# Patient Record
Sex: Female | Born: 1935 | Race: White | Hispanic: No | Marital: Single | Smoking: Never smoker
Health system: Southern US, Community
[De-identification: ages and names within clinical notes are randomized; demographics above are authoritative.]

## PROBLEM LIST (undated history)

## (undated) DIAGNOSIS — F32A Depression, unspecified: Secondary | ICD-10-CM

## (undated) DIAGNOSIS — K219 Gastro-esophageal reflux disease without esophagitis: Secondary | ICD-10-CM

## (undated) DIAGNOSIS — R200 Anesthesia of skin: Secondary | ICD-10-CM

## (undated) DIAGNOSIS — G9340 Encephalopathy, unspecified: Secondary | ICD-10-CM

## (undated) DIAGNOSIS — Z923 Personal history of irradiation: Secondary | ICD-10-CM

## (undated) DIAGNOSIS — R194 Change in bowel habit: Secondary | ICD-10-CM

## (undated) DIAGNOSIS — F419 Anxiety disorder, unspecified: Secondary | ICD-10-CM

## (undated) DIAGNOSIS — E7800 Pure hypercholesterolemia, unspecified: Secondary | ICD-10-CM

## (undated) DIAGNOSIS — J383 Other diseases of vocal cords: Secondary | ICD-10-CM

## (undated) DIAGNOSIS — A419 Sepsis, unspecified organism: Secondary | ICD-10-CM

## (undated) DIAGNOSIS — E876 Hypokalemia: Secondary | ICD-10-CM

## (undated) DIAGNOSIS — F329 Major depressive disorder, single episode, unspecified: Secondary | ICD-10-CM

## (undated) DIAGNOSIS — R6521 Severe sepsis with septic shock: Secondary | ICD-10-CM

## (undated) DIAGNOSIS — I2609 Other pulmonary embolism with acute cor pulmonale: Secondary | ICD-10-CM

## (undated) DIAGNOSIS — I2699 Other pulmonary embolism without acute cor pulmonale: Secondary | ICD-10-CM

## (undated) DIAGNOSIS — D4959 Neoplasm of unspecified behavior of other genitourinary organ: Secondary | ICD-10-CM

## (undated) DIAGNOSIS — W19XXXA Unspecified fall, initial encounter: Secondary | ICD-10-CM

## (undated) DIAGNOSIS — C801 Malignant (primary) neoplasm, unspecified: Secondary | ICD-10-CM

## (undated) DIAGNOSIS — Z85828 Personal history of other malignant neoplasm of skin: Secondary | ICD-10-CM

## (undated) DIAGNOSIS — A0472 Enterocolitis due to Clostridium difficile, not specified as recurrent: Secondary | ICD-10-CM

## (undated) DIAGNOSIS — R32 Unspecified urinary incontinence: Secondary | ICD-10-CM

## (undated) DIAGNOSIS — E039 Hypothyroidism, unspecified: Secondary | ICD-10-CM

## (undated) DIAGNOSIS — I10 Essential (primary) hypertension: Secondary | ICD-10-CM

## (undated) DIAGNOSIS — C20 Malignant neoplasm of rectum: Secondary | ICD-10-CM

## (undated) DIAGNOSIS — L719 Rosacea, unspecified: Secondary | ICD-10-CM

## (undated) DIAGNOSIS — Z87440 Personal history of urinary (tract) infections: Secondary | ICD-10-CM

## (undated) DIAGNOSIS — K6289 Other specified diseases of anus and rectum: Secondary | ICD-10-CM

## (undated) DIAGNOSIS — M1990 Unspecified osteoarthritis, unspecified site: Secondary | ICD-10-CM

## (undated) DIAGNOSIS — N390 Urinary tract infection, site not specified: Secondary | ICD-10-CM

## (undated) HISTORY — DX: Other pulmonary embolism without acute cor pulmonale: I26.99

## (undated) HISTORY — PX: OVARIAN CYST SURGERY: SHX726

## (undated) HISTORY — PX: ABDOMINAL HYSTERECTOMY: SHX81

## (undated) HISTORY — PX: CATARACT EXTRACTION: SUR2

## (undated) HISTORY — DX: Personal history of irradiation: Z92.3

## (undated) SURGERY — Surgical Case
Anesthesia: *Unknown

---

## 1999-05-14 ENCOUNTER — Encounter: Payer: Self-pay | Admitting: Internal Medicine

## 1999-05-14 ENCOUNTER — Encounter: Admission: RE | Admit: 1999-05-14 | Discharge: 1999-05-14 | Payer: Self-pay | Admitting: Family Medicine

## 1999-09-04 ENCOUNTER — Other Ambulatory Visit: Admission: RE | Admit: 1999-09-04 | Discharge: 1999-09-04 | Payer: Self-pay | Admitting: Internal Medicine

## 2000-01-28 ENCOUNTER — Emergency Department (HOSPITAL_COMMUNITY): Admission: EM | Admit: 2000-01-28 | Discharge: 2000-01-28 | Payer: Self-pay | Admitting: Emergency Medicine

## 2000-09-24 ENCOUNTER — Emergency Department (HOSPITAL_COMMUNITY): Admission: EM | Admit: 2000-09-24 | Discharge: 2000-09-24 | Payer: Self-pay | Admitting: Internal Medicine

## 2000-09-28 ENCOUNTER — Encounter: Admission: RE | Admit: 2000-09-28 | Discharge: 2000-09-28 | Payer: Self-pay | Admitting: Internal Medicine

## 2000-09-28 ENCOUNTER — Encounter: Payer: Self-pay | Admitting: Internal Medicine

## 2002-06-13 ENCOUNTER — Encounter: Payer: Self-pay | Admitting: Internal Medicine

## 2002-06-13 ENCOUNTER — Encounter: Admission: RE | Admit: 2002-06-13 | Discharge: 2002-06-13 | Payer: Self-pay | Admitting: Internal Medicine

## 2002-08-04 ENCOUNTER — Other Ambulatory Visit: Admission: RE | Admit: 2002-08-04 | Discharge: 2002-08-04 | Payer: Self-pay | Admitting: Internal Medicine

## 2004-01-17 ENCOUNTER — Encounter: Admission: RE | Admit: 2004-01-17 | Discharge: 2004-01-17 | Payer: Self-pay | Admitting: Internal Medicine

## 2004-04-21 ENCOUNTER — Inpatient Hospital Stay (HOSPITAL_COMMUNITY): Admission: RE | Admit: 2004-04-21 | Discharge: 2004-04-23 | Payer: Self-pay | Admitting: Obstetrics and Gynecology

## 2004-04-21 ENCOUNTER — Encounter (INDEPENDENT_AMBULATORY_CARE_PROVIDER_SITE_OTHER): Payer: Self-pay | Admitting: Specialist

## 2006-09-05 ENCOUNTER — Encounter: Admission: RE | Admit: 2006-09-05 | Discharge: 2006-09-05 | Payer: Self-pay | Admitting: Internal Medicine

## 2006-10-02 ENCOUNTER — Encounter (INDEPENDENT_AMBULATORY_CARE_PROVIDER_SITE_OTHER): Payer: Self-pay | Admitting: Emergency Medicine

## 2006-10-02 ENCOUNTER — Ambulatory Visit: Payer: Self-pay | Admitting: Vascular Surgery

## 2006-10-02 ENCOUNTER — Emergency Department (HOSPITAL_COMMUNITY): Admission: EM | Admit: 2006-10-02 | Discharge: 2006-10-02 | Payer: Self-pay | Admitting: Emergency Medicine

## 2007-03-11 ENCOUNTER — Inpatient Hospital Stay (HOSPITAL_COMMUNITY): Admission: EM | Admit: 2007-03-11 | Discharge: 2007-03-14 | Payer: Self-pay | Admitting: Emergency Medicine

## 2007-03-28 ENCOUNTER — Encounter: Admission: RE | Admit: 2007-03-28 | Discharge: 2007-04-27 | Payer: Self-pay | Admitting: Internal Medicine

## 2009-09-20 ENCOUNTER — Ambulatory Visit: Payer: Self-pay | Admitting: Interventional Radiology

## 2009-09-20 ENCOUNTER — Emergency Department (HOSPITAL_BASED_OUTPATIENT_CLINIC_OR_DEPARTMENT_OTHER): Admission: EM | Admit: 2009-09-20 | Discharge: 2009-09-20 | Payer: Self-pay | Admitting: Emergency Medicine

## 2009-10-12 ENCOUNTER — Ambulatory Visit (HOSPITAL_BASED_OUTPATIENT_CLINIC_OR_DEPARTMENT_OTHER): Admission: RE | Admit: 2009-10-12 | Discharge: 2009-10-12 | Payer: Self-pay | Admitting: Internal Medicine

## 2009-10-12 ENCOUNTER — Ambulatory Visit: Payer: Self-pay | Admitting: Diagnostic Radiology

## 2009-11-11 ENCOUNTER — Encounter: Admission: RE | Admit: 2009-11-11 | Discharge: 2009-11-11 | Payer: Self-pay | Admitting: Internal Medicine

## 2010-01-09 ENCOUNTER — Encounter
Admission: RE | Admit: 2010-01-09 | Discharge: 2010-01-09 | Payer: Self-pay | Source: Home / Self Care | Admitting: Internal Medicine

## 2010-03-25 ENCOUNTER — Other Ambulatory Visit: Payer: Self-pay | Admitting: Internal Medicine

## 2010-03-25 DIAGNOSIS — R1900 Intra-abdominal and pelvic swelling, mass and lump, unspecified site: Secondary | ICD-10-CM

## 2010-04-07 ENCOUNTER — Other Ambulatory Visit: Payer: Self-pay

## 2010-04-08 ENCOUNTER — Other Ambulatory Visit: Payer: Self-pay | Admitting: Internal Medicine

## 2010-04-08 DIAGNOSIS — R1900 Intra-abdominal and pelvic swelling, mass and lump, unspecified site: Secondary | ICD-10-CM

## 2010-04-14 ENCOUNTER — Ambulatory Visit
Admission: RE | Admit: 2010-04-14 | Discharge: 2010-04-14 | Disposition: A | Discharge status: Home / Self Care | Payer: BC Managed Care – PPO | Source: Ambulatory Visit | Attending: Internal Medicine | Admitting: Internal Medicine

## 2010-04-14 DIAGNOSIS — R1900 Intra-abdominal and pelvic swelling, mass and lump, unspecified site: Secondary | ICD-10-CM

## 2010-04-25 LAB — POCT CARDIAC MARKERS
Myoglobin, poc: 78.4 ng/mL (ref 12–200)
Troponin i, poc: <0.05 ng/mL (ref 0.00–0.09)

## 2010-04-25 LAB — CBC
HCT: 42.5 % (ref 36.0–46.0)
Hemoglobin: 14.2 g/dL (ref 12.0–15.0)
MCH: 30.9 pg (ref 26.0–34.0)

## 2010-04-25 LAB — URINALYSIS, ROUTINE W REFLEX MICROSCOPIC
Bilirubin Urine: NEGATIVE
Ketones, ur: 40 mg/dL — AB
Protein, ur: NEGATIVE mg/dL
Urobilinogen, UA: 1 mg/dL (ref 0.0–1.0)
pH: 6 (ref 5.0–8.0)

## 2010-04-25 LAB — URINE CULTURE
Colony Count: >=100000
Culture  Setup Time: 201108122110

## 2010-04-25 LAB — COMPREHENSIVE METABOLIC PANEL
ALT: 19 U/L (ref 0–35)
AST: 24 U/L (ref 0–37)
Alkaline Phosphatase: 97 U/L (ref 39–117)
CO2: 29 mEq/L (ref 19–32)
Glucose, Bld: 113 mg/dL — ABNORMAL HIGH (ref 70–99)
Sodium: 141 mEq/L (ref 135–145)

## 2010-04-25 LAB — URINE MICROSCOPIC-ADD ON

## 2010-04-25 LAB — DIFFERENTIAL: Basophils Relative: 1 % (ref 0–1)

## 2010-04-25 LAB — LACTIC ACID, PLASMA: Lactic Acid, Venous: 1.1 mmol/L (ref 0.5–2.2)

## 2010-06-24 NOTE — H&P (Signed)
NAME:  Bethany [name], TINNER NO.:  [id number]   MEDICAL RECORD NO.:  [id number]          PATIENT TYPE:  OBV   LOCATION:  5011                         FACILITY:  [facility]   PHYSICIAN:  Della Goo, M.D. DATE OF BIRTH:  04-15-1928   DATE OF ADMISSION:  03/11/2007  DATE OF DISCHARGE:                              HISTORY & PHYSICAL   PRIMARY CARE PHYSICIAN:  Dr. Selena Batten   CHIEF COMPLAINT:  Fall.   HISTORY OF PRESENT ILLNESS:  This is a 77 year old female who was  brought to the emergency department after suffering a fall and hitting  the back of her head on a brick wall.  She suffered a laceration to the  posterior scalp area.  The patient denies having any loss of  consciousness.  She denies having any dizziness or chest pain prior to  the event.  She reports tripping in the garage while she was trying to  help her nephew and fell backwards, hitting the brick wall.  She denies  having any current nausea, vomiting, dizziness.  She does report having  pain at the site of the wound.   The patient was seen and evaluated in the emergency department by the  emergency department physician and her wound was evaluated and she had a  CAT scan performed of the head which was negative.  The laceration was  apposed with suture staples and the patient was referred for 24-hour  observation/admission.   PAST MEDICAL HISTORY:  Significant for:  1. Hypertension.  2. Migraine headaches.  3. Rosacea.  4. Bilateral cataracts.  5. Arthritis.   PAST SURGICAL HISTORY:  History of right ovarian cyst removal secondary  to findings of a benign cyst.   MEDICATIONS:  Include Inderal 120 mg one p.o. daily.   She has allergies to:  1. CODEINE.  2. CEPHALOSPORINS.  3. ERYTHROMYCIN.   SOCIAL HISTORY:  She lives with her nephew.  She is a nonsmoker,  nondrinker.   FAMILY HISTORY:  Positive for coronary artery disease in her father and  her sister, positive for stroke in her father, and  positive for cancer  in her mother - this was a brain cancer.   REVIEW OF SYSTEMS:  Pertinents are mentioned above.   PHYSICAL EXAMINATION FINDINGS:  This is a pleasant 77 year old obese  female in discomfort but no acute distress.  VITAL SIGNS:  Temperature 97.0, blood pressure 188/92, heart rate 68,  respirations 20.  HEENT EXAMINATION:  Normocephalic.  Positive posterior scalp area  laceration approximately 5 cm in length which is currently apposed with  suture staples.  There is edema around the surrounding area and an  ecchymosis.  Pupils are equally round, reactive to light.  Extraocular  muscles are intact.  Oropharynx is clear.  NECK:  Supple, full range of motion.  No thyromegaly, adenopathy,  jugular venous distention.  CARDIOVASCULAR:  Regular rate and rhythm.  No murmurs, gallops or rubs.  LUNGS:  Clear to auscultation bilaterally.  ABDOMEN:  Positive bowel sounds, soft, nontender, nondistended.  EXTREMITIES:  Without cyanosis, clubbing or edema.  NEUROLOGIC  EXAMINATION:  Nonfocal.   LABORATORY STUDIES:  I-STAT performed:  Hemoglobin 13.6, hematocrit  40.0, sodium 141, potassium 3.1, chloride 105 bicarb 27, creatinine 0.8,  and glucose 95.  CT scan of the brain is negative for any intracranial  abnormalities.  Chronic small-vessel disease present.  CT scan of the C-  spine reveals no acute fractures, positive osteoarthritis changes  present throughout C-spine, without evidence of fracture or subluxation.   ASSESSMENT:  A 77 year old female being admitted with:  1. Concussion syndrome status post fall.  2. Scalp laceration.  3. Mild hypokalemia.  4. Hypertension, most probably exacerbated by her pain and injury at      this time.   PLAN:  The patient will be admitted for 23-hour observation.  She will  be placed on neurologic checks, pain control therapy, IV fluids.  Her  regular medications will also be continued.  Further evaluation will  ensue pending her clinical  course.  If her condition does change she  will be changed to admission status.  The patient has been placed on DVT  and GI prophylaxis at this time.      Della Goo, M.D.  Electronically Signed     HJ/MEDQ  D:  03/12/2007  T:  03/12/2007  Job:  [id number]   cc:   Massie Maroon, MD

## 2010-06-24 NOTE — Discharge Summary (Signed)
NAME:  Bethany [name], BARRESI NO.:  [id number]   MEDICAL RECORD NO.:  [id number]          PATIENT TYPE:  OBV   LOCATION:  5011                         FACILITY:  [facility]   PHYSICIAN:  Lucita Ferrara, MD         DATE OF BIRTH:  01-25-1929   DATE OF ADMISSION:  03/11/2007  DATE OF DISCHARGE:  03/14/2007                               DISCHARGE SUMMARY   PRIMARY CARE DOCTOR:  Massie Maroon, MD   ADMISSION DIAGNOSES:  1. Concussion syndrome, status post fall.  2. Scalp laceration.  3. Mild hypokalemia.  4. Hypertension.   DISCHARGE DIAGNOSES:  1. Dizziness secondary to non-central causes.  2. Scalp laceration.  3. Hypokalemia.  4. Chronic compensated diastolic dysfunction.  5. Deconditioning.   CONSULTANTS:  None.   PROCEDURES:  The patient had a CT scan initially when she presented on  January 30, which showed no acute intracranial abnormalities.  A repeat  CT scan was again done on March 13, 2007, which confirmed that  diagnosis.   BRIEF HISTORY OF PRESENT ILLNESS:  Ms. Bethany [name] is a 77 year old female  who presented to [facility] on March 11, 2007, after  sustaining a fall and hitting the back of her head at a brick wall.  She  sustained a laceration in the posterior scalp.  She denied any prodrome  such as dizziness, chest pain, shortness of breath, seizure activity,  loss of bowel or bladder function.  She came to the emergency room, was  evaluated, CT scan as above negative.  Laceration was sutured with  staples.  She was admitted for observation.  A repeat CT scan was  negative.  However, her dizziness persisted.  My suspicion is that the  dizziness was the initial culprit for the fall as the patient describes  that she has chronic vertigo.  In this regard, physical therapy and  occupational therapy were called for evaluation.  The patient is less  dizzy as time goes on.  She is able to ambulate better and she will most  likely benefit from PT for  cochlear rehabilitation as an outpatient.   DISCHARGE INSTRUCTIONS:  The patient will be discharged to home with her  son.   The patient will be on the following medications, including:  1. She was given a prescription for Antivert 25 mg q.6h. p.r.n.      dizziness.  2. She is to resume her Inderal 120 mg p.o. daily for migraine      headache prophylaxis.   Note that she is allergic to CODEINE, CEPHALOSPORINS and ERYTHROMYCIN.   She will be set up with home PT for cochlear exercises.  The patient is  instructed to call PMD or present to emergency room with changes in her  neurological status, numbness, tingling, speech deficit or signs or  symptoms of stroke.  If her dizziness continues, the patient is  recommended to follow up with neurology and/or ENT.  The patient may  benefit from MRI/MRA to rule out any central causes of dizziness  including acoustic neuroma or  Schwannoma.   Date of discharge, she is hemodynamically stable.  Blood pressure is  97.8, pulse 52, respirations 18, blood pressure 138/78.  Pulse oximetry  96% on room air.  HEENT:  Normocephalic, atraumatic.  Sclerae anicteric.  NECK:  Supple.  No JVD.  No carotid bruits.  PERRLA.  Extraocular muscles intact.  CARDIOVASCULAR:  S1, S2, regular rate and rhythm.  No murmurs, rubs,  clicks.  ABDOMEN:  Soft, nontender, nondistended.  Positive bowel sounds.  LUNGS:  Clear to auscultation bilaterally.  No rhonchi, rales or  wheezes.  She is able to ambulate without dizziness, fall.      Lucita Ferrara, MD  Electronically Signed     RR/MEDQ  D:  03/14/2007  T:  03/14/2007  Job:  [id number]

## 2010-06-27 NOTE — H&P (Signed)
NAME:  Bethany [name], Bethany [name] NO.:  [id number]   MEDICAL RECORD NO.:  [id number]           PATIENT TYPE:   LOCATION:                                [facility]   PHYSICIAN:  Randye Lobo, M.D.   DATE OF BIRTH:  Dec 26, 1928   DATE OF ADMISSION:  04/21/2004  DATE OF DISCHARGE:                                HISTORY & PHYSICAL   CHIEF COMPLAINT:  Vaginal swelling.   HISTORY OF PRESENT ILLNESS:  The patient is a 77 year old, gravida 90  Caucasian female, who presented to her primary care Belynda Pagaduan reporting  vaginal swelling.  The patient was then referred by Dr. Dimas Alexandria,  her physician, for evaluation of uterine prolapse.  The patient had also  been seen for a recent urinary tract infection and hematuria, and had a  normal renal ultrasound.  The patient has reported problems with  inconsistent leakage of urine with coughing and sneezing, in addition to  urgency and urge incontinence.  The patient initially had a trial of a  pessary and vaginal Premarin cream for treatment of her prolapse; however,  this was unsatisfactory.  The patient desires surgical treatment for the  prolapse.   PAST OBSTETRIC AND GYNECOLOGIC HISTORIES:  The patient is virginal.  Her  last Pap smear was performed in 2004, and was within normal limits.  Her  last mammogram was performed on April 09, 2004, and the results are pending  at the time of the surgery.  The patient experienced her menopause 20 years  ago, and did not take any hormone replacement therapy.   PAST MEDICAL HISTORY:  1.  Migraine headaches.  2.  Hypertension.  3.  Rosacea.  4.  Bilateral cataracts.  5.  Status post fall 7 years ago.  The patient suffered from a pneumothorax,      a spine fracture, and she continues to have pain in her left leg      following this accident.   PAST SURGICAL HISTORY:  Status post laparotomy with unilateral ovarian  cystectomy for a benign cyst.   MEDICATIONS:  1.  Inderal 120 mg  p.o. daily.  2.  Minocycline 100 mg p.o. daily.  3.  Noritate cream 1%.   ALLERGIES:  1.  CODEINE.  2.  CEPHALOSPORINS.  3.  ERYTHROMYCIN.   SOCIAL HISTORY:  The patient is single.  She is a retired [employer].  The patient's nephew lives with her, and he works for a [employer].  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  The patient denies the use of tobacco.   FAMILY HISTORY:  Positive for coronary artery disease in the patient's  father and sister.  The patient's sister died following bypass surgery.  Positive for stroke in the patient's father, which ultimately caused his  death.  Positive for brain cancer in the patient's mother.   REVIEW OF SYSTEMS:  The patient occasionally has fecal urgency.   PHYSICAL EXAMINATION:  VITAL SIGNS:  Weight is 172 pounds, height 5  feet,  6.5 inches, blood pressure 122/78.  GENERAL:  The patient is an elderly Caucasian female in no acute distress.  There is a slight tremor to her voice when she speaks.  NECK:  Negative for adenopathy and thyromegaly.  LUNGS:  Clear to auscultation bilaterally.  HEART:  S1 and S2 with irregular rate and rhythm.  No evidence of a murmur,  rub, or gallop.  ABDOMEN:  Soft, nontender, and without evidence of hepatosplenomegaly or  organomegaly.  PELVIC:  Normal external genitalia and urethra.  There is evidence of a  first degree cystocele, second degree uterine prolapse, and a first degree  rectocele.  The uterus is small and nontender, and no adnexal masses or  tenderness are appreciated.   IMPRESSION:  The patient is a 77 year old, gravida 0 female, with  symptomatic pelvic organ prolapse.   PLAN:  The patient will undergo a total vaginal hysterectomy with anterior  and posterior colporrhaphy.  Risks, benefits, and alternatives have been  discussed with the patient, who wishes to proceed.      BES/MEDQ  D:  04/20/2004  T:  04/20/2004  Job:   [id number]

## 2010-06-27 NOTE — Discharge Summary (Signed)
NAME:  Bethany [name], Bethany [name] NO.:  [id number]   MEDICAL RECORD NO.:  [id number]          PATIENT TYPE:  INP   LOCATION:  9306                          FACILITY:  [facility]   PHYSICIAN:  Randye Lobo, M.D.   DATE OF BIRTH:  December 04, 1928   DATE OF ADMISSION:  04/21/2004  DATE OF DISCHARGE:  04/23/2004                                 DISCHARGE SUMMARY   ADMISSION DIAGNOSIS:  Pelvic organ prolapse.   DISCHARGE DIAGNOSES:  Status post total vaginal hysterectomy with anterior  colporrhaphy.   SIGNIFICANT OPERATIONS AND PROCEDURES:  The patient underwent a total  vaginal hysterectomy with anterior colporrhaphy under the direction of Dr.  Conley Simmonds with the assistance of Dr. Lodema Hong on April 21, 2004 at the  [facility] of [place].   ADMISSION HISTORY AND PHYSICAL EXAMINATION:  The patient is a 77 year old  gravida 31 Caucasian female with known uterine prolapse, which was not  satisfactorily treated with a pessary and vaginal Estrogen cream.  The  patient was noted to have a first degree cystocele, second degree uterine  prolapse, and a first degree rectocele.  The uterus was small and nontender  and there were no adnexal masses nor tenderness appreciated.  The patient  wished for definitive surgical treatment of the prolapse and a plan was made  to proceed with a total vaginal hysterectomy with anterior and posterior  colporrhaphy.   HOSPITAL COURSE:  The patient was admitted on April 21, 2004, at which time  she underwent a total vaginal hysterectomy with an anterior colporrhaphy.  The surgical findings included a small uterus with a long cervix.  There  were some adhesions between the sigmoid colon and the left adnexal region.  There was second degree uterine prolapse, a first degree cystocele, and a  minimal rectocele appreciated with the patient under anesthesia.  The  surgery was uncomplicated and the estimated blood loss was 150 cc.   Postoperatively the  patient had an unremarkable surgical recovery.  Her pain  was controlled with a morphine PCA and Ketorolac, which was successfully  converted over to Percocet and ibuprofen on postoperative day #1.  The  patient was tolerating a regular diet by postoperative day #2.  She was  ambulating independently prior to her discharge.  She did receive both PAS  stockings and TED hose for DVT prophylaxis.   The patient was able to void after her Foley catheter was removed on  postoperative day #1.  She had no significant vaginal bleeding during her  hospitalization.   The patient's discharge hemoglobin was noted to be 11.8 and she was  tolerating this well.  Her final pathology report was pending at the time of  her discharge.   The patient was found to be in good condition and ready for discharge on  postoperative day #2.   DISCHARGE INSTRUCTIONS:  1.  Discharged to home.  2.  The patient will take the following medications:      1.  Percocet (5 mg/325 mg) one-two p.o. q.4-6h. p.r.n. pain.  2.  Ibuprofen 600 mg p.o. q.6h. p.r.n. pain.  3.  The patient will have decreased activity for the next six weeks.  4.  The patient will follow up in the office in four weeks for a      postoperative check or sooner as needed.  5.  The patient will call if she experiences any problems with fever, nausea      and vomiting, pain uncontrolled by her medication, difficulty voiding,      active vaginal bleeding, or any other concern.      BES/MEDQ  D:  05/20/2004  T:  05/20/2004  Job:  [id number]

## 2010-06-27 NOTE — Op Note (Signed)
NAME:  Bethany [name], Bethany [name] NO.:  [id number]   MEDICAL RECORD NO.:  [id number]          PATIENT TYPE:  INP   LOCATION:  9399                          [facility]ACILITY[facility]   PHYSICIAN:  Randye Lobo, M.D.   DATE OF BIRTH:  12/19/1928   DATE OF PROCEDURE:  04/21/2004  DATE OF DISCHARGE:                                 OPERATIVE REPORT   PREOPERATIVE DIAGNOSIS:  Pelvic organ prolapse.   POSTOPERATIVE DIAGNOSIS:  Pelvic organ prolapse.   PROCEDURE:  Total vaginal hysterectomy with anterior colporrhaphy.   SURGEON:  Randye Lobo, M.D.   ASSISTANT:  Luvenia Redden, M.D.   ANESTHESIA:  General endotracheal.   IV FLUIDS:  1400 cc lactated Ringer's.   ESTIMATED BLOOD LOSS:  150 cc.   URINE OUTPUT:  350 cc.   COMPLICATIONS:  None.   INDICATIONS FOR PROCEDURE:  The patient is a 77 year old gravida 26 Caucasian  female who presented to her primary care Briceida Rasberry, Dr. Dimas Alexandria,  reporting vaginal swelling.  The patient was diagnosed with uterine prolapse  and was sent for further gynecologic evaluation and treatment.  In the  office, the patient was noted to have second-degree uterine prolapse and a  small cystocele and rectocele.  The patient had inconsistent symptoms of  genuine stress incontinence, and she also reported urgency with urge  incontinence.  The patient was given a trial of Premarin Vaginal cream and a  pessary, which was unsatisfactory to her.  The patient desired surgical  treatment, and the plan was made to proceed with a total vaginal  hysterectomy and anterior and posterior colporrhaphy after the risks,  benefits, and alternatives were discussed with her.   FINDINGS:  Examination under anesthesia revealed a second-degree uterine  prolapse, a first-degree cystocele, and a minimal rectocele.  The uterus was  noted to be normal and small, with a long cervix.  There were adhesions  between the left adnexa and the sigmoid colon.  The left ovary was  noted to  be atrophic.  The right ovary was not visualized.   SPECIMENS:  The uterus was sent to pathology.   DESCRIPTION OF PROCEDURE:  The patient was reidentified in the preoperative  hold area.  She received clindamycin 900 mg IV for antibiotic prophylaxis.  She also received TED hose and PAS stockings for DVT prophylaxis.  In the  operating room, general endotracheal anesthesia was induced, and the patient  was then placed in the dorsal lithotomy position.  The vagina and perineum  were then sterilely prepped and draped, and a Foley catheter was placed  inside the bladder.   The procedure began by placing a single-tooth tenaculum across the anterior  and posterior cervical lip.  The cervix was then circumferentially injected  with 0.5% lidocaine with 1:200,000 of epinephrine.  The cervix was then  circumscribed with the scalpel, and the Mayo scissors was used to carry the  dissection down through the endopelvic fascia.  The posterior cul-de-sac was  entered first and was entered sharply, and a long weighted speculum was then  placed  in the posterior cul-de-sac.  Digital examination confirmed proper  entry into the cul-de-sac.  The uterosacral ligaments were then sequentially  clamped, divided, and suture ligated with transfixion sutures of 0 Vicryl  which were held.  The bladder was dissected anteriorly off of the lower  uterine segment, although the anterior cul-de-sac could not be entered  initially.  The cardinal ligaments were the clamped, sharply divided, and  suture ligated with 0 Vicryl.  At this time, the anterior cul-de-sac was  entered sharply, and again, digital exam confirmed proper entry into this  space.  A Deaver retractor was then placed in the anterior cul-de-sac to  retract the bladder out of the way.   Adhesions were encountered at this time between the sigmoid colon and the  left adnexal region.  These were lysed sharply with a Metzenbaum scissors  under  direct visualization.  This dissection was able to mobilize the  sigmoid colon away from the adnexal structures so that a Heaney clamp could  be placed across the adnexa on this side.  The pedicle was then sharply  divided and secured with a free tie of 0 Vicryl followed by a suture  ligature of the same.  This was held.  A Heaney clamp was then placed across  the adnexal structures on the patient's right-hand side.  It was similarly  sharply divided and again ligated with a free tie of 0 Vicryl followed by a  suture ligature of the same.  This suture was also held.  The specimen was  then sent to pathology.  There was some bleeding noted at this time near the  patient's left uterosacral ligament which was reinforced with a figure-of-  eight suture of 0 Vicryl.  The posterior cuff was also closed at this time  with a running locked suture of 0 Vicryl to create hemostasis.  A  culdoplasty suture was not performed at this time due to the adhesions  between the sigmoid colon and the left adnexal structure which have limited  the position and safety of tying this suture.  With hemostasis excellent at  this time, the cul-de-sac was closed after the upper pedicles were examined  and found to be hemostatic.  A pursestring suture of 2-0 Vicryl was placed,  and this was closed under direct visualization.   The anterior colporrhaphy was performed next.  Allis clamps were used to  mark the midline of the vagina which was injected with 0.5% lidocaine with  1:200,000 epinephrine.  The vagina was then incised vertically in the  midline, and the subvaginal tissue was dissected off of the overlying mucosa  bilaterally.  There was excellent support noted of the urethrovesical  junction.  The dissection therefore was continued to just below this level.  The cystocele was reduced by placing a series of vertical mattress sutures of 2-0 Vicryl.  Excess vaginal mucosa was trimmed, and the anterior vaginal  wall  and the vaginal cuff were then closed with a running locked suture of 2-  0 Vicryl.  Hemostasis was excellent.   The posterior vagina was evaluated at this time.  The perineum had excellent  support and a proper length.  There was a minimal high rectocele which was  appreciated upon rectal exam.  Due to the minimal nature of the rectocele  and the patient's lack of symptoms, a posterior colporrhaphy was not  performed.   This concluded the patient's surgery.  There were no complications.  All  needle, instrument, and  sponge counts were correct.  The patient was  escorted to the recovery room in stable and awake condition.      BES/MEDQ  D:  04/21/2004  T:  04/21/2004  Job:  [id number]   cc:   Janae Bridgeman. Eloise Harman., M.D.  [address]  [place]  [place] [address]  Fax: [phone]

## 2010-10-30 LAB — CBC
Hemoglobin: 12.9
MCHC: 34.2
RBC: 4.13
RDW: 12.4

## 2010-10-30 LAB — I-STAT 8, (EC8 V) (CONVERTED LAB)
BUN: 25 — ABNORMAL HIGH
Bicarbonate: 26.8 — ABNORMAL HIGH
Glucose, Bld: 95
Operator id: 161631
TCO2: 28
pCO2, Ven: 45.2

## 2010-10-30 LAB — BASIC METABOLIC PANEL
Calcium: 8.4
GFR calc non Af Amer: >60
Potassium: 3 — ABNORMAL LOW

## 2010-10-30 LAB — POCT I-STAT CREATININE: Operator id: 161631

## 2010-10-31 LAB — CBC
HCT: 38.4
HCT: 40.2
Hemoglobin: 13.7
MCHC: 34.1
MCV: 90.6
Platelets: 162
Platelets: 170
RBC: 4.44
RDW: 12.4
WBC: 5.6
WBC: 7

## 2010-10-31 LAB — BASIC METABOLIC PANEL
BUN: 16
Calcium: 8.7
GFR calc non Af Amer: >60
Sodium: 137

## 2010-10-31 LAB — COMPREHENSIVE METABOLIC PANEL
Alkaline Phosphatase: 72
BUN: 16
CO2: 30
Chloride: 104
Creatinine, Ser: 0.68
GFR calc non Af Amer: >60
Glucose, Bld: 99
Total Bilirubin: 1.3 — ABNORMAL HIGH

## 2010-10-31 LAB — MAGNESIUM: Magnesium: 2.1

## 2010-10-31 LAB — PHOSPHORUS: Phosphorus: 2.5

## 2010-11-21 LAB — CBC
HCT: 40.5
MCHC: 34.1
MCV: 91.1
Platelets: 206

## 2010-11-21 LAB — DIFFERENTIAL
Basophils Relative: 1
Eosinophils Absolute: 0.4
Eosinophils Relative: 6 — ABNORMAL HIGH
Monocytes Relative: 8
Neutrophils Relative %: 53

## 2010-11-21 LAB — BASIC METABOLIC PANEL
BUN: 26 — ABNORMAL HIGH
CO2: 30
Chloride: 107
Creatinine, Ser: 0.73
Potassium: 3.6

## 2010-11-21 LAB — CULTURE, BLOOD (ROUTINE X 2)

## 2011-06-07 ENCOUNTER — Encounter (HOSPITAL_BASED_OUTPATIENT_CLINIC_OR_DEPARTMENT_OTHER): Payer: Self-pay | Admitting: Emergency Medicine

## 2011-06-07 ENCOUNTER — Emergency Department (HOSPITAL_BASED_OUTPATIENT_CLINIC_OR_DEPARTMENT_OTHER)
Admission: EM | Admit: 2011-06-07 | Discharge: 2011-06-07 | Disposition: A | Discharge status: Home / Self Care | Payer: Medicare Other | Attending: Emergency Medicine | Admitting: Emergency Medicine

## 2011-06-07 DIAGNOSIS — I10 Essential (primary) hypertension: Secondary | ICD-10-CM | POA: Insufficient documentation

## 2011-06-07 DIAGNOSIS — K219 Gastro-esophageal reflux disease without esophagitis: Secondary | ICD-10-CM | POA: Insufficient documentation

## 2011-06-07 DIAGNOSIS — R269 Unspecified abnormalities of gait and mobility: Secondary | ICD-10-CM | POA: Insufficient documentation

## 2011-06-07 DIAGNOSIS — R21 Rash and other nonspecific skin eruption: Secondary | ICD-10-CM | POA: Insufficient documentation

## 2011-06-07 DIAGNOSIS — Z79899 Other long term (current) drug therapy: Secondary | ICD-10-CM | POA: Insufficient documentation

## 2011-06-07 DIAGNOSIS — E7800 Pure hypercholesterolemia, unspecified: Secondary | ICD-10-CM | POA: Insufficient documentation

## 2011-06-07 DIAGNOSIS — B029 Zoster without complications: Secondary | ICD-10-CM | POA: Insufficient documentation

## 2011-06-07 DIAGNOSIS — M25519 Pain in unspecified shoulder: Secondary | ICD-10-CM | POA: Insufficient documentation

## 2011-06-07 DIAGNOSIS — M79609 Pain in unspecified limb: Secondary | ICD-10-CM | POA: Insufficient documentation

## 2011-06-07 HISTORY — DX: Essential (primary) hypertension: I10

## 2011-06-07 HISTORY — DX: Gastro-esophageal reflux disease without esophagitis: K21.9

## 2011-06-07 HISTORY — DX: Pure hypercholesterolemia, unspecified: E78.00

## 2011-06-07 MED ORDER — VALACYCLOVIR HCL 1 G PO TABS: 1000.0000 mg | ORAL_TABLET | Freq: Three times a day (TID) | ORAL | Status: DC

## 2011-06-07 MED ORDER — HYDROCODONE-ACETAMINOPHEN 5-325 MG PO TABS: 1.0000 | ORAL_TABLET | Freq: Three times a day (TID) | ORAL | Status: AC | PRN

## 2011-06-07 NOTE — Discharge Instructions (Signed)
Shingles Shingles is caused by the same virus that causes chickenpox (varicella zoster virus or VZV). Shingles often occurs many years or decades after having chickenpox. That is why it is more common in adults older than 50 years. The virus reactivates and breaks out as an infection in a nerve root. SYMPTOMS   The initial feeling (sensations) may be pain. This pain is usually described as:   Burning.   Stabbing.   Throbbing.   Tingling in the nerve root.   A red rash will follow in a couple days. The rash may occur in any area of the body and is usually on one side (unilateral) of the body in a band or belt-like pattern. The rash usually starts out as very small blisters (vesicles). They will dry up after 7 to 10 days. This is not usually a significant problem except for the pain it causes.   Long-lasting (chronic) pain is more likely in an elderly person. It can last months to years. This condition is called postherpetic neuralgia.  Shingles can be an extremely severe infection in someone with AIDS, a weakened immune system, or with forms of leukemia. It can also be severe if you are taking transplant medicines or other medicines that weaken the immune system. TREATMENT  Your caregiver will often treat you with:  Antiviral drugs.   Anti-inflammatory drugs.   Pain medicines.  Bed rest is very important in preventing the pain associated with herpes zoster (postherpetic neuralgia). Application of heat in the form of a hot water bottle or electric heating pad or gentle pressure with the hand is recommended to help with the pain or discomfort. PREVENTION  A varicella zoster vaccine is available to help protect against the virus. The Food and Drug Administration approved the varicella zoster vaccine for individuals 19 years of age and older. HOME CARE INSTRUCTIONS   Cool compresses to the area of rash may be helpful.   Only take over-the-counter or prescription medicines for pain,  discomfort, or fever as directed by your caregiver.   Avoid contact with:   Babies.   Pregnant women.   Children with eczema.   Elderly people with transplants.   People with chronic illnesses, such as leukemia and AIDS.   If the area involved is on your face, you may receive a referral for follow-up to a specialist. It is very important to keep all follow-up appointments. This will help avoid eye complications, chronic pain, or disability.  SEEK IMMEDIATE MEDICAL CARE IF:   You develop any pain (headache) in the area of the face or eye. This must be followed carefully by your caregiver or ophthalmologist. An infection in part of your eye (cornea) can be very serious. It could lead to blindness.   You do not have pain relief from prescribed medicines.   Your redness or swelling spreads.   The area involved becomes very swollen and painful.   You have a fever.   You notice any red or painful lines extending away from the affected area toward your heart (lymphangitis).   Your condition is worsening or has changed.  Document Released: 01/26/2005 Document Revised: 01/15/2011 Document Reviewed: 12/31/2008 [facility] Patient Information 2012 [place], [place].  Take Tylenol for mild pain or the pain medicine prescribed for bad pain. See Dr. Selena Batten if not improved in a week

## 2011-06-07 NOTE — ED Notes (Signed)
Pt has pain and burning to left shoulder.  Pt also has several clusters of blisters to left arm and left shoulder.  Pt states they have been there for two weeks.  Blisters intact.  No drainage noted.  Pt seen at [facility] on Friday for cortisone shot.

## 2011-06-07 NOTE — ED Provider Notes (Signed)
History     CSN: [id number]  Arrival date & time 06/07/11  1247   First MD Initiated Contact with Patient 06/07/11 1332      Chief Complaint  Patient presents with  . Shoulder Pain  . Blister  . Rash    (Consider location/radiation/quality/duration/timing/severity/associated sxs/prior treatment) HPI Complains of left shoulder pain accompanied by a rash on left shoulder and left forearm onset 2 weeks ago. Treated with Tylenol without adequate pain relief pain is constant nothing makes symptoms better or worse no fever no injury no other associated symptoms pain is sharp in quality moderate to severe. No other complaint Past Medical History  Diagnosis Date  . Migraine   . GERD (gastroesophageal reflux disease)   . Hypertension   . Hypercholesteremia     Past Surgical History  Procedure Date  . Abdominal hysterectomy   . Ovarian cyst surgery     History reviewed. No pertinent family history.  History  Substance Use Topics  . Smoking status: Never Smoker   . Smokeless tobacco: Not on file  . Alcohol Use: No    OB History    Grav Para Term Preterm Abortions TAB SAB Ect Mult Living                  Review of Systems  Constitutional: Negative.   HENT: Negative.   Respiratory: Negative.   Cardiovascular: Negative.   Gastrointestinal: Negative.   Musculoskeletal: Positive for gait problem.       Walks with walker at baseline  Skin: Positive for rash.  Neurological: Negative.        Walks with walker at baseline  Hematological: Negative.   Psychiatric/Behavioral: Negative.   All other systems reviewed and are negative.    Allergies  Cephalosporins; Codeine; and Erythromycin  Home Medications   Current Outpatient Rx  Name Route Sig Dispense Refill  . OLMESARTAN-AMLODIPINE-HCTZ 40-5-12.5 MG PO TABS Oral Take 1 tablet by mouth daily.    Marland Kitchen PANTOPRAZOLE SODIUM 40 MG PO TBEC Oral Take 40 mg by mouth daily.    Marland Kitchen PRAVASTATIN SODIUM 20 MG PO TABS Oral Take 20 mg  by mouth daily.    Marland Kitchen RIZATRIPTAN BENZOATE 10 MG PO TABS Oral Take 10 mg by mouth as needed. May repeat in 2 hours if needed    . VITAMIN B-12 1000 MCG PO TABS Oral Take 1,000 mcg by mouth 2 (two) times daily.      BP 146/61  Pulse 72  Temp(Src) 98.2 F (36.8 C) (Oral)  Resp 16  SpO2 99%  Physical Exam  Nursing note and vitals reviewed. Constitutional: She appears well-developed and well-nourished.  HENT:  Head: Normocephalic and atraumatic.  Eyes: Conjunctivae are normal. Pupils are equal, round, and reactive to light.  Neck: Neck supple. No tracheal deviation present. No thyromegaly present.  Cardiovascular: Normal rate and regular rhythm.   No murmur heard. Pulmonary/Chest: Effort normal and breath sounds normal.  Abdominal: Soft. Bowel sounds are normal. She exhibits no distension. There is no tenderness.  Musculoskeletal: Normal range of motion. She exhibits no edema and no tenderness.  Neurological: She is alert. Coordination normal.  Skin: Skin is warm and dry. Rash noted.       Herpetiform rash over left deltoid area and left volar forearm  Psychiatric: She has a normal mood and affect.    ED Course  Procedures (including critical care time)  Labs Reviewed - No data to display No results found.   No diagnosis found.  MDM  Rash and symptoms consistent with herpes zoster Plan prescription Norco, Valcyclovir Followup Dr. Selena Batten as needed one week Diagnosis herpes zoster        Doug Sou, MD 06/07/11 [phone]

## 2012-01-01 ENCOUNTER — Emergency Department (HOSPITAL_BASED_OUTPATIENT_CLINIC_OR_DEPARTMENT_OTHER): Payer: Medicare Other

## 2012-01-01 ENCOUNTER — Encounter (HOSPITAL_BASED_OUTPATIENT_CLINIC_OR_DEPARTMENT_OTHER): Payer: Self-pay | Admitting: Family Medicine

## 2012-01-01 ENCOUNTER — Inpatient Hospital Stay (HOSPITAL_BASED_OUTPATIENT_CLINIC_OR_DEPARTMENT_OTHER)
Admission: EM | Admit: 2012-01-01 | Discharge: 2012-01-03 | DRG: 176 | Disposition: A | Discharge status: Home Health | Payer: Medicare Other | Attending: Internal Medicine | Admitting: Internal Medicine

## 2012-01-01 DIAGNOSIS — I10 Essential (primary) hypertension: Secondary | ICD-10-CM

## 2012-01-01 DIAGNOSIS — I9589 Other hypotension: Secondary | ICD-10-CM | POA: Diagnosis present

## 2012-01-01 DIAGNOSIS — E7800 Pure hypercholesterolemia, unspecified: Secondary | ICD-10-CM | POA: Diagnosis present

## 2012-01-01 DIAGNOSIS — Z86711 Personal history of pulmonary embolism: Secondary | ICD-10-CM

## 2012-01-01 DIAGNOSIS — I2789 Other specified pulmonary heart diseases: Secondary | ICD-10-CM | POA: Diagnosis present

## 2012-01-01 DIAGNOSIS — Z886 Allergy status to analgesic agent status: Secondary | ICD-10-CM

## 2012-01-01 DIAGNOSIS — G43909 Migraine, unspecified, not intractable, without status migrainosus: Secondary | ICD-10-CM | POA: Diagnosis present

## 2012-01-01 DIAGNOSIS — E785 Hyperlipidemia, unspecified: Secondary | ICD-10-CM

## 2012-01-01 DIAGNOSIS — E876 Hypokalemia: Secondary | ICD-10-CM | POA: Diagnosis present

## 2012-01-01 DIAGNOSIS — Z885 Allergy status to narcotic agent status: Secondary | ICD-10-CM

## 2012-01-01 DIAGNOSIS — I2699 Other pulmonary embolism without acute cor pulmonale: Principal | ICD-10-CM

## 2012-01-01 DIAGNOSIS — Z880 Allergy status to penicillin: Secondary | ICD-10-CM

## 2012-01-01 DIAGNOSIS — Z888 Allergy status to other drugs, medicaments and biological substances status: Secondary | ICD-10-CM

## 2012-01-01 DIAGNOSIS — Z881 Allergy status to other antibiotic agents status: Secondary | ICD-10-CM

## 2012-01-01 DIAGNOSIS — I2609 Other pulmonary embolism with acute cor pulmonale: Secondary | ICD-10-CM

## 2012-01-01 DIAGNOSIS — Z90710 Acquired absence of both cervix and uterus: Secondary | ICD-10-CM

## 2012-01-01 DIAGNOSIS — K219 Gastro-esophageal reflux disease without esophagitis: Secondary | ICD-10-CM

## 2012-01-01 DIAGNOSIS — T502X5A Adverse effect of carbonic-anhydrase inhibitors, benzothiadiazides and other diuretics, initial encounter: Secondary | ICD-10-CM | POA: Diagnosis present

## 2012-01-01 LAB — CBC WITH DIFFERENTIAL/PLATELET
Basophils Absolute: 0.1 10*3/uL (ref 0.0–0.1)
Basophils Relative: 1 % (ref 0–1)
Eosinophils Absolute: 0 10*3/uL (ref 0.0–0.7)
Eosinophils Relative: 0 % (ref 0–5)
Lymphs Abs: 0.7 10*3/uL (ref 0.7–4.0)
MCH: 31.4 pg (ref 26.0–34.0)
MCV: 87.9 fL (ref 78.0–100.0)
Neutrophils Relative %: 85 % — ABNORMAL HIGH (ref 43–77)
Platelets: 152 10*3/uL (ref 150–400)
RBC: 4.23 MIL/uL (ref 3.87–5.11)
RDW: 12.4 % (ref 11.5–15.5)

## 2012-01-01 LAB — COMPREHENSIVE METABOLIC PANEL
ALT: 14 U/L (ref 0–35)
AST: 18 U/L (ref 0–37)
Albumin: 4.1 g/dL (ref 3.5–5.2)
Alkaline Phosphatase: 93 U/L (ref 39–117)
Calcium: 9.9 mg/dL (ref 8.4–10.5)
GFR calc Af Amer: 68 mL/min — ABNORMAL LOW (ref >90)
Glucose, Bld: 110 mg/dL — ABNORMAL HIGH (ref 70–99)
Potassium: 3.3 mEq/L — ABNORMAL LOW (ref 3.5–5.1)
Sodium: 141 mEq/L (ref 135–145)
Total Protein: 7.2 g/dL (ref 6.0–8.3)

## 2012-01-01 LAB — TROPONIN I: Troponin I: 0.31 ng/mL (ref <0.30)

## 2012-01-01 LAB — PROTIME-INR
INR: 1.06 (ref 0.00–1.49)
Prothrombin Time: 13.7 seconds (ref 11.6–15.2)

## 2012-01-01 MED ORDER — LEVALBUTEROL HCL 0.63 MG/3ML IN NEBU
0.6300 mg | INHALATION_SOLUTION | Freq: Four times a day (QID) | RESPIRATORY_TRACT | Status: DC | PRN
  Filled 2012-01-01: qty 3

## 2012-01-01 MED ORDER — HYDROMORPHONE HCL PF 1 MG/ML IJ SOLN: 0.5000 mg | INTRAMUSCULAR | Status: DC | PRN

## 2012-01-01 MED ORDER — COUMADIN BOOK
Freq: Once | Status: DC
  Filled 2012-01-01 (×2): qty 1

## 2012-01-01 MED ORDER — PANTOPRAZOLE SODIUM 40 MG PO TBEC
40.0000 mg | DELAYED_RELEASE_TABLET | Freq: Every day | ORAL | Status: DC
  Administered 2012-01-01 – 2012-01-03 (×3): 40 mg via ORAL
  Filled 2012-01-01 (×3): qty 1

## 2012-01-01 MED ORDER — ONDANSETRON HCL 4 MG PO TABS: 4.0000 mg | ORAL_TABLET | Freq: Four times a day (QID) | ORAL | Status: DC | PRN

## 2012-01-01 MED ORDER — SODIUM CHLORIDE 0.9 % IJ SOLN
3.0000 mL | Freq: Two times a day (BID) | INTRAMUSCULAR | Status: DC
  Administered 2012-01-02 – 2012-01-03 (×3): 3 mL via INTRAVENOUS

## 2012-01-01 MED ORDER — VITAMIN B-12 1000 MCG PO TABS
1000.0000 ug | ORAL_TABLET | Freq: Every day | ORAL | Status: DC
  Administered 2012-01-01 – 2012-01-03 (×3): 1000 ug via ORAL
  Filled 2012-01-01 (×3): qty 1

## 2012-01-01 MED ORDER — VITAMIN D (ERGOCALCIFEROL) 1.25 MG (50000 UNIT) PO CAPS
50000.0000 [IU] | ORAL_CAPSULE | ORAL | Status: DC
  Administered 2012-01-01: 50000 [IU] via ORAL
  Filled 2012-01-01: qty 1

## 2012-01-01 MED ORDER — WARFARIN VIDEO
Freq: Once | Status: AC
  Administered 2012-01-01: 22:00:00

## 2012-01-01 MED ORDER — ACETAMINOPHEN 650 MG RE SUPP: 650.0000 mg | Freq: Four times a day (QID) | RECTAL | Status: DC | PRN

## 2012-01-01 MED ORDER — ACETAMINOPHEN 325 MG PO TABS
650.0000 mg | ORAL_TABLET | Freq: Four times a day (QID) | ORAL | Status: DC | PRN
  Administered 2012-01-02: 325 mg via ORAL
  Filled 2012-01-01: qty 1
  Filled 2012-01-01: qty 2

## 2012-01-01 MED ORDER — HEPARIN (PORCINE) IN NACL 100-0.45 UNIT/ML-% IJ SOLN
1100.0000 [IU]/h | INTRAMUSCULAR | Status: DC
  Administered 2012-01-01: 1200 [IU]/h via INTRAVENOUS
  Filled 2012-01-01 (×3): qty 250

## 2012-01-01 MED ORDER — IOHEXOL 350 MG/ML SOLN
100.0000 mL | Freq: Once | INTRAVENOUS | Status: AC | PRN
  Administered 2012-01-01: 100 mL via INTRAVENOUS

## 2012-01-01 MED ORDER — ONDANSETRON HCL 4 MG/2ML IJ SOLN: 4.0000 mg | Freq: Four times a day (QID) | INTRAMUSCULAR | Status: DC | PRN

## 2012-01-01 MED ORDER — SODIUM CHLORIDE 0.9 % IV SOLN
INTRAVENOUS | Status: AC
  Administered 2012-01-01: 18:00:00 via INTRAVENOUS

## 2012-01-01 MED ORDER — WARFARIN - PHARMACIST DOSING INPATIENT: Freq: Every day | Status: DC

## 2012-01-01 MED ORDER — WARFARIN SODIUM 5 MG PO TABS
5.0000 mg | ORAL_TABLET | Freq: Once | ORAL | Status: AC
  Administered 2012-01-01: 5 mg via ORAL
  Filled 2012-01-01: qty 1

## 2012-01-01 MED ORDER — HEPARIN BOLUS VIA INFUSION
4500.0000 [IU] | Freq: Once | INTRAVENOUS | Status: AC
  Administered 2012-01-01: 4500 [IU] via INTRAVENOUS

## 2012-01-01 NOTE — Progress Notes (Signed)
Received critical lab troponin .31, MD on call was notified. MD ordered to let him know what the next value is and no further orders were give. Pt asymptomatic, will continue to monitor.   Bethany [name]

## 2012-01-01 NOTE — ED Notes (Signed)
Pt sent here pov from Dr. Elmyra Ricks office for eval. Pt fell getting out of tub and hit head this morning. Hematoma to right side of head, shob upon arrival. Pt c/o shob for a few weeks.

## 2012-01-01 NOTE — Progress Notes (Signed)
ANTICOAGULATION CONSULT NOTE - Follow Up Consult  Pharmacy Consult for warfarin Indication: pulmonary embolus  Allergies  Allergen Reactions  . Cataflam (Diclofenac) Other (See Comments)    Unknown; patient is unaware of allergy.  . Cephalosporins Shortness Of Breath  . Erythromycin Shortness Of Breath  . Keflex (Cephalexin) Shortness Of Breath  . Codeine Nausea And Vomiting  . Darvocet (Propoxyphene-Acetaminophen) Other (See Comments)    Unknown; patient is unaware of allergy  . Naproxen Other (See Comments)    Unknown; patient is unaware of allergy  . Penicillins Other (See Comments)    Patient states that she CAN take this; unaware of allergy  . Synalgos-Dc (Aspirin-Caff-Dihydrocodeine) Other (See Comments)    Unknown; patient is unaware of allergy  . Ultram (Tramadol) Other (See Comments)    Unknown; patient is unaware of allergy    Patient Measurements: Height: 5\' 7"  (170.2 cm) Weight: 168 lb 14 oz (76.6 kg) IBW/kg (Calculated) : 61.6   Vital Signs: Temp: 97.9 F (36.6 C) (11/22 2049) Temp src: Oral (11/22 2049) BP: 136/65 mmHg (11/22 2049) Pulse Rate: 81  (11/22 2049)  Labs:  Basename 01/01/12 1923 01/01/12 1400  HGB -- 13.3  HCT -- 37.2  PLT -- 152  APTT -- --  LABPROT 13.7 --  INR 1.06 --  HEPARINUNFRC -- --  CREATININE -- 0.90  CKTOTAL -- --  CKMB -- --  TROPONINI -- <0.30    Estimated Creatinine Clearance: 52.3 ml/min (by C-G formula based on Cr of 0.9).  Assessment: 67 yof admitted with SOB and +D-dimer found to have a bilateral PE on CT. She was started on heparin earlier tonight and now adding coumadin. Her baseline INR is normal at 1.06.   Goal of Therapy:  INR 2-3   Plan:  1. Coumadin 5mg  PO x 1 tonight 2. Daily INR 3. Coumadin book + video to patient  Barry Faircloth, Drake Leach 01/01/2012,9:17 PM

## 2012-01-01 NOTE — ED Provider Notes (Addendum)
History     CSN: [id number]  Arrival date & time 01/01/12  1304   First MD Initiated Contact with Patient 01/01/12 1336      Chief Complaint  Patient presents with  . Fall  . Shortness of Breath    (Consider location/radiation/quality/duration/timing/severity/associated sxs/prior treatment) HPI Comments: The patient was in the tub this morning and fell.  She got up and sat on the toilet seat, then passed out.  She can't recall what had happened.  She had an appointment with her pcp this morning and told him what had happened.  She was sent here for evaluation.  She denies to me that she is any discomfort at this time.  There is no shortness of breath, chest pain, or abd pain.  No fevers or chills.  Patient is a 78 y.o. female presenting with fall. The history is provided by the patient.  Fall Incident onset: this morning. Incident: in the bathroom. She landed on a hard floor. There was no blood loss. The pain is moderate.    Past Medical History  Diagnosis Date  . Migraine   . GERD (gastroesophageal reflux disease)   . Hypertension   . Hypercholesteremia     Past Surgical History  Procedure Date  . Abdominal hysterectomy   . Ovarian cyst surgery     No family history on file.  History  Substance Use Topics  . Smoking status: Never Smoker   . Smokeless tobacco: Not on file  . Alcohol Use: No    OB History    Grav Para Term Preterm Abortions TAB SAB Ect Mult Living                  Review of Systems  All other systems reviewed and are negative.    Allergies  Cataflam; Cephalosporins; Codeine; Darvocet; Erythromycin; Keflex; Naproxen; Penicillins; Synalgos-dc; and Ultram  Home Medications   Current Outpatient Rx  Name  Route  Sig  Dispense  Refill  . OLMESARTAN-AMLODIPINE-HCTZ 40-5-12.5 MG PO TABS   Oral   Take 1 tablet by mouth daily.         Marland Kitchen PANTOPRAZOLE SODIUM 40 MG PO TBEC   Oral   Take 40 mg by mouth daily.         Marland Kitchen PRAVASTATIN SODIUM  20 MG PO TABS   Oral   Take 20 mg by mouth daily.         Marland Kitchen RIZATRIPTAN BENZOATE 10 MG PO TABS   Oral   Take 10 mg by mouth as needed. May repeat in 2 hours if needed         . VALACYCLOVIR HCL 1 G PO TABS   Oral   Take 1 tablet (1,000 mg total) by mouth 3 (three) times daily.   21 tablet   0   . VITAMIN B-12 1000 MCG PO TABS   Oral   Take 1,000 mcg by mouth 2 (two) times daily.           BP 145/72  Pulse 98  Temp 97.4 F (36.3 C) (Oral)  Resp 28  Ht 5\' 7"  (1.702 m)  Wt 169 lb (76.658 kg)  BMI 26.47 kg/m2  SpO2 97%  Physical Exam  Nursing note and vitals reviewed. Constitutional: She is oriented to person, place, and time. She appears well-developed and well-nourished. No distress.  HENT:  Head: Normocephalic and atraumatic.  Mouth/Throat: Oropharynx is clear and moist.  Neck: Normal range of motion. Neck supple.  Cardiovascular: Normal  rate and regular rhythm.   No murmur heard. Pulmonary/Chest: Effort normal and breath sounds normal. No respiratory distress. She has no wheezes.  Abdominal: Soft. Bowel sounds are normal. She exhibits no distension. There is no tenderness.  Musculoskeletal: Normal range of motion. She exhibits no edema.  Neurological: She is alert and oriented to person, place, and time. No cranial nerve deficit.  Skin: Skin is warm and dry. She is not diaphoretic.    ED Course  Procedures (including critical care time)   Labs Reviewed  CBC WITH DIFFERENTIAL  COMPREHENSIVE METABOLIC PANEL  D-DIMER, QUANTITATIVE  TROPONIN I   No results found.   No diagnosis found.   Date: 01/01/2012  Rate: 81  Rhythm: normal sinus rhythm  QRS Axis: normal  Intervals: normal  ST/T Wave abnormalities: normal  Conduction Disutrbances:none  Narrative Interpretation:   Old EKG Reviewed: unchanged    MDM  The ct shows bilateral pe's.  She will be started on heparin and transferred to [facility] for admission.  Dr. Jomarie Longs accepts.     CRITICAL  CARE Performed by: Geoffery Lyons   Total critical care time: 30 minutes  Critical care time was exclusive of separately billable procedures and treating other patients.  Critical care was necessary to treat or prevent imminent or life-threatening deterioration.  Critical care was time spent personally by me on the following activities: development of treatment plan with patient and/or surrogate as well as nursing, discussions with consultants, evaluation of patient's response to treatment, examination of patient, obtaining history from patient or surrogate, ordering and performing treatments and interventions, ordering and review of laboratory studies, ordering and review of radiographic studies, pulse oximetry and re-evaluation of patient's condition.      Geoffery Lyons, MD 01/01/12 1538  Geoffery Lyons, MD 01/01/12 1540

## 2012-01-01 NOTE — Progress Notes (Signed)
CRITICAL VALUE ALERT  Critical value received:  Troponin .31  Date of notification:  01/01/2012  Time of notification:  2145  Critical value read back:yes  Nurse who received alert:  Carlyle Lipa  MD notified (1st page):  Dr. Benedetto Coons  Time of first page:  2150  MD notified (2nd page):  Time of second page:  Responding MD:  Dr. Benedetto Coons   Time MD responded:  2153

## 2012-01-01 NOTE — Progress Notes (Signed)
ANTICOAGULATION CONSULT NOTE - Initial Consult  Pharmacy Consult for UFH Indication: B/L PE  Allergies  Allergen Reactions  . Cataflam (Diclofenac)   . Cephalosporins   . Codeine   . Darvocet (Propoxyphene-Acetaminophen)   . Erythromycin   . Keflex (Cephalexin)   . Naproxen   . Penicillins   . Synalgos-Dc (Aspirin-Caff-Dihydrocodeine)   . Ultram (Tramadol)     Patient Measurements: Height: 5\' 7"  (170.2 cm) Weight: 169 lb (76.658 kg) IBW/kg (Calculated) : 61.6   Vital Signs: Temp: 97.4 F (36.3 C) (11/22 1330) Temp src: Oral (11/22 1330) BP: 145/72 mmHg (11/22 1330) Pulse Rate: 98  (11/22 1330)  Labs:  Basename 01/01/12 1400  HGB 13.3  HCT 37.2  PLT 152  APTT --  LABPROT --  INR --  HEPARINUNFRC --  CREATININE 0.90  CKTOTAL --  CKMB --  TROPONINI <0.30    Estimated Creatinine Clearance: 52.3 ml/min (by C-G formula based on Cr of 0.9).   Medical History: Past Medical History  Diagnosis Date  . Migraine   . GERD (gastroesophageal reflux disease)   . Hypertension   . Hypercholesteremia     Medications:   (Not in a hospital admission)  Assessment: 78 y/o female patient admitted with shortness of breath and positive d-dimer, CT angio chest revealed bilateral PE requiring anticoagulation. Begin heparin, consider starting coumadin tonight as well.  Goal of Therapy:  Heparin level 0.3-0.7 units/ml Monitor platelets by anticoagulation protocol: Yes   Plan:  Heparin 4500 unit IV bolus followed by infusion at 1200 units/hr. Check 8 hour heparin level with daily cbc and heparin level.  Verlene Mayer, PharmD, BCPS Pager [phone] 01/01/2012,3:41 PM

## 2012-01-01 NOTE — H&P (Addendum)
[facility] History and Physical  Bethany [name] ZOX:[id number] DOB: [birth year] DOA: 01/01/2012  Referring physician: Geoffery Lyons, MD   PCP: Pearson Grippe, MD   Chief Complaint: Shortness of breath  HPI:  This is 78 year old female who was transferred from med center and [facility] where she presented with shortness of breath. She started feeling SOB yesterday at around 8:00 am  And started experiencing chest discomfort . Started gasping for breath and nearly passed out this morning.The patient had a fall this morning in the bath tub. After she got up she sat on the toilet seat and passed out. She does not remember for how long she was passed out. Her primary care provider sent  her to the ED for further evaluation. Denied any chest pain, she does describe mild shortness of breath. Denies any cough chills fever. Denies any recent weight loss or weight gain Evaluation in the ED showed bilateral pulmonary embolism next when she was refer to the [facility] for admission     Review of Systems: negative for the following  Constitutional: Denies fever, chills, diaphoresis, appetite change and fatigue.  HEENT: Denies photophobia, eye pain, redness, hearing loss, ear pain, congestion, sore throat, rhinorrhea, sneezing, mouth sores, trouble swallowing, neck pain, neck stiffness and tinnitus.  Respiratory: Denies SOB, DOE, cough, chest tightness, and wheezing.  Cardiovascular: Denies chest pain, palpitations and leg swelling.  Gastrointestinal: Denies nausea, vomiting, abdominal pain, diarrhea, constipation, blood in stool and abdominal distention.  Genitourinary: Denies dysuria, urgency, frequency, hematuria, flank pain and difficulty urinating.  Musculoskeletal: Denies myalgias, back pain, joint swelling, arthralgias and gait problem.  Skin: Denies pallor, rash and wound.  Neurological: Denies dizziness, seizures, syncope, weakness, light-headedness, numbness and headaches.  Hematological:  Denies adenopathy. Easy bruising, personal or family bleeding history  Psychiatric/Behavioral: Denies suicidal ideation, mood changes, confusion, nervousness, sleep disturbance and agitation       Past Medical History  Diagnosis Date  . Migraine   . GERD (gastroesophageal reflux disease)   . Hypertension   . Hypercholesteremia      Past Surgical History  Procedure Date  . Abdominal hysterectomy   . Ovarian cyst surgery       Social History:  reports that she has never smoked. She does not have any smokeless tobacco history on file. She reports that she does not drink alcohol. Her drug history not on file.    Allergies  Allergen Reactions  . Cataflam (Diclofenac) Other (See Comments)    Unknown; patient is unaware of allergy.  . Cephalosporins Shortness Of Breath  . Erythromycin Shortness Of Breath  . Keflex (Cephalexin) Shortness Of Breath  . Codeine Nausea And Vomiting  . Darvocet (Propoxyphene-Acetaminophen) Other (See Comments)    Unknown; patient is unaware of allergy  . Naproxen Other (See Comments)    Unknown; patient is unaware of allergy  . Penicillins Other (See Comments)    Patient states that she CAN take this; unaware of allergy  . Synalgos-Dc (Aspirin-Caff-Dihydrocodeine) Other (See Comments)    Unknown; patient is unaware of allergy  . Ultram (Tramadol) Other (See Comments)    Unknown; patient is unaware of allergy   Family history reviewed and found to be negative     Prior to Admission medications   Medication Sig Start Date End Date Taking? Authorizing Provider  Olmesartan-Amlodipine-HCTZ (TRIBENZOR) 40-5-12.5 MG TABS Take 1 tablet by mouth daily.   Yes Historical Provider, MD  vitamin B-12 (CYANOCOBALAMIN) 1000 MCG tablet Take 1,000 mcg by mouth daily.  Yes Historical Provider, MD  Vitamin D, Ergocalciferol, (DRISDOL) 50000 UNITS CAPS Take 50,000 Units by mouth every 7 (seven) days. On Saturday   Yes Historical Provider, MD     Physical  Exam: Filed Vitals:   01/01/12 1606 01/01/12 1609 01/01/12 1618 01/01/12 1700  BP: 146/73 147/98 147/98 147/66  Pulse: 95 92 92 93  Temp:   97.4 F (36.3 C) 97.2 F (36.2 C)  TempSrc:    Oral  Resp: 18  18 20   Height:    5\' 7"  (1.702 m)  Weight:    76.6 kg (168 lb 14 oz)  SpO2: 97% 99% 99% 97%     Constitutional: Vital signs reviewed. Patient is a well-developed and well-nourished in no acute distress and cooperative with exam. Alert and oriented x3.  Head: Normocephalic and atraumatic  Ear: TM normal bilaterally  Mouth: no erythema or exudates, MMM  Eyes: PERRL, EOMI, conjunctivae normal, No scleral icterus.  Neck: Supple, Trachea midline normal ROM, No JVD, mass, thyromegaly, or carotid bruit present.  Cardiovascular: RRR, S1 normal, S2 normal, no MRG, pulses symmetric and intact bilaterally  Pulmonary/Chest: CTAB, no wheezes, rales, or rhonchi  Abdominal: Soft. Non-tender, non-distended, bowel sounds are normal, no masses, organomegaly, or guarding present.  GU: no CVA tenderness Musculoskeletal: No joint deformities, erythema, or stiffness, ROM full and no nontender Ext: no edema and no cyanosis, pulses palpable bilaterally (DP and PT)  Hematology: no cervical, inginal, or axillary adenopathy.  Neurological: A&O x3, Strenght is normal and symmetric bilaterally, cranial nerve II-XII are grossly intact, no focal motor deficit, sensory intact to light touch bilaterally.  Skin: Warm, dry and intact. No rash, cyanosis, or clubbing.  Psychiatric: Normal mood and affect. speech and behavior is normal. Judgment and thought content normal. Cognition and memory are normal.       Labs on Admission:    Basic Metabolic Panel:  Lab 01/01/12 1610  NA 141  K 3.3*  CL 101  CO2 24  GLUCOSE 110*  BUN 22  CREATININE 0.90  CALCIUM 9.9  MG --  PHOS --   Liver Function Tests:  Lab 01/01/12 1400  AST 18  ALT 14  ALKPHOS 93  BILITOT 0.9  PROT 7.2  ALBUMIN 4.1   No results  found for this basename: LIPASE:5,AMYLASE:5 in the last 168 hours No results found for this basename: AMMONIA:5 in the last 168 hours CBC:  Lab 01/01/12 1400  WBC 7.4  NEUTROABS 6.3  HGB 13.3  HCT 37.2  MCV 87.9  PLT 152   Cardiac Enzymes:  Lab 01/01/12 1400  CKTOTAL --  CKMB --  CKMBINDEX --  TROPONINI <0.30    BNP (last 3 results) No results found for this basename: PROBNP:3 in the last 8760 hours    CBG: No results found for this basename: GLUCAP:5 in the last 168 hours  Radiological Exams on Admission: Dg Chest 2 View  01/01/2012  *RADIOLOGY REPORT*  Clinical Data: Shortness of breath.  Fall.  CHEST - 2 VIEW  Comparison: 08/04/2002  Findings: Lateral view degraded by patient arm position.  Moderate thoracic spondylosis.  Probable remote right rib trauma. Midline trachea.  Mild cardiomegaly. Mediastinal contours otherwise within normal limits. No pleural effusion or pneumothorax.  Mildly low lung volumes. Clear lungs.  IMPRESSION: Mild cardiomegaly and low lung volumes, without acute disease.   Original Report Authenticated By: Jeronimo Greaves, M.D.    Ct Angio Chest Pe W/cm &/or Wo Cm  01/01/2012  *RADIOLOGY REPORT*  Clinical  Data: Short of breath.  Chest pain.  CT ANGIOGRAPHY CHEST  Technique:  Multidetector CT imaging of the chest using the standard protocol during bolus administration of intravenous contrast. Multiplanar reconstructed images including MIPs were obtained and reviewed to evaluate the vascular anatomy.  Contrast: OMNIPAQUE IOHEXOL 350 MG/ML SOLN  Comparison: None.  Findings: Significant bilateral pulmonary thromboembolism is present.  There is low density filling defects in segmental branches of the right upper lobe, descending branch of the right lower lobe pulmonary artery, extension into the segmental branches of the right lower lobe pulmonary artery, left upper lobe segmental branches, left lower lobe segmental branches.  The main right and left  pulmonary arteries are relatively clear of pulmonary emboli.  No evidence of aortic dissection or aneurysm.  Minimal calcification of the aortic valve.  Mild atherosclerotic changes of the aortic arch.  Negative abnormal mediastinal adenopathy.  No pericardial effusion.  No pneumothorax.  No pleural effusion.  Dependent atelectasis in the lungs.  Chronic appearing right-sided rib deformities.  No definite acute rib fracture.  Chronic wedging of T11.  IMPRESSION: Study is positive for bilateral segmental and low bar acute pulmonary thromboembolism. Critical Value/emergent results were called by telephone at the time of interpretation on 01/01/2012 at 1524 hours to Dr. Judd Lien, who verbally acknowledged these results.   Original Report Authenticated By: Jolaine Click, M.D.     EKG: Independently reviewed.  Date: 01/01/2012  Rate: 81  Rhythm: normal sinus rhythm  QRS Axis: normal  Intervals: normal  ST/T Wave abnormalities: normal  Conduction Disutrbances:none  Narrative Interpretation:  Old EKG Reviewed: unchanged   Assessment/Plan Active Problems:  Pulmonary embolism  Gastroesophageal reflux disease  Hypertension  Dyslipidemia   1. Bilateral pulmonary embolism patient was admitted to telemetry, we'll cycle cardiac enzymes, the patient has been started on heparin drip, she denies any history of GI bleeding, she's not anemic, she may be transitioned to Lovenox tomorrow, Coumadin will be started tonight, we'll show her the Coumadin video and have pharmacy dose of Coumadin. At her age underlying malignancy is a concern however the patient does not give any such history 2. Hypertension blood pressure stable at this point will hold Norvasc/HCTZ combination 3. Hypokalemia replete 4.  Gastroesophageal reflux disease will start her on ppi  Code Status:   full Family Communication: bedside Disposition Plan: admit   Time spent: 70 mins   [facility] Pager [phone]  If  7PM-7AM, please contact night-coverage [email or web address] Password [facility] 01/01/2012, 7:45 PM

## 2012-01-01 NOTE — Progress Notes (Signed)
Called by Dr. Judd Lien 83/F sent to ED from PCP office with syncope and DYspnea CTA with B/l PE Hemodynamically stable, started on heparin gtt Accepted to Tele, team 10  Zannie Cove, MD

## 2012-01-01 NOTE — ED Notes (Signed)
Patient resting, shallow resp. BBS CTA decreased SpO2 85% with good waveform.  Patient states she feels SHOB, placed on 2l/m O2

## 2012-01-02 DIAGNOSIS — K219 Gastro-esophageal reflux disease without esophagitis: Secondary | ICD-10-CM

## 2012-01-02 DIAGNOSIS — I2609 Other pulmonary embolism with acute cor pulmonale: Secondary | ICD-10-CM

## 2012-01-02 DIAGNOSIS — I10 Essential (primary) hypertension: Secondary | ICD-10-CM

## 2012-01-02 DIAGNOSIS — E785 Hyperlipidemia, unspecified: Secondary | ICD-10-CM

## 2012-01-02 HISTORY — DX: Other pulmonary embolism with acute cor pulmonale: I26.09

## 2012-01-02 LAB — CBC
Hemoglobin: 12.2 g/dL (ref 12.0–15.0)
MCH: 30.7 pg (ref 26.0–34.0)
MCHC: 34.4 g/dL (ref 30.0–36.0)
MCV: 89.4 fL (ref 78.0–100.0)
RBC: 3.97 MIL/uL (ref 3.87–5.11)

## 2012-01-02 LAB — BASIC METABOLIC PANEL
BUN: 20 mg/dL (ref 6–23)
CO2: 24 mEq/L (ref 19–32)
GFR calc non Af Amer: 65 mL/min — ABNORMAL LOW (ref >90)
Glucose, Bld: 100 mg/dL — ABNORMAL HIGH (ref 70–99)
Potassium: 3.2 mEq/L — ABNORMAL LOW (ref 3.5–5.1)

## 2012-01-02 LAB — HEPARIN LEVEL (UNFRACTIONATED): Heparin Unfractionated: 0.86 IU/mL — ABNORMAL HIGH (ref 0.30–0.70)

## 2012-01-02 MED ORDER — WARFARIN SODIUM 5 MG PO TABS
5.0000 mg | ORAL_TABLET | Freq: Once | ORAL | Status: AC
  Administered 2012-01-02: 5 mg via ORAL
  Filled 2012-01-02: qty 1

## 2012-01-02 MED ORDER — POTASSIUM CHLORIDE CRYS ER 20 MEQ PO TBCR
60.0000 meq | EXTENDED_RELEASE_TABLET | Freq: Once | ORAL | Status: AC
  Administered 2012-01-02: 60 meq via ORAL
  Filled 2012-01-02: qty 3

## 2012-01-02 MED ORDER — ENOXAPARIN (LOVENOX) PATIENT EDUCATION KIT
PACK | Freq: Once | Status: AC
  Administered 2012-01-02: 07:00:00
  Filled 2012-01-02: qty 1

## 2012-01-02 MED ORDER — ENOXAPARIN SODIUM 80 MG/0.8ML ~~LOC~~ SOLN
80.0000 mg | Freq: Two times a day (BID) | SUBCUTANEOUS | Status: DC
  Administered 2012-01-02 – 2012-01-03 (×3): 80 mg via SUBCUTANEOUS
  Filled 2012-01-02 (×5): qty 0.8

## 2012-01-02 NOTE — Progress Notes (Signed)
  Echocardiogram 2D Echocardiogram has been performed.  Bethany [name] 01/02/2012, 11:15 AM

## 2012-01-02 NOTE — Progress Notes (Addendum)
ANTICOAGULATION CONSULT NOTE - Follow Up Consult  Pharmacy Consult for heparin>>lovenox, Coumadin Indication: pulmonary embolus  Labs:  [facility] 01/02/12 01/01/12 1923 01/01/12 1400  HGB 12.2 -- 13.3  HCT 35.5* -- 37.2  PLT 155 -- 152  APTT -- -- --  LABPROT 13.9 13.7 --  INR 1.08 1.06 --  HEPARINUNFRC 0.86* -- --  CREATININE 0.82 -- 0.90  CKTOTAL -- -- --  CKMB -- -- --  TROPONINI <0.30 0.31* <0.30    Assessment: 78yo female to change to lovenox for PE.  Her heparin level was elevated earlier today and heparin rate adjusted.  Will dc heparin drip and start lovenox 1 hr later. INR today 1.08  Goal of Therapy:  4 hr heparin level 0.6-1.2 units/ml  INR 2-3  Plan:  D/C heparin. Lovenox 80 mg sq q12 hours. Check CBC q 3 days.  Coumadin 5 mg again today Daily PT/INR  Woodfin Ganja PharmD 01/02/2012,7:25 AM

## 2012-01-02 NOTE — Progress Notes (Signed)
Pt ambulated on room air 250 feet approximately.  Pt O2 sats 91-94% on room air while ambulating. Pt reports some dyspnea on exertion but overall tolerated well. Bethany [name]

## 2012-01-02 NOTE — Progress Notes (Signed)
ANTICOAGULATION CONSULT NOTE - Follow Up Consult  Pharmacy Consult for heparin Indication: pulmonary embolus  Labs:  [facility] 01/02/12 01/01/12 1923 01/01/12 1400  HGB 12.2 -- 13.3  HCT 35.5* -- 37.2  PLT 155 -- 152  APTT -- -- --  LABPROT 13.9 13.7 --  INR 1.08 1.06 --  HEPARINUNFRC 0.86* -- --  CREATININE -- -- 0.90  CKTOTAL -- -- --  CKMB -- -- --  TROPONINI -- 0.31* <0.30    Assessment: 78yo female supratherapeutic on heparin with initial dosing for PE.  Goal of Therapy:  Heparin level 0.3-0.7 units/ml   Plan:  Will decrease heparin gtt slightly to 1100 units/hr and check level with next CE.  Colleen Can PharmD BCPS 01/02/2012,1:07 AM

## 2012-01-02 NOTE — Discharge Summary (Addendum)
Physician Discharge Summary  KYIA RHUDE ZOX:[id number] DOB: Nov 06, 1928 DOA: 01/01/2012  PCP: Pearson Grippe, MD  Admit date: 01/01/2012 Discharge date: 01/03/2012  Recommendations for Outpatient Follow-up:  1. Follow up with primary care doctor within 3 days for INR check and to make sure cancer screening is up to date. 2. Home health RN to assist with instructions on lovenox administration.  Please have INR and CBC on Wednesday drawn by home health RN and sent to Dr. Elmyra Ricks office for review, office phone is [phone].    Discharge Diagnoses:  Active Problems:  Pulmonary embolism  Gastroesophageal reflux disease  Hypertension  Dyslipidemia  Cor pulmonale, acute   Discharge Condition: stable, improved  Diet recommendation: healthy heart  Wt Readings from Last 3 Encounters:  01/01/12 76.6 kg (168 lb 14 oz)    History of present illness:   This is 78 year old female who was transferred from med center and [facility] where she presented with shortness of breath. She started feeling SOB yesterday at around 8:00 am And started experiencing chest discomfort . Started gasping for breath and nearly passed out this morning.The patient had a fall this morning in the bath tub. After she got up she sat on the toilet seat and passed out. She does not remember for how long she was passed out. Her primary care provider sent her to the ED for further evaluation. Denied any chest pain, she does describe mild shortness of breath. Denies any cough chills fever. Denies any recent weight loss or weight gain   Evaluation in the ED showed bilateral pulmonary embolism next when she was refer to the [place] for admission   Hospital Course:   1. Bilateral pulmonary embolism with relative hypotension and evidence of cor pulmonale and acute RV dilatation and strain consistent with severe pulmonary hypertension.  Spoke with critical care Dr. Kathy Breach regarding the possibility of tPA, however, they  recommended against as she has been on room air and she is not in shock.  She started coumadin on 11/22 with heparin/lovenox bridge.  She should follow up with her primary care doctor in 2-3 days for an INR check and coumadin adjustment as needed.  Additionally, please make sure she is up to date on her cancer screening if not already done.  She had been lying around due to malaise a little more than usual over the days preceding her admission, but she also gives a history of progressive severe fatigue and some weight loss.    2. Hypertension blood pressure stable off of BP medications.  3. Hypokalemia, likely due to diuretic.  Her blood pressure medications were discontinued and she received oral repletion of potassium.    4. Elevated troponin to 0.31 x 1. Likely secondary to right heart strain from acute PE. Trended down and will not continue to check.  No tpa administered.  See above.    5. Gastroesophageal reflux disease, stable, continued ppi.     Procedures:  CTa chest 11/22  ECHO 11/23  Consultations:  None  Discharge Exam: Filed Vitals:   01/03/12 1331  BP: 125/52  Pulse: 68  Temp: 97.6 F (36.4 C)  Resp: 18   Filed Vitals:   01/02/12 1320 01/02/12 2025 01/03/12 0415 01/03/12 1331  BP: 110/50 134/70 109/69 125/52  Pulse: 69 66 65 68  Temp: 98 F (36.7 C) 98.8 F (37.1 C) 98.7 F (37.1 C) 97.6 F (36.4 C)  TempSrc: Oral Oral Oral Oral  Resp: 18 20 19  18  Height:      Weight:      SpO2: 91% 92% 93% 99%    General: Caucasian female, no acute distress, tearful when discussing possibility of injections HEENT:  MMM Cardiovascular: RRR, no mrg, 2+ pulses Respiratory:  Rales at bilateral bases that clear with repeat respirations ABD:  NABS, soft, nondistended, nontender, no organomegaly MSK:  No LEE or calf swelling  Discharge Instructions      Discharge Orders    Future Orders Please Complete By Expires   Diet - low sodium heart healthy      Increase  activity slowly      Call MD for:  temperature >100.4      Call MD for:  persistant nausea and vomiting      Call MD for:  severe uncontrolled pain      Call MD for:  difficulty breathing, headache or visual disturbances      Call MD for:  persistant dizziness or light-headedness      Call MD for:  extreme fatigue      (HEART FAILURE PATIENTS) Call MD:  Anytime you have any of the following symptoms: 1) 3 pound weight gain in 24 hours or 5 pounds in 1 week 2) shortness of breath, with or without a dry hacking cough 3) swelling in the hands, feet or stomach 4) if you have to sleep on extra pillows at night in order to breathe.      Discharge instructions      Comments:   You were hospitalized with a pulmonary embolism, a blood clot in the lungs.  You were started on coumadin, a blood thinner which you will need to take for at least 6 months.  You should use lovenox for the next 6 days to thin your blood while the coumadin gets to adequate levels.  Your next dose of lovenox will be tomorrow morning around 8AM.  You should take your next dose of coumadin Monday evening.  Please take three 2.5mg  tabs of coumadin which equals 7.5mg  total.  Please have follow up with Dr. Selena Batten on Wednesday for an INR (coumadin level) and CBC check, your home health nurse may be able to draw this lab for you and send the results to Dr. Elmyra Ricks office.  Please make sure your cancer screening is up to date. Follow up with Dr. Jacinto Halim regarding your right heart strain.  Please stop taking your blood pressure medication because your blood pressure is now normal on no medications, likely because of your pulmonary embolism.  Please weigh yourself daily first thing in the morning with the same clothes and if you gain more than 3-lbs in 24 hours or 5-lbs in 1 week, call your primary care doctor.       Medication List     As of 01/03/2012  7:34 PM    STOP taking these medications         TRIBENZOR 40-5-12.5 MG Tabs   Generic drug:  Olmesartan-Amlodipine-HCTZ      TAKE these medications         enoxaparin 80 MG/0.8ML injection   Commonly known as: LOVENOX   Inject 0.8 mLs (80 mg total) into the skin every 12 (twelve) hours.      vitamin B-12 1000 MCG tablet   Commonly known as: CYANOCOBALAMIN   Take 1,000 mcg by mouth daily.      Vitamin D (Ergocalciferol) 50000 UNITS Caps   Commonly known as: DRISDOL   Take 50,000 Units by mouth every  7 (seven) days. On Saturday      warfarin 2.5 MG tablet   Commonly known as: COUMADIN   Take 3 tablets (7.5 mg total) by mouth every evening.         Follow-up Information    Follow up with Pearson Grippe, MD. Schedule an appointment as soon as possible for a visit on 01/06/2012. (INR check, cancer screening up to date)    Contact information:   [address] [place] [address] [phone]           The results of significant diagnostics from this hospitalization (including imaging, microbiology, ancillary and laboratory) are listed below for reference.    Significant Diagnostic Studies: Dg Chest 2 View  01/01/2012  *RADIOLOGY REPORT*  Clinical Data: Shortness of breath.  Fall.  CHEST - 2 VIEW  Comparison: 08/04/2002  Findings: Lateral view degraded by patient arm position.  Moderate thoracic spondylosis.  Probable remote right rib trauma. Midline trachea.  Mild cardiomegaly. Mediastinal contours otherwise within normal limits. No pleural effusion or pneumothorax.  Mildly low lung volumes. Clear lungs.  IMPRESSION: Mild cardiomegaly and low lung volumes, without acute disease.   Original Report Authenticated By: Jeronimo Greaves, M.D.    Ct Angio Chest Pe W/cm &/or Wo Cm  01/01/2012  *RADIOLOGY REPORT*  Clinical Data: Donovin Kraemer of breath.  Chest pain.  CT ANGIOGRAPHY CHEST  Technique:  Multidetector CT imaging of the chest using the standard protocol during bolus administration of intravenous contrast. Multiplanar reconstructed images including MIPs were  obtained and reviewed to evaluate the vascular anatomy.  Contrast: OMNIPAQUE IOHEXOL 350 MG/ML SOLN  Comparison: None.  Findings: Significant bilateral pulmonary thromboembolism is present.  There is low density filling defects in segmental branches of the right upper lobe, descending branch of the right lower lobe pulmonary artery, extension into the segmental branches of the right lower lobe pulmonary artery, left upper lobe segmental branches, left lower lobe segmental branches.  The main right and left pulmonary arteries are relatively clear of pulmonary emboli.  No evidence of aortic dissection or aneurysm.  Minimal calcification of the aortic valve.  Mild atherosclerotic changes of the aortic arch.  Negative abnormal mediastinal adenopathy.  No pericardial effusion.  No pneumothorax.  No pleural effusion.  Dependent atelectasis in the lungs.  Chronic appearing right-sided rib deformities.  No definite acute rib fracture.  Chronic wedging of T11.  IMPRESSION: Study is positive for bilateral segmental and low bar acute pulmonary thromboembolism. Critical Value/emergent results were called by telephone at the time of interpretation on 01/01/2012 at 1524 hours to Dr. Judd Lien, who verbally acknowledged these results.   Original Report Authenticated By: Jolaine Click, M.D.     Microbiology: No results found for this or any previous visit (from the past 240 hour(s)).   Labs: Basic Metabolic Panel:  Lab 01/02/12 56/21/30 1400  NA 140 141  K 3.2* 3.3*  CL 102 101  CO2 24 24  GLUCOSE 100* 110*  BUN 20 22  CREATININE 0.82 0.90  CALCIUM 9.4 9.9  MG -- --  PHOS -- --   Liver Function Tests:  Lab 01/01/12 1400  AST 18  ALT 14  ALKPHOS 93  BILITOT 0.9  PROT 7.2  ALBUMIN 4.1   No results found for this basename: LIPASE:5,AMYLASE:5 in the last 168 hours No results found for this basename: AMMONIA:5 in the last 168 hours CBC:  Lab 01/03/12 0504 01/02/12 01/01/12 1400  WBC 5.1 6.4 7.4    NEUTROABS -- --  6.3  HGB 12.0 12.2 13.3  HCT 34.8* 35.5* 37.2  MCV 89.7 89.4 87.9  PLT 147* 155 152   Cardiac Enzymes:  Lab 01/02/12 0900 01/02/12 01/01/12 1923 01/01/12 1400  CKTOTAL -- -- -- --  CKMB -- -- -- --  CKMBINDEX -- -- -- --  TROPONINI <0.30 <0.30 0.31* <0.30   BNP: BNP (last 3 results) No results found for this basename: PROBNP:3 in the last 8760 hours CBG: No results found for this basename: GLUCAP:5 in the last 168 hours  Time coordinating discharge: 45 minutes  Signed:  SHORTThea Silversmith  [facility] 01/03/2012, 7:34 PM

## 2012-01-02 NOTE — Progress Notes (Signed)
[facility] PROGRESS NOTE  Bethany [name] YQM:[id number] DOB: 1928-07-04 DOA: 01/01/2012 PCP: Pearson Grippe, MD  Assessment/Plan:  1. Bilateral pulmonary embolism with relative hypoTN and evidence of cor pulmonale and acute RV dilatation and strain consistent with severe pulmonary hypertension. -  Continue coumadin and lovenox -  F/u with PCP for malignancy evaluation -  Patient ambulated without significant desaturation -  Patient and family unable to demonstrate lovenox technique after teaching    2. Hypertension blood pressure stable, hold BP medications  3. Hypokalemia, likely due to diuretic -  Oral repletion  4. Elevated troponin to 0.31 x 1.  Likely secondary to right heart strain from acute PE.  Trended down and will not continue to check.  5. Gastroesophageal reflux disease, stable, continue ppi  DIET:  Healthy heart ACCESS:  PIV IVF:  None PROPH:  lovenox/coumadin  Code Status: full code Family Communication: spoke with patient  Disposition Plan: likely to home pending family able to comply with a/c at home   Consultants:  none  Procedures:  CTa  ECHO  Antibiotics:  none  HPI/Subjective:  Denies chest pain, nausea, vomiting, diarrhea, constipation this morning.  Has some dypsnea on exertion this morning.    Objective: Filed Vitals:   01/01/12 1700 01/01/12 2049 01/02/12 0435 01/02/12 1320  BP: 147/66 136/65 115/59 110/50  Pulse: 93 81 65 69  Temp: 97.2 F (36.2 C) 97.9 F (36.6 C) 97.9 F (36.6 C) 98 F (36.7 C)  TempSrc: Oral Oral Oral Oral  Resp: 20 18 19 18   Height: 5\' 7"  (1.702 m)     Weight: 76.6 kg (168 lb 14 oz)     SpO2: 97% 94% 95% 91%    Intake/Output Summary (Last 24 hours) at 01/02/12 2005 Last data filed at 01/02/12 1320  Gross per 24 hour  Intake    120 ml  Output      0 ml  Net    120 ml   Filed Weights   01/01/12 1330 01/01/12 1700  Weight: 76.658 kg (169 lb) 76.6 kg (168 lb 14 oz)    Exam:  General: Caucasian  female, no acute distress  HEENT: MMM  Cardiovascular: RRR, no mrg, 2+ pulses  Respiratory: CTAB  ABD: NABS, soft, nondistended, nontender, no organomegaly  MSK: No LEE or calf swelling   Data Reviewed: Basic Metabolic Panel:  Lab 01/02/12 52/84/13 1400  NA 140 141  K 3.2* 3.3*  CL 102 101  CO2 24 24  GLUCOSE 100* 110*  BUN 20 22  CREATININE 0.82 0.90  CALCIUM 9.4 9.9  MG -- --  PHOS -- --   Liver Function Tests:  Lab 01/01/12 1400  AST 18  ALT 14  ALKPHOS 93  BILITOT 0.9  PROT 7.2  ALBUMIN 4.1   No results found for this basename: LIPASE:5,AMYLASE:5 in the last 168 hours No results found for this basename: AMMONIA:5 in the last 168 hours CBC:  Lab 01/02/12 01/01/12 1400  WBC 6.4 7.4  NEUTROABS -- 6.3  HGB 12.2 13.3  HCT 35.5* 37.2  MCV 89.4 87.9  PLT 155 152   Cardiac Enzymes:  Lab 01/02/12 0900 01/02/12 01/01/12 1923 01/01/12 1400  CKTOTAL -- -- -- --  CKMB -- -- -- --  CKMBINDEX -- -- -- --  TROPONINI <0.30 <0.30 0.31* <0.30   BNP (last 3 results) No results found for this basename: PROBNP:3 in the last 8760 hours CBG: No results found for this basename: GLUCAP:5 in the last 168  hours  No results found for this or any previous visit (from the past 240 hour(s)).   Studies: Dg Chest 2 View  01/01/2012  *RADIOLOGY REPORT*  Clinical Data: Shortness of breath.  Fall.  CHEST - 2 VIEW  Comparison: 08/04/2002  Findings: Lateral view degraded by patient arm position.  Moderate thoracic spondylosis.  Probable remote right rib trauma. Midline trachea.  Mild cardiomegaly. Mediastinal contours otherwise within normal limits. No pleural effusion or pneumothorax.  Mildly low lung volumes. Clear lungs.  IMPRESSION: Mild cardiomegaly and low lung volumes, without acute disease.   Original Report Authenticated By: Jeronimo Greaves, M.D.    Ct Angio Chest Pe W/cm &/or Wo Cm  01/01/2012  *RADIOLOGY REPORT*  Clinical Data: Ozro Russett of breath.  Chest pain.  CT ANGIOGRAPHY  CHEST  Technique:  Multidetector CT imaging of the chest using the standard protocol during bolus administration of intravenous contrast. Multiplanar reconstructed images including MIPs were obtained and reviewed to evaluate the vascular anatomy.  Contrast: OMNIPAQUE IOHEXOL 350 MG/ML SOLN  Comparison: None.  Findings: Significant bilateral pulmonary thromboembolism is present.  There is low density filling defects in segmental branches of the right upper lobe, descending branch of the right lower lobe pulmonary artery, extension into the segmental branches of the right lower lobe pulmonary artery, left upper lobe segmental branches, left lower lobe segmental branches.  The main right and left pulmonary arteries are relatively clear of pulmonary emboli.  No evidence of aortic dissection or aneurysm.  Minimal calcification of the aortic valve.  Mild atherosclerotic changes of the aortic arch.  Negative abnormal mediastinal adenopathy.  No pericardial effusion.  No pneumothorax.  No pleural effusion.  Dependent atelectasis in the lungs.  Chronic appearing right-sided rib deformities.  No definite acute rib fracture.  Chronic wedging of T11.  IMPRESSION: Study is positive for bilateral segmental and low bar acute pulmonary thromboembolism. Critical Value/emergent results were called by telephone at the time of interpretation on 01/01/2012 at 1524 hours to Dr. Judd Lien, who verbally acknowledged these results.   Original Report Authenticated By: Jolaine Click, M.D.     Scheduled Meds:   . [COMPLETED] sodium chloride   Intravenous STAT  . coumadin book   Does not apply Once  . enoxaparin (LOVENOX) injection  80 mg Subcutaneous Q12H  . [COMPLETED] enoxaparin   Does not apply Once  . pantoprazole  40 mg Oral Daily  . [COMPLETED] potassium chloride  60 mEq Oral Once  . sodium chloride  3 mL Intravenous Q12H  . vitamin B-12  1,000 mcg Oral Daily  . Vitamin D (Ergocalciferol)  50,000 Units Oral Q7 days  .  [COMPLETED] warfarin  5 mg Oral Once  . [COMPLETED] warfarin  5 mg Oral ONCE-1800  . [COMPLETED] warfarin   Does not apply Once  . Warfarin - Pharmacist Dosing Inpatient   Does not apply q1800   Continuous Infusions:   . [DISCONTINUED] heparin 1,100 Units/hr (01/02/12 0106)    Active Problems:  Pulmonary embolism  Gastroesophageal reflux disease  Hypertension  Dyslipidemia    Time spent: 30    Gilliam Hawkes, [facility]  [facility] Pager [phone]. If 8PM-8AM, please contact night-coverage at [email or web address], password [facility] 01/02/2012, 8:05 PM  LOS: 1 day

## 2012-01-03 DIAGNOSIS — I2609 Other pulmonary embolism with acute cor pulmonale: Secondary | ICD-10-CM

## 2012-01-03 LAB — CBC
HCT: 34.8 % — ABNORMAL LOW (ref 36.0–46.0)
Hemoglobin: 12 g/dL (ref 12.0–15.0)
MCH: 30.9 pg (ref 26.0–34.0)
MCHC: 34.5 g/dL (ref 30.0–36.0)
MCV: 89.7 fL (ref 78.0–100.0)
RDW: 12.7 % (ref 11.5–15.5)

## 2012-01-03 LAB — PROTIME-INR: INR: 1.15 (ref 0.00–1.49)

## 2012-01-03 MED ORDER — WARFARIN SODIUM 2.5 MG PO TABS: 5.0000 mg | ORAL_TABLET | Freq: Every evening | ORAL | Status: DC

## 2012-01-03 MED ORDER — PATIENT'S GUIDE TO USING COUMADIN BOOK
Freq: Once | Status: AC
  Administered 2012-01-03: 15:00:00
  Filled 2012-01-03: qty 1

## 2012-01-03 MED ORDER — ENOXAPARIN SODIUM 80 MG/0.8ML ~~LOC~~ SOLN: 80.0000 mg | Freq: Two times a day (BID) | SUBCUTANEOUS | Status: DC

## 2012-01-03 MED ORDER — WARFARIN VIDEO
Freq: Once | Status: AC
  Administered 2012-01-03: 15:00:00

## 2012-01-03 MED ORDER — ENOXAPARIN SODIUM 80 MG/0.8ML ~~LOC~~ SOLN
80.0000 mg | Freq: Two times a day (BID) | SUBCUTANEOUS | Status: DC
  Administered 2012-01-03: 80 mg via SUBCUTANEOUS
  Filled 2012-01-03 (×2): qty 0.8

## 2012-01-03 MED ORDER — WARFARIN SODIUM 2.5 MG PO TABS: 7.5000 mg | ORAL_TABLET | Freq: Every evening | ORAL | Status: DC

## 2012-01-03 MED ORDER — WARFARIN SODIUM 7.5 MG PO TABS
7.5000 mg | ORAL_TABLET | Freq: Once | ORAL | Status: AC
  Administered 2012-01-03: 7.5 mg via ORAL
  Filled 2012-01-03: qty 1

## 2012-01-03 NOTE — Progress Notes (Addendum)
ANTICOAGULATION CONSULT NOTE - Follow Up Consult  Pharmacy Consult for Lovenox/Coumadin Indication: pulmonary embolus  Labs:  Basename 01/03/12 0504 01/02/12 0900 01/02/12 01/01/12 1923 01/01/12 1400  HGB 12.0 -- 12.2 -- --  HCT 34.8* -- 35.5* -- 37.2  PLT 147* -- 155 -- 152  APTT -- -- -- -- --  LABPROT 14.5 -- 13.9 13.7 --  INR 1.15 -- 1.08 1.06 --  HEPARINUNFRC -- -- 0.86* -- --  CREATININE -- -- 0.82 -- 0.90  CKTOTAL -- -- -- -- --  CKMB -- -- -- -- --  TROPONINI -- <0.30 <0.30 0.31* --    Assessment: 78yo female to changed to lovenox for PE.  Also on day #3/5 overlap with Coumadin.  INR slow to rise s/p 2 doses of Coumadin 5 mg.  Goal of Therapy:  4 hr heparin level 0.6-1.2 units/ml  INR 2-3  Plan:  1. Coumadin 7.5 mg po x 1 tonight.  Recommend 7.5 mg daily at discharge until INR can be rechecked at home by home health RN. 2. Continue lovenox at current doses. 3. Daily INR, CBC q 72 hrs. 4. Will complete Coumadin education in anticipation of discharge.  Gardner Candle PharmD 01/03/2012,2:22 PM

## 2012-01-03 NOTE — Progress Notes (Signed)
Utilization Review Completed.Bethany [name] T11/24/2013   

## 2012-01-03 NOTE — Progress Notes (Signed)
Patient discharged per MD order and per hospital protocol. Patient and nephew verbalized understanding of discharge instructions, medications and follow up appointments. Patient given copy of her discharge instructions.

## 2012-01-04 NOTE — Care Management Note (Signed)
    Page 1 of 1   01/04/2012     11:26:56 AM   CARE MANAGEMENT NOTE 01/04/2012  Patient:  Bethany [name], Bethany [name]   Account Number:  [id number]  Date Initiated:  01/04/2012  Documentation initiated by:  [facility]  Subjective/Objective Assessment:     Action/Plan:   Anticipated DC Date:  01/04/2012   Anticipated DC Plan:  HOME W HOME HEALTH SERVICES      DC Planning Services  CM consult      [facility] Choice  HOME HEALTH   Choice offered to / List presented to:  C-1 Patient        HH arranged  HH-1 RN      [facility] agency  [facility]   Status of service:  Completed, signed off Medicare Important Message given?   (If response is "NO", the following Medicare IM given date fields will be blank) Date Medicare IM given:   Date Additional Medicare IM given:    Discharge Disposition:  HOME W HOME HEALTH SERVICES  Per UR Regulation:    If discussed at Long Length of Stay Meetings, dates discussed:    Comments:  01/04/2012 1100 NCM spoke to pt and gave permission to speak to nephew, Sherre Poot. NCM states spoke to nephew and they received a call from [facility] this am. Explained NCM will follow up with [facility]. Spoke to [place], [facility] rep and they have pt set up for [facility] Isidoro Donning RN CCM Case Mgmt phone [phone]

## 2012-01-12 ENCOUNTER — Other Ambulatory Visit: Payer: Self-pay | Admitting: Internal Medicine

## 2012-01-12 DIAGNOSIS — Z1231 Encounter for screening mammogram for malignant neoplasm of breast: Secondary | ICD-10-CM

## 2012-01-14 ENCOUNTER — Other Ambulatory Visit: Payer: Self-pay | Admitting: Internal Medicine

## 2012-01-14 DIAGNOSIS — Z87898 Personal history of other specified conditions: Secondary | ICD-10-CM

## 2012-01-19 ENCOUNTER — Ambulatory Visit
Admission: RE | Admit: 2012-01-19 | Discharge: 2012-01-19 | Disposition: A | Discharge status: Home / Self Care | Payer: BC Managed Care – PPO | Source: Ambulatory Visit | Attending: Internal Medicine | Admitting: Internal Medicine

## 2012-01-19 DIAGNOSIS — Z87898 Personal history of other specified conditions: Secondary | ICD-10-CM

## 2012-01-19 MED ORDER — IOHEXOL 300 MG/ML  SOLN
100.0000 mL | Freq: Once | INTRAMUSCULAR | Status: AC | PRN
  Administered 2012-01-19: 100 mL via INTRAVENOUS

## 2012-02-23 ENCOUNTER — Ambulatory Visit
Admission: RE | Admit: 2012-02-23 | Discharge: 2012-02-23 | Disposition: A | Discharge status: Home / Self Care | Payer: Medicare Other | Source: Ambulatory Visit | Attending: Internal Medicine | Admitting: Internal Medicine

## 2012-02-23 DIAGNOSIS — Z1231 Encounter for screening mammogram for malignant neoplasm of breast: Secondary | ICD-10-CM

## 2012-03-24 ENCOUNTER — Ambulatory Visit: Payer: Medicare Other | Admitting: Gynecologic Oncology

## 2012-04-14 ENCOUNTER — Ambulatory Visit: Payer: Medicare Other | Attending: Gynecologic Oncology | Admitting: Gynecologic Oncology

## 2012-04-14 ENCOUNTER — Encounter: Payer: Self-pay | Admitting: Gynecologic Oncology

## 2012-04-14 VITALS — BP 168/80 | HR 84 | Temp 97.3°F | Resp 20 | Ht 64.57 in | Wt 163.5 lb

## 2012-04-14 DIAGNOSIS — N898 Other specified noninflammatory disorders of vagina: Secondary | ICD-10-CM | POA: Insufficient documentation

## 2012-04-14 DIAGNOSIS — N9489 Other specified conditions associated with female genital organs and menstrual cycle: Secondary | ICD-10-CM | POA: Insufficient documentation

## 2012-04-14 DIAGNOSIS — I2699 Other pulmonary embolism without acute cor pulmonale: Secondary | ICD-10-CM | POA: Insufficient documentation

## 2012-04-14 DIAGNOSIS — N838 Other noninflammatory disorders of ovary, fallopian tube and broad ligament: Secondary | ICD-10-CM | POA: Insufficient documentation

## 2012-04-14 NOTE — Patient Instructions (Addendum)
Followup with Dr. Juliene Pina for ultrasound Followup with GYN when necessary changes in size of the adnexal mass pelvic pain or new thrombus Followup for INR check next week Followup with primary care practitioner    Thank you very much Ms. Bethany [name] for allowing me to provide care for you today.  I appreciate your confidence in choosing our Gynecologic Oncology team.  If you have any questions about your visit today please call our office and we will get back to you as soon as possible.  Maryclare Labrador. Koren Plyler MD., PhD Gynecologic Oncology

## 2012-04-14 NOTE — Progress Notes (Signed)
Consult Note: Gyn-Onc  Consult was requested by Dr. Mitzi Hansen for the evaluation of Bethany [name] 79 y.o. female  CC:  Chief Complaint  Patient presents with  . Pelvic Mass    New patient    HPI: This is an 79 year old nulligravida has had a right adnexal mass per the medical records since 2011. Her history is notable for recent diagnosis of pulmonary embolism not associated with prolonged travel sedentary behavior or trauma. A CT scan of the abdomen and pelvis with contrast was obtained in December of 2013 and was notable for the presence of a right 5.5 x 3.7 cm adnexal mass that had slightly increased in size in comparison to the prior examination in August of 2011. No surrounding lymphadenopathy was appreciated there is no ascites. 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    Current Meds:  Outpatient Encounter Prescriptions as of 04/14/2012  Medication Sig Dispense Refill  . chlorthalidone (HYGROTON) 25 MG tablet Take 12.5 mg by mouth daily.      Marland Kitchen olmesartan (BENICAR) 20 MG tablet Take 20 mg by mouth daily.      . vitamin B-12 (CYANOCOBALAMIN) 1000 MCG tablet Take 1,000 mcg by mouth daily.       . Vitamin D, Ergocalciferol, (DRISDOL) 50000 UNITS CAPS Take 50,000 Units by mouth every 7 (seven) days. On Saturday      . warfarin (COUMADIN) 5 MG tablet Take 5 mg by mouth daily.      . [DISCONTINUED] enoxaparin (LOVENOX) 80 MG/0.8ML injection Inject 0.8 mLs (80 mg total) into the skin every 12 (twelve) hours.  12 Syringe  0  . [DISCONTINUED] warfarin (COUMADIN) 2.5 MG tablet Take 3 tablets (7.5 mg total) by mouth every evening.  90 tablet  0   No facility-administered encounter medications on file as of 04/14/2012.     Allergy:  Allergies  Allergen Reactions  . Cataflam (Diclofenac) Other (See Comments)    Unknown; patient is unaware of allergy.  . Cephalosporins Shortness Of Breath  . Erythromycin Shortness Of Breath  . Keflex (Cephalexin) Shortness Of Breath  . Codeine Nausea And Vomiting  . Darvocet (Propoxyphene-Acetaminophen) Other (See Comments)    Unknown; patient is unaware of allergy  . Naproxen Other (See Comments)    Unknown; patient is unaware of allergy  . Penicillins Other (See Comments)    Patient states that she CAN take this; unaware of allergy  . Synalgos-Dc (Aspirin-Caff-Dihydrocodeine) Other (See Comments)    Unknown; patient is unaware of allergy  . Ultram (Tramadol) Other (See Comments)    Unknown; patient is unaware of allergy    Social Hx:   History   Social History  . Marital Status: Single    Spouse Name: N/A    Number of Children: N/A  . Years of Education: N/A   Occupational History  . Not on file.   Social History Main Topics  . Smoking status: Never Smoker   . Smokeless tobacco: Not on file  . Alcohol Use: No  . Drug Use: No  . Sexually Active: Not on file   Other Topics Concern  . Not on file   Social History Narrative  . No narrative on file  Past Surgical Hx:  Past Surgical History  Procedure Laterality Date  . Abdominal hysterectomy    . Ovarian cyst surgery    . Cataract extraction      Past Medical Hx:  Past Medical History  Diagnosis Date  . Migraine   . GERD (gastroesophageal reflux disease)   . Hypertension   . Hypercholesteremia   . Pulmonary embolism 01/01/2012    Past Gynecological History: Gravida 0   Family Hx:  Family History  Problem Relation Age of Onset  . Brain cancer Mother   . Heart attack Father   . Diabetes Sister   . Heart disease Sister     Review of Systems:  Constitutional  Feels well, reports decreased appetite denies early satiety   Cardiovascular  No chest pain, shortness of breath,  or edema  Pulmonary  No cough or wheeze.  Gastro Intestinal  No nausea, vomitting, or diarrhoea. Rare bright red blood per rectum, no abdominal pain, change in bowel movement, or constipation.  Genito Urinary  No frequency, urgency, dysuria, vaginal spotting 2-3 weeks ago for one day only Musculo Skeletal  No myalgia, arthralgia, joint swelling or pain  Neurologic  No weakness, numbness, change in gait,  Psychology  No depression, anxiety, insomnia.   Vitals:  Blood pressure 168/80, pulse 84, temperature 97.3 F (36.3 C), resp. rate 20, height 5' 4.57" (1.64 m), weight 163 lb 8 oz (74.163 kg).  Physical Exam: WD in NAD Neck  Supple NROM, without any enlargements.  Lymph Node Survey No cervical supraclavicular or inguinal adenopathy Cardiovascular  Pulse normal rate, regularity and rhythm. S1 and S2 normal.  Lungs  Clear to auscultation bilateraly,  Psychiatry  Alert and oriented to person, place, and time  Abdomen  Normoactive bowel sounds, abdomen soft, non-tender and obese. Surgical  Mid line incision intact without evidence of hernia.  Back No CVA tenderness Genito Urinary  Vulva/vagina: Normal external female genitalia.  No lesions. No discharge or bleeding.  Bladder/urethra:  No lesions or masses  Vagina: Markedly atrophic stenotic, no vaginal bleeding or discharge  Cervix: Flush with the vaginal vault, no lesions  Uterus: Small, mobile, no parametrial involvement or nodularity.  Adnexa: No palpable masses. Rectal  Good tone, no masses no cul de sac nodularity.  Extremities  No bilateral cyanosis, clubbing or edema.   Assessment/Plan:  Bethany [name]  is a 79 y.o.  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 This history is complicated by the recent diagnosis of pulmonary embolism. It's unclear if the fibrothecoma is conjugating to this new medical comorbidity or whether not this neoplasm is  malignant. The options offered to the patient were observation with repeat ultrasound in 3 months. In the interim if there is a new thrombus, abdominal pain or change in size then more serious consideration will have to be placed for resection. The other options presented the patient was that of right salpingo-oophorectomy possibly in a minimally invasive fashion. The patient and her nephew strongly wished to proceed with nonsurgical option.  The patient reports a one-day-episode  vaginal bleeding 2-3 weeks ago. There was also bleeding from hemorrhoids at the same time.  On examination an endometrial biopsy in the office would extremely uncomfortable if at all feasible. I am aware that Dr. Juliene Pina will obtain an ultrasound shortly. If the stripe appears thickened strong consideration will be given to an examination under anesthesia, Cytotec administration and endometrial sampling.  The patient's been advised to followup after the ultrasound will be arranged by Dr. Juliene Pina.  Patient's been advised to followup with her INR check next Tuesday and to inform her primary care practitioner of the bleeding  Laurette Schimke, MD, PhD 04/14/2012, 3:47 PM

## 2012-06-14 ENCOUNTER — Other Ambulatory Visit: Payer: Self-pay | Admitting: Dermatology

## 2012-06-27 ENCOUNTER — Ambulatory Visit (HOSPITAL_COMMUNITY)
Admission: RE | Admit: 2012-06-27 | Discharge: 2012-06-27 | Disposition: A | Discharge status: Home / Self Care | Payer: Medicare Other | Source: Ambulatory Visit | Attending: Cardiovascular Disease | Admitting: Cardiovascular Disease

## 2012-06-27 ENCOUNTER — Other Ambulatory Visit (HOSPITAL_COMMUNITY): Payer: Self-pay | Admitting: Podiatry

## 2012-06-27 ENCOUNTER — Ambulatory Visit (HOSPITAL_BASED_OUTPATIENT_CLINIC_OR_DEPARTMENT_OTHER)
Admission: RE | Admit: 2012-06-27 | Discharge: 2012-06-27 | Disposition: A | Discharge status: Home / Self Care | Payer: Medicare Other | Source: Ambulatory Visit | Attending: Cardiovascular Disease | Admitting: Cardiovascular Disease

## 2012-06-27 DIAGNOSIS — R609 Edema, unspecified: Secondary | ICD-10-CM | POA: Insufficient documentation

## 2012-06-27 DIAGNOSIS — M7989 Other specified soft tissue disorders: Secondary | ICD-10-CM

## 2012-06-27 DIAGNOSIS — M79609 Pain in unspecified limb: Secondary | ICD-10-CM

## 2012-06-27 NOTE — Progress Notes (Signed)
A left lower extremity duplex doppler was completed. Larene Pickett RVT

## 2012-06-27 NOTE — Progress Notes (Signed)
The left lower extremity duplex doppler exam demonstrated no evidence of DVT. Larene Pickett RVT

## 2013-03-08 ENCOUNTER — Other Ambulatory Visit: Payer: Self-pay | Admitting: Obstetrics & Gynecology

## 2013-03-08 DIAGNOSIS — R1900 Intra-abdominal and pelvic swelling, mass and lump, unspecified site: Secondary | ICD-10-CM

## 2013-03-16 ENCOUNTER — Ambulatory Visit
Admission: RE | Admit: 2013-03-16 | Discharge: 2013-03-16 | Disposition: A | Discharge status: Home / Self Care | Payer: 59 | Source: Ambulatory Visit | Attending: Obstetrics & Gynecology | Admitting: Obstetrics & Gynecology

## 2013-03-16 DIAGNOSIS — R1900 Intra-abdominal and pelvic swelling, mass and lump, unspecified site: Secondary | ICD-10-CM

## 2013-03-16 MED ORDER — GADOBENATE DIMEGLUMINE 529 MG/ML IV SOLN
15.0000 mL | Freq: Once | INTRAVENOUS | Status: AC | PRN
  Administered 2013-03-16: 15 mL via INTRAVENOUS

## 2013-07-08 IMAGING — CT CT ABD-PELV W/ CM
1 of 5 series · 13 of 36 positions shown, 18 images · IV contrast (READICAT/WATER & [ID] OMNI 300)
Comparison: Pelvic MRI 04/14/2010.  CT of the abdomen and pelvis
09/20/2009.

CLINICAL DATA: History of pelvic mass.

CT ABDOMEN AND PELVIS WITH CONTRAST
TECHNIQUE: Multidetector CT imaging of the abdomen and pelvis was
performed following the standard protocol during bolus
administration of intravenous contrast.
Contrast: 100mL OMNIPAQUE IOHEXOL 300 MG/ML  SOLN

[Series 2: abd/pelvis with · axial · 0.76mm/px · z∈[-375,-30]mm · 13 of 79 slices shown, 18 images]
[im 5/79  soft-tissue]
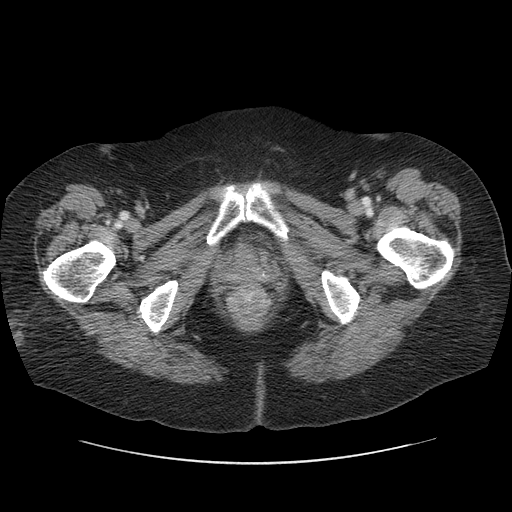
[im 5/79  bone]
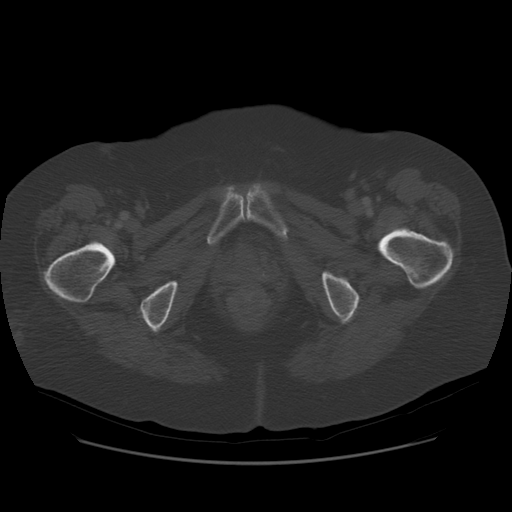
[im 14/79  soft-tissue]
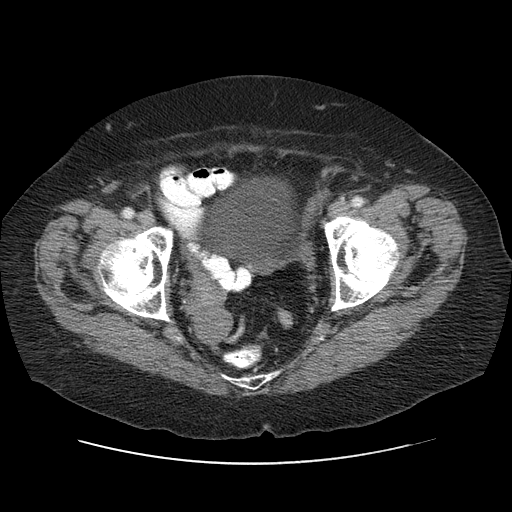
[im 19/79  soft-tissue]
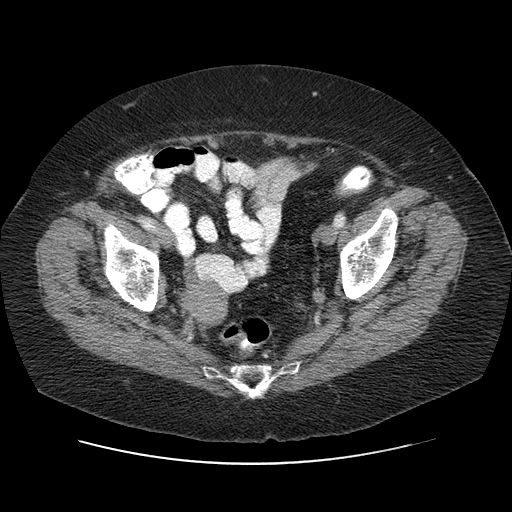
[im 23/79  soft-tissue]
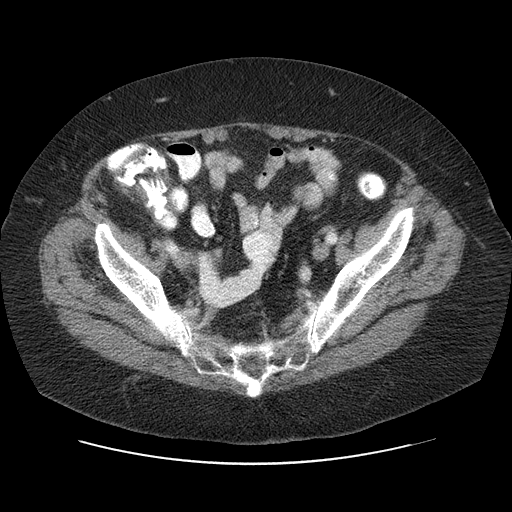
[im 33/79  soft-tissue]
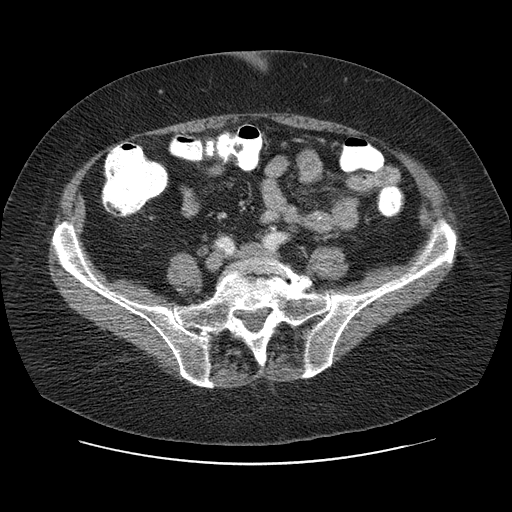
[im 37/79  soft-tissue]
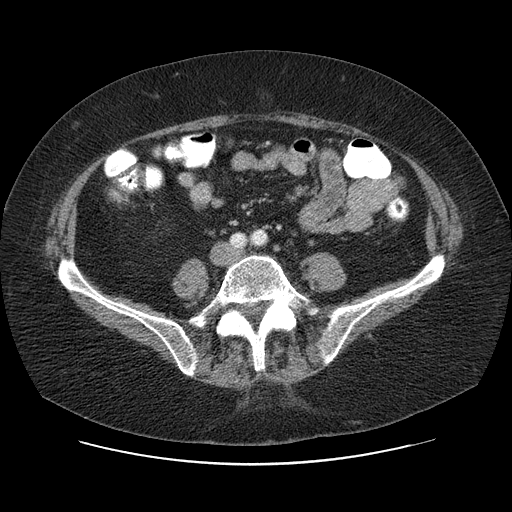
[im 42/79  soft-tissue]
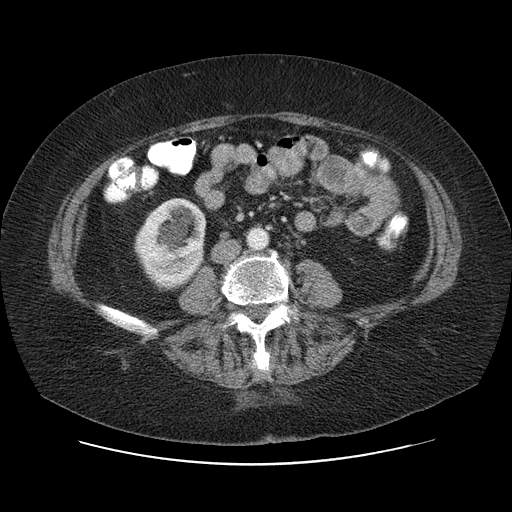
[im 51/79  soft-tissue]
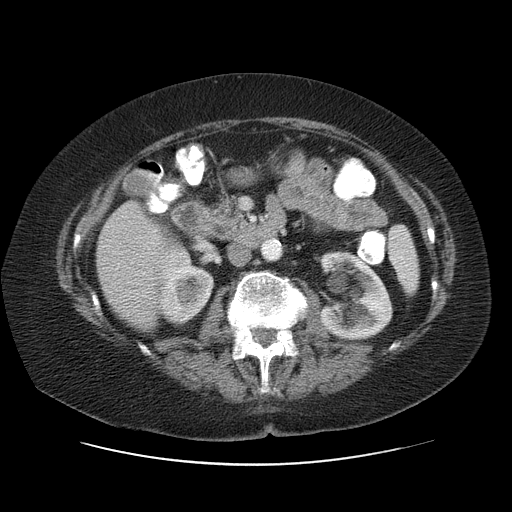
[im 56/79  soft-tissue]
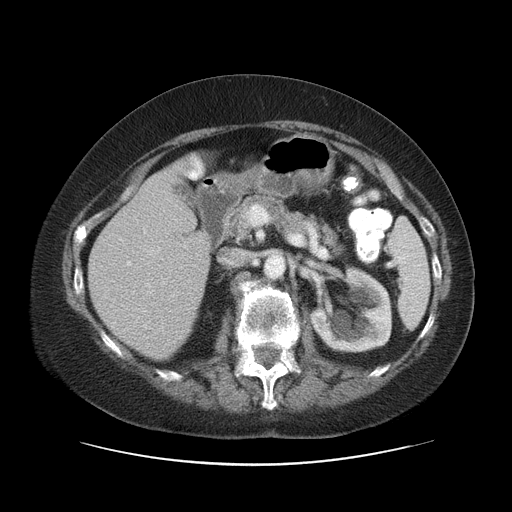
[im 56/79  bone]
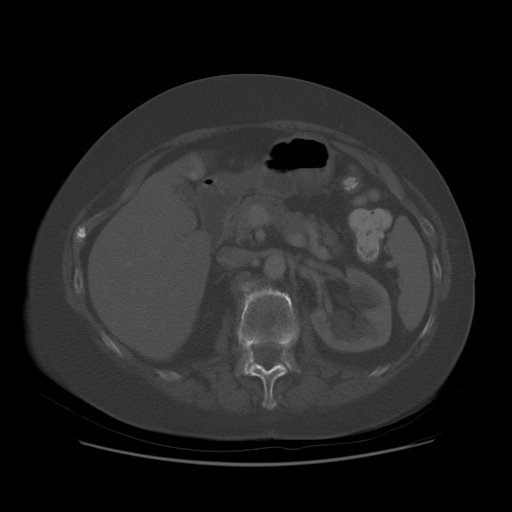
[im 60/79  soft-tissue]
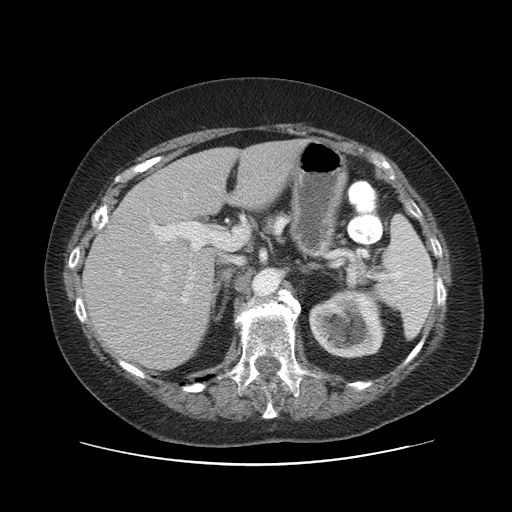
[im 60/79  lung]
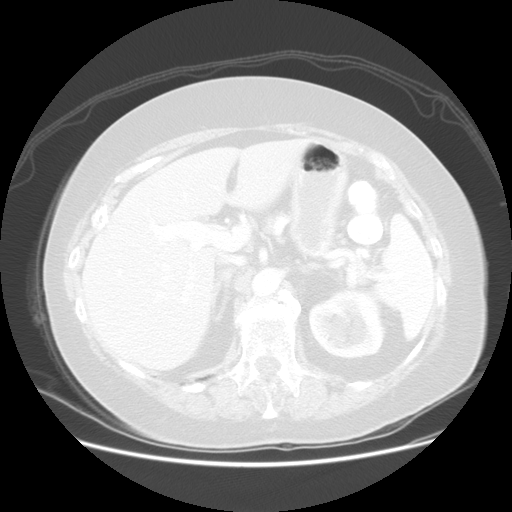
[im 65/79  lung]
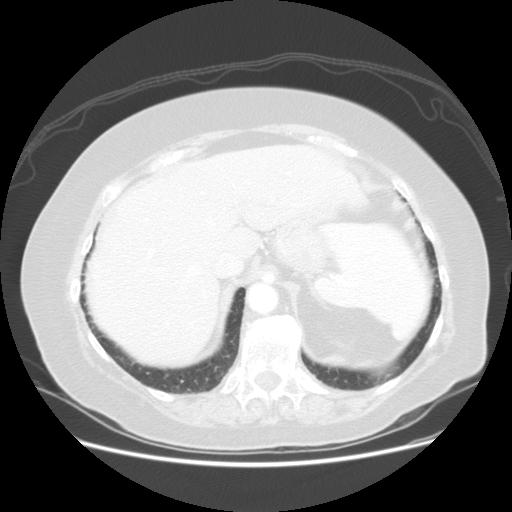
[im 69/79  soft-tissue]
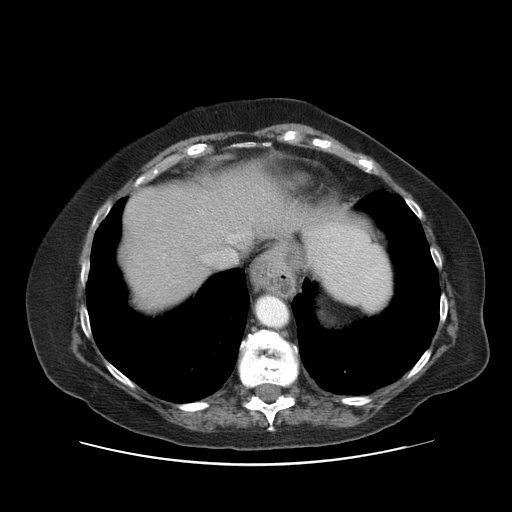
[im 69/79  lung]
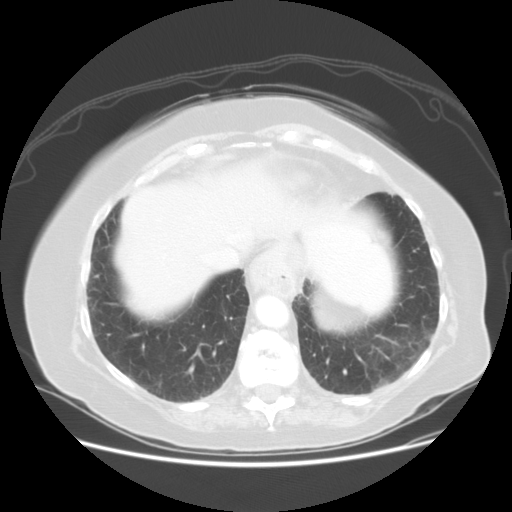
[im 74/79  soft-tissue]
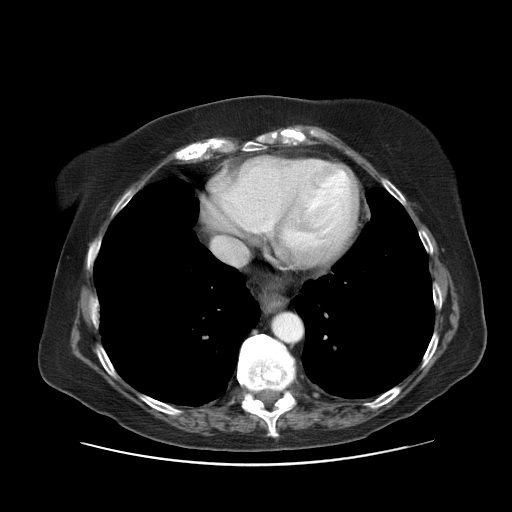
[im 74/79  lung]
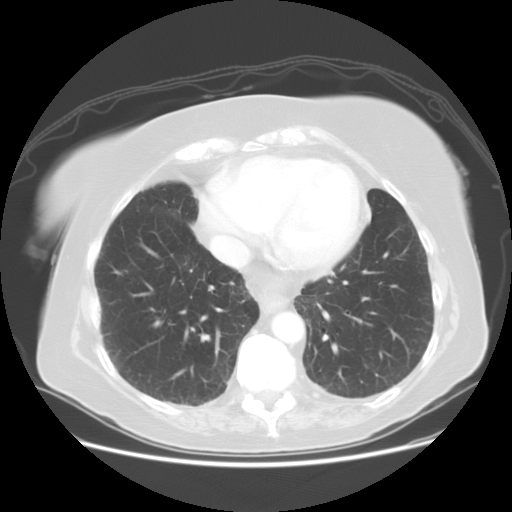

[13 of 36 positions shown; findings below may reference images not displayed]

FINDINGS: Lung Bases: 4 mm left lower lobe nodule (image 4 of series 3) is
nonspecific. Mild cardiomegaly.  Small hiatal hernia.

Abdomen/Pelvis:  The appearance of the liver, gallbladder,
pancreas, spleen and bilateral adrenal glands is unremarkable.
Mild parenchymal thinning is noted throughout the kidneys
bilaterally.  There are numerous subcentimeter low attenuation
lesions in the kidneys bilaterally which are too small to
definitively characterize, but are statistically likely to
represent small cysts.  In addition, numerous peripelvic cysts are
present in the kidneys bilaterally.

In the right hemi pelvis along the right pelvic sidewall there is a
complex 5.5 x 3.7 cm soft tissue attenuation lesion with internal
calcification which is only slightly larger than on remote prior
examination from 09/20/2009.  No surrounding lymphadenopathy.
Status post hysterectomy.  Left ovary is not confidently identified
and may be surgically absent.  No ascites or pneumoperitoneum and
no pathologic distension of small bowel.   Urinary bladder is
unremarkable in appearance.

Musculoskeletal: There are no aggressive appearing lytic or blastic
lesions noted in the visualized portions of the skeleton.
IMPRESSION: 1.  The appearance of the right sided pelvic mass is very similar
to the prior examination, as above, again likely to represent a
fibrothecoma.
2.  Multiple peripelvic cysts in the kidneys bilaterally, with
numerous tiny subcentimeter low attenuation parenchymal lesions in
the kidneys as well, which are also likely to represent tiny cysts
(technically too small to definitively characterize).
3.  Small hiatal hernia.
4.  4 mm left lower lobe nodule. If the patient is at high risk for
bronchogenic carcinoma, follow-up chest CT at 1 year is
recommended.  If the patient is at low risk, no follow-up is
needed.  This recommendation follows the consensus statement:
Guidelines for Management of Small Pulmonary Nodules Detected on CT
Scans:  A Statement from the [HOSPITAL] as published in

## 2013-09-18 ENCOUNTER — Other Ambulatory Visit: Payer: Self-pay | Admitting: Gastroenterology

## 2013-09-19 ENCOUNTER — Encounter (HOSPITAL_COMMUNITY): Payer: Self-pay | Admitting: Pharmacy Technician

## 2013-09-22 ENCOUNTER — Encounter (HOSPITAL_COMMUNITY)
Admission: RE | Disposition: A | Discharge status: Home / Self Care | Payer: Self-pay | Source: Ambulatory Visit | Attending: Gastroenterology

## 2013-09-22 ENCOUNTER — Ambulatory Visit (HOSPITAL_COMMUNITY): Payer: Medicare Other

## 2013-09-22 ENCOUNTER — Encounter (HOSPITAL_COMMUNITY): Payer: Self-pay | Admitting: *Deleted

## 2013-09-22 ENCOUNTER — Ambulatory Visit (HOSPITAL_COMMUNITY)
Admission: RE | Admit: 2013-09-22 | Discharge: 2013-09-22 | Disposition: A | Discharge status: Home / Self Care | Payer: Medicare Other | Source: Ambulatory Visit | Attending: Gastroenterology | Admitting: Gastroenterology

## 2013-09-22 DIAGNOSIS — Z885 Allergy status to narcotic agent status: Secondary | ICD-10-CM | POA: Insufficient documentation

## 2013-09-22 DIAGNOSIS — Z86711 Personal history of pulmonary embolism: Secondary | ICD-10-CM | POA: Insufficient documentation

## 2013-09-22 DIAGNOSIS — D128 Benign neoplasm of rectum: Secondary | ICD-10-CM | POA: Diagnosis not present

## 2013-09-22 DIAGNOSIS — Z7901 Long term (current) use of anticoagulants: Secondary | ICD-10-CM | POA: Insufficient documentation

## 2013-09-22 DIAGNOSIS — C20 Malignant neoplasm of rectum: Secondary | ICD-10-CM

## 2013-09-22 DIAGNOSIS — Z881 Allergy status to other antibiotic agents status: Secondary | ICD-10-CM | POA: Insufficient documentation

## 2013-09-22 DIAGNOSIS — K921 Melena: Secondary | ICD-10-CM | POA: Diagnosis present

## 2013-09-22 DIAGNOSIS — D126 Benign neoplasm of colon, unspecified: Secondary | ICD-10-CM | POA: Diagnosis not present

## 2013-09-22 DIAGNOSIS — D129 Benign neoplasm of anus and anal canal: Secondary | ICD-10-CM

## 2013-09-22 HISTORY — PX: COLONOSCOPY: SHX5424

## 2013-09-22 LAB — CEA: CEA: 2.7 ng/mL (ref 0.0–5.0)

## 2013-09-22 SURGERY — COLONOSCOPY
Anesthesia: Moderate Sedation

## 2013-09-22 MED ORDER — MIDAZOLAM HCL 5 MG/ML IJ SOLN
INTRAMUSCULAR | Status: AC
  Filled 2013-09-22: qty 1

## 2013-09-22 MED ORDER — IOHEXOL 300 MG/ML  SOLN
80.0000 mL | Freq: Once | INTRAMUSCULAR | Status: AC | PRN
  Administered 2013-09-22: 80 mL via INTRAVENOUS

## 2013-09-22 MED ORDER — SODIUM CHLORIDE 0.9 % IV SOLN: INTRAVENOUS | Status: DC

## 2013-09-22 MED ORDER — MIDAZOLAM HCL 5 MG/5ML IJ SOLN
INTRAMUSCULAR | Status: DC | PRN
  Administered 2013-09-22: 1 mg via INTRAVENOUS
  Administered 2013-09-22 (×2): 2 mg via INTRAVENOUS

## 2013-09-22 MED ORDER — SPOT INK MARKER SYRINGE KIT
PACK | SUBMUCOSAL | Status: AC
  Filled 2013-09-22: qty 5

## 2013-09-22 MED ORDER — SPOT INK MARKER SYRINGE KIT
PACK | SUBMUCOSAL | Status: DC | PRN
  Administered 2013-09-22: 2 mL via SUBMUCOSAL

## 2013-09-22 MED ORDER — FENTANYL CITRATE 0.05 MG/ML IJ SOLN
INTRAMUSCULAR | Status: AC
  Filled 2013-09-22: qty 2

## 2013-09-22 MED ORDER — FENTANYL CITRATE 0.05 MG/ML IJ SOLN
INTRAMUSCULAR | Status: DC | PRN
  Administered 2013-09-22 (×3): 25 ug via INTRAVENOUS

## 2013-09-22 NOTE — H&P (Signed)
  Bethany [name] HPI: This is an 80 year old female with a PMH of a fibrothecoma and she has persistent issues with hematochezia.  The bleeding has worsened over the past several months.  She was evaluated in the office and I deemed that the risks were higher than the benefits of performing a colonoscopy, however, she states that she needs 24 roles of toilet tissue for her hematochezia.  The office examination revealed fresh blood mixed with liquid stool.  No masses were palpated.  Additionally, she is on coumadin for a PE.  Past Medical History  Diagnosis Date  . Migraine   . GERD (gastroesophageal reflux disease)   . Hypertension   . Hypercholesteremia   . Pulmonary embolism 01/01/2012    Past Surgical History  Procedure Laterality Date  . Abdominal hysterectomy    . Ovarian cyst surgery    . Cataract extraction      Family History  Problem Relation Age of Onset  . Brain cancer Mother   . Heart attack Father   . Diabetes Sister   . Heart disease Sister     Social History:  reports that she has never smoked. She does not have any smokeless tobacco history on file. She reports that she does not drink alcohol or use illicit drugs.  Allergies:  Allergies  Allergen Reactions  . Cataflam [Diclofenac] Other (See Comments)    Unknown; patient is unaware of allergy.  . Cephalosporins Shortness Of Breath  . Erythromycin Shortness Of Breath  . Keflex [Cephalexin] Shortness Of Breath  . Codeine Nausea And Vomiting  . Darvocet [Propoxyphene N-Acetaminophen] Other (See Comments)    Unknown; patient is unaware of allergy  . Naproxen Other (See Comments)    Unknown; patient is unaware of allergy  . Penicillins Other (See Comments)    Patient states that she CAN take this; unaware of allergy  . Synalgos-Dc [Aspirin-Caff-Dihydrocodeine] Other (See Comments)    Unknown; patient is unaware of allergy  . Ultram [Tramadol] Other (See Comments)    Unknown; patient is unaware of allergy     Medications:  Scheduled:  Continuous: . sodium chloride      No results found for this or any previous visit (from the past 24 hour(s)).   No results found.  ROS:  As stated above in the HPI otherwise negative.  Blood pressure 182/62, pulse 93, temperature 97.5 F (36.4 C), temperature source Oral, resp. rate 17, height 5\' 7"  (1.702 m), weight 141 lb (63.957 kg), SpO2 100.00%.    PE: Gen: NAD, Alert and Oriented HEENT:  [place]/AT, EOMI Neck: Supple, no LAD Lungs: CTA Bilaterally CV: RRR without M/G/R ABM: Soft, NTND, +BS Ext: No C/C/E  Assessment/Plan: 1) Hematochezia. 2) Heme positive stool.  Plan: 1) Colonoscopy  Vallen Calabrese D 09/22/2013, 11:22 AM

## 2013-09-22 NOTE — Op Note (Signed)
[facility] [place], [address]   OPERATIVE PROCEDURE REPORT  PATIENT: Bethany, [name]  MR#: [id number] BIRTHDATE: December 06, 1928  GENDER: Female ENDOSCOPIST: Carol Ada, MD ASSISTANT:   William Dalton, technician Lalla Brothers, RN PROCEDURE DATE: 09/22/2013 PROCEDURE:   Colonoscopy with snare polypectomy ASA CLASS:   Class III INDICATIONS:Rectal Bleeding. MEDICATIONS: Versed 5 mg IV and Fentanyl 75 mcg IV  DESCRIPTION OF PROCEDURE:   After the risks benefits and alternatives of the procedure were thoroughly explained, informed consent was obtained.  A digital rectal exam revealed a rectal mass that was hard and fixed.    The Pentax Adult Colon [id number] endoscope was introduced through the anus  and advanced to the cecum, which was identified by both the appendix and ileocecal valve , No adverse events experienced.    The quality of the prep was excellent. .  The instrument was then slowly withdrawn as the colon was fully examined.   FINDINGS: Upon initial entry there was a very large fungating nonobstructing circumfirential rectal mass.  The mass was 3-4 cm from the anal verge and it was 3-4 cm in length.  The mass was ulcerated and friable.  Multiple cold biopsies were obtained and the proximal portion was tattooed with SPOT.  In the cecum there was the possibility of a polyp and this was removed with a cold snare.  No other abnormalities were identified.   Retroflexed views revealed no abnormalities.     The scope was then withdrawn from the patient and the procedure terminated.  In retrospect, the rectal examination in the office did reveal the mass, but it was mistaken for a hard piece of stool.  COMPLICATIONS: There were no complications.  IMPRESSION: 1) Nonobstructing large circumfirential mass. 2) ? Cecal polyp.  RECOMMENDATIONS: 1) CT scan of the Chest/ABM/Pelvis. 2) Await biopsy results. 3) Check CEA. 4) Schedule rectal  EUS if there is no metastases.  _______________________________ eSignedCarol Ada, MD 09/22/2013 1:22 PM

## 2013-09-22 NOTE — Discharge Instructions (Addendum)
Conscious Sedation °Sedation is the use of medicines to promote relaxation and relieve discomfort and anxiety. Conscious sedation is a type of sedation. Under conscious sedation you are less alert than normal but are still able to respond to instructions or stimulation. Conscious sedation is used during short medical and dental procedures. It is milder than deep sedation or general anesthesia and allows you to return to your regular activities sooner.  °LET YOUR HEALTH CARE PROVIDER KNOW ABOUT:  °· Any allergies you have. °· All medicines you are taking, including vitamins, herbs, eye drops, creams, and over-the-counter medicines. °· Use of steroids (by mouth or creams). °· Previous problems you or members of your family have had with the use of anesthetics. °· Any blood disorders you have. °· Previous surgeries you have had. °· Medical conditions you have. °· Possibility of pregnancy, if this applies. °· Use of cigarettes, alcohol, or illegal drugs. °RISKS AND COMPLICATIONS °Generally, this is a safe procedure. However, as with any procedure, problems can occur. Possible problems include: °· Oversedation. °· Trouble breathing on your own. You may need to have a breathing tube until you are awake and breathing on your own. °· Allergic reaction to any of the medicines used for the procedure. °BEFORE THE PROCEDURE °· You may have blood tests done. These tests can help show how well your kidneys and liver are working. They can also show how well your blood clots. °· A physical exam will be done.   °· Only take medicines as directed by your health care provider. You may need to stop taking medicines (such as blood thinners, aspirin, or nonsteroidal anti-inflammatory drugs) before the procedure.   °· Do not eat or drink at least 6 hours before the procedure or as directed by your health care provider. °· Arrange for a responsible adult, family member, or friend to take you home after the procedure. He or she should stay  with you for at least 24 hours after the procedure, until the medicine has worn off. °PROCEDURE  °· An intravenous (IV) catheter will be inserted into one of your veins. Medicine will be able to flow directly into your body through this catheter. You may be given medicine through this tube to help prevent pain and help you relax. °· The medical or dental procedure will be done. °AFTER THE PROCEDURE °· You will stay in a recovery area until the medicine has worn off. Your blood pressure and pulse will be checked.   °·  Depending on the procedure you had, you may be allowed to go home when you can tolerate liquids and your pain is under control. °Document Released: 10/21/2000 Document Revised: 01/31/2013 Document Reviewed: 10/03/2012 °ExitCare® Patient Information ©2015 [employer]. This information is not intended to replace advice given to you by your health care provider. Make sure you discuss any questions you have with your health care provider. ° °

## 2013-09-25 ENCOUNTER — Encounter (HOSPITAL_COMMUNITY): Payer: Self-pay | Admitting: Gastroenterology

## 2013-09-27 ENCOUNTER — Encounter (HOSPITAL_COMMUNITY): Payer: Self-pay | Admitting: Pharmacy Technician

## 2013-09-27 ENCOUNTER — Other Ambulatory Visit: Payer: Self-pay | Admitting: Gastroenterology

## 2013-09-27 ENCOUNTER — Encounter (HOSPITAL_COMMUNITY): Payer: Self-pay | Admitting: *Deleted

## 2013-09-29 ENCOUNTER — Ambulatory Visit (HOSPITAL_COMMUNITY)
Admission: RE | Admit: 2013-09-29 | Discharge: 2013-09-29 | Disposition: A | Discharge status: Home / Self Care | Payer: Medicare Other | Source: Ambulatory Visit | Attending: Gastroenterology | Admitting: Gastroenterology

## 2013-09-29 ENCOUNTER — Encounter (HOSPITAL_COMMUNITY): Payer: Self-pay | Admitting: Anesthesiology

## 2013-09-29 ENCOUNTER — Encounter (HOSPITAL_COMMUNITY)
Admission: RE | Disposition: A | Discharge status: Home / Self Care | Payer: Self-pay | Source: Ambulatory Visit | Attending: Gastroenterology

## 2013-09-29 ENCOUNTER — Encounter (HOSPITAL_COMMUNITY): Payer: Self-pay

## 2013-09-29 DIAGNOSIS — Z86711 Personal history of pulmonary embolism: Secondary | ICD-10-CM | POA: Diagnosis not present

## 2013-09-29 DIAGNOSIS — C20 Malignant neoplasm of rectum: Secondary | ICD-10-CM

## 2013-09-29 DIAGNOSIS — E7800 Pure hypercholesterolemia, unspecified: Secondary | ICD-10-CM | POA: Insufficient documentation

## 2013-09-29 DIAGNOSIS — K219 Gastro-esophageal reflux disease without esophagitis: Secondary | ICD-10-CM | POA: Insufficient documentation

## 2013-09-29 DIAGNOSIS — Z7901 Long term (current) use of anticoagulants: Secondary | ICD-10-CM | POA: Diagnosis not present

## 2013-09-29 DIAGNOSIS — C801 Malignant (primary) neoplasm, unspecified: Secondary | ICD-10-CM

## 2013-09-29 DIAGNOSIS — Z90710 Acquired absence of both cervix and uterus: Secondary | ICD-10-CM | POA: Diagnosis not present

## 2013-09-29 DIAGNOSIS — I10 Essential (primary) hypertension: Secondary | ICD-10-CM | POA: Insufficient documentation

## 2013-09-29 HISTORY — DX: Malignant neoplasm of rectum: C20

## 2013-09-29 HISTORY — PX: EUS: SHX5427

## 2013-09-29 HISTORY — DX: Malignant (primary) neoplasm, unspecified: C80.1

## 2013-09-29 HISTORY — DX: Hypothyroidism, unspecified: E03.9

## 2013-09-29 HISTORY — DX: Neoplasm of unspecified behavior of other genitourinary organ: D49.59

## 2013-09-29 HISTORY — DX: Other specified diseases of anus and rectum: K62.89

## 2013-09-29 SURGERY — ULTRASOUND, LOWER GI TRACT, ENDOSCOPIC
Anesthesia: Moderate Sedation

## 2013-09-29 MED ORDER — MIDAZOLAM HCL 10 MG/2ML IJ SOLN
INTRAMUSCULAR | Status: AC
  Filled 2013-09-29: qty 2

## 2013-09-29 MED ORDER — PROPOFOL 10 MG/ML IV BOLUS
INTRAVENOUS | Status: AC
  Filled 2013-09-29: qty 20

## 2013-09-29 MED ORDER — SODIUM CHLORIDE 0.9 % IV SOLN: INTRAVENOUS | Status: DC

## 2013-09-29 MED ORDER — LACTATED RINGERS IV SOLN: INTRAVENOUS | Status: DC

## 2013-09-29 MED ORDER — FENTANYL CITRATE 0.05 MG/ML IJ SOLN
INTRAMUSCULAR | Status: AC
  Filled 2013-09-29: qty 2

## 2013-09-29 NOTE — Op Note (Signed)
[facility] [place], [address]   OPERATIVE PROCEDURE REPORT  PATIENT: Bethany, [name]  MR#: [id number] BIRTHDATE: 12-Feb-1928  GENDER: Female ENDOSCOPIST: Carol Ada, MD REFERRED BY: PROCEDURE DATE:  09/29/2013 PROCEDURE:   Rectal EUS ASA CLASS:   Class III INDICATIONS: Rectal cancer MEDICATIONS: None  DESCRIPTION OF PROCEDURE:   After the risks benefits and alternatives of the procedure were thoroughly explained, informed consent was obtained.  Throughout the procedure, the patients blood pressure, pulse and oxygen saturations were monitored continuously. Under direct visualization, the     endoscope was introduced through the anus  and advanced to the    .  Water was used as necessary to provide an acoustic interface.  Imaging was obtained at 7.5 and 12Mhz. Upon completion of the imaging, water was removed and the patient was sent to the recovery room in satisfactory condition.     FINDINGS:  The large nonobstructing friable circumfirential mass was again identified.  The lesion was 4 cm in length and it was 4-5 cm from the anal verge.  EUS revealed that the lesion was a T3, but I was not able to adequately assess the peritumoral lymph nodes.  A faint node was identified and it was noted to be 5 mm.  A the urinary bladder there was some possibility of invasion, but I do not believe this is the case.  I suspect that it is secondary to the inflammation from the mass.  The greatest depth of invasion was approximately 1.5 cm.  The echoendoscope was not able to be advanced beyond the lesion as solid stool was encountered.  Repeat cold bbiopsies were performed of the lesion as the initial biopsies did not reveal malignancy.       The scope was then withdrawn from the patient and the procedure terminated.  COMPLICATIONS: ENDOSCOPIC VISUALIZATION:  ULTRASONIC VISUALIZATION:  STAGING:  ENDOSCOPIC IMPRESSION: 1) uT3N1Mx rectal  cancer.  RECOMMENDATIONS: 1) Await biosy results. 2) Referral to Surgery and Oncology. REPEAT EXAM:  _______________________________ eSignedCarol Ada, MD 09/29/2013 2:01 PM   CC:

## 2013-09-29 NOTE — Interval H&P Note (Signed)
History and Physical Interval Note:  09/29/2013 11:45 AM  Bethany [name]  has presented today for surgery, with the diagnosis of rectal mass  The various methods of treatment have been discussed with the patient and family. After consideration of risks, benefits and other options for treatment, the patient has consented to  Procedure(s): LOWER ENDOSCOPIC ULTRASOUND (EUS) (N/A) as a surgical intervention .  The patient's history has been reviewed, patient examined, no change in status, stable for surgery.  I have reviewed the patient's chart and labs.  Questions were answered to the patient's satisfaction.     Candid Bovey D

## 2013-09-29 NOTE — Discharge Instructions (Signed)
Flexible Sigmoidoscopy, Care After °Refer to this sheet in the next few weeks. These instructions provide you with information on caring for yourself after your procedure. Your health care provider may also give you more specific instructions. Your treatment has been planned according to current medical practices, but problems sometimes occur. Call your health care provider if you have any problems or questions after your procedure. °WHAT TO EXPECT AFTER THE PROCEDURE °After your procedure, it is typical to have the following:  °· Abdominal cramps. °· Bloating. °· A small amount of rectal bleeding if you had a biopsy. °HOME CARE INSTRUCTIONS °· Only take over-the-counter or prescription medicines for pain, fever, or discomfort as directed by your health care provider. °· Resume your normal diet and activities as directed by your health care provider. °SEEK MEDICAL CARE IF: °· You have abdominal pain or cramping that lasts longer than 1 hour after the procedure. °· You continue to have small amounts of rectal bleeding after 24 hours. °· You have nausea or vomiting. °· You feel weak or dizzy. °SEEK IMMEDIATE MEDICAL CARE IF:  °· You have a fever. °· You pass large blood clots or see a large amount of blood in the toilet after having a bowel movement. This may also occur 10-14 days after the procedure. It is more likely if you had a biopsy. °· You develop abdominal pain that is not relieved with medicine or your abdominal pain gets worse. °· You have nausea or vomiting for more than 24 hours after the procedure. °Document Released: 01/31/2013 Document Reviewed: 01/31/2013 °ExitCare® Patient Information ©2015 [employer]. This information is not intended to replace advice given to you by your health care provider. Make sure you discuss any questions you have with your health care provider. ° °

## 2013-09-29 NOTE — H&P (View-Only) (Signed)
  Bethany [name] HPI: This is an 80 year old female with a PMH of a fibrothecoma and she has persistent issues with hematochezia.  The bleeding has worsened over the past several months.  She was evaluated in the office and I deemed that the risks were higher than the benefits of performing a colonoscopy, however, she states that she needs 24 roles of toilet tissue for her hematochezia.  The office examination revealed fresh blood mixed with liquid stool.  No masses were palpated.  Additionally, she is on coumadin for a PE.  Past Medical History  Diagnosis Date  . Migraine   . GERD (gastroesophageal reflux disease)   . Hypertension   . Hypercholesteremia   . Pulmonary embolism 01/01/2012    Past Surgical History  Procedure Laterality Date  . Abdominal hysterectomy    . Ovarian cyst surgery    . Cataract extraction      Family History  Problem Relation Age of Onset  . Brain cancer Mother   . Heart attack Father   . Diabetes Sister   . Heart disease Sister     Social History:  reports that she has never smoked. She does not have any smokeless tobacco history on file. She reports that she does not drink alcohol or use illicit drugs.  Allergies:  Allergies  Allergen Reactions  . Cataflam [Diclofenac] Other (See Comments)    Unknown; patient is unaware of allergy.  . Cephalosporins Shortness Of Breath  . Erythromycin Shortness Of Breath  . Keflex [Cephalexin] Shortness Of Breath  . Codeine Nausea And Vomiting  . Darvocet [Propoxyphene N-Acetaminophen] Other (See Comments)    Unknown; patient is unaware of allergy  . Naproxen Other (See Comments)    Unknown; patient is unaware of allergy  . Penicillins Other (See Comments)    Patient states that she CAN take this; unaware of allergy  . Synalgos-Dc [Aspirin-Caff-Dihydrocodeine] Other (See Comments)    Unknown; patient is unaware of allergy  . Ultram [Tramadol] Other (See Comments)    Unknown; patient is unaware of allergy     Medications:  Scheduled:  Continuous: . sodium chloride      No results found for this or any previous visit (from the past 24 hour(s)).   No results found.  ROS:  As stated above in the HPI otherwise negative.  Blood pressure 182/62, pulse 93, temperature 97.5 F (36.4 C), temperature source Oral, resp. rate 17, height 5\' 7"  (1.702 m), weight 141 lb (63.957 kg), SpO2 100.00%.    PE: Gen: NAD, Alert and Oriented HEENT:  [place]/AT, EOMI Neck: Supple, no LAD Lungs: CTA Bilaterally CV: RRR without M/G/R ABM: Soft, NTND, +BS Ext: No C/C/E  Assessment/Plan: 1) Hematochezia. 2) Heme positive stool.  Plan: 1) Colonoscopy  Ariauna Farabee D 09/22/2013, 11:22 AM

## 2013-10-02 ENCOUNTER — Encounter (HOSPITAL_COMMUNITY): Payer: Self-pay | Admitting: Gastroenterology

## 2013-10-05 ENCOUNTER — Telehealth: Payer: Self-pay | Admitting: Internal Medicine

## 2013-10-05 ENCOUNTER — Telehealth: Payer: Self-pay | Admitting: Oncology

## 2013-10-05 NOTE — Telephone Encounter (Signed)
S/W PATIENT AND GAVE NP APPT FOR 09/02 @ 1:30 W/DR. SHADAD.  REFERRING DR. PATRICK HUNG DX- RECTAL CA

## 2013-10-05 NOTE — Telephone Encounter (Signed)
C/D 10/05/13 for appt. 10/11/13

## 2013-10-10 ENCOUNTER — Other Ambulatory Visit: Payer: Self-pay | Admitting: Oncology

## 2013-10-10 DIAGNOSIS — C20 Malignant neoplasm of rectum: Secondary | ICD-10-CM

## 2013-10-11 ENCOUNTER — Encounter: Payer: Self-pay | Admitting: Oncology

## 2013-10-11 ENCOUNTER — Encounter: Payer: Self-pay | Admitting: *Deleted

## 2013-10-11 ENCOUNTER — Encounter: Payer: Self-pay | Admitting: Radiation Oncology

## 2013-10-11 ENCOUNTER — Ambulatory Visit: Payer: Medicare Other

## 2013-10-11 ENCOUNTER — Ambulatory Visit (HOSPITAL_BASED_OUTPATIENT_CLINIC_OR_DEPARTMENT_OTHER): Payer: Medicare Other | Admitting: Oncology

## 2013-10-11 ENCOUNTER — Other Ambulatory Visit (HOSPITAL_BASED_OUTPATIENT_CLINIC_OR_DEPARTMENT_OTHER): Payer: Medicare Other

## 2013-10-11 ENCOUNTER — Telehealth: Payer: Self-pay | Admitting: Oncology

## 2013-10-11 VITALS — BP 158/73 | HR 72 | Temp 97.3°F | Resp 20

## 2013-10-11 DIAGNOSIS — C20 Malignant neoplasm of rectum: Secondary | ICD-10-CM

## 2013-10-11 DIAGNOSIS — Z86711 Personal history of pulmonary embolism: Secondary | ICD-10-CM

## 2013-10-11 DIAGNOSIS — K7689 Other specified diseases of liver: Secondary | ICD-10-CM

## 2013-10-11 LAB — CBC WITH DIFFERENTIAL/PLATELET
BASO%: 0.9 % (ref 0.0–2.0)
BASOS ABS: 0 10*3/uL (ref 0.0–0.1)
EOS%: 1.2 % (ref 0.0–7.0)
Eosinophils Absolute: 0.1 10*3/uL (ref 0.0–0.5)
HEMATOCRIT: 34.9 % (ref 34.8–46.6)
HEMOGLOBIN: 11.3 g/dL — AB (ref 11.6–15.9)
LYMPH#: 0.9 10*3/uL (ref 0.9–3.3)
LYMPH%: 18.4 % (ref 14.0–49.7)
MCH: 28.6 pg (ref 25.1–34.0)
MCHC: 32.2 g/dL (ref 31.5–36.0)
MCV: 88.8 fL (ref 79.5–101.0)
MONO#: 0.4 10*3/uL (ref 0.1–0.9)
MONO%: 7.2 % (ref 0.0–14.0)
NEUT#: 3.7 10*3/uL (ref 1.5–6.5)
NEUT%: 72.3 % (ref 38.4–76.8)
Platelets: 214 10*3/uL (ref 145–400)
RBC: 3.93 10*6/uL (ref 3.70–5.45)
RDW: 13.3 % (ref 11.2–14.5)
WBC: 5.1 10*3/uL (ref 3.9–10.3)

## 2013-10-11 LAB — COMPREHENSIVE METABOLIC PANEL (CC13)
ALBUMIN: 3.7 g/dL (ref 3.5–5.0)
ALT: 17 U/L (ref 0–55)
ANION GAP: 9 meq/L (ref 3–11)
AST: 16 U/L (ref 5–34)
Alkaline Phosphatase: 84 U/L (ref 40–150)
BUN: 19.2 mg/dL (ref 7.0–26.0)
CALCIUM: 9.6 mg/dL (ref 8.4–10.4)
CHLORIDE: 105 meq/L (ref 98–109)
CO2: 27 mEq/L (ref 22–29)
CREATININE: 0.7 mg/dL (ref 0.6–1.1)
Glucose: 92 mg/dl (ref 70–140)
POTASSIUM: 3.8 meq/L (ref 3.5–5.1)
Sodium: 141 mEq/L (ref 136–145)
Total Bilirubin: 0.54 mg/dL (ref 0.20–1.20)
Total Protein: 6.5 g/dL (ref 6.4–8.3)

## 2013-10-11 NOTE — Progress Notes (Signed)
GI Location of Tumor / Histology: Rectal invasive adenocarcinoma  Bethany [name] presented < 1 month ago with symptoms of: recurrent rectal bleeding, significantly worse recently, loose bowels, hematochezia  Biopsies of rectum revealed:  09/29/13 Diagnosis Rectum, biopsy - INVASIVE ADENOCARCINOMA. PLEASE SEE COMMENT. Microscopic Comment Sections show invasive adenocarcinoma with extracellular mucin. MMR stains will be performed and an addendum report will follow.  ADDITIONAL INFORMATION: Immunohistochemistry for mismatch repair proteins (MMR) was attempted but there is no adequate tumoral tissue present on deeper sectioning for evaluation.  Past/Anticipated interventions by surgeon, if any: none at this date  Past/Anticipated interventions by medical oncology, if any: Dr Alen Blew: neoadjuvant radiation concomitantly with 5-FU or Xeloda, followed by surgical resection if cleared. Next appt w/Dr Alen Blew on 10/27/13.  Weight changes, if any: no  Bowel/Bladder complaints, if any: last normal BM on Mon, states she has had a little stool since then, some diarrhea with bleeding daily  Nausea / Vomiting, if any: no  Pain issues, if any:  No, had severe abdominal pain Mon, then had BM, diarrhea  SAFETY ISSUES:  Prior radiation? no  Pacemaker/ICD? no  Possible current pregnancy? no  Is the patient on methotrexate? no  Current Complaints / other details:  Single, lives independently with her nephew, does not drive. Patient on Coumadin therapy since PE in 2013.

## 2013-10-11 NOTE — Progress Notes (Signed)
Reason for Referral: Rectal cancer.   HPI: 80 year old woman currently off [place] where she lived the majority of her life. She is an elderly frail woman with a history of hypertension and hyperlipidemia also history of hemorrhoids. She had recurrent rectal bleeding intermittently that have recently been significantly worse. She is chronically anticoagulated on Coumadin for the last 2 years after she has developed a massive pulmonary embolism and was unprovoked. She developed acute shortness of breath and found to have bilateral pulmonary emboli and RV strain. Her rectal bleeding has increased most recently and Dr. Benson Norway performed a colonoscopy and found to have a rectal tumor which was biopsied no malignancy was identified at that time. She underwent rectal EUS on 09/29/2013 which revealed a 4 cm lesion in length and it was 4-5 cm from the anal verge. The staging at that time by EUS criteria was T3 N0 although it was not adequately assess the peritumoral lymph node. The tumor was biopsied at that time as well. Biopsy confirmed invasive adenocarcinoma. She had a CT scan of the chest abdomen and pelvis which did not show any clear-cut metastasis but did have a suspicious liver lesion that needs to be further evaluated. patient referred to me for evaluation regarding these new findings.  Clinically, she continues to have loose bowel habits and diarrhea as well as hematochezia. She has not had any headaches or blurry vision. She does not report any syncope or seizures. She does not report any chest pain, shortness of breath, cough or hemoptysis. She does not report any palpitation, orthopnea or PND. She does not report any leg edema. She is not abort any nausea, vomiting, abdominal pain, constipation or obstruction. She does not report any frequency urgency or hesitancy. She is not abort any skeletal complaints. She does not report any arthralgias or myalgias. She is able to perform his activities of daily  living and lives independently with her nephew. She does not drive as of late. Rest of the review of systems unremarkable.   Past Medical History  Diagnosis Date  . Migraine   . GERD (gastroesophageal reflux disease)   . Hypertension   . Hypercholesteremia   . Pulmonary embolism 01/01/2012  . Hypothyroidism   . Rectal mass   . Ovarian tumor     sees dr Lisbeth Renshaw ob-gyn yearly for evaluation  :  Past Surgical History  Procedure Laterality Date  . Abdominal hysterectomy    . Ovarian cyst surgery  yrs ago  . Cataract extraction Bilateral   . Colonoscopy N/A 09/22/2013    Procedure: COLONOSCOPY;  Surgeon: Beryle Beams, MD;  Location: [facility];  Service: Endoscopy;  Laterality: N/A;  . Eus N/A 09/29/2013    Procedure: LOWER ENDOSCOPIC ULTRASOUND (EUS);  Surgeon: Beryle Beams, MD;  Location: [facility];  Service: Endoscopy;  Laterality: N/A;  :  Current outpatient prescriptions:cholecalciferol (VITAMIN D) 1000 UNITS tablet, Take 2,000 Units by mouth daily., Disp: , Rfl: ;  levothyroxine (SYNTHROID, LEVOTHROID) 25 MCG tablet, Take 25 mcg by mouth See admin instructions. Only take on Monday, Wednesday, Friday and Sunday., Disp: , Rfl: ;  olmesartan (BENICAR) 40 MG tablet, Take 40 mg by mouth every morning. , Disp: , Rfl:  warfarin (COUMADIN) 5 MG tablet, Take 5 mg by mouth daily., Disp: , Rfl: :  Allergies  Allergen Reactions  . Cataflam [Diclofenac] Other (See Comments)    Unknown; patient is unaware of allergy.  . Cephalosporins Shortness Of Breath  . Erythromycin Shortness Of Breath  .  Keflex [Cephalexin] Shortness Of Breath  . Codeine Nausea And Vomiting  . Darvocet [Propoxyphene N-Acetaminophen] Other (See Comments)    Unknown; patient is unaware of allergy  . Naproxen Other (See Comments)    Unknown; patient is unaware of allergy  . Penicillins Other (See Comments)    Patient states that she CAN take this; unaware of allergy  . Synalgos-Dc [Aspirin-Caff-Dihydrocodeine]  Other (See Comments)    Unknown; patient is unaware of allergy  . Ultram [Tramadol] Other (See Comments)    Unknown; patient is unaware of allergy  :  Family History  Problem Relation Age of Onset  . Brain cancer Mother   . Heart attack Father   . Diabetes Sister   . Heart disease Sister   :  History   Social History  . Marital Status: Single    Spouse Name: N/A    Number of Children: N/A  . Years of Education: N/A   Occupational History  . Not on file.   Social History Main Topics  . Smoking status: Never Smoker   . Smokeless tobacco: Never Used  . Alcohol Use: No  . Drug Use: No  . Sexual Activity: Not on file   Other Topics Concern  . Not on file   Social History Narrative  . No narrative on file  :  Pertinent items are noted in HPI.  Exam: ECOG 1 Blood pressure 200/93, pulse 70, temperature 97.3 F (36.3 C), temperature source Oral, resp. rate 20. General appearance: alert and cooperative Head: Normocephalic, without obvious abnormality Throat: lips, mucosa, and tongue normal; teeth and gums normal Neck: no adenopathy Back: symmetric, no curvature. ROM normal. No CVA tenderness. Resp: clear to auscultation bilaterally Chest wall: no tenderness Cardio: regular rate and rhythm, S1, S2 normal, no murmur, click, rub or gallop GI: soft, non-tender; bowel sounds normal; no masses,  no organomegaly Extremities: extremities normal, atraumatic, no cyanosis or edema Pulses: 2+ and symmetric Skin: Skin color, texture, turgor normal. No rashes or lesions Lymph nodes: Cervical, supraclavicular, and axillary nodes normal.   Recent Labs  10/11/13 1313  WBC 5.1  HGB 11.3*  HCT 34.9  PLT 214    Recent Labs  10/11/13 1313  NA 141  K 3.8  CO2 27  GLUCOSE 92  BUN 19.2  CREATININE 0.7  CALCIUM 9.6      Ct Chest W Contrast  09/22/2013   CLINICAL DATA:  Recent diagnosis of rectal carcinoma.  EXAM: CT CHEST, ABDOMEN, AND PELVIS WITH CONTRAST  TECHNIQUE:  Multidetector CT imaging of the chest, abdomen and pelvis was performed following the standard protocol during bolus administration of intravenous contrast.  CONTRAST:  5mL OMNIPAQUE IOHEXOL 300 MG/ML  SOLN  COMPARISON:  CT 01/19/2012; MR pelvis 03/16/2013  FINDINGS: CT CHEST FINDINGS  Visualized thyroid is unremarkable. No enlarged axillary, mediastinal or hilar lymphadenopathy. Normal heart size. Trace pericardial fluid. Normal caliber aorta and main pulmonary artery. Large hiatal hernia.  Central airways are patent. 4 mm left lower lobe pulmonary nodule (image 42; series 3), stable dating back to 01/19/2012. There is a 5 mm nodular opacity within the right upper lobe (image 12; series 3). No pleural effusion or pneumothorax. Minimal dependent atelectasis. No large consolidative pulmonary opacities.  CT ABDOMEN AND PELVIS FINDINGS  Liver is normal in size and contour. There is a 1.1 x 0.9 cm low-attenuation lesion within the medial aspect of the right hepatic lobe. Gallbladder is unremarkable. Portal vein is patent. Spleen, pancreas and bilateral adrenal glands  are unremarkable. Kidneys enhance symmetrically with contrast. Multiple parapelvic cysts.  Normal caliber abdominal aorta. No retroperitoneal lymphadenopathy. Urinary bladder is unremarkable. Uterus is surgically absent. Unchanged soft tissue mass within the right hemipelvis measuring 3.5 x 5.4 cm, most compatible with benign etiology.  There is focal narrowing and circumferential wall thickening of the distal rectum with perirectal fat stranding, compatible with history of rectal mass at this location. There is a 7 mm perirectal lymph node (image 100; series 2). Small amount of free fluid adjacent to the rectum. The remainder of the colon and small bowel is grossly unremarkable.  There is a small foci of gas along the margin of the sigmoid colon (image 97; series 2) and a small amount of adjacent free fluid.  Lower lumbar spine degenerative change. No  aggressive or acute appearing osseous lesions.  IMPRESSION: 1. There is a 1.1 cm low-attenuation lesion within the right hepatic lobe which is nonspecific however metastatic disease is not excluded. The liver can be further evaluated with MRI as clinically indicated. 2. Annular constricting mass involving the rectum concerning for rectal carcinoma. There is an adjacent 7 mm perirectal lymph node. Metastatic disease to this location is not excluded. 3. Tiny pulmonary nodule within the left lower lobe which within stable dating back to 01/19/2012. Additional small nodule within the right upper lobe. Attention on followup. 4. 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 5. Small focus of gas along the margin of the sigmoid colon with a small amount of adjacent free fluid potentially post procedural/post biopsy in etiology.  These results will be called to the ordering clinician or representative by the Radiologist Assistant, and communication documented in the PACS or zVision Dashboard.   Electronically Signed   By: Lovey Newcomer M.D.   On: 09/22/2013 16:51   Ct Abdomen Pelvis W Contrast  09/22/2013   CLINICAL DATA:  Recent diagnosis of rectal carcinoma.  EXAM: CT CHEST, ABDOMEN, AND PELVIS WITH CONTRAST  TECHNIQUE: Multidetector CT imaging of the chest, abdomen and pelvis was performed following the standard protocol during bolus administration of intravenous contrast.  CONTRAST:  38mL OMNIPAQUE IOHEXOL 300 MG/ML  SOLN  COMPARISON:  CT 01/19/2012; MR pelvis 03/16/2013  FINDINGS: CT CHEST FINDINGS  Visualized thyroid is unremarkable. No enlarged axillary, mediastinal or hilar lymphadenopathy. Normal heart size. Trace pericardial fluid. Normal caliber aorta and main pulmonary artery. Large hiatal hernia.  Central airways are patent. 4 mm left lower lobe pulmonary nodule (image 42; series 3), stable dating back to 01/19/2012. There is a 5 mm nodular opacity within  the right upper lobe (image 12; series 3). No pleural effusion or pneumothorax. Minimal dependent atelectasis. No large consolidative pulmonary opacities.  CT ABDOMEN AND PELVIS FINDINGS  Liver is normal in size and contour. There is a 1.1 x 0.9 cm low-attenuation lesion within the medial aspect of the right hepatic lobe. Gallbladder is unremarkable. Portal vein is patent. Spleen, pancreas and bilateral adrenal glands are unremarkable. Kidneys enhance symmetrically with contrast. Multiple parapelvic cysts.  Normal caliber abdominal aorta. No retroperitoneal lymphadenopathy. Urinary bladder is unremarkable. Uterus is surgically absent. Unchanged soft tissue mass within the right hemipelvis measuring 3.5 x 5.4 cm, most compatible with benign etiology.  There is focal narrowing and circumferential wall thickening of the distal rectum with perirectal fat stranding, compatible with history of rectal mass at this location. There is a 7 mm perirectal lymph node (image 100; series 2). Small amount of free fluid  adjacent to the rectum. The remainder of the colon and small bowel is grossly unremarkable.  There is a small foci of gas along the margin of the sigmoid colon (image 97; series 2) and a small amount of adjacent free fluid.  Lower lumbar spine degenerative change. No aggressive or acute appearing osseous lesions.  IMPRESSION: 1. There is a 1.1 cm low-attenuation lesion within the right hepatic lobe which is nonspecific however metastatic disease is not excluded. The liver can be further evaluated with MRI as clinically indicated. 2. Annular constricting mass involving the rectum concerning for rectal carcinoma. There is an adjacent 7 mm perirectal lymph node. Metastatic disease to this location is not excluded. 3. Tiny pulmonary nodule within the left lower lobe which within stable dating back to 01/19/2012. Additional small nodule within the right upper lobe. Attention on followup. 4. 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 5. Small focus of gas along the margin of the sigmoid colon with a small amount of adjacent free fluid potentially post procedural/post biopsy in etiology.  These results will be called to the ordering clinician or representative by the Radiologist Assistant, and communication documented in the PACS or zVision Dashboard.   Electronically Signed   By: Lovey Newcomer M.D.   On: 09/22/2013 16:51    Assessment and Plan:   80 year old woman with the following issues:   1. Rectal cancer presented with hematochezia and status post colonoscopy and EUS. The care of by EUS criteria was 4 cm in length around 4-5 cm from the anal verge. The staging was T3 N0 tentatively. CT scan chest abdomen and pelvis did not reveal any clear-cut metastasis but a small liver lesion of 1.1 cm these to be further evaluated. The natural course of this disease as well as treatment option was discussed with the patient and her nephew today. I explained to her the standard of care would be at this point neoadjuvant radiation therapy concomitantly with 5-FU based chemotherapy. I think she is a reasonable candidate for this modality and this will be followed up by surgical resection. It is unclear to me that she is a surgical candidate and she will need to be evaluated by Gen. surgery and she will likely need medical clearance before any surgical intervention. I feel that regardless of her surgical candidacy she will need treatment of a localized disease because of the bleeding and the potential obstruction that it can cause.  I will refer her to radiation oncology for an evaluation to discuss that treatment modality further.  Logistics of administration of systemic chemotherapy concomitantly with radiation were discussed. The agent of choice at this point will be 5-FU or oral Xeloda. Risks and benefits of these drugs were discussed. Complications from these drugs  include nausea, vomiting, myelosuppression, diarrhea and more importantly interactions with warfarin. This information was given to the patient at that time.  The options is to proceed with radiation therapy alone to avoid the interaction with warfarin but I think that will be less than ideal. Other possibility is to stop warfarin altogether or maybe consider different agent such as Xarelto.   The risks and benefits of combined modality chemotherapy and radiation were discussed. The benefit would be tumor reduction and down staging of the tumor and certainly improvement and localize symptoms such as bleeding, pain and obstruction.  2. History of pulmonary embolism: He appears to have a massive unprovoked pulmonary embolism but that was over 2 years  ago. Her followup CT scan did not show any blood clots and probably reasonable to hold off on Coumadin during her treatments and replace it with aspirin. I fear that switching to an agent such as Louanna Raw might pose a higher bleeding risk without clear-cut antidote.   3. Liver lesion: This lesion described in his CT scan measuring 1.1 cm certainly could represent metastasis but need to be characterized further. I will obtain an MRI of the liver or further characterization.  All her questions were answered to her satisfaction and she will followup after her imaging studies and consultations have been completed.

## 2013-10-11 NOTE — Progress Notes (Signed)
Checked in new pt with no financial concerns at this time.  Pt is here for an Oncology concern so I gave her my card for any questions or concerns that may arise later.

## 2013-10-11 NOTE — Telephone Encounter (Signed)
gv and printed appt sched and avs for pt for Sept....pt sched to see Dr. Clayton Lefort 9.3.15

## 2013-10-12 ENCOUNTER — Ambulatory Visit
Admission: RE | Admit: 2013-10-12 | Discharge: 2013-10-12 | Disposition: A | Discharge status: Home / Self Care | Payer: Medicare Other | Source: Ambulatory Visit | Attending: Radiation Oncology | Admitting: Radiation Oncology

## 2013-10-12 ENCOUNTER — Encounter: Payer: Self-pay | Admitting: Radiation Oncology

## 2013-10-12 ENCOUNTER — Other Ambulatory Visit: Payer: Self-pay | Admitting: Oncology

## 2013-10-12 ENCOUNTER — Encounter: Payer: Self-pay | Admitting: Medical Oncology

## 2013-10-12 VITALS — BP 149/59 | HR 66 | Temp 97.6°F | Resp 20 | Ht 67.0 in | Wt 140.0 lb

## 2013-10-12 DIAGNOSIS — I10 Essential (primary) hypertension: Secondary | ICD-10-CM | POA: Insufficient documentation

## 2013-10-12 DIAGNOSIS — R1900 Intra-abdominal and pelvic swelling, mass and lump, unspecified site: Secondary | ICD-10-CM | POA: Insufficient documentation

## 2013-10-12 DIAGNOSIS — Z7901 Long term (current) use of anticoagulants: Secondary | ICD-10-CM | POA: Diagnosis not present

## 2013-10-12 DIAGNOSIS — Z90710 Acquired absence of both cervix and uterus: Secondary | ICD-10-CM | POA: Diagnosis not present

## 2013-10-12 DIAGNOSIS — Z86711 Personal history of pulmonary embolism: Secondary | ICD-10-CM | POA: Diagnosis not present

## 2013-10-12 DIAGNOSIS — E039 Hypothyroidism, unspecified: Secondary | ICD-10-CM | POA: Diagnosis not present

## 2013-10-12 DIAGNOSIS — Z510 Encounter for antineoplastic radiation therapy: Secondary | ICD-10-CM | POA: Insufficient documentation

## 2013-10-12 DIAGNOSIS — C20 Malignant neoplasm of rectum: Secondary | ICD-10-CM

## 2013-10-12 HISTORY — DX: Malignant (primary) neoplasm, unspecified: C80.1

## 2013-10-12 HISTORY — DX: Malignant neoplasm of rectum: C20

## 2013-10-12 MED ORDER — CAPECITABINE 500 MG PO TABS: ORAL_TABLET | ORAL | Status: DC

## 2013-10-12 NOTE — Progress Notes (Signed)
Per MD, prescription to Xeloda placed in [facility], managed care, inbox for preauth.

## 2013-10-12 NOTE — Progress Notes (Signed)
[facility] NEW PATIENT EVALUATION  Name: Bethany [name] MRN: [id number]  Date:   10/12/2013           DOB: 1929-02-07  Status: outpatient   CC: [name], MD  Wyatt Portela, MD    REFERRING PHYSICIAN: Wyatt Portela, MD   DIAGNOSIS: Clinical stage T3 N0 MX invasive adenocarcinoma of the rectum   HISTORY OF PRESENT ILLNESS:  Bethany [name] is a 80 y.o. female who is seen today through the courtesy of Dr. Alen Blew of for consideration of "preoperative" radiation therapy along with chemotherapy in the management of her T3 N0 invasive adenocarcinoma of the rectum. She gives a 2 year history of intermittent rectal bleeding which was attributed to hemorrhoids and being on anticoagulation for a pulmonary embolus. Her rectal bleeding worsened and she was seen by Dr. Benson Norway who performed colonoscopy on 09/22/2013. He noted a large fungating nonobstructing circumferential mass at approximately 3-4 cm from the anal verge, in 3-4 cm length. Biopsies were diagnostic for invasive adenocarcinoma. Endoscopic ultrasound on 09/29/2013 showed a 4 cm lesion which was felt to be a T3 N0 cancer but assessment of the peritumoral lymph nodes was inadequate. Her staging CT scan of the pelvis from 09/22/2013 showed a rectal mass along with a 0.7 cm perirectal lymph node. There was also a 1.1 cm low attenuation lesion in the right hepatic lobe, and metastatic disease could not be ruled out. A MRI scan is scheduled for 10/24/2013 to stage her liver. 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 The patient was seen in consultation by Dr. Alen Blew who feels that she would be a candidate for chemoradiation "preoperatively" for palliation if general surgery does not feel that she is a candidate for surgery. She currently denies pelvic discomfort although she does give a history of crampy abdominal pain. Her appetite is poor and she is lost approximately 20 pounds over the past 6  months. She does not feel that she is lost any weight over the past month.  PREVIOUS RADIATION THERAPY: No   PAST MEDICAL HISTORY:  has a past medical history of Migraine; GERD (gastroesophageal reflux disease); Hypertension; Hypercholesteremia; Pulmonary embolism (01/01/2012); Hypothyroidism; Rectal mass; Ovarian tumor; Cancer (09/29/13); and Rectal cancer (09/29/13).     PAST SURGICAL HISTORY:  Past Surgical History  Procedure Laterality Date  . Ovarian cyst surgery  yrs ago  . Cataract extraction Bilateral   . Colonoscopy N/A 09/22/2013    Procedure: COLONOSCOPY;  Surgeon: Beryle Beams, MD;  Location: [facility];  Service: Endoscopy;  Laterality: N/A;  . Eus N/A 09/29/2013    Procedure: LOWER ENDOSCOPIC ULTRASOUND (EUS);  Surgeon: Beryle Beams, MD;  Location: [facility];  Service: Endoscopy;  Laterality: N/A;  . Abdominal hysterectomy      years ago, prolapsed uterus     FAMILY HISTORY: family history includes Brain cancer in her mother; Diabetes in her sister; Heart attack in her father; Heart disease in her sister. Her father died of a brain aneurysm and 28. Her mother died from a brain tumor. No family history of colorectal cancer.   SOCIAL HISTORY:  reports that she has never smoked. She has never used smokeless tobacco. She reports that she does not drink alcohol or use illicit drugs. Never married, no children. She lives with her nephew, Bethany [name]. 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   ALLERGIES: Cataflam; Cephalosporins; Erythromycin; Keflex; Codeine; Darvocet; Naproxen; Penicillins;  Synalgos-dc; and Ultram   MEDICATIONS:  Current Outpatient Prescriptions  Medication Sig Dispense Refill  . cholecalciferol (VITAMIN D) 1000 UNITS tablet Take 2,000 Units by mouth daily.      Marland Kitchen levothyroxine (SYNTHROID, LEVOTHROID) 25 MCG tablet Take 25 mcg by mouth See admin instructions. Only take on Monday, Wednesday, Friday and Sunday.      . levothyroxine (SYNTHROID,  LEVOTHROID) 50 MCG tablet Take 50 mcg by mouth daily before breakfast.      . olmesartan (BENICAR) 40 MG tablet Take 40 mg by mouth every morning.       . warfarin (COUMADIN) 5 MG tablet Take 5 mg by mouth daily.       No current facility-administered medications for this encounter.     REVIEW OF SYSTEMS:  Pertinent items are noted in HPI.    PHYSICAL EXAM:  height is 5\' 7"  (1.702 m) and weight is 140 lb (63.504 kg). Her oral temperature is 97.6 F (36.4 C). Her blood pressure is 149/59 and her pulse is 66. Her respiration is 20.   Alert and oriented 80 year old white female appearing her stated age. Head and neck examination: Grossly unremarkable. Nodes: Without palpable supraclavicular, or inguinal lymphadenopathy. Abdomen: Without masses organomegaly. Rectal: There is a circumferential mass palpable at approximately 4-5 centimeters from the anal verge. I did not assess mobility, trying to avoid bleeding. Extremities: Without edema.   LABORATORY DATA:  Lab Results  Component Value Date   WBC 5.1 10/11/2013   HGB 11.3* 10/11/2013   HCT 34.9 10/11/2013   MCV 88.8 10/11/2013   PLT 214 10/11/2013   Lab Results  Component Value Date   NA 141 10/11/2013   K 3.8 10/11/2013   CL 102 01/02/2012   CO2 27 10/11/2013   Lab Results  Component Value Date   ALT 17 10/11/2013   AST 16 10/11/2013   ALKPHOS 84 10/11/2013   BILITOT 0.54 10/11/2013      IMPRESSION: Clinical stage T3 N0 MX invasive adenocarcinoma of the rectum. At the very least she should receive "preoperative" radiotherapy along with chemotherapy for palliation. We discussed the potential acute and late toxicities of radiation therapy. She will be fully staged after her MRI scan on September 15. If her liver is negative, then general surgery may consider surgical resection depending on her risks. She might be a candidate for low anterior resection if she has significant regression of her disease. I will have the patient return here for simulation  early next week and begin her radiation therapy the week of September 14 (probably midweek). I will coordinate her care with Dr. Alen Blew.   PLAN: As discussed above.  I spent 60 minutes minutes face to face with the patient and more than 50% of that time was spent in counseling and/or coordination of care.

## 2013-10-12 NOTE — Progress Notes (Signed)
Please see the Nurse Progress Note in the MD Initial Consult Encounter for this patient. 

## 2013-10-13 ENCOUNTER — Encounter: Payer: Self-pay | Admitting: Oncology

## 2013-10-13 NOTE — Progress Notes (Signed)
Faxed xeloda prescription to [facility] °

## 2013-10-17 ENCOUNTER — Ambulatory Visit
Admission: RE | Admit: 2013-10-17 | Discharge: 2013-10-17 | Disposition: A | Discharge status: Home / Self Care | Payer: Medicare Other | Source: Ambulatory Visit | Attending: Radiation Oncology | Admitting: Radiation Oncology

## 2013-10-17 DIAGNOSIS — C20 Malignant neoplasm of rectum: Secondary | ICD-10-CM

## 2013-10-17 DIAGNOSIS — Z510 Encounter for antineoplastic radiation therapy: Secondary | ICD-10-CM | POA: Diagnosis not present

## 2013-10-17 NOTE — Progress Notes (Signed)
Complex simulation/treatment planning note: The patient was taken to the CT simulation room. She was placed supine with construction of a VAC LOC immobilization device. A BB was placed along the perineum to mark the anal margin. She was then scanned. The CT data set was sent to the planning system were contoured her treatment CTV in addition to the BB. Normal avoidance structures are contoured by dosimetry she set up a 4 field technique. Four unique multileaf collimators are designed to conform the field. I prescribing 4500 cGy in 25 sessions the pelvis followed by a boost of 540 cGy 3 sessions. She is now ready for 3-D simulation. She will receive concomitant chemotherapy in this constitutes a special treatment procedure because the increased GI toxicity and hematologic toxicity requiring additional time/management.

## 2013-10-18 ENCOUNTER — Encounter: Payer: Self-pay | Admitting: Radiation Oncology

## 2013-10-18 ENCOUNTER — Telehealth: Payer: Self-pay | Admitting: Medical Oncology

## 2013-10-18 DIAGNOSIS — Z510 Encounter for antineoplastic radiation therapy: Secondary | ICD-10-CM | POA: Diagnosis not present

## 2013-10-18 NOTE — Telephone Encounter (Signed)
Per MD, confirmed that patient has received Xeloda and knows to start the day of starting radiation treatment. Informed patient to take last (stop) warfarin on Sunday 10/13 and to start Aspirin , 81 mg daily, Monday 10/14th. Had patient repeat instructions back to me. Patient gave verbal understanding. Knows to call office with further questions or concerns.

## 2013-10-18 NOTE — Progress Notes (Signed)
3-D simulation note: The patient completed 3-D simulation for treatment to her pelvis/rectum. She set up a 4 field technique. 4 separate multileaf collimators are designed to conform the field. She is setup to AP, PA, and right/left lateral fields. Dose fine histograms were obtained for the target structures/CTV and also avoidance structures including the bladder and small bowel. We met our departmental guidelines. I prescribing 4500 cGy in 25 sessions utilizing mixed 10 MV/15 MV photons. I requesting daily AlignRT for image guidance. She will receive concomitant chemotherapy in this constitutes a special treatment procedure because of the expected degree of increased GI toxicity and hematologic toxicity. 

## 2013-10-18 NOTE — Telephone Encounter (Signed)
VMOM: Patient requesting to know when to stop warfarin and start aspirin?  LOV 09/02  Patient to start radiation 09/16  F/U with Dr Alen Blew  09/18  Mssg given to MD for review.

## 2013-10-21 DIAGNOSIS — Z510 Encounter for antineoplastic radiation therapy: Secondary | ICD-10-CM | POA: Diagnosis not present

## 2013-10-23 ENCOUNTER — Other Ambulatory Visit: Payer: Self-pay | Admitting: Physician Assistant

## 2013-10-23 DIAGNOSIS — C20 Malignant neoplasm of rectum: Secondary | ICD-10-CM

## 2013-10-24 ENCOUNTER — Other Ambulatory Visit: Payer: Self-pay | Admitting: Oncology

## 2013-10-24 ENCOUNTER — Ambulatory Visit (HOSPITAL_COMMUNITY)
Admission: RE | Admit: 2013-10-24 | Discharge: 2013-10-24 | Disposition: A | Discharge status: Home / Self Care | Payer: Medicare Other | Source: Ambulatory Visit | Attending: Oncology | Admitting: Oncology

## 2013-10-24 ENCOUNTER — Ambulatory Visit (HOSPITAL_COMMUNITY)
Admission: RE | Admit: 2013-10-24 | Discharge: 2013-10-24 | Disposition: A | Discharge status: Home / Self Care | Payer: Medicare Other | Source: Ambulatory Visit | Attending: Physician Assistant | Admitting: Physician Assistant

## 2013-10-24 ENCOUNTER — Ambulatory Visit
Admission: RE | Admit: 2013-10-24 | Discharge: 2013-10-24 | Disposition: A | Discharge status: Home / Self Care | Payer: Medicare Other | Source: Ambulatory Visit | Attending: Radiation Oncology | Admitting: Radiation Oncology

## 2013-10-24 ENCOUNTER — Other Ambulatory Visit: Payer: Self-pay | Admitting: Physician Assistant

## 2013-10-24 ENCOUNTER — Encounter (HOSPITAL_COMMUNITY): Payer: Self-pay

## 2013-10-24 DIAGNOSIS — C20 Malignant neoplasm of rectum: Secondary | ICD-10-CM

## 2013-10-24 DIAGNOSIS — Z510 Encounter for antineoplastic radiation therapy: Secondary | ICD-10-CM | POA: Diagnosis not present

## 2013-10-24 MED ORDER — IOHEXOL 300 MG/ML  SOLN
100.0000 mL | Freq: Once | INTRAMUSCULAR | Status: AC | PRN
  Administered 2013-10-24: 100 mL via INTRAVENOUS

## 2013-10-24 MED ORDER — GADOBENATE DIMEGLUMINE 529 MG/ML IV SOLN
15.0000 mL | Freq: Once | INTRAVENOUS | Status: AC | PRN
  Administered 2013-10-24: 12 mL via INTRAVENOUS

## 2013-10-24 NOTE — Progress Notes (Signed)
Simulation verification note: The patient underwent simulation verification for treatment to her pelvis. Her isocenter is in good position and the multileaf collimators contoured the treatment volume appropriately.

## 2013-10-25 ENCOUNTER — Ambulatory Visit
Admission: RE | Admit: 2013-10-25 | Discharge: 2013-10-25 | Disposition: A | Discharge status: Home / Self Care | Payer: Medicare Other | Source: Ambulatory Visit | Attending: Radiation Oncology | Admitting: Radiation Oncology

## 2013-10-25 DIAGNOSIS — C20 Malignant neoplasm of rectum: Secondary | ICD-10-CM

## 2013-10-25 DIAGNOSIS — Z510 Encounter for antineoplastic radiation therapy: Secondary | ICD-10-CM | POA: Diagnosis not present

## 2013-10-25 NOTE — Progress Notes (Signed)
Patient education completed with patient and son. Gave pt "Radiation and You" booklet w/all pertinent information marked and discussed, re: diarrhea/management, rectal irritation/care, fatigue, skin irritation/management, urinary/bladder irritation/care, nutrition, pain. Will schedule pt to see nutritionist; she requests to see her weekly. Patient and son verbalized understanding of above.

## 2013-10-26 ENCOUNTER — Ambulatory Visit
Admission: RE | Admit: 2013-10-26 | Discharge: 2013-10-26 | Disposition: A | Discharge status: Home / Self Care | Payer: Medicare Other | Source: Ambulatory Visit | Attending: Radiation Oncology | Admitting: Radiation Oncology

## 2013-10-26 DIAGNOSIS — Z510 Encounter for antineoplastic radiation therapy: Secondary | ICD-10-CM | POA: Diagnosis not present

## 2013-10-27 ENCOUNTER — Ambulatory Visit (HOSPITAL_BASED_OUTPATIENT_CLINIC_OR_DEPARTMENT_OTHER): Payer: Medicare Other | Admitting: Oncology

## 2013-10-27 ENCOUNTER — Ambulatory Visit
Admission: RE | Admit: 2013-10-27 | Discharge: 2013-10-27 | Disposition: A | Discharge status: Home / Self Care | Payer: Medicare Other | Source: Ambulatory Visit | Attending: Radiation Oncology | Admitting: Radiation Oncology

## 2013-10-27 ENCOUNTER — Telehealth: Payer: Self-pay | Admitting: Oncology

## 2013-10-27 VITALS — BP 123/45 | HR 84 | Temp 98.0°F | Resp 20 | Ht 67.0 in | Wt 136.3 lb

## 2013-10-27 DIAGNOSIS — Z86711 Personal history of pulmonary embolism: Secondary | ICD-10-CM

## 2013-10-27 DIAGNOSIS — K7689 Other specified diseases of liver: Secondary | ICD-10-CM

## 2013-10-27 DIAGNOSIS — Z510 Encounter for antineoplastic radiation therapy: Secondary | ICD-10-CM | POA: Diagnosis not present

## 2013-10-27 DIAGNOSIS — C20 Malignant neoplasm of rectum: Secondary | ICD-10-CM

## 2013-10-27 NOTE — Progress Notes (Signed)
Hematology and Oncology Follow Up Visit  Bethany [name] [id number] [birth year] 80 y.o. 10/27/2013 3:34 PM Bethany [name], MDKim, Bethany Rinks, MD   Principle Diagnosis: 80 year old woman with rectal adenocarcinoma diagnosed in August of 2015. Her staging by EUS his T3 N0. MRI of the liver showed potential hepatic metastasis.   Current therapy: Radiation therapy concomitantly with oral Xeloda at 500 mg twice a day started on 10/25/2013.  Interim History:  Bethany [name] presents today for a followup visit. Since her last visit, she started radiation therapy with oral Xeloda with very little complications. She is reporting some mild fatigue and frequent urination but otherwise no symptomatology. She does not report any diarrhea. She does not report any hand-foot syndrome or any skin irritation. So far she is able to have of reasonable quality of life. She has not had any headaches or blurry vision. She does not report any syncope or seizures. She does not report any chest pain, shortness of breath, cough or hemoptysis. She does not report any palpitation, orthopnea or PND. She does not report any leg edema. She is not abort any nausea, vomiting, abdominal pain, constipation or obstruction. She does not report any frequency urgency or hesitancy. She is not abort any skeletal complaints. She is able to perform his activities of daily living and lives independently with her nephew. She does not drive as of late. Rest of the review of systems unremarkable.     Medications: I have reviewed the patient's current medications.  Current Outpatient Prescriptions  Medication Sig Dispense Refill  . capecitabine (XELODA) 500 MG tablet One tablet twice a day Monday to Friday with radiation therapy to start on 9/14.  60 tablet  0  . cholecalciferol (VITAMIN D) 1000 UNITS tablet Take 2,000 Units by mouth daily.      Marland Kitchen levothyroxine (SYNTHROID, LEVOTHROID) 25 MCG tablet Take 25 mcg by mouth See admin instructions. Only take on Monday,  Wednesday, Friday and Sunday.      . levothyroxine (SYNTHROID, LEVOTHROID) 50 MCG tablet Take 50 mcg by mouth daily before breakfast.      . olmesartan (BENICAR) 40 MG tablet Take 40 mg by mouth every morning.       . warfarin (COUMADIN) 5 MG tablet Take 5 mg by mouth daily.       No current facility-administered medications for this visit.     Allergies:  Allergies  Allergen Reactions  . Cataflam [Diclofenac] Other (See Comments)    Unknown; patient is unaware of allergy.  . Cephalosporins Shortness Of Breath  . Erythromycin Shortness Of Breath  . Keflex [Cephalexin] Shortness Of Breath  . Codeine Nausea And Vomiting  . Darvocet [Propoxyphene N-Acetaminophen] Other (See Comments)    Unknown; patient is unaware of allergy  . Naproxen Other (See Comments)    Unknown; patient is unaware of allergy  . Penicillins Other (See Comments)    Patient states that she CAN take this; unaware of allergy  . Synalgos-Dc [Aspirin-Caff-Dihydrocodeine] Other (See Comments)    Unknown; patient is unaware of allergy  . Ultram [Tramadol] Other (See Comments)    Unknown; patient is unaware of allergy    Past Medical History, Surgical history, Social history, and Family History were reviewed and updated.   Physical Exam: Blood pressure 123/45, pulse 84, temperature 98 F (36.7 C), temperature source Oral, resp. rate 20, height 5\' 7"  (1.702 m), weight 136 lb 4.8 oz (61.825 kg), SpO2 100.00%. ECOG: 1 General appearance: alert and cooperative Head: Normocephalic, without obvious abnormality  Neck: no adenopathy Lymph nodes: Cervical, supraclavicular, and axillary nodes normal. Heart:regular rate and rhythm, S1, S2 normal, no murmur, click, rub or gallop Lung:chest clear, no wheezing, rales, normal symmetric air entry, Heart exam - S1, S2 normal, no murmur, no gallop, rate regular Abdomin: soft, non-tender, without masses or organomegaly EXT:no erythema, induration, or nodules   Lab Results: Lab  Results  Component Value Date   WBC 5.1 10/11/2013   HGB 11.3* 10/11/2013   HCT 34.9 10/11/2013   MCV 88.8 10/11/2013   PLT 214 10/11/2013     Chemistry      Component Value Date/Time   NA 141 10/11/2013 1313   NA 140 01/02/2012 0000   K 3.8 10/11/2013 1313   K 3.2* 01/02/2012 0000   CL 102 01/02/2012 0000   CO2 27 10/11/2013 1313   CO2 24 01/02/2012 0000   BUN 19.2 10/11/2013 1313   BUN 20 01/02/2012 0000   CREATININE 0.7 10/11/2013 1313   CREATININE 0.82 01/02/2012 0000      Component Value Date/Time   CALCIUM 9.6 10/11/2013 1313   CALCIUM 9.4 01/02/2012 0000   ALKPHOS 84 10/11/2013 1313   ALKPHOS 93 01/01/2012 1400   AST 16 10/11/2013 1313   AST 18 01/01/2012 1400   ALT 17 10/11/2013 1313   ALT 14 01/01/2012 1400   BILITOT 0.54 10/11/2013 1313   BILITOT 0.9 01/01/2012 1400       Radiological Studies: FINDINGS:  Liver: In the medial aspect of the right hepatic lobe (segment VI),  there is an 19 mm rounded lesion which is hyperintense on T2  weighted imaging (image 25, series 3). No additional hepatic lesions  on the T2 weighted sequence. The lesion is hypo intense on T1  weighted imaging and demonstrates post-contrast peripheral  enhancement on the arterial phase imaging (series 1101). The lesion  demonstrates some central enhancement on the more delayed vascular  sequences (series 1102). No additional enhancing lesions are  detected within the liver parenchyma.  The gallbladder, common bile duct, and biliary tree are normal. No  or the pancreatic parenchyma is normal. No duct dilatation. The  spleen and adrenal glands are normal. No enhancing renal lesion.  There are parapelvic renal cysts.  The stomach and limited view of the bowel are unremarkable. No  retroperitoneal periportal lymphadenopathy.  IMPRESSION:  Enhancing lesion in right hepatic lobe has imaging findings most  consistent with hepatic metastasis.   Impression and Plan:  80 year old woman with the following issues:   1. Rectal cancer presented with hematochezia and status post colonoscopy and EUS. The care of by EUS criteria was 4 cm in length around 4-5 cm from the anal verge. The staging was T3 N0 M1 with possible liver metastasis. She is currently receiving radiation therapy with Xeloda and tolerating it well. The plan is to continue the current dose of Xeloda with radiation throughout her course. She is taking Xeloda 500 mg twice a day Monday through Friday throughout her radiation.  Upon completion of radiation and chemotherapy she will be reevaluated for possible surgery. I'm not sure she would be a reasonable candidate for surgery but she will be evaluated by Dr. Johney Maine in the near future.  2. 19 mm lesion in the right hepatic lobe. This could represent metastatic disease from her rectal cancer. My recommendation is to address this upon the completion of her radiation and Xeloda. Options would include liver directed therapy or radiofrequency ablation as well as systemic chemotherapy.   3. History  of pulmonary embolism: He appears to have a massive unprovoked pulmonary embolism but that was over 2 years ago. Her followup CT scan did not show any blood clots. She is off warfarin for the time being and currently on aspirin while she is on Xeloda  4. Followup: On 9/28 with mid level. 10/15 MD  Zola Button, MD 9/18/20153:34 PM

## 2013-10-27 NOTE — Telephone Encounter (Signed)
Pt confirmed labs/ov per 09/18 POF, gave pt AVS....KJ °

## 2013-10-30 ENCOUNTER — Ambulatory Visit
Admission: RE | Admit: 2013-10-30 | Discharge: 2013-10-30 | Disposition: A | Discharge status: Home / Self Care | Payer: Medicare Other | Source: Ambulatory Visit | Attending: Radiation Oncology | Admitting: Radiation Oncology

## 2013-10-30 ENCOUNTER — Encounter: Payer: Self-pay | Admitting: Radiation Oncology

## 2013-10-30 VITALS — BP 113/55 | HR 74 | Temp 97.6°F | Resp 20 | Wt 133.8 lb

## 2013-10-30 DIAGNOSIS — C20 Malignant neoplasm of rectum: Secondary | ICD-10-CM

## 2013-10-30 DIAGNOSIS — Z510 Encounter for antineoplastic radiation therapy: Secondary | ICD-10-CM | POA: Diagnosis not present

## 2013-10-30 NOTE — Progress Notes (Signed)
Patient denies pain, does report fatigue, occasional loss of appetite. She is scheduled to see nutritionist on 11/07/13. Patient states this morning she had abdominal cramping and loose BMs, took Imodium 2 tabs and had one soft to loose stool afterwards. Discussed taking Imodium as directed on package. Patient is taking Xeloda daily.  Reminded pt and nephew of her appointment with nutritionist.

## 2013-10-30 NOTE — Progress Notes (Signed)
CC: Dr. Michael Boston, Dr. Zola Button  Weekly Management Note:  Site: Rectum/pelvis Current Dose:  720  cGy Projected Dose: 4540  JWJ followed by possible boost of 540 cGy in 3 sessions (question preoperative)  Narrative: The patient is seen today for routine under treatment assessment. CBCT/MVCT images/port films were reviewed. The chart was reviewed.   She still doing well but she's been having  abdominal cramping and loosening of her bowels. She took 2 Imodium tablets over the weekend. She is taking Colace daily. She had rectal bleeding this morning. She is also having more urinary frequency, but no dysuria. She started her Xeloda last Thursday. She has had only had 3 fractions of radiation therapy. She'll see Dr. Michael Boston for surgical evaluation in the near future, according to the patient.  Physical Examination:  Filed Vitals:   10/30/13 1344  BP: 113/55  Pulse: 74  Temp: 97.6 F (36.4 C)  Resp: 20  .  Weight: 133 lb 12.8 oz (60.691 kg). Pelvic examination is not performed today.  Laboratory data: Lab Results  Component Value Date   WBC 5.1 10/11/2013   HGB 11.3* 10/11/2013   HCT 34.9 10/11/2013   MCV 88.8 10/11/2013   PLT 214 10/11/2013     Impression: Tolerating radiation therapy well. I cautioned her about taking more Imodium than necessary to avoid constipation/obstruction. I do not think that her bowel issues are all related to Xeloda and/or radiation therapy which began late last week. She is to continue with Colace on a daily basis. Dr. Johney Maine will see her in the near future to determine her suitability for surgery. Of note is that she does have a small lesion in the liver which may simply be followed by followup imaging a prior to any surgery.  Plan: Continue radiation therapy as planned.

## 2013-10-31 ENCOUNTER — Ambulatory Visit
Admission: RE | Admit: 2013-10-31 | Discharge: 2013-10-31 | Disposition: A | Discharge status: Home / Self Care | Payer: Medicare Other | Source: Ambulatory Visit | Attending: Radiation Oncology | Admitting: Radiation Oncology

## 2013-10-31 DIAGNOSIS — Z510 Encounter for antineoplastic radiation therapy: Secondary | ICD-10-CM | POA: Diagnosis not present

## 2013-11-01 ENCOUNTER — Encounter: Payer: Self-pay | Admitting: *Deleted

## 2013-11-01 ENCOUNTER — Ambulatory Visit
Admission: RE | Admit: 2013-11-01 | Discharge: 2013-11-01 | Disposition: A | Discharge status: Home / Self Care | Payer: Medicare Other | Source: Ambulatory Visit | Attending: Radiation Oncology | Admitting: Radiation Oncology

## 2013-11-01 DIAGNOSIS — Z510 Encounter for antineoplastic radiation therapy: Secondary | ICD-10-CM | POA: Diagnosis not present

## 2013-11-01 NOTE — Progress Notes (Signed)
[place] Psychosocial Distress Screening Clinical Social Work  Clinical Social Work was referred by distress screening protocol.  The patient scored a 6 on the Psychosocial Distress Thermometer which indicates moderate distress. Clinical Social Worker attempted to contact patient to assess for distress and other psychosocial needs.   ONCBCN DISTRESS SCREENING 10/12/2013  Screening Type Initial Screening  Elta [place] the number that describes how much distress you have been experiencing in the past week 6  Emotional problem type Nervousness/Anxiety;Adjusting to illness  Physical Problem type Sleep/insomnia;Constipation/diarrhea  Other nervousness, anxiety listed as most distressing    Clinical Social Worker follow up needed: Yes.    If yes, follow up plan:  CSW unable to reach patient, left voicemail to return call when convenient.  CSW will await patient call.  Polo Riley, MSW, LCSW, OSW-C Clinical Social Worker [facility] [phone]

## 2013-11-02 ENCOUNTER — Ambulatory Visit
Admission: RE | Admit: 2013-11-02 | Discharge: 2013-11-02 | Disposition: A | Discharge status: Home / Self Care | Payer: Medicare Other | Source: Ambulatory Visit | Attending: Radiation Oncology | Admitting: Radiation Oncology

## 2013-11-02 DIAGNOSIS — Z510 Encounter for antineoplastic radiation therapy: Secondary | ICD-10-CM | POA: Diagnosis not present

## 2013-11-03 ENCOUNTER — Ambulatory Visit
Admission: RE | Admit: 2013-11-03 | Discharge: 2013-11-03 | Disposition: A | Discharge status: Home / Self Care | Payer: Medicare Other | Source: Ambulatory Visit | Attending: Radiation Oncology | Admitting: Radiation Oncology

## 2013-11-03 DIAGNOSIS — Z510 Encounter for antineoplastic radiation therapy: Secondary | ICD-10-CM | POA: Diagnosis not present

## 2013-11-06 ENCOUNTER — Ambulatory Visit
Admission: RE | Admit: 2013-11-06 | Discharge: 2013-11-06 | Disposition: A | Discharge status: Home / Self Care | Payer: Medicare Other | Source: Ambulatory Visit | Attending: Radiation Oncology | Admitting: Radiation Oncology

## 2013-11-06 ENCOUNTER — Other Ambulatory Visit: Payer: Medicare Other

## 2013-11-06 ENCOUNTER — Ambulatory Visit (HOSPITAL_BASED_OUTPATIENT_CLINIC_OR_DEPARTMENT_OTHER): Payer: Medicare Other | Admitting: Oncology

## 2013-11-06 ENCOUNTER — Encounter: Payer: Self-pay | Admitting: Oncology

## 2013-11-06 ENCOUNTER — Encounter: Payer: Medicare Other | Admitting: Nutrition

## 2013-11-06 ENCOUNTER — Telehealth: Payer: Self-pay | Admitting: Oncology

## 2013-11-06 ENCOUNTER — Ambulatory Visit (HOSPITAL_BASED_OUTPATIENT_CLINIC_OR_DEPARTMENT_OTHER): Payer: Medicare Other

## 2013-11-06 VITALS — BP 127/54 | HR 76 | Temp 98.0°F | Resp 12

## 2013-11-06 VITALS — BP 146/55 | HR 86 | Temp 97.6°F | Resp 18 | Ht 67.0 in | Wt 134.4 lb

## 2013-11-06 DIAGNOSIS — R5383 Other fatigue: Secondary | ICD-10-CM

## 2013-11-06 DIAGNOSIS — C20 Malignant neoplasm of rectum: Secondary | ICD-10-CM

## 2013-11-06 DIAGNOSIS — K7689 Other specified diseases of liver: Secondary | ICD-10-CM

## 2013-11-06 DIAGNOSIS — R112 Nausea with vomiting, unspecified: Secondary | ICD-10-CM

## 2013-11-06 DIAGNOSIS — Z86711 Personal history of pulmonary embolism: Secondary | ICD-10-CM

## 2013-11-06 DIAGNOSIS — R5381 Other malaise: Secondary | ICD-10-CM

## 2013-11-06 DIAGNOSIS — Z510 Encounter for antineoplastic radiation therapy: Secondary | ICD-10-CM | POA: Diagnosis not present

## 2013-11-06 LAB — CBC WITH DIFFERENTIAL/PLATELET
BASO%: 0.4 % (ref 0.0–2.0)
Basophils Absolute: 0 10*3/uL (ref 0.0–0.1)
EOS%: 2.3 % (ref 0.0–7.0)
Eosinophils Absolute: 0.1 10*3/uL (ref 0.0–0.5)
HEMATOCRIT: 32.1 % — AB (ref 34.8–46.6)
HEMOGLOBIN: 10.3 g/dL — AB (ref 11.6–15.9)
LYMPH%: 9.7 % — ABNORMAL LOW (ref 14.0–49.7)
MCH: 29 pg (ref 25.1–34.0)
MCHC: 32 g/dL (ref 31.5–36.0)
MCV: 90.7 fL (ref 79.5–101.0)
MONO#: 0.3 10*3/uL (ref 0.1–0.9)
MONO%: 8.1 % (ref 0.0–14.0)
NEUT%: 79.5 % — AB (ref 38.4–76.8)
NEUTROS ABS: 2.9 10*3/uL (ref 1.5–6.5)
Platelets: 158 10*3/uL (ref 145–400)
RBC: 3.54 10*6/uL — ABNORMAL LOW (ref 3.70–5.45)
RDW: 13.6 % (ref 11.2–14.5)
WBC: 3.6 10*3/uL — ABNORMAL LOW (ref 3.9–10.3)
lymph#: 0.4 10*3/uL — ABNORMAL LOW (ref 0.9–3.3)

## 2013-11-06 LAB — COMPREHENSIVE METABOLIC PANEL (CC13)
ALBUMIN: 3.3 g/dL — AB (ref 3.5–5.0)
ALT: 8 U/L (ref 0–55)
AST: 12 U/L (ref 5–34)
Alkaline Phosphatase: 69 U/L (ref 40–150)
Anion Gap: 8 mEq/L (ref 3–11)
BUN: 17.2 mg/dL (ref 7.0–26.0)
CALCIUM: 9.5 mg/dL (ref 8.4–10.4)
CHLORIDE: 104 meq/L (ref 98–109)
CO2: 31 mEq/L — ABNORMAL HIGH (ref 22–29)
CREATININE: 0.8 mg/dL (ref 0.6–1.1)
Glucose: 125 mg/dl (ref 70–140)
POTASSIUM: 3.4 meq/L — AB (ref 3.5–5.1)
Sodium: 142 mEq/L (ref 136–145)
Total Bilirubin: 0.53 mg/dL (ref 0.20–1.20)
Total Protein: 6 g/dL — ABNORMAL LOW (ref 6.4–8.3)

## 2013-11-06 MED ORDER — ONDANSETRON HCL 8 MG PO TABS: 8.0000 mg | ORAL_TABLET | Freq: Three times a day (TID) | ORAL | Status: DC | PRN

## 2013-11-06 NOTE — Progress Notes (Signed)
Hematology and Oncology Follow Up Visit  Bethany [name] [id number] [birth year] 80 y.o. 11/06/2013 3:17 PM [name], MDKim, [name], MD   Principle Diagnosis: 80 year old woman with rectal adenocarcinoma diagnosed in August of 2015. Her staging by EUS his T3 N0. MRI of the liver showed potential hepatic metastasis.   Current therapy: Radiation therapy concomitantly with oral Xeloda at 500 mg twice a day started on 10/25/2013.  Interim History:  Ms. Bethany [name] presents today for a followup visit. She continues on radiation therapy with oral Xeloda with very little complications. She is reporting some mild fatigue and nausea without vomiting. She does not report any diarrhea. She does not report any hand-foot syndrome or any skin irritation. So far she is able to have of reasonable quality of life. She has not had any headaches or blurry vision. She does not report any syncope or seizures. She does not report any chest pain, shortness of breath, cough or hemoptysis. She does not report any palpitation, orthopnea or PND. She does not report any leg edema. She denies abdominal pain, constipation or obstruction. She does not report any frequency urgency or hesitancy. She is not abort any skeletal complaints. She is able to perform his activities of daily living and lives independently with her nephew. She does not drive as of late. Rest of the review of systems unremarkable.     Medications: I have reviewed the patient's current medications.  Current Outpatient Prescriptions  Medication Sig Dispense Refill  . aspirin 81 MG tablet Take 81 mg by mouth daily.      . capecitabine (XELODA) 500 MG tablet One tablet twice a day Monday to Friday with radiation therapy to start on 9/14.  60 tablet  0  . cholecalciferol (VITAMIN D) 1000 UNITS tablet Take 2,000 Units by mouth daily.      Marland Kitchen levothyroxine (SYNTHROID, LEVOTHROID) 25 MCG tablet Take 25 mcg by mouth See admin instructions. Only take on Monday, Wednesday,  Friday and Sunday.      . levothyroxine (SYNTHROID, LEVOTHROID) 50 MCG tablet Take 50 mcg by mouth daily before breakfast.      . olmesartan (BENICAR) 40 MG tablet Take 40 mg by mouth every morning.       . ondansetron (ZOFRAN) 8 MG tablet Take 1 tablet (8 mg total) by mouth every 8 (eight) hours as needed for nausea or vomiting.  20 tablet  1  . warfarin (COUMADIN) 5 MG tablet Take 5 mg by mouth daily.       No current facility-administered medications for this visit.     Allergies:  Allergies  Allergen Reactions  . Cataflam [Diclofenac] Other (See Comments)    Unknown; patient is unaware of allergy.  . Cephalosporins Shortness Of Breath  . Erythromycin Shortness Of Breath  . Keflex [Cephalexin] Shortness Of Breath  . Codeine Nausea And Vomiting  . Darvocet [Propoxyphene N-Acetaminophen] Other (See Comments)    Unknown; patient is unaware of allergy  . Naproxen Other (See Comments)    Unknown; patient is unaware of allergy  . Penicillins Other (See Comments)    Patient states that she CAN take this; unaware of allergy  . Synalgos-Dc [Aspirin-Caff-Dihydrocodeine] Other (See Comments)    Unknown; patient is unaware of allergy  . Ultram [Tramadol] Other (See Comments)    Unknown; patient is unaware of allergy    Past Medical History, Surgical history, Social history, and Family History were reviewed and updated.   Physical Exam: Blood pressure 146/55, pulse 86, temperature 97.6  F (36.4 C), temperature source Oral, resp. rate 18, height 5\' 7"  (1.702 m), weight 134 lb 6.4 oz (60.963 kg), SpO2 99.00%. ECOG: 1 General appearance: alert and cooperative Head: Normocephalic, without obvious abnormality Neck: no adenopathy Lymph nodes: Cervical, supraclavicular, and axillary nodes normal. Heart:regular rate and rhythm, S1, S2 normal, no murmur, click, rub or gallop Lung:chest clear, no wheezing, rales, normal symmetric air entry, Heart exam - S1, S2 normal, no murmur, no gallop,  rate regular Abdomen: soft, non-tender, without masses or organomegaly EXT:no erythema, induration, or nodules   Lab Results: Lab Results  Component Value Date   WBC 3.6* 11/06/2013   HGB 10.3* 11/06/2013   HCT 32.1* 11/06/2013   MCV 90.7 11/06/2013   PLT 158 11/06/2013     Chemistry      Component Value Date/Time   NA 142 11/06/2013 1410   NA 140 01/02/2012 0000   K 3.4* 11/06/2013 1410   K 3.2* 01/02/2012 0000   CL 102 01/02/2012 0000   CO2 31* 11/06/2013 1410   CO2 24 01/02/2012 0000   BUN 17.2 11/06/2013 1410   BUN 20 01/02/2012 0000   CREATININE 0.8 11/06/2013 1410   CREATININE 0.82 01/02/2012 0000      Component Value Date/Time   CALCIUM 9.5 11/06/2013 1410   CALCIUM 9.4 01/02/2012 0000   ALKPHOS 69 11/06/2013 1410   ALKPHOS 93 01/01/2012 1400   AST 12 11/06/2013 1410   AST 18 01/01/2012 1400   ALT 8 11/06/2013 1410   ALT 14 01/01/2012 1400   BILITOT 0.53 11/06/2013 1410   BILITOT 0.9 01/01/2012 1400      Impression and Plan:  80 year old woman with the following issues:  1. Rectal cancer presented with hematochezia and status post colonoscopy and EUS. The care of by EUS criteria was 4 cm in length around 4-5 cm from the anal verge. The staging was T3 N0 M1 with possible liver metastasis. She is currently receiving radiation therapy with Xeloda and tolerating it well. The plan is to continue the current dose of Xeloda with radiation throughout her course. She is taking Xeloda 500 mg twice a day Monday through Friday throughout her radiation.  Upon completion of radiation and chemotherapy she will be reevaluated for possible surgery. I'm not sure she would be a reasonable candidate for surgery but she will be evaluated by Dr. Johney Maine in the near future.  2. 19 mm lesion in the right hepatic lobe. This could represent metastatic disease from her rectal cancer. My recommendation is to address this upon the completion of her radiation and Xeloda. Options would include liver  directed therapy or radiofrequency ablation as well as systemic chemotherapy.   3. History of pulmonary embolism: He appears to have a massive unprovoked pulmonary embolism but that was over 2 years ago. Her followup CT scan did not show any blood clots. She is off warfarin for the time being and currently on aspirin while she is on Xeloda  4. Nausea prophylaxis. I have prescribed Zofran for her to use as needed.   5. Followup: On 10/15 with MD  Mikey Bussing, DNP, AGPCNP-BC 9/28/20153:17 PM

## 2013-11-06 NOTE — Telephone Encounter (Signed)
Pt confirmed labs/ov per 09/28 POF, gave pt AVS...... KJ °

## 2013-11-06 NOTE — Progress Notes (Signed)
Weekly Management Note:  Site: Pelvis/rectum Current Dose:  1620  cGy Projected Dose: 4500  cGy  Narrative: The patient is seen today for routine under treatment assessment. CBCT/MVCT images/port films were reviewed. The chart was reviewed.   She is without new complaints today. She is having loose stools but no real diarrhea. She is taking Colace as a stool softener. She is also using A & D ointment along her perirectal region. She continues with her by mouth Xeloda.  Physical Examination: There were no vitals filed for this visit..  Weight:  . No change. Rectal examination not performed.  Impression: Tolerating radiation therapy well.  Plan: Continue radiation therapy as planned.

## 2013-11-07 ENCOUNTER — Telehealth: Payer: Self-pay | Admitting: *Deleted

## 2013-11-07 ENCOUNTER — Ambulatory Visit
Admission: RE | Admit: 2013-11-07 | Discharge: 2013-11-07 | Disposition: A | Discharge status: Home / Self Care | Payer: Medicare Other | Source: Ambulatory Visit | Attending: Radiation Oncology | Admitting: Radiation Oncology

## 2013-11-07 ENCOUNTER — Ambulatory Visit: Payer: Medicare Other | Admitting: Nutrition

## 2013-11-07 ENCOUNTER — Other Ambulatory Visit (INDEPENDENT_AMBULATORY_CARE_PROVIDER_SITE_OTHER): Payer: Self-pay | Admitting: Surgery

## 2013-11-07 DIAGNOSIS — Z510 Encounter for antineoplastic radiation therapy: Secondary | ICD-10-CM | POA: Diagnosis not present

## 2013-11-07 NOTE — Progress Notes (Signed)
Patient seen in nursing following radiation treatment. She states she took Colace 1 tab last night at 7:30 p m then began having diarrhea. She reports several liquid stools, dark brown. She states she later took Imodium 1 tablet. She states that her stools became more soft than liquid. She states she continued to have stools until 10:30 pm last night, and one stool was bloody. She states she drank tea last night. She states she did become nauseated at one time, but it resolved without her taking Zofran. She states today she has had 2 small stools that were formed. She states that she passed blood rectally once this morning, none since. She states today she has had tea, grape juice, ginger ale, and 1 can of Carnation Instant breakfast. She saw nutritionist today and was given instructions for foods and drinks to help prevent diarrhea, and was told to drink 3 nutritional supplemnts daily. She was given samples.  Pt's VS taken sitting/standing. She denies dizziness, pain. Patient states she wants to eat. Informed pt and her nephew this RN will discuss with Dr Valere Dross and call her later today with his instrucitons. Advised she not take Colace or Imodium today, eat bland foods, drink fluids and follow nutritionist's recommendations. Requested pt return to nursing tomorrow for VS, reassessment of her status. Patient and nephew verbalized understanding and agreement with above plan.

## 2013-11-07 NOTE — Progress Notes (Signed)
80 year old female diagnosed with rectal cancer in August 2015.  She is a patient of Dr. Valere Dross.  Past medical history includes migraines, GERD, hypertension, hypercholesterolemia, PE, and hypothyroidism, and ovarian tumor.  Medications include Xeloda, vitamin D, Synthroid, and Coumadin.  Labs include potassium 3.2, glucose 100 on September 2.  Height: 67 inches. Weight: 134.4 pounds September 28. Usual body weight approximately 165 pounds March 2014. BMI: 21.04.  Patient presents to nutrition consult with her nephew.  She states she is not feeling well today.  She has had multiple stools beginning at around 7:30 last night.  She is confused as to how much Imodium she is supposed to take.  States she took one tablet.  Patient reports drinking Tea, grape juice, ginger ale, and Carnation breakfast today.  Nutrition diagnosis: Unintended weight loss related to new diagnosis of rectal cancer and inadequate oral intake as evidenced by 19% weight loss from usual body weight.  Intervention: Educated patient on strategies for eating to improve diarrhea.  Provided a fact sheet. Encouraged patient to increase fluids and beginning bland foods.  Recommended patient consume small, frequent meals and snacks.  Encouraged stool firming foods. Provided patient with samples of oral nutrition supplements along with coupons, and purchasing information. Questions were answered.  Teach back method used.  Contact information provided.  Monitoring, evaluation, goals: Patient will tolerate increased calories and protein along with fluids to minimize further weight loss.  Patient will see improvement in nutrition impact symptoms.  Next visit: Thursday, October 8, after radiation therapy.  **Disclaimer: This note was dictated with voice recognition software. Similar sounding words can inadvertently be transcribed and this note may contain transcription errors which may not have been corrected upon publication of  note.**

## 2013-11-07 NOTE — Telephone Encounter (Signed)
Discussed pt's visit in nursing today with Dr Valere Dross. Per Dr Valere Dross, pt to continue taking Colace 1 tab every morning. She may take Imodium 1 tab once each day following her first liquid diarrhea stool.   3:50 pm Spoke with patient. Advised her of Dr Charlton Amor instructions. She repeated all directions correctly. Reminded her this RN will see her tomorrow following treatment to FU with her. Pt verbalized understanding, agreement.

## 2013-11-08 ENCOUNTER — Ambulatory Visit
Admission: RE | Admit: 2013-11-08 | Discharge: 2013-11-08 | Disposition: A | Discharge status: Home / Self Care | Payer: Medicare Other | Source: Ambulatory Visit | Attending: Radiation Oncology | Admitting: Radiation Oncology

## 2013-11-08 DIAGNOSIS — Z510 Encounter for antineoplastic radiation therapy: Secondary | ICD-10-CM | POA: Diagnosis not present

## 2013-11-08 NOTE — Progress Notes (Signed)
Patient seen following radiation treatment for VS, son was with her. Her VS are stable, sitting and standing. She does take Benicar 40 mg daily; discussed keeping check on her BP as she goes through treatment in the event her medication dose needs to be adjusted. She states Benicar was prescribed by Dr Maudie Mercury. She states she has only had two small BMs today, normal consistency. She ate breakfast of grits, applesauce, drank Breeze juice this morning. She states she had slight nausea but did not take Zofran. She took Colace 1 tab this morning. She states she went to bed last night at 6 pm and slept all night. Advised she come to nursing later this week if her status changes, otherwise she will see Dr Valere Dross next Monday. Pt and son verbalized appreciation, understanding.

## 2013-11-09 ENCOUNTER — Ambulatory Visit
Admission: RE | Admit: 2013-11-09 | Discharge: 2013-11-09 | Disposition: A | Discharge status: Home / Self Care | Payer: Medicare Other | Source: Ambulatory Visit | Attending: Radiation Oncology | Admitting: Radiation Oncology

## 2013-11-09 DIAGNOSIS — C20 Malignant neoplasm of rectum: Secondary | ICD-10-CM | POA: Diagnosis not present

## 2013-11-09 DIAGNOSIS — Z510 Encounter for antineoplastic radiation therapy: Secondary | ICD-10-CM | POA: Insufficient documentation

## 2013-11-09 NOTE — Progress Notes (Addendum)
Patient in nursing with her nephew. She states she began having soft then watery stools last night from 8:30 - 10 pm. She states she took Imodium 1 tab x 1 with relief. She states she did not take Colace this morning,  "afraid it would cause diarrhea". She states she has "felt bad", tired and weak. Pt states she ate grits, whole banana, drank tea and juice this morning. She also ate dinner last night. She took her BP twice this morning: 7:41 am BP 102/55, HR 72; 11:12 am BP 100/52, HR 62. She states she did not take Benicar due to her low BP. She also states she felt lightheaded when she got out of the shower. Informed pt and nephew this RN will call Dr Maudie Mercury, her PCP to inform of her BP readings and call her with his response. Cautioned her about getting out of shower and suggested she have chair by shower to sit on for a few minutes directly out of the shower. Re educated pt on need to take Colace every morning per Dr Charlton Amor orders.  Patient and nephew verbalized understanding, agreement.  2:15 pm Spoke with Abigail Butts, Dr [name]'s office and informed her of pt's recent BP readings and weight. She stated she would give information to RN.

## 2013-11-10 ENCOUNTER — Telehealth: Payer: Self-pay | Admitting: *Deleted

## 2013-11-10 ENCOUNTER — Ambulatory Visit
Admission: RE | Admit: 2013-11-10 | Discharge: 2013-11-10 | Disposition: A | Discharge status: Home / Self Care | Payer: Medicare Other | Source: Ambulatory Visit | Attending: Radiation Oncology | Admitting: Radiation Oncology

## 2013-11-10 ENCOUNTER — Other Ambulatory Visit (INDEPENDENT_AMBULATORY_CARE_PROVIDER_SITE_OTHER): Payer: Self-pay | Admitting: Surgery

## 2013-11-10 DIAGNOSIS — Z510 Encounter for antineoplastic radiation therapy: Secondary | ICD-10-CM | POA: Diagnosis not present

## 2013-11-10 NOTE — Progress Notes (Signed)
Patient brought in her bottle of Colace. She is taking Colace 100 mg (docusate sodium) every morning. She states she has not had diarrhea in the past 24 hours. She states she "feels better today". She took her BP today, 6:50 am 101/51, HR 61; 10:21 am 101/52, HR 68. She states she did not get a call back from Dr Julianne Rice office, and she did not take her Benicar today. She states "I am afraid to take it." Advised she continue taking Colace 1 tab every morning and check her BP each day this weekend. Advised if her BP remains in the same low range, not to take her Benicar unless she hears otherwise from Dr Maudie Mercury. She will see Dr Rosalita Levan, and informed her if her BP remains low, this RN will call Dr Julianne Rice office again on Monday. Also advised her nephew with same instructions. Pt and nephew verbalized understanding, agreement. Reminded her nephew that [place] has dr on call and to call over weekend for any problems concerns. He verbalized understanding.

## 2013-11-10 NOTE — Telephone Encounter (Signed)
Left vm requesting patient bring her bottle of Colace so this RN can check ingredients for possible laxative. Requested pt call back to confirm she understood message; left call back name and number.

## 2013-11-13 ENCOUNTER — Ambulatory Visit
Admission: RE | Admit: 2013-11-13 | Discharge: 2013-11-13 | Disposition: A | Discharge status: Home / Self Care | Payer: Medicare Other | Source: Ambulatory Visit | Attending: Radiation Oncology | Admitting: Radiation Oncology

## 2013-11-13 VITALS — BP 121/74 | HR 59 | Temp 97.7°F | Resp 20 | Wt 133.9 lb

## 2013-11-13 DIAGNOSIS — C20 Malignant neoplasm of rectum: Secondary | ICD-10-CM

## 2013-11-13 DIAGNOSIS — Z510 Encounter for antineoplastic radiation therapy: Secondary | ICD-10-CM | POA: Diagnosis not present

## 2013-11-13 NOTE — Progress Notes (Signed)
Weekly Management Note:  Site: Rectum/pelvis Current Dose:  2520  cGy Projected Dose: 8588  FOY followed by rectal boost  Narrative: The patient is seen today for routine under treatment assessment. CBCT/MVCT images/port films were reviewed. The chart was reviewed.   She difficulty with diarrhea over the weekend. She is taking up to 2 Imodium a day. She became dehydrated and was told to stop her Benicar blood pressure medication by Dr. Maudie Mercury. She feels better today. She resumes her Xeloda during the week as a radiation sensitizer. We again discussed her low residue diet today. However, I do want her to continue with Colace just to make sure that we do not get into trouble with constipation from Imodium.  Physical Examination:  Filed Vitals:   11/13/13 1436  BP: 121/74  Pulse: 59  Temp:   Resp:   .  Weight: 133 lb 14.4 oz (60.737 kg). Her weight is stable. No change. Rectal examination not performed today.  Laboratory data: Lab Results  Component Value Date   WBC 3.6* 11/06/2013   HGB 10.3* 11/06/2013   HCT 32.1* 11/06/2013   MCV 90.7 11/06/2013   PLT 158 11/06/2013    Impression: Tolerating radiation therapy well. We talked about staying hydrated because of her diarrhea. She may take up to 2-3 Imodium tablets a day for frequent soft bowel movements or watery diarrhea. I gave her a low residue diet list today. I still want her to take one Colace a day.  Plan: Continue radiation therapy as planned.

## 2013-11-13 NOTE — Progress Notes (Signed)
Patient states she had diarrhea Sat and Sun, took Imodium 1 tab on Sunday. She has not had any diarrhea stools since but has had soft stools. She ate oatmeal, applesauce, toast, and drank Ensure, decaff tea, juice, water over the weekend. She states her appetite is not as good when she has diarrhea.  She has nutritionist appt this Thurs. She states Dr Maudie Mercury called her last Fri and instructed her to stop Benicar which she did. She states her BP was low over the weekend, and she felt dizzy. She states she feels good today, denies dizziness. She is using a shower chair when she bathes.

## 2013-11-14 ENCOUNTER — Ambulatory Visit
Admission: RE | Admit: 2013-11-14 | Discharge: 2013-11-14 | Disposition: A | Discharge status: Home / Self Care | Payer: Medicare Other | Source: Ambulatory Visit | Attending: Radiation Oncology | Admitting: Radiation Oncology

## 2013-11-14 DIAGNOSIS — Z510 Encounter for antineoplastic radiation therapy: Secondary | ICD-10-CM | POA: Diagnosis not present

## 2013-11-15 ENCOUNTER — Ambulatory Visit
Admission: RE | Admit: 2013-11-15 | Discharge: 2013-11-15 | Disposition: A | Discharge status: Home / Self Care | Payer: Medicare Other | Source: Ambulatory Visit | Attending: Radiation Oncology | Admitting: Radiation Oncology

## 2013-11-15 DIAGNOSIS — Z510 Encounter for antineoplastic radiation therapy: Secondary | ICD-10-CM | POA: Diagnosis not present

## 2013-11-15 NOTE — Progress Notes (Signed)
Pt seen by C Cheston RN for VS. Pt stated she had diarrhea last night, took Imodium x 1 with good results. She states she drank [employer] which is a milk based beverage. Per B Neff, nutritionist, pt should only drink Ensure, Boost or Breeze as nutritional supplements in addition to her meals. Pt and nephew given these instructions verbally and written per Birder Robson, RN.

## 2013-11-16 ENCOUNTER — Ambulatory Visit
Admission: RE | Admit: 2013-11-16 | Discharge: 2013-11-16 | Disposition: A | Discharge status: Home / Self Care | Payer: Medicare Other | Source: Ambulatory Visit | Attending: Radiation Oncology | Admitting: Radiation Oncology

## 2013-11-16 ENCOUNTER — Ambulatory Visit: Payer: Medicare Other | Admitting: Nutrition

## 2013-11-16 DIAGNOSIS — Z510 Encounter for antineoplastic radiation therapy: Secondary | ICD-10-CM | POA: Diagnosis not present

## 2013-11-16 NOTE — Progress Notes (Signed)
Patient returns for nutrition followup.  She is receiving radiation therapy for rectal cancer. Patient reports diarrhea continues on and off.  She noticed an increase in diarrhea after drinking [employer] instant breakfast.  She has since discontinued this product. Patient enjoys Berry breeze and vanilla boost plus.   Weight down slightly and documented as 133.9 pounds down from 134.4 pounds on September 28.  Nutrition diagnosis: Unintended weight loss continues.  Intervention: Educated patient on strategies for eating bland diet to improve diarrhea.  Patient has fact sheets. Encouraged patient to increase fluids and recommended patient consume 2 Berry breeze plus one vanilla boost daily.  Provided samples. Encouraged medication compliance per M.D.  Monitoring, evaluation, goals: Patient will tolerate increased calories, protein, and fluids to minimize further weight loss and improve quality-of-life and strength.  Next visit: Tuesday, October 20 after radiation therapy.  **Disclaimer: This note was dictated with voice recognition software. Similar sounding words can inadvertently be transcribed and this note may contain transcription errors which may not have been corrected upon publication of note.**

## 2013-11-17 ENCOUNTER — Ambulatory Visit
Admission: RE | Admit: 2013-11-17 | Discharge: 2013-11-17 | Disposition: A | Discharge status: Home / Self Care | Payer: Medicare Other | Source: Ambulatory Visit | Attending: Radiation Oncology | Admitting: Radiation Oncology

## 2013-11-17 DIAGNOSIS — Z510 Encounter for antineoplastic radiation therapy: Secondary | ICD-10-CM | POA: Diagnosis not present

## 2013-11-20 ENCOUNTER — Ambulatory Visit
Admission: RE | Admit: 2013-11-20 | Discharge: 2013-11-20 | Disposition: A | Discharge status: Home / Self Care | Payer: Medicare Other | Source: Ambulatory Visit | Attending: Radiation Oncology | Admitting: Radiation Oncology

## 2013-11-20 VITALS — BP 129/69 | HR 92 | Temp 97.5°F | Resp 20 | Wt 133.0 lb

## 2013-11-20 DIAGNOSIS — C20 Malignant neoplasm of rectum: Secondary | ICD-10-CM

## 2013-11-20 DIAGNOSIS — Z510 Encounter for antineoplastic radiation therapy: Secondary | ICD-10-CM | POA: Diagnosis not present

## 2013-11-20 NOTE — Progress Notes (Signed)
Weekly Management Note:  Site: Pelvis/rectum Current Dose:  3420  cGy Projected Dose: 4500  cGy followed by primary tumor boost of 540 cGy 3 sessions  Narrative: The patient is seen today for routine under treatment assessment. CBCT/MVCT images/port films were reviewed. The chart was reviewed.   She is doing reasonably well although she today's to have multiple soft stools a day, but no frank diarrhea. She did not take any Imodium over the weekend. She is fatigued. Her weight is down less than 1 pound over the past week. She is taking 1-2 nutritional supplements a day. She continues with her daily Xeloda.  Physical Examination:  Filed Vitals:   11/20/13 1400  BP: 129/69  Pulse: 92  Temp:   Resp:   .  Weight: 133 lb (60.328 kg). Abdomen is soft.  Laboratory data: Lab Results  Component Value Date   WBC 3.6* 11/06/2013   HGB 10.3* 11/06/2013   HCT 32.1* 11/06/2013   MCV 90.7 11/06/2013   PLT 158 11/06/2013     Impression: Tolerating radiation therapy well. I told she may take one half Imodium any time she has more than 3-4 bowel movements a day.  Plan: Continue radiation therapy as planned.

## 2013-11-20 NOTE — Progress Notes (Signed)
Patient states her rectum is sore from multiple BM's over the weekend. She has been applying A&D ointment. She states she began having "soft stools" Sat and had multiple stools the next day as well. She states she did not take Imodium because "they weren't watery, just soft". She has taken Colace 1 cap daily. She eats 2 meals a day including chicken, applesauce, fish, grits, toast, soups. She is drinking 1-2 nutritional supplements daily, drinking tea, water, juice. She lost .4 lb in  Past week.   She states she is urinating often but denies dysuria, other urinary issues. She is fatigued, denies dizziness. Orthostatic VS taken. Gave her sitz bath with instructions for proper use. Pt and nephew verbalized understanding.

## 2013-11-21 ENCOUNTER — Ambulatory Visit
Admission: RE | Admit: 2013-11-21 | Discharge: 2013-11-21 | Disposition: A | Discharge status: Home / Self Care | Payer: Medicare Other | Source: Ambulatory Visit | Attending: Radiation Oncology | Admitting: Radiation Oncology

## 2013-11-21 DIAGNOSIS — C20 Malignant neoplasm of rectum: Secondary | ICD-10-CM

## 2013-11-21 DIAGNOSIS — Z510 Encounter for antineoplastic radiation therapy: Secondary | ICD-10-CM | POA: Diagnosis not present

## 2013-11-21 NOTE — Progress Notes (Signed)
Complex simulation note: The patient was taken to the CT simulator and placed in her custom Vac lock. Her pelvis was rescanned. The CT data set was sent to the planning system I contoured her rectal cancer CTV. This be expanded by 1.5 cm to create PTV boost. This volume will receive 540 cGy in 3 sessions.

## 2013-11-22 ENCOUNTER — Ambulatory Visit
Admission: RE | Admit: 2013-11-22 | Discharge: 2013-11-22 | Disposition: A | Discharge status: Home / Self Care | Payer: Medicare Other | Source: Ambulatory Visit | Attending: Radiation Oncology | Admitting: Radiation Oncology

## 2013-11-22 DIAGNOSIS — Z510 Encounter for antineoplastic radiation therapy: Secondary | ICD-10-CM | POA: Diagnosis not present

## 2013-11-23 ENCOUNTER — Ambulatory Visit
Admission: RE | Admit: 2013-11-23 | Discharge: 2013-11-23 | Disposition: A | Discharge status: Home / Self Care | Payer: Medicare Other | Source: Ambulatory Visit | Attending: Radiation Oncology | Admitting: Radiation Oncology

## 2013-11-23 ENCOUNTER — Telehealth: Payer: Self-pay | Admitting: Oncology

## 2013-11-23 ENCOUNTER — Ambulatory Visit (HOSPITAL_BASED_OUTPATIENT_CLINIC_OR_DEPARTMENT_OTHER): Payer: Medicare Other | Admitting: Oncology

## 2013-11-23 ENCOUNTER — Other Ambulatory Visit (HOSPITAL_BASED_OUTPATIENT_CLINIC_OR_DEPARTMENT_OTHER): Payer: Medicare Other

## 2013-11-23 VITALS — BP 167/70 | HR 91 | Temp 97.5°F | Resp 18 | Ht 67.0 in | Wt 129.9 lb

## 2013-11-23 DIAGNOSIS — C20 Malignant neoplasm of rectum: Secondary | ICD-10-CM

## 2013-11-23 DIAGNOSIS — Z86711 Personal history of pulmonary embolism: Secondary | ICD-10-CM

## 2013-11-23 DIAGNOSIS — K7689 Other specified diseases of liver: Secondary | ICD-10-CM

## 2013-11-23 DIAGNOSIS — Z510 Encounter for antineoplastic radiation therapy: Secondary | ICD-10-CM | POA: Diagnosis not present

## 2013-11-23 LAB — CBC WITH DIFFERENTIAL/PLATELET
BASO%: 0.3 % (ref 0.0–2.0)
Basophils Absolute: 0 10*3/uL (ref 0.0–0.1)
EOS%: 2.1 % (ref 0.0–7.0)
Eosinophils Absolute: 0.1 10*3/uL (ref 0.0–0.5)
HCT: 33.4 % — ABNORMAL LOW (ref 34.8–46.6)
HGB: 11 g/dL — ABNORMAL LOW (ref 11.6–15.9)
LYMPH%: 4.3 % — ABNORMAL LOW (ref 14.0–49.7)
MCH: 30.4 pg (ref 25.1–34.0)
MCHC: 32.8 g/dL (ref 31.5–36.0)
MCV: 92.5 fL (ref 79.5–101.0)
MONO#: 0.5 10*3/uL (ref 0.1–0.9)
MONO%: 11.4 % (ref 0.0–14.0)
NEUT#: 3.7 10*3/uL (ref 1.5–6.5)
NEUT%: 81.9 % — ABNORMAL HIGH (ref 38.4–76.8)
Platelets: 152 10*3/uL (ref 145–400)
RBC: 3.62 10*6/uL — AB (ref 3.70–5.45)
RDW: 19.7 % — AB (ref 11.2–14.5)
WBC: 4.5 10*3/uL (ref 3.9–10.3)
lymph#: 0.2 10*3/uL — ABNORMAL LOW (ref 0.9–3.3)

## 2013-11-23 LAB — COMPREHENSIVE METABOLIC PANEL (CC13)
ALBUMIN: 3.2 g/dL — AB (ref 3.5–5.0)
ALK PHOS: 110 U/L (ref 40–150)
ALT: 52 U/L (ref 0–55)
AST: 32 U/L (ref 5–34)
Anion Gap: 9 mEq/L (ref 3–11)
BUN: 16.5 mg/dL (ref 7.0–26.0)
CALCIUM: 9.6 mg/dL (ref 8.4–10.4)
CO2: 28 mEq/L (ref 22–29)
Chloride: 103 mEq/L (ref 98–109)
Creatinine: 0.7 mg/dL (ref 0.6–1.1)
Glucose: 103 mg/dl (ref 70–140)
POTASSIUM: 4 meq/L (ref 3.5–5.1)
SODIUM: 140 meq/L (ref 136–145)
TOTAL PROTEIN: 5.6 g/dL — AB (ref 6.4–8.3)
Total Bilirubin: 1.01 mg/dL (ref 0.20–1.20)

## 2013-11-23 NOTE — Progress Notes (Signed)
Hematology and Oncology Follow Up Visit  Bethany [name] [id number] [birth year] 80 y.o. 11/23/2013 3:04 PM Bethany [name], MDKim, Bethany Rinks, MD   Principle Diagnosis: 80 year old woman with rectal adenocarcinoma diagnosed in August of 2015. Her staging by EUS his T3 N0. MRI of the liver showed potential hepatic metastasis.   Current therapy: Radiation therapy concomitantly with oral Xeloda at 500 mg twice a day started on 10/25/2013.  Interim History:  Bethany [name] presents today for a followup visit. She continues on radiation therapy with oral Xeloda with very little complications. She did develop worsening diarrhea for the first time today. She had multiple loose bowel movements this morning but was able to finish her radiation. She does take Colace on a daily basis. She is reporting some mild fatigue and nausea without vomiting.  She does not report any hand-foot syndrome or any skin irritation. So far she is able to have of reasonable quality of life. She has not had any headaches or blurry vision. She does not report any syncope or seizures. She does not report any chest pain, shortness of breath, cough or hemoptysis. She does not report any palpitation, orthopnea or PND. She does not report any leg edema. She denies abdominal pain, constipation or obstruction. She does not report any frequency urgency or hesitancy. She is not abort any skeletal complaints. She is able to perform his activities of daily living and lives independently with her nephew. She does not drive as of late. Rest of the review of systems unremarkable.     Medications: I have reviewed the patient's current medications.  Current Outpatient Prescriptions  Medication Sig Dispense Refill  . aspirin 81 MG tablet Take 81 mg by mouth daily.      . capecitabine (XELODA) 500 MG tablet One tablet twice a day Monday to Friday with radiation therapy to start on 9/14.  60 tablet  0  . cholecalciferol (VITAMIN D) 1000 UNITS tablet Take 2,000 Units  by mouth daily.      Marland Kitchen docusate sodium (COLACE) 100 MG capsule Take 100 mg by mouth daily.      Marland Kitchen levothyroxine (SYNTHROID, LEVOTHROID) 25 MCG tablet Take 25 mcg by mouth See admin instructions. Only take on Monday, Wednesday, Friday and Sunday.      . levothyroxine (SYNTHROID, LEVOTHROID) 50 MCG tablet Take 50 mcg by mouth daily before breakfast.      . olmesartan (BENICAR) 40 MG tablet Take 40 mg by mouth every morning.       . ondansetron (ZOFRAN) 8 MG tablet Take 1 tablet (8 mg total) by mouth every 8 (eight) hours as needed for nausea or vomiting.  20 tablet  1  . warfarin (COUMADIN) 5 MG tablet Take 5 mg by mouth daily.       No current facility-administered medications for this visit.     Allergies:  Allergies  Allergen Reactions  . Cataflam [Diclofenac] Other (See Comments)    Unknown; patient is unaware of allergy.  . Cephalosporins Shortness Of Breath  . Erythromycin Shortness Of Breath  . Keflex [Cephalexin] Shortness Of Breath  . Codeine Nausea And Vomiting  . Darvocet [Propoxyphene N-Acetaminophen] Other (See Comments)    Unknown; patient is unaware of allergy  . Naproxen Other (See Comments)    Unknown; patient is unaware of allergy  . Penicillins Other (See Comments)    Patient states that she CAN take this; unaware of allergy  . Synalgos-Dc [Aspirin-Caff-Dihydrocodeine] Other (See Comments)    Unknown; patient is unaware  of allergy  . Ultram [Tramadol] Other (See Comments)    Unknown; patient is unaware of allergy    Past Medical History, Surgical history, Social history, and Family History were reviewed and updated.   Physical Exam: Blood pressure 167/70, pulse 91, temperature 97.5 F (36.4 C), temperature source Oral, resp. rate 18, height 5\' 7"  (1.702 m), weight 129 lb 14.4 oz (58.922 kg). ECOG: 1 General appearance: alert and cooperative Head: Normocephalic, without obvious abnormality Neck: no adenopathy Lymph nodes: Cervical, supraclavicular, and  axillary nodes normal. Heart:regular rate and rhythm, S1, S2 normal, no murmur, click, rub or gallop Lung:chest clear, no wheezing, rales, normal symmetric air entry. Abdomen: soft, non-tender, without masses or organomegaly EXT:no erythema, induration, or nodules   Lab Results: Lab Results  Component Value Date   WBC 4.5 11/23/2013   HGB 11.0* 11/23/2013   HCT 33.4* 11/23/2013   MCV 92.5 11/23/2013   PLT 152 11/23/2013     Chemistry      Component Value Date/Time   NA 140 11/23/2013 1426   NA 140 01/02/2012 0000   K 4.0 11/23/2013 1426   K 3.2* 01/02/2012 0000   CL 102 01/02/2012 0000   CO2 28 11/23/2013 1426   CO2 24 01/02/2012 0000   BUN 16.5 11/23/2013 1426   BUN 20 01/02/2012 0000   CREATININE 0.7 11/23/2013 1426   CREATININE 0.82 01/02/2012 0000      Component Value Date/Time   CALCIUM 9.6 11/23/2013 1426   CALCIUM 9.4 01/02/2012 0000   ALKPHOS 110 11/23/2013 1426   ALKPHOS 93 01/01/2012 1400   AST 32 11/23/2013 1426   AST 18 01/01/2012 1400   ALT 52 11/23/2013 1426   ALT 14 01/01/2012 1400   BILITOT 1.01 11/23/2013 1426   BILITOT 0.9 01/01/2012 1400      Impression and Plan:  80 year old woman with the following issues:  1. Rectal cancer presented with hematochezia and status post colonoscopy and EUS. The care of by EUS criteria was 4 cm in length around 4-5 cm from the anal verge. The staging was T3 N0 M1 with possible liver metastasis. She is currently receiving radiation therapy with Xeloda and tolerating it well. I asked her to hold Xeloda evening dose tonight as well as on October 16 given and diarrhea. She will resume it on October 19 of her symptoms improve. Her last treatment scheduled for 12/01/2013.   2. 19 mm lesion in the right hepatic lobe. This could represent metastatic disease from her rectal cancer. My recommendation is to address this upon the completion of her radiation and Xeloda. Options would include liver directed therapy or  radiofrequency ablation as well as systemic chemotherapy.   3. History of pulmonary embolism: He appears to have a massive unprovoked pulmonary embolism but that was over 2 years ago. Her followup CT scan did not show any blood clots. She is off warfarin for the time being and currently on aspirin while she is on Xeloda  4. Nausea prophylaxis. I have prescribed Zofran for her to use as needed.   5. Followup: 12/19/2013  [facility], MD 10/15/20153:04 PM

## 2013-11-23 NOTE — Telephone Encounter (Signed)
Gave AVS & apt d/t for Nov. -amber

## 2013-11-24 ENCOUNTER — Ambulatory Visit
Admission: RE | Admit: 2013-11-24 | Discharge: 2013-11-24 | Disposition: A | Discharge status: Home / Self Care | Payer: Medicare Other | Source: Ambulatory Visit | Attending: Radiation Oncology | Admitting: Radiation Oncology

## 2013-11-24 DIAGNOSIS — Z510 Encounter for antineoplastic radiation therapy: Secondary | ICD-10-CM | POA: Diagnosis not present

## 2013-11-27 ENCOUNTER — Encounter: Payer: Self-pay | Admitting: Radiation Oncology

## 2013-11-27 ENCOUNTER — Ambulatory Visit
Admission: RE | Admit: 2013-11-27 | Discharge: 2013-11-27 | Disposition: A | Discharge status: Home / Self Care | Payer: Medicare Other | Source: Ambulatory Visit | Attending: Radiation Oncology | Admitting: Radiation Oncology

## 2013-11-27 VITALS — BP 125/66 | HR 91 | Ht 67.0 in | Wt 129.5 lb

## 2013-11-27 DIAGNOSIS — C20 Malignant neoplasm of rectum: Secondary | ICD-10-CM

## 2013-11-27 DIAGNOSIS — Z510 Encounter for antineoplastic radiation therapy: Secondary | ICD-10-CM | POA: Diagnosis not present

## 2013-11-27 NOTE — Progress Notes (Signed)
Bethany [name] has received 24 fractions to her rectal area.  She reports many episodes of diarrehea over the weekend. C/o soreness in the rectal area as a level 5-6/10.  Currently having cramping of her abdomen  Note "a tiny bit" of blood on tissue when wiping this morning.  Taking 1 Imodium daily with minimal relief.  Is on Xeloda presently.

## 2013-11-27 NOTE — Progress Notes (Signed)
Weekly Management Note:  Site: Pelvis/ Current Dose:  4320  cGy Projected Dose: 4500  cGy  Narrative: The patient is seen today for routine under treatment assessment. CBCT/MVCT images/port films were reviewed. The chart was reviewed.   This since late last week she's had worsening diarrhea. Dr. Alen Blew health her Xeloda and told her to stop Colace. She has taken only one Imodium a day. She is off her blood pressure medicine. Dr. Alen Blew resume her Xeloda this morning, she is scheduled to finish her radiation therapy this Friday. Tomorrow she begins her reduced field boost to her primary tumor and we will be moving off the small bowel.  Physical Examination:  Filed Vitals:   11/27/13 1427  BP: 125/66  Pulse: 91  .  Weight: 129 lb 8 oz (58.741 kg). No change.  Laboratory data: Lab Results  Component Value Date   WBC 4.5 11/23/2013   HGB 11.0* 11/23/2013   HCT 33.4* 11/23/2013   MCV 92.5 11/23/2013   PLT 152 11/23/2013     Impression: Tolerating radiation therapy well, except for loosening of her bowels. I told she may take up to 3 Imodium a day provided she still having loose bowel movements. We also reviewed her diet. I expect her bowels to improve sometime later next week. I will see her this Thursday and she will finish this Friday. I encouraged her to improve her fluid and salt intake.  Plan: Continue radiation therapy as planned.

## 2013-11-28 ENCOUNTER — Inpatient Hospital Stay (HOSPITAL_COMMUNITY)
Admission: EM | Admit: 2013-11-28 | Discharge: 2013-12-01 | DRG: 871 | Disposition: A | Discharge status: Skilled Nursing Facility | Payer: Medicare Other | Attending: Internal Medicine | Admitting: Internal Medicine

## 2013-11-28 ENCOUNTER — Other Ambulatory Visit: Payer: Self-pay

## 2013-11-28 ENCOUNTER — Ambulatory Visit: Payer: Medicare Other | Admitting: Nutrition

## 2013-11-28 ENCOUNTER — Ambulatory Visit
Admission: RE | Admit: 2013-11-28 | Discharge: 2013-11-28 | Disposition: A | Discharge status: Home / Self Care | Payer: Medicare Other | Source: Ambulatory Visit | Attending: Radiation Oncology | Admitting: Radiation Oncology

## 2013-11-28 ENCOUNTER — Emergency Department (HOSPITAL_COMMUNITY): Payer: Medicare Other

## 2013-11-28 ENCOUNTER — Encounter (HOSPITAL_COMMUNITY): Payer: Self-pay | Admitting: *Deleted

## 2013-11-28 ENCOUNTER — Inpatient Hospital Stay (HOSPITAL_COMMUNITY): Payer: Medicare Other

## 2013-11-28 DIAGNOSIS — Z66 Do not resuscitate: Secondary | ICD-10-CM | POA: Diagnosis present

## 2013-11-28 DIAGNOSIS — Z808 Family history of malignant neoplasm of other organs or systems: Secondary | ICD-10-CM

## 2013-11-28 DIAGNOSIS — D649 Anemia, unspecified: Secondary | ICD-10-CM | POA: Diagnosis not present

## 2013-11-28 DIAGNOSIS — R6521 Severe sepsis with septic shock: Secondary | ICD-10-CM

## 2013-11-28 DIAGNOSIS — G9341 Metabolic encephalopathy: Secondary | ICD-10-CM | POA: Diagnosis present

## 2013-11-28 DIAGNOSIS — E440 Moderate protein-calorie malnutrition: Secondary | ICD-10-CM | POA: Insufficient documentation

## 2013-11-28 DIAGNOSIS — T451X5A Adverse effect of antineoplastic and immunosuppressive drugs, initial encounter: Secondary | ICD-10-CM | POA: Diagnosis present

## 2013-11-28 DIAGNOSIS — Z7982 Long term (current) use of aspirin: Secondary | ICD-10-CM | POA: Diagnosis not present

## 2013-11-28 DIAGNOSIS — Z510 Encounter for antineoplastic radiation therapy: Secondary | ICD-10-CM | POA: Diagnosis not present

## 2013-11-28 DIAGNOSIS — I2699 Other pulmonary embolism without acute cor pulmonale: Secondary | ICD-10-CM | POA: Diagnosis not present

## 2013-11-28 DIAGNOSIS — E872 Acidosis, unspecified: Secondary | ICD-10-CM

## 2013-11-28 DIAGNOSIS — I2609 Other pulmonary embolism with acute cor pulmonale: Secondary | ICD-10-CM

## 2013-11-28 DIAGNOSIS — A419 Sepsis, unspecified organism: Secondary | ICD-10-CM | POA: Diagnosis present

## 2013-11-28 DIAGNOSIS — Z6820 Body mass index (BMI) 20.0-20.9, adult: Secondary | ICD-10-CM | POA: Diagnosis not present

## 2013-11-28 DIAGNOSIS — I4891 Unspecified atrial fibrillation: Secondary | ICD-10-CM | POA: Diagnosis present

## 2013-11-28 DIAGNOSIS — I10 Essential (primary) hypertension: Secondary | ICD-10-CM | POA: Diagnosis not present

## 2013-11-28 DIAGNOSIS — R0600 Dyspnea, unspecified: Secondary | ICD-10-CM | POA: Diagnosis present

## 2013-11-28 DIAGNOSIS — T68XXXA Hypothermia, initial encounter: Secondary | ICD-10-CM

## 2013-11-28 DIAGNOSIS — E43 Unspecified severe protein-calorie malnutrition: Secondary | ICD-10-CM | POA: Diagnosis not present

## 2013-11-28 DIAGNOSIS — K219 Gastro-esophageal reflux disease without esophagitis: Secondary | ICD-10-CM | POA: Diagnosis present

## 2013-11-28 DIAGNOSIS — R52 Pain, unspecified: Secondary | ICD-10-CM

## 2013-11-28 DIAGNOSIS — M25552 Pain in left hip: Secondary | ICD-10-CM | POA: Diagnosis present

## 2013-11-28 DIAGNOSIS — C20 Malignant neoplasm of rectum: Secondary | ICD-10-CM | POA: Diagnosis present

## 2013-11-28 DIAGNOSIS — N9489 Other specified conditions associated with female genital organs and menstrual cycle: Secondary | ICD-10-CM

## 2013-11-28 DIAGNOSIS — A0472 Enterocolitis due to Clostridium difficile, not specified as recurrent: Secondary | ICD-10-CM

## 2013-11-28 DIAGNOSIS — E039 Hypothyroidism, unspecified: Secondary | ICD-10-CM | POA: Diagnosis present

## 2013-11-28 DIAGNOSIS — J9600 Acute respiratory failure, unspecified whether with hypoxia or hypercapnia: Secondary | ICD-10-CM | POA: Diagnosis present

## 2013-11-28 DIAGNOSIS — M79605 Pain in left leg: Secondary | ICD-10-CM | POA: Diagnosis present

## 2013-11-28 DIAGNOSIS — E876 Hypokalemia: Secondary | ICD-10-CM

## 2013-11-28 DIAGNOSIS — A047 Enterocolitis due to Clostridium difficile: Secondary | ICD-10-CM | POA: Diagnosis not present

## 2013-11-28 DIAGNOSIS — K529 Noninfective gastroenteritis and colitis, unspecified: Secondary | ICD-10-CM

## 2013-11-28 DIAGNOSIS — E785 Hyperlipidemia, unspecified: Secondary | ICD-10-CM

## 2013-11-28 DIAGNOSIS — Z86711 Personal history of pulmonary embolism: Secondary | ICD-10-CM | POA: Diagnosis present

## 2013-11-28 HISTORY — DX: Severe sepsis with septic shock: R65.21

## 2013-11-28 HISTORY — DX: Hypokalemia: E87.6

## 2013-11-28 HISTORY — DX: Sepsis, unspecified organism: A41.9

## 2013-11-28 LAB — URINALYSIS, ROUTINE W REFLEX MICROSCOPIC
Bilirubin Urine: NEGATIVE
GLUCOSE, UA: NEGATIVE mg/dL
Glucose, UA: NEGATIVE mg/dL
Ketones, ur: 40 mg/dL — AB
Ketones, ur: NEGATIVE mg/dL
Leukocytes, UA: NEGATIVE
Nitrite: NEGATIVE
Nitrite: NEGATIVE
PH: 5.5 (ref 5.0–8.0)
PROTEIN: NEGATIVE mg/dL
Protein, ur: NEGATIVE mg/dL
Specific Gravity, Urine: 1.018 (ref 1.005–1.030)
Specific Gravity, Urine: 1.038 — ABNORMAL HIGH (ref 1.005–1.030)
UROBILINOGEN UA: 1 mg/dL (ref 0.0–1.0)
Urobilinogen, UA: 0.2 mg/dL (ref 0.0–1.0)
pH: 5 (ref 5.0–8.0)

## 2013-11-28 LAB — COMPREHENSIVE METABOLIC PANEL
ALK PHOS: 127 U/L — AB (ref 39–117)
ALT: 43 U/L — AB (ref 0–35)
ANION GAP: 21 — AB (ref 5–15)
AST: 36 U/L (ref 0–37)
Albumin: 2.9 g/dL — ABNORMAL LOW (ref 3.5–5.2)
BUN: 19 mg/dL (ref 6–23)
CALCIUM: 8.7 mg/dL (ref 8.4–10.5)
CO2: 20 mEq/L (ref 19–32)
Chloride: 98 mEq/L (ref 96–112)
Creatinine, Ser: 0.81 mg/dL (ref 0.50–1.10)
GFR calc non Af Amer: 65 mL/min — ABNORMAL LOW (ref >90)
GFR, EST AFRICAN AMERICAN: 76 mL/min — AB (ref >90)
GLUCOSE: 230 mg/dL — AB (ref 70–99)
POTASSIUM: 3 meq/L — AB (ref 3.7–5.3)
SODIUM: 139 meq/L (ref 137–147)
TOTAL PROTEIN: 5.5 g/dL — AB (ref 6.0–8.3)
Total Bilirubin: 1.4 mg/dL — ABNORMAL HIGH (ref 0.3–1.2)

## 2013-11-28 LAB — CBC WITH DIFFERENTIAL/PLATELET
BASOS PCT: 0 % (ref 0–1)
Basophils Absolute: 0 10*3/uL (ref 0.0–0.1)
EOS PCT: 1 % (ref 0–5)
Eosinophils Absolute: 0.1 10*3/uL (ref 0.0–0.7)
HCT: 37.9 % (ref 36.0–46.0)
Hemoglobin: 12.6 g/dL (ref 12.0–15.0)
Lymphocytes Relative: 8 % — ABNORMAL LOW (ref 12–46)
Lymphs Abs: 0.3 10*3/uL — ABNORMAL LOW (ref 0.7–4.0)
MCH: 30.5 pg (ref 26.0–34.0)
MCHC: 33.2 g/dL (ref 30.0–36.0)
MCV: 91.8 fL (ref 78.0–100.0)
Monocytes Absolute: 0.3 10*3/uL (ref 0.1–1.0)
Monocytes Relative: 6 % (ref 3–12)
NEUTROS PCT: 85 % — AB (ref 43–77)
Neutro Abs: 3.6 10*3/uL (ref 1.7–7.7)
PLATELETS: 166 10*3/uL (ref 150–400)
RBC: 4.13 MIL/uL (ref 3.87–5.11)
RDW: 18.6 % — AB (ref 11.5–15.5)
WBC: 4.2 10*3/uL (ref 4.0–10.5)

## 2013-11-28 LAB — BLOOD GAS, ARTERIAL
ACID-BASE DEFICIT: 1.7 mmol/L (ref 0.0–2.0)
Bicarbonate: 21.5 mEq/L (ref 20.0–24.0)
DRAWN BY: 232811
O2 CONTENT: 2 L/min
O2 Saturation: 100 %
PCO2 ART: 33.1 mmHg — AB (ref 35.0–45.0)
PH ART: 7.429 (ref 7.350–7.450)
Patient temperature: 99.1
TCO2: 19.5 mmol/L (ref 0–100)
pO2, Arterial: 151 mmHg — ABNORMAL HIGH (ref 80.0–100.0)

## 2013-11-28 LAB — URINE MICROSCOPIC-ADD ON

## 2013-11-28 LAB — I-STAT CG4 LACTIC ACID, ED: Lactic Acid, Venous: 5.14 mmol/L — ABNORMAL HIGH (ref 0.5–2.2)

## 2013-11-28 LAB — I-STAT TROPONIN, ED: Troponin i, poc: 0.06 ng/mL (ref 0.00–0.08)

## 2013-11-28 LAB — PROTIME-INR
INR: 1.29 (ref 0.00–1.49)
Prothrombin Time: 16.2 seconds — ABNORMAL HIGH (ref 11.6–15.2)

## 2013-11-28 LAB — APTT: aPTT: 29 seconds (ref 24–37)

## 2013-11-28 MED ORDER — HYDROMORPHONE HCL 1 MG/ML IJ SOLN: 1.0000 mg | INTRAMUSCULAR | Status: DC | PRN

## 2013-11-28 MED ORDER — IRBESARTAN 150 MG PO TABS: 150.0000 mg | ORAL_TABLET | Freq: Every day | ORAL | Status: DC

## 2013-11-28 MED ORDER — SENNA 8.6 MG PO TABS
1.0000 | ORAL_TABLET | Freq: Two times a day (BID) | ORAL | Status: DC
  Filled 2013-11-28: qty 1

## 2013-11-28 MED ORDER — DILTIAZEM HCL 25 MG/5ML IV SOLN
10.0000 mg | Freq: Once | INTRAVENOUS | Status: AC
  Administered 2013-11-28: 10 mg via INTRAVENOUS
  Filled 2013-11-28: qty 5

## 2013-11-28 MED ORDER — SODIUM CHLORIDE 0.9 % IV BOLUS (SEPSIS)
1000.0000 mL | Freq: Once | INTRAVENOUS | Status: AC
  Administered 2013-11-28: 1000 mL via INTRAVENOUS

## 2013-11-28 MED ORDER — PIPERACILLIN-TAZOBACTAM 3.375 G IVPB 30 MIN
3.3750 g | Freq: Once | INTRAVENOUS | Status: AC
  Administered 2013-11-28: 3.375 g via INTRAVENOUS
  Filled 2013-11-28: qty 50

## 2013-11-28 MED ORDER — HEPARIN BOLUS VIA INFUSION
2000.0000 [IU] | Freq: Once | INTRAVENOUS | Status: AC
  Administered 2013-11-28: 2000 [IU] via INTRAVENOUS
  Filled 2013-11-28: qty 2000

## 2013-11-28 MED ORDER — VANCOMYCIN HCL IN DEXTROSE 750-5 MG/150ML-% IV SOLN: 750.0000 mg | INTRAVENOUS | Status: DC

## 2013-11-28 MED ORDER — LEVOTHYROXINE SODIUM 25 MCG PO TABS
25.0000 ug | ORAL_TABLET | ORAL | Status: DC
  Administered 2013-11-29 – 2013-12-01 (×2): 25 ug via ORAL
  Filled 2013-11-28 (×3): qty 1

## 2013-11-28 MED ORDER — LEVOTHYROXINE SODIUM 50 MCG PO TABS
50.0000 ug | ORAL_TABLET | ORAL | Status: DC
  Administered 2013-11-30: 50 ug via ORAL
  Filled 2013-11-28: qty 1

## 2013-11-28 MED ORDER — PIPERACILLIN-TAZOBACTAM 3.375 G IVPB
3.3750 g | Freq: Three times a day (TID) | INTRAVENOUS | Status: DC
  Administered 2013-11-29 – 2013-12-01 (×7): 3.375 g via INTRAVENOUS
  Filled 2013-11-28 (×7): qty 50

## 2013-11-28 MED ORDER — HEPARIN (PORCINE) IN NACL 100-0.45 UNIT/ML-% IJ SOLN
950.0000 [IU]/h | INTRAMUSCULAR | Status: DC
  Administered 2013-11-28: 950 [IU]/h via INTRAVENOUS
  Filled 2013-11-28 (×2): qty 250

## 2013-11-28 MED ORDER — SODIUM CHLORIDE 0.9 % IV SOLN: INTRAVENOUS | Status: DC

## 2013-11-28 MED ORDER — ONDANSETRON HCL 4 MG/2ML IJ SOLN
4.0000 mg | Freq: Four times a day (QID) | INTRAMUSCULAR | Status: DC | PRN
  Administered 2013-12-01: 4 mg via INTRAVENOUS
  Filled 2013-11-28: qty 2

## 2013-11-28 MED ORDER — VANCOMYCIN HCL IN DEXTROSE 1-5 GM/200ML-% IV SOLN
1000.0000 mg | Freq: Once | INTRAVENOUS | Status: AC
  Administered 2013-11-28: 1000 mg via INTRAVENOUS
  Filled 2013-11-28: qty 200

## 2013-11-28 MED ORDER — CETYLPYRIDINIUM CHLORIDE 0.05 % MT LIQD
7.0000 mL | Freq: Two times a day (BID) | OROMUCOSAL | Status: DC
  Administered 2013-11-28 – 2013-12-01 (×5): 7 mL via OROMUCOSAL

## 2013-11-28 MED ORDER — ONDANSETRON HCL 4 MG PO TABS: 4.0000 mg | ORAL_TABLET | Freq: Four times a day (QID) | ORAL | Status: DC | PRN

## 2013-11-28 MED ORDER — IOHEXOL 350 MG/ML SOLN
100.0000 mL | Freq: Once | INTRAVENOUS | Status: AC | PRN
  Administered 2013-11-28: 100 mL via INTRAVENOUS

## 2013-11-28 NOTE — Progress Notes (Signed)
[facility] Progress Note Patient Name: Bethany [name] DOB: 18-Sep-1928 MRN: [id number]   Date of Service  11/28/2013  HPI/Events of Note  Pt with rectal ca presented with sob and found to have submassive pe/ hemodynically stable and ok sats on Nasal 02  eICU Interventions  Continue heparin for now, not a candidate for thrombolytics due to recent surgery      Intervention Category Major Interventions: Respiratory failure - evaluation and management  Christinia Gully 11/28/2013, 11:04 PM

## 2013-11-28 NOTE — ED Notes (Signed)
NRB mask applied to patient due to O2 sat 87% on 4L [place]

## 2013-11-28 NOTE — ED Provider Notes (Signed)
CSN: [id number]     Arrival date & time 11/28/13  1848 History   First MD Initiated Contact with Patient 11/28/13 1855     Chief Complaint  Patient presents with  . Altered Mental Status     (Consider location/radiation/quality/duration/timing/severity/associated sxs/prior Treatment) The history is provided by the EMS personnel. The history is limited by the condition of the patient.  Bethany [name] is a 80 y.o. female hx of HTN, rectal cancer on radiation here with AMS. She went to oncology center today and was last normal was 3 PM. She came home and was noted to be altered. She became less responsive as per son around 5 PM. Son called EMS around 6 PM. Per EMS she was initially hypotensive to the 60s, felt warm, then had increased respiratory effort. Patient was unable to me any history. Per chart review, she has been having diarrhea recently.    Level V caveat- AMS   Past Medical History  Diagnosis Date  . Migraine   . GERD (gastroesophageal reflux disease)   . Hypertension   . Hypercholesteremia   . Pulmonary embolism 01/01/2012  . Hypothyroidism   . Rectal mass   . Ovarian tumor     sees dr Lisbeth Renshaw ob-gyn yearly for evaluation  . Cancer 09/29/13    rectal adenocarcinoma  . Rectal cancer 09/29/13    invasive adeocarcinoma   Past Surgical History  Procedure Laterality Date  . Ovarian cyst surgery  yrs ago  . Cataract extraction Bilateral   . Colonoscopy N/A 09/22/2013    Procedure: COLONOSCOPY;  Surgeon: Beryle Beams, MD;  Location: [place] [facility];  Service: Endoscopy;  Laterality: N/A;  . Eus N/A 09/29/2013    Procedure: LOWER ENDOSCOPIC ULTRASOUND (EUS);  Surgeon: Beryle Beams, MD;  Location: [facility];  Service: Endoscopy;  Laterality: N/A;  . Abdominal hysterectomy      years ago, prolapsed uterus   Family History  Problem Relation Age of Onset  . Brain cancer Mother   . Heart attack Father   . Diabetes Sister   . Heart disease Sister    History  Substance Use  Topics  . Smoking status: Never Smoker   . Smokeless tobacco: Never Used  . Alcohol Use: No   OB History   Grav Para Term Preterm Abortions TAB SAB Ect Mult Living                 Review of Systems  Unable to perform ROS: Mental status change      Allergies  Cataflam; Cephalosporins; Erythromycin; Keflex; Codeine; Darvocet; Naproxen; Penicillins; Synalgos-dc; and Ultram  Home Medications   Prior to Admission medications   Medication Sig Start Date End Date Taking? Authorizing Provider  aspirin 81 MG tablet Take 81 mg by mouth daily.   Yes Historical Provider, MD  capecitabine (XELODA) 500 MG tablet One tablet twice a day Monday to Friday with radiation therapy to start on 9/14. 10/12/13  Yes Wyatt Portela, MD  cholecalciferol (VITAMIN D) 1000 UNITS tablet Take 2,000 Units by mouth daily.   Yes Historical Provider, MD  levothyroxine (SYNTHROID, LEVOTHROID) 25 MCG tablet Take 25 mcg by mouth See admin instructions. 25 mcg  on Monday, Wednesday, Friday and Sunday.   Yes Historical Provider, MD  levothyroxine (SYNTHROID, LEVOTHROID) 50 MCG tablet Take 50 mcg by mouth daily before breakfast. Tuesday Thursday and Saturday   Yes Historical Provider, MD  docusate sodium (COLACE) 100 MG capsule Take 100 mg by mouth daily.  Historical Provider, MD  olmesartan (BENICAR) 40 MG tablet Take 40 mg by mouth every morning.     Historical Provider, MD  ondansetron (ZOFRAN) 8 MG tablet Take 1 tablet (8 mg total) by mouth every 8 (eight) hours as needed for nausea or vomiting. 11/06/13   Maryanna Shape, NP   BP 129/74  Pulse 66  Temp(Src) 96.7 F (35.9 C) (Rectal)  Resp 25  Ht 5\' 7"  (1.702 m)  Wt 129 lb 6.6 oz (58.7 kg)  BMI 20.26 kg/m2  SpO2 100% Physical Exam  Nursing note and vitals reviewed. Constitutional:  Ill appearing, lethargic, difficult to arouse   HENT:  Head: Normocephalic.  MM dry   Eyes: Conjunctivae and EOM are normal. Pupils are equal, round, and reactive to light.   Neck: Normal range of motion. Neck supple.  Cardiovascular: Normal heart sounds.   Tachy, irregular   Pulmonary/Chest:  Tachypneic, diminished bilateral bases   Abdominal: Soft. Bowel sounds are normal. She exhibits no distension. There is no tenderness. There is no rebound.  Musculoskeletal: Normal range of motion.  Neurological:  Lethargic, difficult to arouse. Moving all extremities   Skin: Skin is warm and dry.  Psychiatric:  Unable     ED Course  Procedures (including critical care time)  CRITICAL CARE Performed by: Darl Householder, Promise Weldin   Total critical care time: 30 min   Critical care time was exclusive of separately billable procedures and treating other patients.  Critical care was necessary to treat or prevent imminent or life-threatening deterioration.  Critical care was time spent personally by me on the following activities: development of treatment plan with patient and/or surrogate as well as nursing, discussions with consultants, evaluation of patient's response to treatment, examination of patient, obtaining history from patient or surrogate, ordering and performing treatments and interventions, ordering and review of laboratory studies, ordering and review of radiographic studies, pulse oximetry and re-evaluation of patient's condition.   Labs Review Labs Reviewed  CBC WITH DIFFERENTIAL - Abnormal; Notable for the following:    RDW 18.6 (*)    Neutrophils Relative % 85 (*)    Lymphocytes Relative 8 (*)    Lymphs Abs 0.3 (*)    All other components within normal limits  COMPREHENSIVE METABOLIC PANEL - Abnormal; Notable for the following:    Potassium 3.0 (*)    Glucose, Bld 230 (*)    Total Protein 5.5 (*)    Albumin 2.9 (*)    ALT 43 (*)    Alkaline Phosphatase 127 (*)    Total Bilirubin 1.4 (*)    GFR calc non Af Amer 65 (*)    GFR calc Af Amer 76 (*)    Anion gap 21 (*)    All other components within normal limits  URINALYSIS, ROUTINE W REFLEX MICROSCOPIC  - Abnormal; Notable for the following:    Color, Urine AMBER (*)    APPearance CLOUDY (*)    Hgb urine dipstick TRACE (*)    Bilirubin Urine MODERATE (*)    Ketones, ur 40 (*)    Leukocytes, UA SMALL (*)    All other components within normal limits  I-STAT CG4 LACTIC ACID, ED - Abnormal; Notable for the following:    Lactic Acid, Venous 5.14 (*)    All other components within normal limits  CULTURE, BLOOD (ROUTINE X 2)  CULTURE, BLOOD (ROUTINE X 2)  URINE CULTURE  URINE MICROSCOPIC-ADD ON  PROTIME-INR  APTT  HEPARIN LEVEL (UNFRACTIONATED)  CBC  BLOOD GAS, ARTERIAL  Randolm Idol, ED    Imaging Review Ct Head Wo Contrast  11/28/2013   CLINICAL DATA:  Patient found unresponsive at home today.  EXAM: CT HEAD WITHOUT CONTRAST  CT CERVICAL SPINE WITHOUT CONTRAST  TECHNIQUE: Multidetector CT imaging of the head and cervical spine was performed following the standard protocol without intravenous contrast. Multiplanar CT image reconstructions of the cervical spine were also generated.  COMPARISON:  Head and cervical spine CT scans 03/11/2007. Head CT scan 11/11/2009.  FINDINGS: CT HEAD FINDINGS  Chronic microvascular ischemic change and mild atrophy are noted. There is no evidence of acute intracranial abnormality including hemorrhage, infarct, mass lesion, mass effect, midline shift or abnormal extra-axial fluid collection. There is no hydrocephalus or pneumocephalus. The calvarium is intact.  CT CERVICAL SPINE FINDINGS  No fracture or malalignment of the cervical spine is identified. Loss of disc space height is most notable at C5-6 and C6-7. Lung apices are clear.  IMPRESSION: No acute finding head or cervical spine.  Chronic microvascular ischemic change and mild atrophy.  Mild appearing cervical spondylosis.   Electronically Signed   By: Inge Rise M.D.   On: 11/28/2013 20:58   Ct Angio Chest Pe W/cm &/or Wo Cm  11/28/2013   CLINICAL DATA:  Shortness of breath,, history of  colorectal cancer, IVC feeding chemo and radiation, hypoxia history of pulmonary embolus  EXAM: CT ANGIOGRAPHY CHEST WITH CONTRAST  TECHNIQUE: Multidetector CT imaging of the chest was performed using the standard protocol during bolus administration of intravenous contrast. Multiplanar CT image reconstructions and MIPs were obtained to evaluate the vascular anatomy.  CONTRAST:  140mL OMNIPAQUE IOHEXOL 350 MG/ML SOLN  COMPARISON:  09/22/2013  FINDINGS: Sagittal images of the spine shows osteopenia and degenerative changes thoracic spine. Air is noted within esophagus. There is some fluid in lower esophagus probable from gastroesophageal reflux.  There is no mediastinal hematoma or adenopathy. Heart size within normal limits.  Axial image 32 pulmonary emboli are noted into segmental branches right upper lobe.  There is pulmonary embolus in right main pulmonary artery at the takeoff of right lower lobe branch. Best seen in axial made 51. Pulmonary embolus is noted in left upper lobe segmental branch axial image 40. At least 1 pulmonary embolus is noted segmental branch in left lower lobe axial image 54. Findings are consistent with bilateral PE. There is bilateral lower lobe posterior atelectasis. Right ventricle measures 3.9 cm. Left ventricle measures 3.6 cm in diameter. Right ventricle over left ventricle room ratio measures 1.08  Review of the MIP images confirms the above findings.  IMPRESSION: 1. Bilateral pulmonary emboli as described above. Positive for acute PE with CT evidence of right heart strain (RV/LV Ratio = 1.08) consistent with at least submassive (intermediate risk) PE. The presence of right heart strain has been associated with an increased risk of morbidity and mortality. Consultation with Pulmonary and Critical Care Medicine is recommended.  2. Degenerative changes thoracic spine. 3. Small amount of air is noted in upper esophagus. Fluid is noted in distal esophagus probable from gastroesophageal  reflux. 4. Bilateral lower lobe posterior atelectasis. 5. No pulmonary edema.   Electronically Signed   By: Lahoma Crocker M.D.   On: 11/28/2013 21:09   Ct Cervical Spine Wo Contrast  11/28/2013   CLINICAL DATA:  Patient found unresponsive at home today.  EXAM: CT HEAD WITHOUT CONTRAST  CT CERVICAL SPINE WITHOUT CONTRAST  TECHNIQUE: Multidetector CT imaging of the head and cervical spine was performed following the standard  protocol without intravenous contrast. Multiplanar CT image reconstructions of the cervical spine were also generated.  COMPARISON:  Head and cervical spine CT scans 03/11/2007. Head CT scan 11/11/2009.  FINDINGS: CT HEAD FINDINGS  Chronic microvascular ischemic change and mild atrophy are noted. There is no evidence of acute intracranial abnormality including hemorrhage, infarct, mass lesion, mass effect, midline shift or abnormal extra-axial fluid collection. There is no hydrocephalus or pneumocephalus. The calvarium is intact.  CT CERVICAL SPINE FINDINGS  No fracture or malalignment of the cervical spine is identified. Loss of disc space height is most notable at C5-6 and C6-7. Lung apices are clear.  IMPRESSION: No acute finding head or cervical spine.  Chronic microvascular ischemic change and mild atrophy.  Mild appearing cervical spondylosis.   Electronically Signed   By: Inge Rise M.D.   On: 11/28/2013 20:58   Ct Abdomen Pelvis W Contrast  11/28/2013   CLINICAL DATA:  Rectal cancer and abdominal pain.  EXAM: CT ABDOMEN AND PELVIS WITH CONTRAST  TECHNIQUE: Multidetector CT imaging of the abdomen and pelvis was performed using the standard protocol following bolus administration of intravenous contrast.  CONTRAST:  162mL OMNIPAQUE IOHEXOL 350 MG/ML SOLN  COMPARISON:  10/24/2013  FINDINGS: Since the prior study, the patient has developed diffuse wall thickening and edema involving the colon as well as several loops of distal small bowel. There is associated free fluid scattered in  the mesenteries and in the dependent pelvis. There is no evidence of bowel perforation or overt bowel obstruction and findings likely reflect colitis and enteritis. Component of ischemia cannot be excluded, but the major mesenteric arteries appear patent. There is no evidence of mesenteric venous or portal venous thrombosis. No focal abscess is identified.  Rectal thickening a again noted consistent with known rectal cancer. Stable partially calcified right pelvic mass. No visible enlarged lymph nodes.  The liver, gallbladder, pancreas, spleen, adrenal glands and kidneys are unremarkable. Bilateral parapelvic renal cysts are again noted without hydronephrosis.  The bladder is decompressed by a Foley catheter.  IMPRESSION: Development of diffuse wall thickening and edema involving the colon as well as distal small bowel. Findings are consistent with colitis/enteritis. No associated overt bowel obstruction or perforation.   Electronically Signed   By: Aletta Edouard M.D.   On: 11/28/2013 21:08   Dg Chest Portable 1 View  11/28/2013   CLINICAL DATA:  Altered mental status. Recent diagnosis of colon cancer.  EXAM: PORTABLE CHEST - 1 VIEW  COMPARISON:  Single view of the chest 04/25/2013. CT chest 09/22/2013.  FINDINGS: Lung volumes are lower than on the comparison plain film of the chest with crowding of the bronchovascular structures. No consolidative process, pneumothorax or effusion is identified. Heart size is normal.  IMPRESSION: No acute disease in a low volume chest.   Electronically Signed   By: Inge Rise M.D.   On: 11/28/2013 19:36     EKG Interpretation None       Date: 11/28/2013  Rate:170  Rhythm: atrial fibrillation  QRS Axis: normal  Intervals: normal  ST/T Wave abnormalities: nonspecific ST changes  Conduction Disutrbances:none  Narrative Interpretation:   Old EKG Reviewed: changes noted    MDM   Final diagnoses:  None    Bethany [name] is a 80 y.o. female here with AMS,  SOB. Rectal temp 96.7. Code sepsis initiated. She has new onset afib as well. Will try IVF and low dose cardizem. Will also do CT angio chest to r/o PE. Will get  CT ab/pel to assess cancer progression.   7:30 PM Given cardizem 10 mg IV. HR improved. Vanc/zosyn ordered empirically since lactate is 5.   9:27 PM CT showed bilateral PEs with right heart strain. BP maintaining. Heparin ordered. Has enteritis on CT, vanc/zosyn ordered. Will admit to stepdown. Critical will consult.    Wandra Arthurs, MD 11/28/13 2128

## 2013-11-28 NOTE — ED Notes (Signed)
NS 750 ml given to patient by EMS

## 2013-11-28 NOTE — Progress Notes (Signed)
Patient returns for nutrition followup.  She is receiving radiation therapy for rectal cancer.   She continues to complain of diarrhea.  Patient is restricting oral intake secondary to diarrhea.   She reports pain after drinking vanilla boost and refuses to drink anymore.   She tolerates boost breeze, but will drink no more than 2 daily.  Oral intake is inadequate.  Weight documented as 129.5 pounds October 19, down from 133.9 pounds.  Patient able to verbalize that she is not drinking dairy products such as milk or eating ice cream.  Nutrition diagnosis: Unintended weight loss continues.  Intervention: Encouraged patient to increase variety and amount of bland, soft foods to increase overall calories and protein. Recommended patient remain on low fiber diet. Encouraged foods that will thicken stool such as applesauce, bananas, white rice, pasta, and potatoes. Recommended patient try smooth peanut butter for additional calories. Provided patient with samples of [place] chicken broth for additional protein, calories, and sodium. Questions answered.  Teach back method used.  Monitoring, evaluation, goals: Patient will tolerate increased calories and protein along with fluids to minimize further weight loss and improve quality-of-life.  Next visit: Patient finishes radiation therapy on Friday.  Patient encouraged to contact me with questions or concerns.  She has my contact information.  **Disclaimer: This note was dictated with voice recognition software. Similar sounding words can inadvertently be transcribed and this note may contain transcription errors which may not have been corrected upon publication of note.**

## 2013-11-28 NOTE — H&P (Addendum)
[facility] History and Physical  NDEA KILROY XFG:[id number] DOB: 08-16-1928 DOA: 11/28/2013  Referring physician: ED physician PCP: [name], MD   Chief Complaint: dyspnea   HPI: Pt is 80 yo female with rectal adenocarcinoma diagnosed in August of 2015, ? potential hepatic metastasis per recent MRI, currently on radiation therapy concomitantly with oral Xeloda at 500 mg twice a day started on 10/25/2013. Pt presented via EMS to [facility] ED after noted AMS at home by family member. Pt is unable to provide meaningful history on admission, most of the information obtained from available records and ED doctor. Pt apparently saw her oncologist earlier in the day around 3 pm and was at her baseline mental state of functioning. She was noted to be confused around 5 pm and less responsive. Per son, pt appeared to have dyspnea but no clear symptoms noted such as fevers, chills, abd concerns. In ED, pt noted to be hypothermic with T 96.37F, initially hypotensive with BP 90/50, RR in mid 30's, HR 100 - 180 bpm. Blood work notable for lactic acid > 5, CT chest angio significant for pulmonary embolism. Ct abd c/w acute colitis, enteritis. Pt started on Heparin drip, [facility] asked to admit to SDU. PCCM consulted.  Assessment and Plan: Principal Problem:   Acute respiratory failure  - secondary to acute PE with evidence of right heart strain on CT chest angio - admit to SDU - heparin per pharmacy  - keep on oxygen   Acute encephalopathy - secondary to sepsis in combination with acute PE - admit to SDU, supportive care with IVF oxygen as needed - ABX as noted above - PCCM consulted  Active Problems:   Septic shock - ? Secondary to colitis in combination with acute PE and right heart strain  - pt meets criteria: hypothermic, hypotensive, tachycardic, RR in 30's, lactic acid > 5 - UA suggestive of potential UTI as well  - will obtain urine culture, blood culture - check lactic acid post resuscitation,  check PCT level  - place on broad spectrum ABX for now and taper down as clinically indicated  - provide IVF   Colitis/enetritis  - noted on CT abd - ABX above should be adequate for now in 80's - IVF, analgesia as needed   Atrial fibrillation with RVR - initial HR in 180's, pt started on Diltiazem drip and HR now - off cardizem drip, caution given hypotension  - 12 lead pending, cycle CE's   Rectal cancer with potential liver mets - will notify Dr. Alen Blew of pt's admission    Hypokalemia - supplement and repeat BMP in AM   Hypothyroidism - continue synthroid  - check TSH    Radiological Exams on Admission: Ct Head Wo Contrast  11/28/2013    No acute finding head or cervical spine.  Chronic microvascular ischemic change and mild atrophy.    Ct Angio Chest Pe W/cm &/or Wo Cm 11/28/2013   1. Bilateral pulmonary emboli as described above. Positive for acute PE with CT evidence of right heart strain (RV/LV Ratio = 1.08) consistent with at least submassive (intermediate risk) PE. The presence of right heart strain has been associated with an increased risk of morbidity and mortality. Consultation with Pulmonary and Critical Care Medicine is recommended.   2. Degenerative changes thoracic spine.  3. Small amount of air is noted in upper esophagus. Fluid is noted in distal esophagus probable from gastroesophageal reflux.  4. Bilateral lower lobe posterior atelectasis.   Ct Abdomen Pelvis  W Contrast  11/28/2013    Development of diffuse wall thickening and edema involving the colon as well as distal small bowel. Findings are consistent with colitis/enteritis. No associated overt bowel obstruction or perforation.    Dg Chest Portable 1 View  11/28/2013   No acute disease in a low volume chest.     Code Status: Full Family Communication: No family at bedside  Disposition Plan: Admit for further evaluation     Review of Systems:  Unable to obtain due to AMS    Past Medical History   Diagnosis Date  . Migraine   . GERD (gastroesophageal reflux disease)   . Hypertension   . Hypercholesteremia   . Pulmonary embolism 01/01/2012  . Hypothyroidism   . Rectal mass   . Ovarian tumor     sees dr [facility] ob-gyn yearly for evaluation  . Cancer 09/29/13    rectal adenocarcinoma  . Rectal cancer 09/29/13    invasive adeocarcinoma    Past Surgical History  Procedure Laterality Date  . Ovarian cyst surgery  yrs ago  . Cataract extraction Bilateral   . Colonoscopy N/A 09/22/2013    Procedure: COLONOSCOPY;  Surgeon: Beryle Beams, MD;  Location: [place];  Service: Endoscopy;  Laterality: N/A;  . Eus N/A 09/29/2013    Procedure: LOWER ENDOSCOPIC ULTRASOUND (EUS);  Surgeon: Beryle Beams, MD;  Location: [facility];  Service: Endoscopy;  Laterality: N/A;  . Abdominal hysterectomy      years ago, prolapsed uterus    Social History:  reports that she has never smoked. She has never used smokeless tobacco. She reports that she does not drink alcohol or use illicit drugs.  Allergies  Allergen Reactions  . Cataflam [Diclofenac] Other (See Comments)    Unknown; patient is unaware of allergy.  . Cephalosporins Shortness Of Breath  . Erythromycin Shortness Of Breath  . Keflex [Cephalexin] Shortness Of Breath  . Codeine Nausea And Vomiting  . Darvocet [Propoxyphene N-Acetaminophen] Other (See Comments)    Unknown; patient is unaware of allergy  . Naproxen Other (See Comments)    Unknown; patient is unaware of allergy  . Penicillins Other (See Comments)    Patient states that she CAN take this; unaware of allergy  . Synalgos-Dc [Aspirin-Caff-Dihydrocodeine] Other (See Comments)    Unknown; patient is unaware of allergy  . Ultram [Tramadol] Other (See Comments)    Unknown; patient is unaware of allergy    Family History  Problem Relation Age of Onset  . Brain cancer Mother   . Heart attack Father   . Diabetes Sister   . Heart disease Sister     Medication Sig   aspirin 81 MG tablet Take 81 mg by mouth daily.  capecitabine 500 MG tablet One tablet twice a day Monday to Friday with radiation therapy to start on 9/14.  levothyroxine  25 - 50 MCG tablet Take 25 mcg by mouth   olmesartan (BENICAR) 40 MG tablet Take 40 mg by mouth every morning.     Physical Exam: Filed Vitals:   11/28/13 2015 11/28/13 2100 11/28/13 2107 11/28/13 2156  BP: 100/47 129/74    Pulse: 51 66    Temp:    99.1 F (37.3 C)  TempSrc:    Core (Comment)  Resp: 13 25  24   Height:   5\' 7"  (1.702 m)   Weight:   58.7 kg (129 lb 6.6 oz)   SpO2: 100% 100%  100%    Physical Exam  Constitutional:  Appears somnolent but easy to arouse, NAD HENT: Normocephalic. External right and left ear normal. Dry MM Eyes: Conjunctivae and EOM are normal. PERRLA, no scleral icterus.  Neck: Normal ROM. Neck supple. No JVD. No tracheal deviation. No thyromegaly.  CVS: RRR, S1/S2 +, no gallops, no carotid bruit.  Pulmonary: Poor inspiratory effort, diminished breath sounds at bases   Abdominal: Soft. BS +,  no distension, tenderness, rebound or guarding.  Musculoskeletal: No edema and no tenderness.  Lymphadenopathy: No lymphadenopathy noted, cervical, inguinal. Neuro: Somnolent but can be aroused Skin: Skin is warm and dry. No rash noted.  Psychiatric: unable to obtain due to Gap on Admission:  Basic Metabolic Panel:  Recent Labs Lab 11/23/13 1426 11/28/13 1912  NA 140 139  K 4.0 3.0*  CL  --  98  CO2 28 20  GLUCOSE 103 230*  BUN 16.5 19  CREATININE 0.7 0.81  CALCIUM 9.6 8.7   Liver Function Tests:  Recent Labs Lab 11/23/13 1426 11/28/13 1912  AST 32 36  ALT 52 43*  ALKPHOS 110 127*  BILITOT 1.01 1.4*  PROT 5.6* 5.5*  ALBUMIN 3.2* 2.9*   CBC:  Recent Labs Lab 11/23/13 1425 11/28/13 1912  WBC 4.5 4.2  NEUTROABS 3.7 3.6  HGB 11.0* 12.6  HCT 33.4* 37.9  MCV 92.5 91.8  PLT 152 166   EKG: Normal sinus rhythm, no ST/T wave changes  Faye Ramsay,  MD  [facility] Pager [phone]  If 7PM-7AM, please contact night-coverage [email or web address] Password [email or web address] 11/28/2013, 10:07 PM

## 2013-11-28 NOTE — ED Notes (Signed)
Bed: RESA Expected date:  Expected time:  Means of arrival:  Comments: ems- colon cancer, hypotensive, lethargic

## 2013-11-28 NOTE — ED Notes (Signed)
Patient arrives to ED via [facility] from home (son takes care of her) Patient seen at [facility] today Patient here with c/o AMS, fever, elevated respiratory rate and effort Patient's son found her at 5 pm, propped against the wall with AMS and unresponsive Patient's son called EMS at 6 pm

## 2013-11-28 NOTE — ED Notes (Signed)
Patient not responding to Neuro assessment questions Patient will state "I am cold", but is unable to answer as to what her name is, what brings her to ED, ect. Bear hugger applied to patient

## 2013-11-28 NOTE — Progress Notes (Signed)
ANTICOAGULATION CONSULT NOTE - Initial Consult  Pharmacy Consult for Heparin Indication: pulmonary embolus  Allergies  Allergen Reactions  . Cataflam [Diclofenac] Other (See Comments)    Unknown; patient is unaware of allergy.  . Cephalosporins Shortness Of Breath  . Erythromycin Shortness Of Breath  . Keflex [Cephalexin] Shortness Of Breath  . Codeine Nausea And Vomiting  . Darvocet [Propoxyphene N-Acetaminophen] Other (See Comments)    Unknown; patient is unaware of allergy  . Naproxen Other (See Comments)    Unknown; patient is unaware of allergy  . Penicillins Other (See Comments)    Patient states that she CAN take this; unaware of allergy  . Synalgos-Dc [Aspirin-Caff-Dihydrocodeine] Other (See Comments)    Unknown; patient is unaware of allergy  . Ultram [Tramadol] Other (See Comments)    Unknown; patient is unaware of allergy    Patient Measurements: Height: 5\' 7"  (170.2 cm) Weight: 129 lb 6.6 oz (58.7 kg) IBW/kg (Calculated) : 61.6 Heparin Dosing Weight: 58.7 kg  Vital Signs: Temp: 96.7 F (35.9 C) (10/20 1856) Temp Source: Rectal (10/20 1856) BP: 129/74 mmHg (10/20 2100) Pulse Rate: 66 (10/20 2100)  Labs:  Recent Labs  11/28/13 1912  HGB 12.6  HCT 37.9  PLT 166  CREATININE 0.81    Estimated Creatinine Clearance: 48.8 ml/min (by C-G formula based on Cr of 0.81).   Medical History: Past Medical History  Diagnosis Date  . Migraine   . GERD (gastroesophageal reflux disease)   . Hypertension   . Hypercholesteremia   . Pulmonary embolism 01/01/2012  . Hypothyroidism   . Rectal mass   . Ovarian tumor     sees dr Lisbeth Renshaw ob-gyn yearly for evaluation  . Cancer 09/29/13    rectal adenocarcinoma  . Rectal cancer 09/29/13    invasive adeocarcinoma    Medications:  Scheduled:   Infusions:  . heparin     Followed by  . heparin    . piperacillin-tazobactam 3.375 g (11/28/13 2109)  . vancomycin      Assessment:  80 yr female with h/o colon cancer  brought to ED with symptoms of altered mental status and unresponsive state.  On Xeloda and undergoing radiation therapy.  Patient found to be in new onset AFib  CT Angio ordered  Pharmacy consulted to begin IV therapy for treatment of Pulmonary Embolism  Goal of Therapy:  Heparin level 0.3-0.7 units/ml Monitor platelets by anticoagulation protocol: Yes   Plan:   Obtain baseline PTT and INR stat  Heparin 2000 unit IV bolus x 1 followed by infusion @ 950 units/hr  Check heparin level 8 hr after heparin started  Check heparin level and CBC daily while on heparin  Lynise Porr, Toribio Harbour, PharmD 11/28/2013,9:13 PM

## 2013-11-28 NOTE — ED Notes (Signed)
Pt switched to [place] @ 4L

## 2013-11-28 NOTE — Progress Notes (Signed)
ANTIBIOTIC CONSULT NOTE - INITIAL  Pharmacy Consult for Vancomycin and Zosyn  Indication: Sepsis  Allergies  Allergen Reactions  . Cataflam [Diclofenac] Other (See Comments)    Unknown; patient is unaware of allergy.  . Cephalosporins Shortness Of Breath  . Erythromycin Shortness Of Breath  . Keflex [Cephalexin] Shortness Of Breath  . Codeine Nausea And Vomiting  . Darvocet [Propoxyphene N-Acetaminophen] Other (See Comments)    Unknown; patient is unaware of allergy  . Naproxen Other (See Comments)    Unknown; patient is unaware of allergy  . Penicillins Other (See Comments)    Patient states that she CAN take this; unaware of allergy  . Synalgos-Dc [Aspirin-Caff-Dihydrocodeine] Other (See Comments)    Unknown; patient is unaware of allergy  . Ultram [Tramadol] Other (See Comments)    Unknown; patient is unaware of allergy    Patient Measurements: Height: 5\' 7"  (170.2 cm) Weight: 129 lb 6.6 oz (58.7 kg) IBW/kg (Calculated) : 61.6 Adjusted Body Weight:   Vital Signs: Temp: 99.1 F (37.3 C) (10/20 2214) Temp Source: Core (Comment) (10/20 2214) BP: 128/89 mmHg (10/20 2230) Pulse Rate: 67 (10/20 2230) Intake/Output from previous day:   Intake/Output from this shift: Total I/O In: -  Out: 300 [Urine:300]  Labs:  Recent Labs  11/28/13 1912  WBC 4.2  HGB 12.6  PLT 166  CREATININE 0.81   Estimated Creatinine Clearance: 48.8 ml/min (by C-G formula based on Cr of 0.81). No results found for this basename: VANCOTROUGH, VANCOPEAK, VANCORANDOM, GENTTROUGH, GENTPEAK, GENTRANDOM, TOBRATROUGH, TOBRAPEAK, TOBRARND, AMIKACINPEAK, AMIKACINTROU, AMIKACIN,  in the last 72 hours   Microbiology: No results found for this or any previous visit (from the past 720 hour(s)).  Medical History: Past Medical History  Diagnosis Date  . Migraine   . GERD (gastroesophageal reflux disease)   . Hypertension   . Hypercholesteremia   . Pulmonary embolism 01/01/2012  . Hypothyroidism    . Rectal mass   . Ovarian tumor     sees dr Lisbeth Renshaw ob-gyn yearly for evaluation  . Cancer 09/29/13    rectal adenocarcinoma  . Rectal cancer 09/29/13    invasive adeocarcinoma    Medications:  Anti-infectives   Start     Dose/Rate Route Frequency Ordered Stop   11/29/13 2200  vancomycin (VANCOCIN) IVPB 750 mg/150 ml premix     750 mg 150 mL/hr over 60 Minutes Intravenous Every 24 hours 11/28/13 2303     11/29/13 0600  piperacillin-tazobactam (ZOSYN) IVPB 3.375 g     3.375 g 12.5 mL/hr over 240 Minutes Intravenous 3 times per day 11/28/13 2302     11/28/13 2015  vancomycin (VANCOCIN) IVPB 1000 mg/200 mL premix     1,000 mg 200 mL/hr over 60 Minutes Intravenous  Once 11/28/13 2009 11/28/13 2254   11/28/13 2015  piperacillin-tazobactam (ZOSYN) IVPB 3.375 g     3.375 g 100 mL/hr over 30 Minutes Intravenous  Once 11/28/13 2009 11/28/13 2153     Assessment: Patient with sepsis and reduced renal function.  First dose of antibiotics already given.  Goal of Therapy:  Vancomycin trough level 15-20 mcg/ml Zosyn based on renal function   Plan:  Measure antibiotic drug levels at steady state Follow up culture results Vancomycin 750mg  iv q24hr Zosyn 3.375g IV Q8H infused over 4hrs.   Nani Skillern Crowford 11/28/2013,11:03 PM

## 2013-11-29 ENCOUNTER — Ambulatory Visit: Payer: Medicare Other

## 2013-11-29 ENCOUNTER — Encounter: Payer: Self-pay | Admitting: *Deleted

## 2013-11-29 DIAGNOSIS — K529 Noninfective gastroenteritis and colitis, unspecified: Secondary | ICD-10-CM

## 2013-11-29 DIAGNOSIS — E876 Hypokalemia: Secondary | ICD-10-CM

## 2013-11-29 DIAGNOSIS — C20 Malignant neoplasm of rectum: Secondary | ICD-10-CM

## 2013-11-29 DIAGNOSIS — E440 Moderate protein-calorie malnutrition: Secondary | ICD-10-CM | POA: Insufficient documentation

## 2013-11-29 DIAGNOSIS — E872 Acidosis: Secondary | ICD-10-CM

## 2013-11-29 DIAGNOSIS — A419 Sepsis, unspecified organism: Principal | ICD-10-CM

## 2013-11-29 DIAGNOSIS — T68XXXA Hypothermia, initial encounter: Secondary | ICD-10-CM

## 2013-11-29 DIAGNOSIS — E039 Hypothyroidism, unspecified: Secondary | ICD-10-CM

## 2013-11-29 LAB — COMPREHENSIVE METABOLIC PANEL
ALT: 36 U/L — ABNORMAL HIGH (ref 0–35)
ANION GAP: 15 (ref 5–15)
AST: 31 U/L (ref 0–37)
Albumin: 2.3 g/dL — ABNORMAL LOW (ref 3.5–5.2)
Alkaline Phosphatase: 100 U/L (ref 39–117)
BILIRUBIN TOTAL: 1.4 mg/dL — AB (ref 0.3–1.2)
BUN: 17 mg/dL (ref 6–23)
CO2: 20 meq/L (ref 19–32)
CREATININE: 0.74 mg/dL (ref 0.50–1.10)
Calcium: 7.6 mg/dL — ABNORMAL LOW (ref 8.4–10.5)
Chloride: 104 mEq/L (ref 96–112)
GFR calc Af Amer: 89 mL/min — ABNORMAL LOW (ref >90)
GFR, EST NON AFRICAN AMERICAN: 77 mL/min — AB (ref >90)
Glucose, Bld: 145 mg/dL — ABNORMAL HIGH (ref 70–99)
Potassium: 2.7 mEq/L — CL (ref 3.7–5.3)
Sodium: 139 mEq/L (ref 137–147)
Total Protein: 4.5 g/dL — ABNORMAL LOW (ref 6.0–8.3)

## 2013-11-29 LAB — CBC WITH DIFFERENTIAL/PLATELET
Basophils Absolute: 0 10*3/uL (ref 0.0–0.1)
Basophils Relative: 0 % (ref 0–1)
Eosinophils Absolute: 0 10*3/uL (ref 0.0–0.7)
Eosinophils Relative: 0 % (ref 0–5)
HEMATOCRIT: 36.4 % (ref 36.0–46.0)
HEMOGLOBIN: 12.1 g/dL (ref 12.0–15.0)
LYMPHS ABS: 0.2 10*3/uL — AB (ref 0.7–4.0)
Lymphocytes Relative: 3 % — ABNORMAL LOW (ref 12–46)
MCH: 30.6 pg (ref 26.0–34.0)
MCHC: 33.2 g/dL (ref 30.0–36.0)
MCV: 92.2 fL (ref 78.0–100.0)
MONO ABS: 0.6 10*3/uL (ref 0.1–1.0)
Monocytes Relative: 9 % (ref 3–12)
Neutro Abs: 5.2 10*3/uL (ref 1.7–7.7)
Neutrophils Relative %: 88 % — ABNORMAL HIGH (ref 43–77)
Platelets: 133 10*3/uL — ABNORMAL LOW (ref 150–400)
RBC: 3.95 MIL/uL (ref 3.87–5.11)
RDW: 18.4 % — ABNORMAL HIGH (ref 11.5–15.5)
WBC: 5.9 10*3/uL (ref 4.0–10.5)

## 2013-11-29 LAB — BASIC METABOLIC PANEL
Anion gap: 13 (ref 5–15)
BUN: 17 mg/dL (ref 6–23)
CALCIUM: 7.4 mg/dL — AB (ref 8.4–10.5)
CO2: 22 meq/L (ref 19–32)
CREATININE: 0.77 mg/dL (ref 0.50–1.10)
Chloride: 103 mEq/L (ref 96–112)
GFR calc Af Amer: 88 mL/min — ABNORMAL LOW (ref >90)
GFR calc non Af Amer: 76 mL/min — ABNORMAL LOW (ref >90)
GLUCOSE: 111 mg/dL — AB (ref 70–99)
Potassium: 3.4 mEq/L — ABNORMAL LOW (ref 3.7–5.3)
Sodium: 138 mEq/L (ref 137–147)

## 2013-11-29 LAB — CBC
HEMATOCRIT: 30.2 % — AB (ref 36.0–46.0)
Hemoglobin: 10.2 g/dL — ABNORMAL LOW (ref 12.0–15.0)
MCH: 30.4 pg (ref 26.0–34.0)
MCHC: 33.8 g/dL (ref 30.0–36.0)
MCV: 90.1 fL (ref 78.0–100.0)
Platelets: 140 10*3/uL — ABNORMAL LOW (ref 150–400)
RBC: 3.35 MIL/uL — ABNORMAL LOW (ref 3.87–5.11)
RDW: 18.6 % — ABNORMAL HIGH (ref 11.5–15.5)
WBC: 8 10*3/uL (ref 4.0–10.5)

## 2013-11-29 LAB — TROPONIN I
Troponin I: <0.3 ng/mL (ref <0.30)
Troponin I: <0.3 ng/mL (ref <0.30)

## 2013-11-29 LAB — HEPARIN LEVEL (UNFRACTIONATED)
Heparin Unfractionated: 0.35 IU/mL (ref 0.30–0.70)
Heparin Unfractionated: 0.43 IU/mL (ref 0.30–0.70)

## 2013-11-29 LAB — PROCALCITONIN
Procalcitonin: 2.44 ng/mL
Procalcitonin: 6.17 ng/mL

## 2013-11-29 LAB — LACTIC ACID, PLASMA: LACTIC ACID, VENOUS: 2.4 mmol/L — AB (ref 0.5–2.2)

## 2013-11-29 LAB — MRSA PCR SCREENING: MRSA BY PCR: NEGATIVE

## 2013-11-29 LAB — TSH: TSH: 1.7 u[IU]/mL (ref 0.350–4.500)

## 2013-11-29 MED ORDER — VANCOMYCIN HCL 500 MG IV SOLR
500.0000 mg | Freq: Two times a day (BID) | INTRAVENOUS | Status: DC
  Administered 2013-11-29 – 2013-12-01 (×4): 500 mg via INTRAVENOUS
  Filled 2013-11-29 (×5): qty 500

## 2013-11-29 MED ORDER — HYDROCODONE-ACETAMINOPHEN 5-325 MG PO TABS
1.0000 | ORAL_TABLET | Freq: Four times a day (QID) | ORAL | Status: DC | PRN
  Administered 2013-11-29 – 2013-12-01 (×5): 1 via ORAL
  Filled 2013-11-29 (×5): qty 1

## 2013-11-29 MED ORDER — POTASSIUM CHLORIDE IN NACL 40-0.9 MEQ/L-% IV SOLN
INTRAVENOUS | Status: DC
Start: 2013-11-29 — End: 2013-11-30
  Administered 2013-11-29 – 2013-11-30 (×3): 75 mL/h via INTRAVENOUS
  Filled 2013-11-29 (×3): qty 1000

## 2013-11-29 MED ORDER — ACETAMINOPHEN 325 MG PO TABS
650.0000 mg | ORAL_TABLET | Freq: Four times a day (QID) | ORAL | Status: DC | PRN
  Administered 2013-11-29: 650 mg via ORAL
  Filled 2013-11-29: qty 2

## 2013-11-29 MED ORDER — HEPARIN (PORCINE) IN NACL 100-0.45 UNIT/ML-% IJ SOLN
950.0000 [IU]/h | INTRAMUSCULAR | Status: AC
  Administered 2013-11-29: 950 [IU]/h via INTRAVENOUS
  Filled 2013-11-29 (×2): qty 250

## 2013-11-29 MED ORDER — POTASSIUM CHLORIDE CRYS ER 20 MEQ PO TBCR
40.0000 meq | EXTENDED_RELEASE_TABLET | ORAL | Status: AC
  Administered 2013-11-29 (×2): 40 meq via ORAL
  Filled 2013-11-29 (×2): qty 2

## 2013-11-29 MED ORDER — BOOST / RESOURCE BREEZE PO LIQD
1.0000 | Freq: Two times a day (BID) | ORAL | Status: DC
  Administered 2013-11-29: 1 via ORAL
  Administered 2013-11-30: 15:00:00 via ORAL
  Administered 2013-12-01: 1 via ORAL

## 2013-11-29 NOTE — Progress Notes (Signed)
ANTICOAGULATION CONSULT NOTE - Follow Up Consult  Pharmacy Consult for Heparin Indication: pulmonary embolus  Allergies  Allergen Reactions  . Cataflam [Diclofenac] Other (See Comments)    Unknown; patient is unaware of allergy.  . Cephalosporins Shortness Of Breath  . Erythromycin Shortness Of Breath  . Keflex [Cephalexin] Shortness Of Breath  . Codeine Nausea And Vomiting  . Darvocet [Propoxyphene N-Acetaminophen] Other (See Comments)    Unknown; patient is unaware of allergy  . Naproxen Other (See Comments)    Unknown; patient is unaware of allergy  . Penicillins Other (See Comments)    Patient states that she CAN take this; unaware of allergy  . Synalgos-Dc [Aspirin-Caff-Dihydrocodeine] Other (See Comments)    Unknown; patient is unaware of allergy  . Ultram [Tramadol] Other (See Comments)    Unknown; patient is unaware of allergy    Patient Measurements: Height: 5\' 7"  (170.2 cm) Weight: 132 lb 15 oz (60.3 kg) IBW/kg (Calculated) : 61.6 Heparin dosing weight: using actual body weight of 60 kg  Vital Signs: Temp: 97.9 F (36.6 C) (10/21 0800) Temp Source: Oral (10/21 0800) BP: 101/45 mmHg (10/21 1200) Pulse Rate: 67 (10/21 1200)  Labs:  Recent Labs  11/28/13 1912 11/28/13 2239 11/29/13 0520 11/29/13 0630 11/29/13 1015 11/29/13 1349  HGB 12.6 12.1 10.2*  --   --   --   HCT 37.9 36.4 30.2*  --   --   --   PLT 166 133* 140*  --   --   --   APTT 29  --   --   --   --   --   LABPROT 16.2*  --   --   --   --   --   INR 1.29  --   --   --   --   --   HEPARINUNFRC  --   --   --  0.35  --  0.43  CREATININE 0.81 0.74 0.77  --   --   --   TROPONINI  --  <0.30 <0.30  --  <0.30  --     Estimated Creatinine Clearance: 50.7 ml/min (by C-G formula based on Cr of 0.77).  Bethany [name]: 45 yoF with rectal adenocarcinoma diagnosed in August of 2015, ? potential hepatic metastasis per recent MRI, currently on radiation therapy concomitantly with oral Xeloda at 500 mg twice a  day started on 10/25/2013 presents with AMS and acute respiratory failure.  CTA chest showed bilateral pulmonary emboli, evidence of right heart strain consistent with at least a submassive PE.  Patient has history of pulmonary embolism in 2013 was taken off her Coumadin in 2014 do to interactions with chemotherapy.  Pharmacy consulted to manage heparin infusion.  Today, 10/21:  Repeat heparin level remained therapeutic at 0.43 with infusion at 950 units/hr.  CBC: Hgb 10.2, platelets 140K, no bleeding noted.  Per MD, plan is to transition to Xarelto.  Patient's oncologist, Dr. Alen Blew, agreed with starting patient on Xarelto.  Goal of Therapy:  Heparin level 0.3-0.7 units/ml Monitor platelets by anticoagulation protocol: Yes   Plan:  1.  Continue heparin infusion at 950 units/hr. 2.  Check HL in AM.  Daily CBC while on heparin. 3.  Pharmacy will f/u for MD's recommendations on when to switch to Xarelto.  Bethany [name] 11/29/2013,3:00 PM

## 2013-11-29 NOTE — Progress Notes (Signed)
INITIAL NUTRITION ASSESSMENT  DOCUMENTATION CODES Per approved criteria  -Severe malnutrition in the context of chronic illness  Pt meets criteria for severe MALNUTRITION in the context of chronic illness as evidenced by PO intake < 75% for > one month, 20% body weight loss in < one year.   INTERVENTION: -Recommend Resource Breeze po BID, each supplement provides 250 kcal and 9 grams of protein -Encouraged compliance of low fiber diet w/intake of binding fiber foods to assist with loose stools -RD to continue to montior   NUTRITION DIAGNOSIS: Inadequate oral intake related to decreased appetite/loose stools as evidenced by PO intake < 75%, wt loss  Goal: Pt to meet >/= 90% of their estimated nutrition needs    Monitor:  Total protein/energy intake, labs, weights, GI profile  Reason for Assessment: MST  80 y.o. female  Admitting Dx: Pulmonary embolism  ASSESSMENT: Patient is 80 yo female with rectal adenocarcinoma diagnosed in August of 2015, ? potential hepatic metastasis per recent MRI, currently on radiation therapy concomitantly with oral Xeloda at 500 mg twice a day started on 10/25/2013. Patient presented via EMS to [facility] ED after noted to have altered mental status at home by family member.  -Pt being followed by outpatient oncology RD since 10/2013. Pt reported decreased intake d/t loose stools, was restricting intake to decrease frequency of episodes. Had limited dairy products, and stopped drinking Ensure or Boost as they exacerbated symptoms.  -Can tolerate Resource Breeze, drinks two or less daily -Has been educated on low fiber diet and provided with nutrition supplement samples -Pt reported being unable to tolerate Unjury sample that was provided last visit; noted that chicken broth caused her to break out in red splotchy marks all over -Endorsed continued weight loss since 04/2012, noted usual body weight around 165 lbs (20% body weight loss, severe for time  frame) -Assisted pt in ordering breakfast meal, encouraged intake of fiber binding foods, however pt continues to be very apprehensive towards PO intake -MD noted pt with possible enteritis/colitis  Height: Ht Readings from Last 1 Encounters:  11/28/13 5\' 7"  (1.702 m)    Weight: Wt Readings from Last 1 Encounters:  11/28/13 132 lb 15 oz (60.3 kg)    Ideal Body Weight: 135 lb  % Ideal Body Weight: 102%  Wt Readings from Last 10 Encounters:  11/28/13 132 lb 15 oz (60.3 kg)  11/27/13 129 lb 8 oz (58.741 kg)  11/23/13 129 lb 14.4 oz (58.922 kg)  11/20/13 133 lb (60.328 kg)  11/13/13 133 lb 14.4 oz (60.737 kg)  11/09/13 134 lb 4.8 oz (60.918 kg)  11/08/13 134 lb (60.782 kg)  11/07/13 133 lb 14.4 oz (60.737 kg)  11/06/13 134 lb 6.4 oz (60.963 kg)  10/30/13 133 lb 12.8 oz (60.691 kg)    Usual Body Weight: 165 lb in 04/2012  % Usual Body Weight: 80%  BMI:  Body mass index is 20.82 kg/(m^2).  Estimated Nutritional Needs: Kcal: 1700-1900 Protein: 70-80 gram Fluid: >/= 1700 ml daily  Skin: WDL  Diet Order: General  EDUCATION NEEDS: -Education needs addressed   Intake/Output Summary (Last 24 hours) at 11/29/13 1522 Last data filed at 11/29/13 1300  Gross per 24 hour  Intake 2279.26 ml  Output    710 ml  Net 1569.26 ml    Last BM: 10/20   Labs:   Recent Labs Lab 11/28/13 1912 11/28/13 2239 11/29/13 0520  NA 139 139 138  K 3.0* 2.7* 3.4*  CL 98 104 103  CO2  20 20 22   BUN 19 17 17   CREATININE 0.81 0.74 0.77  CALCIUM 8.7 7.6* 7.4*  GLUCOSE 230* 145* 111*    CBG (last 3)  No results found for this basename: GLUCAP,  in the last 72 hours  Scheduled Meds: . antiseptic oral rinse  7 mL Mouth Rinse BID  . feeding supplement (RESOURCE BREEZE)  1 Container Oral BID BM  . levothyroxine  25 mcg Oral Once per day on Sun Mon Wed Fri  . [START ON 11/30/2013] levothyroxine  50 mcg Oral Once per day on Tue Thu Sat  . piperacillin-tazobactam (ZOSYN)  IV  3.375 g  Intravenous 3 times per day  . senna  1 tablet Oral BID  . vancomycin  500 mg Intravenous Q12H    Continuous Infusions: . 0.9 % NaCl with KCl 40 mEq / L 75 mL/hr (11/29/13 1450)  . heparin 950 Units/hr (11/29/13 0900)    Past Medical History  Diagnosis Date  . Migraine   . GERD (gastroesophageal reflux disease)   . Hypertension   . Hypercholesteremia   . Pulmonary embolism 01/01/2012  . Hypothyroidism   . Rectal mass   . Ovarian tumor     sees dr Lisbeth Renshaw ob-gyn yearly for evaluation  . Cancer 09/29/13    rectal adenocarcinoma  . Rectal cancer 09/29/13    invasive adeocarcinoma    Past Surgical History  Procedure Laterality Date  . Ovarian cyst surgery  yrs ago  . Cataract extraction Bilateral   . Colonoscopy N/A 09/22/2013    Procedure: COLONOSCOPY;  Surgeon: Beryle Beams, MD;  Location: [facility];  Service: Endoscopy;  Laterality: N/A;  . Eus N/A 09/29/2013    Procedure: LOWER ENDOSCOPIC ULTRASOUND (EUS);  Surgeon: Beryle Beams, MD;  Location: [facility];  Service: Endoscopy;  Laterality: N/A;  . Abdominal hysterectomy      years ago, prolapsed uterus    Atlee Abide [place] LDN Clinical Dietitian [phone]

## 2013-11-29 NOTE — Progress Notes (Signed)
ANTICOAGULATION CONSULT NOTE - Follow Up Consult  Pharmacy Consult for Heparin Indication: pulmonary embolus  Allergies  Allergen Reactions  . Cataflam [Diclofenac] Other (See Comments)    Unknown; patient is unaware of allergy.  . Cephalosporins Shortness Of Breath  . Erythromycin Shortness Of Breath  . Keflex [Cephalexin] Shortness Of Breath  . Codeine Nausea And Vomiting  . Darvocet [Propoxyphene N-Acetaminophen] Other (See Comments)    Unknown; patient is unaware of allergy  . Naproxen Other (See Comments)    Unknown; patient is unaware of allergy  . Penicillins Other (See Comments)    Patient states that she CAN take this; unaware of allergy  . Synalgos-Dc [Aspirin-Caff-Dihydrocodeine] Other (See Comments)    Unknown; patient is unaware of allergy  . Ultram [Tramadol] Other (See Comments)    Unknown; patient is unaware of allergy    Patient Measurements: Height: 5\' 7"  (170.2 cm) Weight: 132 lb 15 oz (60.3 kg) IBW/kg (Calculated) : 61.6 Heparin Dosing Weight:   Vital Signs: Temp: 97.8 F (36.6 C) (10/21 0400) Temp Source: Oral (10/21 0400) BP: 98/44 mmHg (10/21 0600) Pulse Rate: 75 (10/21 0600)  Labs:  Recent Labs  11/28/13 1912 11/28/13 2239 11/29/13 0520 11/29/13 0630  HGB 12.6 12.1 10.2*  --   HCT 37.9 36.4 30.2*  --   PLT 166 133* 140*  --   APTT 29  --   --   --   LABPROT 16.2*  --   --   --   INR 1.29  --   --   --   HEPARINUNFRC  --   --   --  0.35  CREATININE 0.81 0.74 0.77  --   TROPONINI  --  <0.30 <0.30  --     Estimated Creatinine Clearance: 50.7 ml/min (by C-G formula based on Cr of 0.77).   Medications:  Infusions:  . 0.9 % NaCl with KCl 40 mEq / L 75 mL/hr (11/29/13 0028)  . heparin      Assessment: Patient with heparin level at goal.  No issues per RN.  Goal of Therapy:  Heparin level 0.3-0.7 units/ml Monitor platelets by anticoagulation protocol: Yes   Plan:  Continue heparin drip at current rate Recheck level at  Brady, Ozora Crowford 11/29/2013,7:13 AM

## 2013-11-29 NOTE — Progress Notes (Signed)
CARE MANAGEMENT NOTE 11/29/2013  Patient:  Bethany [name], Bethany [name]   Account Number:  [id number]  Date Initiated:  11/29/2013  Documentation initiated by:  Naser Schuld  Subjective/Objective Assessment:   dyspnea, hypothermia, bilateral massive P.E.'s by ct angiogram     Action/Plan:   From home lives with a nephew/has other family-son.  will follow top see if snf placement is needed or can patient return to home   Anticipated DC Date:  12/02/2013   Anticipated DC Plan:  [place]  In-house referral  Clinical Social Worker      DC Planning Services  CM consult      [facility] Choice  NA   Choice offered to / List presented to:  NA   DME arranged  NA      DME agency  NA     [place] arranged  NA      [place] agency  NA   Status of service:  In process, will continue to follow Medicare Important Message given?   (If response is "NO", the following Medicare IM given date fields will be blank) Date Medicare IM given:   Medicare IM given by:   Date Additional Medicare IM given:   Additional Medicare IM given by:    Discharge Disposition:    Per UR Regulation:  Reviewed for med. necessity/level of care/duration of stay  If discussed at [place] of Stay Meetings, dates discussed:    Comments:  [id number]/Warden Buffa Rosana Hoes, RN, BSN, CCM Chart reviewed. Discharge needs and patient's stay to be reviewed and followed by case manager.

## 2013-11-29 NOTE — Progress Notes (Signed)
[place] Psychosocial Distress Screening Clinical Social Work  Clinical Social Work was referred by distress screening protocol.  The patient scored a 6 on the Psychosocial Distress Thermometer which indicates moderate distress. Clinical Social Worker reviewed chart and became aware pt is currently admitted to hospital.  Pt concerns were physical in nature and appear to have been addressed. CSW left message to assess for distress and other psychosocial needs.   ONCBCN DISTRESS SCREENING 11/27/2013  Screening Type Change in Status  Mark the number that describes how much distress you have been experiencing in the past week 6  Emotional problem type   Information Concerns Type (No Data)  Physical Problem type Constipation/diarrhea  Other      Clinical Social Worker follow up needed: Yes.    If yes, follow up plan: CSW to follow up at possible future appointment at [facility].  Loren Racer, [place] Worker [place]  [place] Phone: [phone] Fax: [phone]

## 2013-11-29 NOTE — Progress Notes (Signed)
ANTIBIOTIC CONSULT NOTE - FOLLOW UP  Pharmacy Consult for Vancomycin/Zosyn Indication: sepsis  Allergies  Allergen Reactions  . Cataflam [Diclofenac] Other (See Comments)    Unknown; patient is unaware of allergy.  . Cephalosporins Shortness Of Breath  . Erythromycin Shortness Of Breath  . Keflex [Cephalexin] Shortness Of Breath  . Codeine Nausea And Vomiting  . Darvocet [Propoxyphene N-Acetaminophen] Other (See Comments)    Unknown; patient is unaware of allergy  . Naproxen Other (See Comments)    Unknown; patient is unaware of allergy  . Penicillins Other (See Comments)    Patient states that she CAN take this; unaware of allergy  . Synalgos-Dc [Aspirin-Caff-Dihydrocodeine] Other (See Comments)    Unknown; patient is unaware of allergy  . Ultram [Tramadol] Other (See Comments)    Unknown; patient is unaware of allergy    Patient Measurements: Height: 5\' 7"  (170.2 cm) Weight: 132 lb 15 oz (60.3 kg) IBW/kg (Calculated) : 61.6  Vital Signs: Temp: 97.9 F (36.6 C) (10/21 0800) Temp Source: Oral (10/21 0800) BP: 108/33 mmHg (10/21 1000) Pulse Rate: 65 (10/21 1000) Intake/Output from previous day: 10/20 0701 - 10/21 0700 In: 1416.8 [P.O.:810; I.V.:556.8; IV Piggyback:50] Out: 535 [Urine:535] Intake/Output from this shift: Total I/O In: 693.5 [P.O.:240; I.V.:453.5] Out: 125 [Urine:125]  Labs:  Recent Labs  11/28/13 1912 11/28/13 2239 11/29/13 0520  WBC 4.2 5.9 8.0  HGB 12.6 12.1 10.2*  PLT 166 133* 140*  CREATININE 0.81 0.74 0.77   Estimated Creatinine Clearance: 50.7 ml/min (by C-G formula based on Cr of 0.77). No results found for this basename: Letta Median, [place], [place], [place], GENTRANDOM, [place], TOBRAPEAK, TOBRARND, AMIKACINPEAK, AMIKACINTROU, AMIKACIN,  in the last 72 hours    Assessment: 80 yo female with rectal adenocarcinoma diagnosed in August of 2015, ? potential hepatic metastasis per recent MRI, currently on radiation  therapy concomitantly with oral Xeloda at 500 mg twice a day started on 10/25/2013 presents with AMS and acute respiratory failure.  Code sepsis called.  Pharmacy consulted to dose vancomycin and Zosyn.  10/20 >> Vancomycin >> 10/20 >> Zosyn >>  Tmax: 99.1 WBC: WNL Renal: SCr 0.77, CrCl~50 ml/min (CG, using adjusted SCr 0.8 for age) PCT 2.44 (10/20) > 6.17 (10/21)  10/20 MRSA screen neg 10/20 urine: sent 10/20 urine (again): sent 10/20 blood x 2: sent  Today is day #2 Vancomycin and Zosyn for sepsis 2/2 ?colitis or UTI.   Goal of Therapy:  Vancomycin trough level 15-20 mcg/ml  Plan:  1.  Adjust vancomycin to 500 mg IV q12h. 2.  Continue Zosyn 3.375g IV q8h (4 hour infusion time).  3.  F/u culture results, SCr, trough levels.  Hershal Coria 11/29/2013,12:30 PM

## 2013-11-29 NOTE — Progress Notes (Signed)
Progress note:  Bethany [name] is 3  fractions of radiation therapy shy of completing "preoperative" chemoradiation for locally advanced rectal cancer. She completed whole pelvic radiotherapy yesterday and was scheduled for a reduced field boost. She can be rested the remainder of this week and we can make a decision as to whether not to proceed with a boost later next week. After speaking with her, she is certainly back to her baseline mental status. She is a wonderful lady.

## 2013-11-29 NOTE — Progress Notes (Signed)
Received notification of patient's admission to [facility]. Called Dr Valere Dross at [facility] and informed him of pt's status. Per Dr Valere Dross, patient on hold from radiation treatment until further notice. Verbally notified Faith, RT on linac #2.

## 2013-11-29 NOTE — Progress Notes (Signed)
[facility]                                                                              Patient Demographics  Bethany [name], is a 80 y.o. female, DOB - June 02, 1928, FXT:[id number]  Admit date - 11/28/2013   Admitting Physician Theodis Blaze, MD  Outpatient Primary MD for the patient is [name], MD  LOS - 1   Chief Complaint  Patient presents with  . Altered Mental Status      HPI on 11/28/2013 Patient is 80 yo female with rectal adenocarcinoma diagnosed in August of 2015, ? potential hepatic metastasis per recent MRI, currently on radiation therapy concomitantly with oral Xeloda at 500 mg twice a day started on 10/25/2013. Patient presented via EMS to [facility] ED after noted to have altered mental status at home by family member. Patient was unable to provide meaningful history upon admission, most of the information obtained from available records and ED doctor. Patient apparently saw her oncologist earlier in the day around 3 pm and was at her baseline mental state of functioning. She was noted to be confused around 5 pm and less responsive. Per son, patient appeared to have dyspnea but no clear symptoms noted such as fevers, chills, abd concerns. In ED, patient noted to be hypothermic with T 96.53F, initially hypotensive with BP 90/50, RR in mid 30's, HR 100 - 180 bpm. Blood work notable for lactic acid > 5, CT chest angio significant for pulmonary embolism. Ct abd c/w acute colitis, enteritis. Patient started on Heparin drip, TRH asked to admit to SDU. PCCM consulted.   Assessment & Plan   Acute respiratory failure secondary to pulmonary embolism -CTA chest: Bilateral pulmonary emboli, evidence of right heart strain consistent with at least a submassive PE -Will continue patient on heparin drip -Patient has history of pulmonary embolism in 2013 was taken off her Coumadin in 2014 do to interactions with chemotherapy -Spoke with her oncologist, Dr. Alen Blew, who agreed with starting  patient on Xarelto -Will order Xarelto to begin on 12/01/2013 per pharmacy  Acute encephalopathy -Likely multifactorial including sepsis versus respiratory failure due to pulmonary embolism -Appears to have resolved, patient is at baseline according to family at bedside  Septic shock  -Present at admission, patients hypothermic, hypotensive, tachycardic, tachypneic with lactic acid level for 5 -Appears to be improving, patient's vitals have now stabilized -Possibly secondary to questionable colitis caused by chemotherapy -UA: Due to 2 WBC, negative leukocytes or nitrites -Chest x-ray showed no acute disease -Continue IV fluids -Repeat lactic acid 2.4 -Was placed on empiric vanc and zosyn  Colitis versus enteritis -Likely secondary to chemotherapy -Noted on CT of the abdomen: Diffuse wall thickening and edema involving the colon as well as distal small bowel, findings consistent with colitis/enteritis -Continue empiric vanc and Zosyn  Atrial fibrillation with RVR -Evident during admission, patient was started on a diltiazem drip which was weaned off at admission due to hypotension -Likely secondary to pulmonary embolism with right heart strain -Patient has converted to inus rhythm -Troponins cycled and negative  Rectal cancer with potential liver metastases -Patient will need to follow up with Dr. Almira Coaster discharge -Will hold chemotherapy  Hypokalemia -Will replace and continue to monitor BMP  Hypothyroidism -Continue Synthroid -TSH 1.7  Addressed CODE STATUS -Patient did agree to be DO NOT RESUSCITATE  Code Status: DO NOT RESUSCITATE  Family Communication: Nephew at bedside  Disposition Plan: Admitted, will continue to monitor the step down unit.  Time Spent in minutes   45 minutes  Procedures  None  Consults   PCCM Oncology, Dr. Alen Blew, via phone  DVT Prophylaxis  heparin  Lab Results  Component Value Date   PLT 140* 11/29/2013     Medications  Scheduled Meds: . antiseptic oral rinse  7 mL Mouth Rinse BID  . feeding supplement (RESOURCE BREEZE)  1 Container Oral BID BM  . levothyroxine  25 mcg Oral Once per day on Sun Mon Wed Fri  . [START ON 11/30/2013] levothyroxine  50 mcg Oral Once per day on Tue Thu Sat  . piperacillin-tazobactam (ZOSYN)  IV  3.375 g Intravenous 3 times per day  . senna  1 tablet Oral BID  . vancomycin  500 mg Intravenous Q12H   Continuous Infusions: . 0.9 % NaCl with KCl 40 mEq / L 75 mL/hr (11/29/13 0800)  . heparin 950 Units/hr (11/29/13 0900)   PRN Meds:.HYDROmorphone (DILAUDID) injection, ondansetron (ZOFRAN) IV, ondansetron  Antibiotics    Anti-infectives   Start     Dose/Rate Route Frequency Ordered Stop   11/29/13 2200  vancomycin (VANCOCIN) IVPB 750 mg/150 ml premix  Status:  Discontinued     750 mg 150 mL/hr over 60 Minutes Intravenous Every 24 hours 11/28/13 2303 11/29/13 1229   11/29/13 2200  vancomycin (VANCOCIN) 500 mg in sodium chloride 0.9 % 100 mL IVPB     500 mg 100 mL/hr over 60 Minutes Intravenous Every 12 hours 11/29/13 1229     11/29/13 0600  piperacillin-tazobactam (ZOSYN) IVPB 3.375 g     3.375 g 12.5 mL/hr over 240 Minutes Intravenous 3 times per day 11/28/13 2302     11/28/13 2015  vancomycin (VANCOCIN) IVPB 1000 mg/200 mL premix     1,000 mg 200 mL/hr over 60 Minutes Intravenous  Once 11/28/13 2009 11/28/13 2254   11/28/13 2015  piperacillin-tazobactam (ZOSYN) IVPB 3.375 g     3.375 g 100 mL/hr over 30 Minutes Intravenous  Once 11/28/13 2009 11/28/13 2153        Subjective:   Bethany [name] seen and examined today.  Patient complains of left thigh cramping.  She denies shortness of breath or chest pain at this time.  She denies abdominal pain. Admits to having diarrhea before admission however no other episodes. Denies nausea vomiting at this time.  Objective:   Filed Vitals:   11/29/13 0530 11/29/13 0600 11/29/13 0800 11/29/13 1000  BP: 99/49  98/44 114/46 108/33  Pulse: 76 75 74 65  Temp:   97.9 F (36.6 C)   TempSrc:   Oral   Resp: 17 28 23 17   Height:      Weight:      SpO2: 100% 100% 100% 100%    Wt Readings from Last 3 Encounters:  11/28/13 60.3 kg (132 lb 15 oz)  11/27/13 58.741 kg (129 lb 8 oz)  11/23/13 58.922 kg (129 lb 14.4 oz)     Intake/Output Summary (Last 24 hours) at 11/29/13 1252 Last data filed at 11/29/13 1100  Gross per 24 hour  Intake 2110.26 ml  Output    660 ml  Net 1450.26 ml    Exam  General: Well developed, thin, elderly lady,  NAD, appears stated age  75: NCAT, mucous membranes moist.   Cardiovascular: S1 S2 auscultated, no rubs, murmurs or gallops. Regular rate and rhythm.  Respiratory: Clear to auscultation bilaterally with equal chest rise  Abdomen: Soft, nontender, nondistended, + bowel sounds  Extremities: warm dry without cyanosis clubbing or edema  Neuro: AAOx3, no focal deficits  Psych: Appropriate mood and affect   Data Review   Micro Results Recent Results (from the past 240 hour(s))  MRSA PCR SCREENING     Status: None   Collection Time    11/28/13 10:50 PM      Result Value Ref Range Status   MRSA by PCR NEGATIVE  NEGATIVE Final   Comment:            The GeneXpert MRSA Assay (FDA     approved for NASAL specimens     only), is one component of a     comprehensive MRSA colonization     surveillance program. It is not     intended to diagnose MRSA     infection nor to guide or     monitor treatment for     MRSA infections.     Performed at [facility]    Radiology Reports Ct Head Wo Contrast  11/28/2013   CLINICAL DATA:  Patient found unresponsive at home today.  EXAM: CT HEAD WITHOUT CONTRAST  CT CERVICAL SPINE WITHOUT CONTRAST  TECHNIQUE: Multidetector CT imaging of the head and cervical spine was performed following the standard protocol without intravenous contrast. Multiplanar CT image reconstructions of the cervical spine were also  generated.  COMPARISON:  Head and cervical spine CT scans 03/11/2007. Head CT scan 11/11/2009.  FINDINGS: CT HEAD FINDINGS  Chronic microvascular ischemic change and mild atrophy are noted. There is no evidence of acute intracranial abnormality including hemorrhage, infarct, mass lesion, mass effect, midline shift or abnormal extra-axial fluid collection. There is no hydrocephalus or pneumocephalus. The calvarium is intact.  CT CERVICAL SPINE FINDINGS  No fracture or malalignment of the cervical spine is identified. Loss of disc space height is most notable at C5-6 and C6-7. Lung apices are clear.  IMPRESSION: No acute finding head or cervical spine.  Chronic microvascular ischemic change and mild atrophy.  Mild appearing cervical spondylosis.   Electronically Signed   By: Inge Rise M.D.   On: 11/28/2013 20:58   Ct Angio Chest Pe W/cm &/or Wo Cm  11/28/2013   CLINICAL DATA:  Shortness of breath,, history of colorectal cancer, IVC feeding chemo and radiation, hypoxia history of pulmonary embolus  EXAM: CT ANGIOGRAPHY CHEST WITH CONTRAST  TECHNIQUE: Multidetector CT imaging of the chest was performed using the standard protocol during bolus administration of intravenous contrast. Multiplanar CT image reconstructions and MIPs were obtained to evaluate the vascular anatomy.  CONTRAST:  139mL OMNIPAQUE IOHEXOL 350 MG/ML SOLN  COMPARISON:  09/22/2013  FINDINGS: Sagittal images of the spine shows osteopenia and degenerative changes thoracic spine. Air is noted within esophagus. There is some fluid in lower esophagus probable from gastroesophageal reflux.  There is no mediastinal hematoma or adenopathy. Heart size within normal limits.  Axial image 32 pulmonary emboli are noted into segmental branches right upper lobe.  There is pulmonary embolus in right main pulmonary artery at the takeoff of right lower lobe branch. Best seen in axial made 51. Pulmonary embolus is noted in left upper lobe segmental branch  axial image 40. At least 1 pulmonary embolus is noted segmental branch in left  lower lobe axial image 54. Findings are consistent with bilateral PE. There is bilateral lower lobe posterior atelectasis. Right ventricle measures 3.9 cm. Left ventricle measures 3.6 cm in diameter. Right ventricle over left ventricle room ratio measures 1.08  Review of the MIP images confirms the above findings.  IMPRESSION: 1. Bilateral pulmonary emboli as described above. Positive for acute PE with CT evidence of right heart strain (RV/LV Ratio = 1.08) consistent with at least submassive (intermediate risk) PE. The presence of right heart strain has been associated with an increased risk of morbidity and mortality. Consultation with Pulmonary and Critical Care Medicine is recommended.  2. Degenerative changes thoracic spine. 3. Small amount of air is noted in upper esophagus. Fluid is noted in distal esophagus probable from gastroesophageal reflux. 4. Bilateral lower lobe posterior atelectasis. 5. No pulmonary edema.   Electronically Signed   By: Lahoma Crocker M.D.   On: 11/28/2013 21:09   Ct Cervical Spine Wo Contrast  11/28/2013   CLINICAL DATA:  Patient found unresponsive at home today.  EXAM: CT HEAD WITHOUT CONTRAST  CT CERVICAL SPINE WITHOUT CONTRAST  TECHNIQUE: Multidetector CT imaging of the head and cervical spine was performed following the standard protocol without intravenous contrast. Multiplanar CT image reconstructions of the cervical spine were also generated.  COMPARISON:  Head and cervical spine CT scans 03/11/2007. Head CT scan 11/11/2009.  FINDINGS: CT HEAD FINDINGS  Chronic microvascular ischemic change and mild atrophy are noted. There is no evidence of acute intracranial abnormality including hemorrhage, infarct, mass lesion, mass effect, midline shift or abnormal extra-axial fluid collection. There is no hydrocephalus or pneumocephalus. The calvarium is intact.  CT CERVICAL SPINE FINDINGS  No fracture or  malalignment of the cervical spine is identified. Loss of disc space height is most notable at C5-6 and C6-7. Lung apices are clear.  IMPRESSION: No acute finding head or cervical spine.  Chronic microvascular ischemic change and mild atrophy.  Mild appearing cervical spondylosis.   Electronically Signed   By: Inge Rise M.D.   On: 11/28/2013 20:58   Ct Abdomen Pelvis W Contrast  11/28/2013   CLINICAL DATA:  Rectal cancer and abdominal pain.  EXAM: CT ABDOMEN AND PELVIS WITH CONTRAST  TECHNIQUE: Multidetector CT imaging of the abdomen and pelvis was performed using the standard protocol following bolus administration of intravenous contrast.  CONTRAST:  124mL OMNIPAQUE IOHEXOL 350 MG/ML SOLN  COMPARISON:  10/24/2013  FINDINGS: Since the prior study, the patient has developed diffuse wall thickening and edema involving the colon as well as several loops of distal small bowel. There is associated free fluid scattered in the mesenteries and in the dependent pelvis. There is no evidence of bowel perforation or overt bowel obstruction and findings likely reflect colitis and enteritis. Component of ischemia cannot be excluded, but the major mesenteric arteries appear patent. There is no evidence of mesenteric venous or portal venous thrombosis. No focal abscess is identified.  Rectal thickening a again noted consistent with known rectal cancer. Stable partially calcified right pelvic mass. No visible enlarged lymph nodes.  The liver, gallbladder, pancreas, spleen, adrenal glands and kidneys are unremarkable. Bilateral parapelvic renal cysts are again noted without hydronephrosis.  The bladder is decompressed by a Foley catheter.  IMPRESSION: Development of diffuse wall thickening and edema involving the colon as well as distal small bowel. Findings are consistent with colitis/enteritis. No associated overt bowel obstruction or perforation.   Electronically Signed   By: Aletta Edouard M.D.   On:  11/28/2013 21:08    Dg Chest Portable 1 View  11/28/2013   CLINICAL DATA:  Altered mental status. Recent diagnosis of colon cancer.  EXAM: PORTABLE CHEST - 1 VIEW  COMPARISON:  Single view of the chest 04/25/2013. CT chest 09/22/2013.  FINDINGS: Lung volumes are lower than on the comparison plain film of the chest with crowding of the bronchovascular structures. No consolidative process, pneumothorax or effusion is identified. Heart size is normal.  IMPRESSION: No acute disease in a low volume chest.   Electronically Signed   By: Inge Rise M.D.   On: 11/28/2013 19:36    CBC  Recent Labs Lab 11/23/13 1425 11/28/13 1912 11/28/13 2239 11/29/13 0520  WBC 4.5 4.2 5.9 8.0  HGB 11.0* 12.6 12.1 10.2*  HCT 33.4* 37.9 36.4 30.2*  PLT 152 166 133* 140*  MCV 92.5 91.8 92.2 90.1  MCH 30.4 30.5 30.6 30.4  MCHC 32.8 33.2 33.2 33.8  RDW 19.7* 18.6* 18.4* 18.6*  LYMPHSABS 0.2* 0.3* 0.2*  --   MONOABS 0.5 0.3 0.6  --   EOSABS 0.1 0.1 0.0  --   BASOSABS 0.0 0.0 0.0  --     Chemistries   Recent Labs Lab 11/23/13 1426 11/28/13 1912 11/28/13 2239 11/29/13 0520  NA 140 139 139 138  K 4.0 3.0* 2.7* 3.4*  CL  --  98 104 103  CO2 28 20 20 22   GLUCOSE 103 230* 145* 111*  BUN 16.5 19 17 17   CREATININE 0.7 0.81 0.74 0.77  CALCIUM 9.6 8.7 7.6* 7.4*  AST 32 36 31  --   ALT 52 43* 36*  --   ALKPHOS 110 127* 100  --   BILITOT 1.01 1.4* 1.4*  --    ------------------------------------------------------------------------------------------------------------------ estimated creatinine clearance is 50.7 ml/min (by C-G formula based on Cr of 0.77). ------------------------------------------------------------------------------------------------------------------ No results found for this basename: HGBA1C,  in the last 72 hours ------------------------------------------------------------------------------------------------------------------ No results found for this basename: CHOL, HDL, LDLCALC, TRIG, CHOLHDL,  LDLDIRECT,  in the last 72 hours ------------------------------------------------------------------------------------------------------------------  Recent Labs  11/28/13 2239  TSH 1.700   ------------------------------------------------------------------------------------------------------------------ No results found for this basename: VITAMINB12, FOLATE, FERRITIN, TIBC, IRON, RETICCTPCT,  in the last 72 hours  Coagulation profile  Recent Labs Lab 11/28/13 1912  INR 1.29    No results found for this basename: DDIMER,  in the last 72 hours  Cardiac Enzymes  Recent Labs Lab 11/28/13 2239 11/29/13 0520 11/29/13 1015  TROPONINI <0.30 <0.30 <0.30   ------------------------------------------------------------------------------------------------------------------ No components found with this basename: POCBNP,     Gilma Bessette D.O. on 11/29/2013 at 12:52 PM  Between 7am to 7pm - Pager - [phone]  After 7pm go to [email or web address] - password [email or web address]  And look for the night coverage person covering for me after hours  [facility] Office  [phone]

## 2013-11-29 NOTE — Progress Notes (Signed)
[facility] Progress Note Patient Name: Bethany [name] DOB: 1928-07-17 MRN: [id number]   Date of Service  11/29/2013  HPI/Events of Note  Hypokalemia  eICU Interventions  Potassium replaced     Intervention Category Intermediate Interventions: Electrolyte abnormality - evaluation and management  DETERDING,ELIZABETH 11/29/2013, 1:10 AM

## 2013-11-29 NOTE — Progress Notes (Signed)
CRITICAL VALUE ALERT  Critical value received:  K-2.7  Date of notification:  11/29/2013  Time of notification:  1:12 AM  Critical value read back:Yes.    Nurse who received alert:  Dillard Essex  MD notified (1st page):  Dr. Jimmy Footman  Time of first page:  1:12 AM  MD notified (2nd page):  Time of second page:  Responding MD:  Dr. Jimmy Footman  Time MD responded:  1:12 AM

## 2013-11-30 ENCOUNTER — Ambulatory Visit: Payer: Medicare Other

## 2013-11-30 ENCOUNTER — Inpatient Hospital Stay (HOSPITAL_COMMUNITY): Payer: Medicare Other

## 2013-11-30 DIAGNOSIS — E43 Unspecified severe protein-calorie malnutrition: Secondary | ICD-10-CM

## 2013-11-30 DIAGNOSIS — R52 Pain, unspecified: Secondary | ICD-10-CM

## 2013-11-30 DIAGNOSIS — M79609 Pain in unspecified limb: Secondary | ICD-10-CM

## 2013-11-30 LAB — BASIC METABOLIC PANEL
Anion gap: 9 (ref 5–15)
BUN: 15 mg/dL (ref 6–23)
CALCIUM: 7.7 mg/dL — AB (ref 8.4–10.5)
CO2: 20 meq/L (ref 19–32)
CREATININE: 0.65 mg/dL (ref 0.50–1.10)
Chloride: 110 mEq/L (ref 96–112)
GFR calc Af Amer: >90 mL/min (ref >90)
GFR, EST NON AFRICAN AMERICAN: 80 mL/min — AB (ref >90)
GLUCOSE: 103 mg/dL — AB (ref 70–99)
Potassium: 4.2 mEq/L (ref 3.7–5.3)
Sodium: 139 mEq/L (ref 137–147)

## 2013-11-30 LAB — CBC
HEMATOCRIT: 24.7 % — AB (ref 36.0–46.0)
HEMOGLOBIN: 8.5 g/dL — AB (ref 12.0–15.0)
MCH: 31.5 pg (ref 26.0–34.0)
MCHC: 34.4 g/dL (ref 30.0–36.0)
MCV: 91.5 fL (ref 78.0–100.0)
Platelets: 114 10*3/uL — ABNORMAL LOW (ref 150–400)
RBC: 2.7 MIL/uL — AB (ref 3.87–5.11)
RDW: 19.5 % — ABNORMAL HIGH (ref 11.5–15.5)
WBC: 5.1 10*3/uL (ref 4.0–10.5)

## 2013-11-30 LAB — PROCALCITONIN: Procalcitonin: 4.83 ng/mL

## 2013-11-30 LAB — URINE CULTURE
COLONY COUNT: NO GROWTH
Culture: NO GROWTH

## 2013-11-30 LAB — HEPARIN LEVEL (UNFRACTIONATED): Heparin Unfractionated: 0.44 IU/mL (ref 0.30–0.70)

## 2013-11-30 LAB — LACTIC ACID, PLASMA: Lactic Acid, Venous: 0.7 mmol/L (ref 0.5–2.2)

## 2013-11-30 MED ORDER — RIVAROXABAN 15 MG PO TABS
15.0000 mg | ORAL_TABLET | Freq: Two times a day (BID) | ORAL | Status: DC
  Administered 2013-11-30 – 2013-12-01 (×4): 15 mg via ORAL
  Filled 2013-11-30 (×5): qty 1

## 2013-11-30 MED ORDER — MORPHINE SULFATE 2 MG/ML IJ SOLN
1.0000 mg | INTRAMUSCULAR | Status: DC | PRN
  Administered 2013-11-30: 1 mg via INTRAVENOUS
  Filled 2013-11-30: qty 1

## 2013-11-30 MED ORDER — RIVAROXABAN (XARELTO) EDUCATION KIT FOR DVT/PE PATIENTS
PACK | Freq: Once | Status: DC
  Filled 2013-11-30: qty 1

## 2013-11-30 NOTE — Progress Notes (Signed)
[facility] for Heparin --> Xarelto Indication: pulmonary embolus  Allergies  Allergen Reactions  . Cataflam [Diclofenac] Other (See Comments)    Unknown; patient is unaware of allergy.  . Cephalosporins Shortness Of Breath  . Erythromycin Shortness Of Breath  . Keflex [Cephalexin] Shortness Of Breath  . Codeine Nausea And Vomiting  . Darvocet [Propoxyphene N-Acetaminophen] Other (See Comments)    Unknown; patient is unaware of allergy  . Naproxen Other (See Comments)    Unknown; patient is unaware of allergy  . Penicillins Other (See Comments)    Patient states that she CAN take this; unaware of allergy  . Synalgos-Dc [Aspirin-Caff-Dihydrocodeine] Other (See Comments)    Unknown; patient is unaware of allergy  . Ultram [Tramadol] Other (See Comments)    Unknown; patient is unaware of allergy    Patient Measurements: Height: 5\' 7"  (170.2 cm) Weight: 122 lb 9.2 oz (55.6 kg) IBW/kg (Calculated) : 61.6 Heparin dosing weight: using actual body weight of 60 kg  Vital Signs: Temp: 98.6 F (37 C) (10/22 0800) Temp Source: Core (Comment) (10/22 0800) BP: 153/71 mmHg (10/22 0800) Pulse Rate: 78 (10/22 0800)  Labs:  Recent Labs  11/28/13 1912 11/28/13 2239 11/29/13 0520 11/29/13 0630 11/29/13 1015 11/29/13 1349 11/30/13 0320  HGB 12.6 12.1 10.2*  --   --   --  8.5*  HCT 37.9 36.4 30.2*  --   --   --  24.7*  PLT 166 133* 140*  --   --   --  114*  APTT 29  --   --   --   --   --   --   LABPROT 16.2*  --   --   --   --   --   --   INR 1.29  --   --   --   --   --   --   HEPARINUNFRC  --   --   --  0.35  --  0.43 0.44  CREATININE 0.81 0.74 0.77  --   --   --  0.65  TROPONINI  --  <0.30 <0.30  --  <0.30  --   --     Estimated Creatinine Clearance: 46.8 ml/min (by C-G formula based on Cr of 0.65).  Assessment: 31 yoF with rectal adenocarcinoma diagnosed in August of 2015, ? potential hepatic metastasis per recent MRI, currently on  radiation therapy concomitantly with oral Xeloda at 500 mg twice a day started on 10/25/2013 presents with AMS and acute respiratory failure.  CTA chest showed bilateral pulmonary emboli, evidence of right heart strain consistent with at least a submassive PE.  Patient has history of pulmonary embolism in 2013 was taken off her Coumadin in 2014 do to interactions with chemotherapy.  Pharmacy consulted to manage heparin infusion and re-consulted to transition to [place] 10/22.  Today, 10/22:  Heparin level this AM has remained therapeutic with infusion at 950 units/hr.  CBC: Hgb 8.5, platelets 114K, both trended down today. No bleeding noted.  Will f/u CBC in AM.  SCr 0.65, CrCl~46 ml/min (CG, using adjusted SCr 0.8 for age)  Goal of Therapy:  VTE Treatment   Plan:  1.  Stop heparin infusion.  Give first dose of Xarelto at the time of discontinuation of heparin. 2.  Start Xarelto 15 mg PO BID x 21 days then switch to Xarelto 20 mg daily.   3.  CBC in AM. 4.  Xarelto education to follow.  Hershal Coria  11/30/2013,8:25 AM

## 2013-11-30 NOTE — Evaluation (Signed)
Occupational Therapy Evaluation Patient Details Name: Bethany [name] MRN: [id number] DOB: [birth year] Today's Date: 11/30/2013    History of Present Illness This 80 y.o. female admitted 11/28/13 with AMS, decreased responsiveness and dyspnea.   CT angio revealed PE.  CT abd c/w acute colitis, enteritis.  PMH:  rectal adenocarcinoma diagnosed 8/15 with possible hepatic metastasis.     Clinical Impression   Pt admitted with above. She demonstrates the below listed deficits and will benefit from continued OT to maximize safety and independence with BADLs.  Pt presents to OT with generalized weakness, pain Lt. Hip (reports fall on Tues resulting in pain).  She is very anxious and fearful of moving and is self limiting.  She requires max encouragment to participate.  Overall she requires max - total A with BADLs, and mod A +2 for functional transfers.  She will require 24 hour assistance at discharge, unsure if family will be able to provide necessary level of care, therefore, recommend SNF level rehab.       Follow Up Recommendations  SNF;Supervision/Assistance - 24 hour    Equipment Recommendations  3 in 1 bedside comode    Recommendations for Other Services       Precautions / Restrictions Precautions Precautions: Fall Precaution Comments: Pt indicates she fell at home on Tues and complains of Lt. hip/LE pain.   She is very fearful of falling  Restrictions Weight Bearing Restrictions: No      Mobility Bed Mobility Overal bed mobility: Needs Assistance;+2 for physical assistance Bed Mobility: Supine to Sit;Sit to Supine     Supine to sit: Mod assist;+2 for physical assistance Sit to supine: Mod assist;+2 for physical assistance   General bed mobility comments: Pt requires assist for all aspects.  She is very anxious with all mobility and complains of Lt. hip pain.  Requires max cues and encouragement   Transfers Overall transfer level: Needs assistance   Transfers: Sit to/from  Stand;Stand Pivot Transfers Sit to Stand: Mod assist;+2 physical assistance Stand pivot transfers: Mod assist;+2 physical assistance       General transfer comment: Pt requires max encouragement.  She is very anxious with all mobility.  She requires assist to move into standing and assist to maneuver walker and for balance     Balance Overall balance assessment: Needs assistance Sitting-balance support: Feet supported Sitting balance-Leahy Scale: Fair     Standing balance support: Bilateral upper extremity supported Standing balance-Leahy Scale: Poor                              ADL Overall ADL's : Needs assistance/impaired Eating/Feeding: Moderate assistance;Bed level Eating/Feeding Details (indicate cue type and reason): pt requires encouragement and doesn't initiate Grooming: Wash/dry face;Moderate assistance;Sitting   Upper Body Bathing: Maximal assistance;Sitting   Lower Body Bathing: Total assistance;Sit to/from stand   Upper Body Dressing : Total assistance;Sitting   Lower Body Dressing: Total assistance;Sit to/from stand   Toilet Transfer: Moderate assistance;+2 for safety/equipment;Stand-pivot;Ambulation;BSC;RW   Toileting- Clothing Manipulation and Hygiene: Total assistance;Sit to/from stand       Functional mobility during ADLs: Moderate assistance;+2 for physical assistance;Rolling walker General ADL Comments: Pt is very fearful.  consistently states she can't perform tasks requested, thus requiring max encouragement.       Vision                     Perception     Praxis  Pertinent Vitals/Pain Pain Assessment: Faces Faces Pain Scale: Hurts even more Pain Location: Lt hip Pain Descriptors / Indicators: Aching Pain Intervention(s): Monitored during session;Limited activity within patient's tolerance;Repositioned;Relaxation     Hand Dominance     Extremity/Trunk Assessment Upper Extremity Assessment Upper Extremity  Assessment: Generalized weakness   Lower Extremity Assessment Lower Extremity Assessment: Defer to PT evaluation   Cervical / Trunk Assessment Cervical / Trunk Assessment: Kyphotic   Communication Communication Communication: No difficulties   Cognition Arousal/Alertness: Awake/alert Behavior During Therapy: Anxious Overall Cognitive Status: No family/caregiver present to determine baseline cognitive functioning                 General Comments: cognition difficult to accurately assess due to level of anxiety with attempts to move.  Pt is easily distracted and repeats self at times.    General Comments       Exercises       Shoulder Instructions      Home Living Family/patient expects to be discharged to:: Private residence Living Arrangements: Other relatives (nephew)   Type of Home: House Home Access: Stairs to enter Technical brewer of Steps: 2 Entrance Stairs-Rails: None Home Layout: One level     Bathroom Shower/Tub: Tub/shower unit;Walk-in shower Shower/tub characteristics: Architectural technologist: Standard     Home Equipment: Environmental consultant - 2 wheels;Shower seat          Prior Functioning/Environment Level of Independence: Needs assistance    ADL's / Homemaking Assistance Needed: nephew handled housework        OT Diagnosis: Generalized weakness;Acute pain   OT Problem List: Decreased strength;Decreased activity tolerance;Impaired balance (sitting and/or standing);Decreased safety awareness;Decreased knowledge of use of DME or AE;Pain   OT Treatment/Interventions: Self-care/ADL training;Therapeutic exercise;DME and/or AE instruction;Energy conservation;Therapeutic activities;Patient/family education;Balance training    OT Goals(Current goals can be found in the care plan section) Acute Rehab OT Goals Patient Stated Goal: Pt indicates her goal is to return home  OT Goal Formulation: With patient Time For Goal Achievement: 12/14/13 Potential  to Achieve Goals: Good ADL Goals Pt Will Perform Grooming: with min assist;standing Pt Will Perform Upper Body Bathing: with supervision;with set-up;sitting Pt Will Perform Lower Body Bathing: with min assist;with adaptive equipment;sit to/from stand Pt Will Perform Upper Body Dressing: with set-up;with supervision;sitting Pt Will Perform Lower Body Dressing: with min assist;sit to/from stand Pt Will Transfer to Toilet: with min assist;ambulating;regular height toilet;bedside commode;grab bars Pt Will Perform Toileting - Clothing Manipulation and hygiene: with min assist;sit to/from stand Additional ADL Goal #1: Pt will actively participate in therapeutic activity x 25 mins with no more than 2 rest breaks.   OT Frequency: Min 2X/week   Barriers to D/C: Decreased caregiver support  unsure family can provide necessary level of assistance needed at discharge.        Co-evaluation PT/OT/SLP Co-Evaluation/Treatment: Yes Reason for Co-Treatment: For patient/therapist safety   OT goals addressed during session: ADL's and self-care      End of Session Equipment Utilized During Treatment: Rolling walker;Gait belt Nurse Communication: Mobility status  Activity Tolerance: Patient limited by pain;Other (comment) (anxiety) Patient left: in bed;with call bell/phone within reach   Time: 1410-1435 OT Time Calculation (min): 25 min Charges:  OT General Charges $OT Visit: 1 Procedure OT Evaluation $Initial OT Evaluation Tier I: 1 Procedure OT Treatments $Self Care/Home Management : 8-22 mins G-Codes:    Sharman Garrott M 12/10/13, 3:09 PM

## 2013-11-30 NOTE — Progress Notes (Addendum)
[facility]                                                                              Patient Demographics  Bethany [name], is a 80 y.o. female, DOB - 04-29-1928, NWG:[id number]  Admit date - 11/28/2013   Admitting Physician Bethany Blaze, MD  Outpatient Primary MD for the patient is Bethany Gravel, MD  LOS - 2   Chief Complaint  Patient presents with  . Altered Mental Status      HPI on 11/28/2013 Patient is 80 yo female with rectal adenocarcinoma diagnosed in August of 2015, ? potential hepatic metastasis per recent MRI, currently on radiation therapy concomitantly with oral Xeloda at 500 mg twice a day started on 10/25/2013. Patient presented via EMS to [facility] ED after noted to have altered mental status at home by family member. Patient was unable to provide meaningful history upon admission, most of the information obtained from available records and ED doctor. Patient apparently saw her oncologist earlier in the day around 3 pm and was at her baseline mental state of functioning. She was noted to be confused around 5 pm and less responsive. Per son, patient appeared to have dyspnea but no clear symptoms noted such as fevers, chills, abd concerns. In ED, patient noted to be hypothermic with T 96.81F, initially hypotensive with BP 90/50, RR in mid 30's, HR 100 - 180 bpm. Blood work notable for lactic acid > 5, CT chest angio significant for pulmonary embolism. Ct abd c/w acute colitis, enteritis. Patient started on Heparin drip, TRH asked to admit to SDU. PCCM consulted.   Assessment & Plan   Acute respiratory failure secondary to pulmonary embolism -CTA chest: Bilateral pulmonary emboli, evidence of right heart strain consistent with at least a submassive PE -Patient has history of pulmonary embolism in 2013 was taken off her Coumadin in 2014 do to interactions with chemotherapy -Spoke with her oncologist, Dr. Alen [name], who agreed with starting patient on Xarelto -Xarelto to begin  today10/22/2015 per pharmacy and discontinue heparin drip  Left lower extremity pain and hip pain with spasms -Will obtain xrays and LE doppler to rule out DVT (although patient is now fully anticoagulated) -Will start low dose flexeril   Acute encephalopathy -Likely multifactorial including sepsis versus respiratory failure due to pulmonary embolism -Resolved, patient is at baseline according to family at bedside  Septic shock  -Present at admission, patients hypothermic, hypotensive, tachycardic, tachypneic with lactic acid level for 5 -Appears to be improving, patient's vitals have now stabilized -Possibly secondary to questionable colitis caused by chemotherapy -UA: Due to 2 WBC, negative leukocytes or nitrites -Chest x-ray showed no acute disease -Continue IV fluids -Repeat lactic acid 0.7 -Was placed on empiric vanc and zosyn  Colitis versus enteritis -Likely secondary to chemotherapy -Noted on CT of the abdomen: Diffuse wall thickening and edema involving the colon as well as distal small bowel, findings consistent with colitis/enteritis -Continue empiric vanc and Zosyn -Patient had 3 loose bowel movements yesterday, CDiff pending  Atrial fibrillation with RVR -Evident during admission, patient was started on a diltiazem drip which was weaned off at admission due to hypotension -Likely secondary to pulmonary embolism with right  heart strain -Patient has converted to inus rhythm -Troponins cycled and negative  Rectal cancer with potential liver metastases -Patient will need to follow up with Dr. Alen [name] upon discharge -Will hold chemotherapy  Hypokalemia -Will replace and continue to monitor BMP  Normocytic Anemia -drop in Hb likely due to dilutional component  -Will continue to monitor CBC -Currently hemodynamically stable  Hypothyroidism -Continue Synthroid -TSH 1.7  Addressed CODE STATUS -Patient did agree to be DO NOT RESUSCITATE  Severe protein calorie  malnutrtion -Nutrition consulted and appreciated -Continue feeding supplements  Code Status: DO NOT RESUSCITATE  Family Communication: Nephew at bedside  Disposition Plan: Admitted  Time Spent in minutes   30 minutes  Procedures  None  Consults   PCCM Oncology, Dr. Alen [name], via phone  DVT Prophylaxis  heparin  Lab Results  Component Value Date   PLT 114* 11/30/2013    Medications  Scheduled Meds: . antiseptic oral rinse  7 mL Mouth Rinse BID  . feeding supplement (RESOURCE BREEZE)  1 Container Oral BID BM  . levothyroxine  25 mcg Oral Once per day on Sun Mon Wed Fri  . levothyroxine  50 mcg Oral Once per day on Tue Thu Sat  . piperacillin-tazobactam (ZOSYN)  IV  3.375 g Intravenous 3 times per day  . senna  1 tablet Oral BID  . vancomycin  500 mg Intravenous Q12H   Continuous Infusions: . heparin 950 Units/hr (11/29/13 2246)   PRN Meds:.acetaminophen, HYDROcodone-acetaminophen, morphine injection, ondansetron (ZOFRAN) IV, ondansetron  Antibiotics    Anti-infectives   Start     Dose/Rate Route Frequency Ordered Stop   11/29/13 2200  vancomycin (VANCOCIN) IVPB 750 mg/150 ml premix  Status:  Discontinued     750 mg 150 mL/hr over 60 Minutes Intravenous Every 24 hours 11/28/13 2303 11/29/13 1229   11/29/13 2200  vancomycin (VANCOCIN) 500 mg in sodium chloride 0.9 % 100 mL IVPB     500 mg 100 mL/hr over 60 Minutes Intravenous Every 12 hours 11/29/13 1229     11/29/13 0600  piperacillin-tazobactam (ZOSYN) IVPB 3.375 g     3.375 g 12.5 mL/hr over 240 Minutes Intravenous 3 times per day 11/28/13 2302     11/28/13 2015  vancomycin (VANCOCIN) IVPB 1000 mg/200 mL premix     1,000 mg 200 mL/hr over 60 Minutes Intravenous  Once 11/28/13 2009 11/28/13 2254   11/28/13 2015  piperacillin-tazobactam (ZOSYN) IVPB 3.375 g     3.375 g 100 mL/hr over 30 Minutes Intravenous  Once 11/28/13 2009 11/28/13 2153        Subjective:   Bethany [name] seen and examined today.  Patient  complains of left thigh cramping and pain.  She denies shortness of breath or chest pain, abdominal pain. Patient admits to 3 loose bowel movements yesterday.  Objective:   Filed Vitals:   11/30/13 0300 11/30/13 0400 11/30/13 0500 11/30/13 0600  BP:  131/67  165/78  Pulse: 78 72 71 73  Temp: 98.6 F (37 C) 98.6 F (37 C) 98.2 F (36.8 C) 98.4 F (36.9 C)  TempSrc: Core (Comment) Core (Comment) Core (Comment)   Resp: 27 19 20 30   Height:      Weight:  55.6 kg (122 lb 9.2 oz)    SpO2: 96% 95% 92% 95%    Wt Readings from Last 3 Encounters:  11/30/13 55.6 kg (122 lb 9.2 oz)  11/27/13 58.741 kg (129 lb 8 oz)  11/23/13 58.922 kg (129 lb 14.4 oz)  Intake/Output Summary (Last 24 hours) at 11/30/13 0759 Last data filed at 11/30/13 0600  Gross per 24 hour  Intake 2908.98 ml  Output    780 ml  Net 2128.98 ml    Exam  General: Well developed, thin, elderly lady, NAD, appears stated age  85: NCAT, mucous membranes moist.   Cardiovascular: S1 S2 auscultated, no rubs, murmurs or gallops. Regular rate and rhythm.  Respiratory: Clear to auscultation bilaterally with equal chest rise  Abdomen: Soft, nontender, nondistended, + bowel sounds  Extremities: warm dry without cyanosis clubbing or edema  Neuro: AAOx3, no focal deficits  Psych: Depressed mood and affect.   Data Review   Micro Results Recent Results (from the past 240 hour(s))  URINE CULTURE     Status: None   Collection Time    11/28/13  7:07 PM      Result Value Ref Range Status   Specimen Description URINE, CATHETERIZED   Final   Special Requests NONE   Final   Culture  Setup Time     Final   Value: 11/29/2013 00:54     Performed at [place]     Final   Value: >=100,000 COLONIES/ML     Performed at [facility]   Culture     Final   Value: ESCHERICHIA COLI     Performed at [facility]   Report Status PENDING   Incomplete  MRSA PCR SCREENING     Status:  None   Collection Time    11/28/13 10:50 PM      Result Value Ref Range Status   MRSA by PCR NEGATIVE  NEGATIVE Final   Comment:            The GeneXpert MRSA Assay (FDA     approved for NASAL specimens     only), is one component of a     comprehensive MRSA colonization     surveillance program. It is not     intended to diagnose MRSA     infection nor to guide or     monitor treatment for     MRSA infections.     Performed at [facility]  URINE CULTURE     Status: None   Collection Time    11/28/13 11:00 PM      Result Value Ref Range Status   Specimen Description URINE, CATHETERIZED   Final   Special Requests NONE   Final   Culture  Setup Time     Final   Value: 11/29/2013 04:29     Performed at [place]     Final   Value: NO GROWTH     Performed at [facility]   Culture     Final   Value: NO GROWTH     Performed at [facility]   Report Status 11/30/2013 FINAL   Final    Radiology Reports Ct Head Wo Contrast  11/28/2013   CLINICAL DATA:  Patient found unresponsive at home today.  EXAM: CT HEAD WITHOUT CONTRAST  CT CERVICAL SPINE WITHOUT CONTRAST  TECHNIQUE: Multidetector CT imaging of the head and cervical spine was performed following the standard protocol without intravenous contrast. Multiplanar CT image reconstructions of the cervical spine were also generated.  COMPARISON:  Head and cervical spine CT scans 03/11/2007. Head CT scan 11/11/2009.  FINDINGS: CT HEAD FINDINGS  Chronic microvascular ischemic change and mild atrophy are noted.  There is no evidence of acute intracranial abnormality including hemorrhage, infarct, mass lesion, mass effect, midline shift or abnormal extra-axial fluid collection. There is no hydrocephalus or pneumocephalus. The calvarium is intact.  CT CERVICAL SPINE FINDINGS  No fracture or malalignment of the cervical spine is identified. Loss of disc space height is most notable at C5-6 and C6-7.  Lung apices are clear.  IMPRESSION: No acute finding head or cervical spine.  Chronic microvascular ischemic change and mild atrophy.  Mild appearing cervical spondylosis.   Electronically Signed   By: Inge Rise M.D.   On: 11/28/2013 20:58   Ct Angio Chest Pe W/cm &/or Wo Cm  11/28/2013   CLINICAL DATA:  Shortness of breath,, history of colorectal cancer, IVC feeding chemo and radiation, hypoxia history of pulmonary embolus  EXAM: CT ANGIOGRAPHY CHEST WITH CONTRAST  TECHNIQUE: Multidetector CT imaging of the chest was performed using the standard protocol during bolus administration of intravenous contrast. Multiplanar CT image reconstructions and MIPs were obtained to evaluate the vascular anatomy.  CONTRAST:  19mL OMNIPAQUE IOHEXOL 350 MG/ML SOLN  COMPARISON:  09/22/2013  FINDINGS: Sagittal images of the spine shows osteopenia and degenerative changes thoracic spine. Air is noted within esophagus. There is some fluid in lower esophagus probable from gastroesophageal reflux.  There is no mediastinal hematoma or adenopathy. Heart size within normal limits.  Axial image 32 pulmonary emboli are noted into segmental branches right upper lobe.  There is pulmonary embolus in right main pulmonary artery at the takeoff of right lower lobe branch. Best seen in axial made 51. Pulmonary embolus is noted in left upper lobe segmental branch axial image 40. At least 1 pulmonary embolus is noted segmental branch in left lower lobe axial image 54. Findings are consistent with bilateral PE. There is bilateral lower lobe posterior atelectasis. Right ventricle measures 3.9 cm. Left ventricle measures 3.6 cm in diameter. Right ventricle over left ventricle room ratio measures 1.08  Review of the MIP images confirms the above findings.  IMPRESSION: 1. Bilateral pulmonary emboli as described above. Positive for acute PE with CT evidence of right heart strain (RV/LV Ratio = 1.08) consistent with at least submassive  (intermediate risk) PE. The presence of right heart strain has been associated with an increased risk of morbidity and mortality. Consultation with Pulmonary and Critical Care Medicine is recommended.  2. Degenerative changes thoracic spine. 3. Small amount of air is noted in upper esophagus. Fluid is noted in distal esophagus probable from gastroesophageal reflux. 4. Bilateral lower lobe posterior atelectasis. 5. No pulmonary edema.   Electronically Signed   By: Lahoma Crocker M.D.   On: 11/28/2013 21:09   Ct Cervical Spine Wo Contrast  11/28/2013   CLINICAL DATA:  Patient found unresponsive at home today.  EXAM: CT HEAD WITHOUT CONTRAST  CT CERVICAL SPINE WITHOUT CONTRAST  TECHNIQUE: Multidetector CT imaging of the head and cervical spine was performed following the standard protocol without intravenous contrast. Multiplanar CT image reconstructions of the cervical spine were also generated.  COMPARISON:  Head and cervical spine CT scans 03/11/2007. Head CT scan 11/11/2009.  FINDINGS: CT HEAD FINDINGS  Chronic microvascular ischemic change and mild atrophy are noted. There is no evidence of acute intracranial abnormality including hemorrhage, infarct, mass lesion, mass effect, midline shift or abnormal extra-axial fluid collection. There is no hydrocephalus or pneumocephalus. The calvarium is intact.  CT CERVICAL SPINE FINDINGS  No fracture or malalignment of the cervical spine is identified. Loss of disc space height  is most notable at C5-6 and C6-7. Lung apices are clear.  IMPRESSION: No acute finding head or cervical spine.  Chronic microvascular ischemic change and mild atrophy.  Mild appearing cervical spondylosis.   Electronically Signed   By: Inge Rise M.D.   On: 11/28/2013 20:58   Ct Abdomen Pelvis W Contrast  11/28/2013   CLINICAL DATA:  Rectal cancer and abdominal pain.  EXAM: CT ABDOMEN AND PELVIS WITH CONTRAST  TECHNIQUE: Multidetector CT imaging of the abdomen and pelvis was performed using  the standard protocol following bolus administration of intravenous contrast.  CONTRAST:  156mL OMNIPAQUE IOHEXOL 350 MG/ML SOLN  COMPARISON:  10/24/2013  FINDINGS: Since the prior study, the patient has developed diffuse wall thickening and edema involving the colon as well as several loops of distal small bowel. There is associated free fluid scattered in the mesenteries and in the dependent pelvis. There is no evidence of bowel perforation or overt bowel obstruction and findings likely reflect colitis and enteritis. Component of ischemia cannot be excluded, but the major mesenteric arteries appear patent. There is no evidence of mesenteric venous or portal venous thrombosis. No focal abscess is identified.  Rectal thickening a again noted consistent with known rectal cancer. Stable partially calcified right pelvic mass. No visible enlarged lymph nodes.  The liver, gallbladder, pancreas, spleen, adrenal glands and kidneys are unremarkable. Bilateral parapelvic renal cysts are again noted without hydronephrosis.  The bladder is decompressed by a Foley catheter.  IMPRESSION: Development of diffuse wall thickening and edema involving the colon as well as distal small bowel. Findings are consistent with colitis/enteritis. No associated overt bowel obstruction or perforation.   Electronically Signed   By: Aletta Edouard M.D.   On: 11/28/2013 21:08   Dg Chest Portable 1 View  11/28/2013   CLINICAL DATA:  Altered mental status. Recent diagnosis of colon cancer.  EXAM: PORTABLE CHEST - 1 VIEW  COMPARISON:  Single view of the chest 04/25/2013. CT chest 09/22/2013.  FINDINGS: Lung volumes are lower than on the comparison plain film of the chest with crowding of the bronchovascular structures. No consolidative process, pneumothorax or effusion is identified. Heart size is normal.  IMPRESSION: No acute disease in a low volume chest.   Electronically Signed   By: Inge Rise M.D.   On: 11/28/2013 19:36     CBC  Recent Labs Lab 11/23/13 1425 11/28/13 1912 11/28/13 2239 11/29/13 0520 11/30/13 0320  WBC 4.5 4.2 5.9 8.0 5.1  HGB 11.0* 12.6 12.1 10.2* 8.5*  HCT 33.4* 37.9 36.4 30.2* 24.7*  PLT 152 166 133* 140* 114*  MCV 92.5 91.8 92.2 90.1 91.5  MCH 30.4 30.5 30.6 30.4 31.5  MCHC 32.8 33.2 33.2 33.8 34.4  RDW 19.7* 18.6* 18.4* 18.6* 19.5*  LYMPHSABS 0.2* 0.3* 0.2*  --   --   MONOABS 0.5 0.3 0.6  --   --   EOSABS 0.1 0.1 0.0  --   --   BASOSABS 0.0 0.0 0.0  --   --     Chemistries   Recent Labs Lab 11/23/13 1426 11/28/13 1912 11/28/13 2239 11/29/13 0520 11/30/13 0320  NA 140 139 139 138 139  K 4.0 3.0* 2.7* 3.4* 4.2  CL  --  98 104 103 110  CO2 28 20 20 22 20   GLUCOSE 103 230* 145* 111* 103*  BUN 16.5 19 17 17 15   CREATININE 0.7 0.81 0.74 0.77 0.65  CALCIUM 9.6 8.7 7.6* 7.4* 7.7*  AST 32 36 31  --   --  ALT 52 43* 36*  --   --   ALKPHOS 110 127* 100  --   --   BILITOT 1.01 1.4* 1.4*  --   --    ------------------------------------------------------------------------------------------------------------------ estimated creatinine clearance is 46.8 ml/min (by C-G formula based on Cr of 0.65). ------------------------------------------------------------------------------------------------------------------ No results found for this basename: HGBA1C,  in the last 72 hours ------------------------------------------------------------------------------------------------------------------ No results found for this basename: CHOL, HDL, LDLCALC, TRIG, CHOLHDL, LDLDIRECT,  in the last 72 hours ------------------------------------------------------------------------------------------------------------------  Recent Labs  11/28/13 2239  TSH 1.700   ------------------------------------------------------------------------------------------------------------------ No results found for this basename: VITAMINB12, FOLATE, FERRITIN, TIBC, IRON, RETICCTPCT,  in the last 72  hours  Coagulation profile  Recent Labs Lab 11/28/13 1912  INR 1.29    No results found for this basename: DDIMER,  in the last 72 hours  Cardiac Enzymes  Recent Labs Lab 11/28/13 2239 11/29/13 0520 11/29/13 1015  TROPONINI <0.30 <0.30 <0.30   ------------------------------------------------------------------------------------------------------------------ No components found with this basename: POCBNP,     Darey Hershberger D.O. on 11/30/2013 at 7:59 AM  Between 7am to 7pm - Pager - [phone]  After 7pm go to [email or web address] - password [email or web address]  And look for the night coverage person covering for me after hours  [facility] Office  [phone]

## 2013-11-30 NOTE — Evaluation (Signed)
Physical Therapy Evaluation Patient Details Name: TAQWA DEEM MRN: [id number] DOB: August 22, 1928 Today's Date: 11/30/2013   History of Present Illness  This 80 y.o. female admitted 11/28/13 with AMS, decreased responsiveness and dyspnea.   CT angio revealed PE.  CT abd c/w acute colitis, enteritis.  PMH:  rectal adenocarcinoma diagnosed 8/15 with possible hepatic metastasis.    Clinical Impression  On eval, pt required Mod assist +2 for mobility-able to perform stand pivot bed<>bsc with RW with Max encouragement. Pt is very anxious and fearful of experiencing pain. Recommend ST rehab at SNF.     Follow Up Recommendations SNF    Equipment Recommendations  Rolling walker with 5" wheels    Recommendations for Other Services OT consult     Precautions / Restrictions Precautions Precautions: Fall Precaution Comments: Pt indicates she fell at home on Tues and complains of Lt. hip/LE pain.   She is very fearful of falling  Restrictions Weight Bearing Restrictions: No      Mobility  Bed Mobility Overal bed mobility: Needs Assistance Bed Mobility: Supine to Sit;Sit to Supine     Supine to sit: Mod assist;+2 for physical assistance Sit to supine: Mod assist;+2 for physical assistance   General bed mobility comments: Pt requires assist for all aspects.  She is very anxious with all mobility and complains of Lt. hip pain.  Requires max cues and encouragement   Transfers Overall transfer level: Needs assistance Equipment used: Rolling walker (2 wheeled) Transfers: Sit to/from Stand Sit to Stand: Mod assist;+2 physical assistance Stand pivot transfers: Mod assist;+2 physical assistance       General transfer comment: Pt requires max encouragement.  She is very anxious with all mobility.  She requires assist to move into standing and assist to maneuver walker and for balance   Ambulation/Gait                Stairs            Wheelchair Mobility    Modified Rankin  (Stroke Patients Only)       Balance Overall balance assessment: Needs assistance Sitting-balance support: Feet supported Sitting balance-Leahy Scale: Fair     Standing balance support: Bilateral upper extremity supported;During functional activity Standing balance-Leahy Scale: Poor                               Pertinent Vitals/Pain Pain Assessment: Faces Faces Pain Scale: Hurts even more Pain Location: L LE Pain Descriptors / Indicators: Aching;Sore Pain Intervention(s): Monitored during session;Limited activity within patient's tolerance;Repositioned;Relaxation    Home Living Family/patient expects to be discharged to:: Private residence Living Arrangements: Other relatives (nephew)   Type of Home: House Home Access: Stairs to enter Entrance Stairs-Rails: None Entrance Stairs-Number of Steps: 2 Home Layout: One level Home Equipment: Environmental consultant - 2 wheels;Shower seat      Prior Function Level of Independence: Needs assistance      ADL's / Homemaking Assistance Needed: nephew handled housework        Hand Dominance        Extremity/Trunk Assessment   Upper Extremity Assessment: Defer to OT evaluation           Lower Extremity Assessment: Generalized weakness;LLE deficits/detail;RLE deficits/detail RLE Deficits / Details: strength at least 3/5. c/o foot numbness LLE Deficits / Details: Pt allowed ~10-15 degrees of knee, hip flexion before c/o pain and requested PT to stop. moves ankle. c/o foot numbness  Cervical /  Trunk Assessment: Kyphotic  Communication   Communication: No difficulties  Cognition Arousal/Alertness: Awake/alert Behavior During Therapy: Anxious Overall Cognitive Status: No family/caregiver present to determine baseline cognitive functioning                 General Comments: cognition difficult to accurately assess due to level of anxiety with attempts to move.  Pt is easily distracted and repeats self at times.      General Comments General comments (skin integrity, edema, etc.): VSS stable throughout eval     Exercises        Assessment/Plan    PT Assessment Patient needs continued PT services  PT Diagnosis Difficulty walking;Abnormality of gait;Generalized weakness;Acute pain   PT Problem List Decreased strength;Decreased range of motion;Decreased activity tolerance;Decreased balance;Decreased mobility;Decreased knowledge of use of DME;Pain  PT Treatment Interventions DME instruction;Gait training;Functional mobility training;Therapeutic activities;Therapeutic exercise;Patient/family education;Balance training   PT Goals (Current goals can be found in the Care Plan section) Acute Rehab PT Goals Patient Stated Goal: Pt indicates her goal is to return home  PT Goal Formulation: With patient Time For Goal Achievement: 12/14/13 Potential to Achieve Goals: Fair    Frequency Min 3X/week   Barriers to discharge        Co-evaluation PT/OT/SLP Co-Evaluation/Treatment: Yes Reason for Co-Treatment: For patient/therapist safety PT goals addressed during session: Mobility/safety with mobility;Proper use of DME;Balance OT goals addressed during session: ADL's and self-care       End of Session Equipment Utilized During Treatment: Gait belt Activity Tolerance: Patient limited by fatigue;Patient limited by pain (limited by anxiety) Patient left: in bed;with call bell/phone within reach           Time: 1350-1435 PT Time Calculation (min): 45 min   Charges:   PT Evaluation $Initial PT Evaluation Tier I: 1 Procedure PT Treatments $Therapeutic Activity: 8-22 mins   PT G Codes:          Weston Anna, MPT Pager: [phone]

## 2013-11-30 NOTE — Progress Notes (Signed)
*  PRELIMINARY RESULTS* Vascular Ultrasound Left lower extremity venous duplex has been completed.  Preliminary findings: no evidence of DVT.  Landry Mellow, RDMS, RVT  11/30/2013, 9:04 AM

## 2013-12-01 ENCOUNTER — Ambulatory Visit: Payer: Medicare Other

## 2013-12-01 DIAGNOSIS — A047 Enterocolitis due to Clostridium difficile: Secondary | ICD-10-CM

## 2013-12-01 DIAGNOSIS — A419 Sepsis, unspecified organism: Secondary | ICD-10-CM | POA: Diagnosis not present

## 2013-12-01 LAB — BASIC METABOLIC PANEL
Anion gap: 10 (ref 5–15)
BUN: 10 mg/dL (ref 6–23)
CALCIUM: 8.1 mg/dL — AB (ref 8.4–10.5)
CO2: 23 mEq/L (ref 19–32)
Chloride: 105 mEq/L (ref 96–112)
Creatinine, Ser: 0.58 mg/dL (ref 0.50–1.10)
GFR calc Af Amer: >90 mL/min (ref >90)
GFR calc non Af Amer: 83 mL/min — ABNORMAL LOW (ref >90)
GLUCOSE: 97 mg/dL (ref 70–99)
Potassium: 4.2 mEq/L (ref 3.7–5.3)
SODIUM: 138 meq/L (ref 137–147)

## 2013-12-01 LAB — URINE CULTURE

## 2013-12-01 LAB — CBC
HEMATOCRIT: 23.2 % — AB (ref 36.0–46.0)
Hemoglobin: 7.9 g/dL — ABNORMAL LOW (ref 12.0–15.0)
MCH: 30.7 pg (ref 26.0–34.0)
MCHC: 34.1 g/dL (ref 30.0–36.0)
MCV: 90.3 fL (ref 78.0–100.0)
Platelets: 144 10*3/uL — ABNORMAL LOW (ref 150–400)
RBC: 2.57 MIL/uL — ABNORMAL LOW (ref 3.87–5.11)
RDW: 19 % — AB (ref 11.5–15.5)
WBC: 5.9 10*3/uL (ref 4.0–10.5)

## 2013-12-01 LAB — CLOSTRIDIUM DIFFICILE BY PCR: Toxigenic C. Difficile by PCR: POSITIVE — AB

## 2013-12-01 MED ORDER — METRONIDAZOLE 500 MG PO TABS
500.0000 mg | ORAL_TABLET | Freq: Three times a day (TID) | ORAL | Status: DC
  Administered 2013-12-01: 500 mg via ORAL
  Filled 2013-12-01: qty 1

## 2013-12-01 MED ORDER — RIVAROXABAN 15 MG PO TABS
15.0000 mg | ORAL_TABLET | Freq: Two times a day (BID) | ORAL | Status: DC
Start: 2013-12-01 — End: 2014-01-15

## 2013-12-01 MED ORDER — BOOST / RESOURCE BREEZE PO LIQD: 1.0000 | Freq: Two times a day (BID) | ORAL | Status: DC

## 2013-12-01 MED ORDER — INFLUENZA VAC SPLIT QUAD 0.5 ML IM SUSY: 0.5000 mL | PREFILLED_SYRINGE | INTRAMUSCULAR | Status: DC

## 2013-12-01 MED ORDER — RIVAROXABAN 20 MG PO TABS: 20.0000 mg | ORAL_TABLET | Freq: Every day | ORAL | Status: DC

## 2013-12-01 MED ORDER — INFLUENZA VAC SPLIT QUAD 0.5 ML IM SUSY
0.5000 mL | PREFILLED_SYRINGE | Freq: Once | INTRAMUSCULAR | Status: AC
  Administered 2013-12-01: 0.5 mL via INTRAMUSCULAR
  Filled 2013-12-01: qty 0.5

## 2013-12-01 MED ORDER — METRONIDAZOLE 500 MG PO TABS: 500.0000 mg | ORAL_TABLET | Freq: Three times a day (TID) | ORAL | Status: DC

## 2013-12-01 NOTE — Progress Notes (Signed)
CSW continuing to follow.  CSW followed up with pt and pt nephew at bedside.   CSW provided SNF bed offers.  Pt and pt nephew choose [facility].   CSW clarified pt questions and concerns surrounding transitioning to [facility].   CSW notified [facility] and confirmed bed availability for today.  CSW to facilitate pt discharge needs this afternoon.  Alison Murray, MSW, [place] Work [phone]

## 2013-12-01 NOTE — Progress Notes (Signed)
Pt for discharge to [facility].  CSW facilitated pt discharge needs including contacting facility, faxing pt discharge information via [facility], discussing with pt at bedside, providing RN phone number to call report [phone]), and arranging ambulance transport via [facility].  Pt coping appropriately with transition to [facility]. Pt reports that pt nephew left hospital to go to facility to complete admission paperwork, but aware that transport was going to be arranged. Pt appreciative of CSW support and assistance.  No further social work needs identified at this time.  CSW signing off.   Alison Murray, MSW, [place] Work [phone]

## 2013-12-01 NOTE — Discharge Instructions (Signed)
Pulmonary Embolism A pulmonary (lung) embolism (PE) is a blood clot that has traveled to the lung and results in a blockage of blood flow in the affected lung. Most clots come from deep veins in the legs or pelvis. PE is a dangerous and potentially life-threatening condition that can be treated if identified. CAUSES Blood clots form in a vein for different reasons. Usually several things cause blood clots. They include:  The flow of blood slows down.  The inside of the vein is damaged in some way.  The person has a condition that makes the blood clot more easily. RISK FACTORS Some people are more likely than others to develop PE. Risk factors include:   Smoking.  Being overweight (obese).  Sitting or lying still for a long time. This includes long-distance travel, paralysis, or recovery from an illness or surgery. Other factors that increase risk are:   Older age, especially over 75 years of age.  Having a family history of blood clots or if you have already had a blood clot.  Having major or lengthy surgery. This is especially true for surgery on the hip, knee, or belly (abdomen). Hip surgery is particularly high risk.  Having a long, thin tube (catheter) placed inside a vein during a medical procedure.  Breaking a hip or leg.  Having cancer or cancer treatment.  Medicines containing the female hormone estrogen. This includes birth control pills and hormone replacement therapy.  Other circulation or heart problems.  Pregnancy and childbirth.  Hormone changes make the blood clot more easily during pregnancy.  The fetus puts pressure on the veins of the pelvis.  There is a risk of injury to veins during delivery or a caesarean delivery. The risk is highest just after childbirth.  PREVENTION   Exercise the legs regularly. Take a brisk 30 minute walk every day.  Maintain a weight that is appropriate for your height.  Avoid sitting or lying in bed for long periods of  time without moving your legs.  Women, particularly those over the age of 35 years, should consider the risks and benefits of taking estrogen medicines, including birth control pills.  Do not smoke, especially if you take estrogen medicines.  Long-distance travel can increase your risk. You should exercise your legs by walking or pumping the muscles every hour.  Many of the risk factors above relate to situations that exist with hospitalization, either for illness, injury, or elective surgery. Prevention may include medical and nonmedical measures.   Your health care provider will assess you for the need for venous thromboembolism prevention when you are admitted to the hospital. If you are having surgery, your surgeon will assess you the day of or day after surgery.  SYMPTOMS  The symptoms of a PE usually start suddenly and include:  Shortness of breath.  Coughing.  Coughing up blood or blood-tinged mucus.  Chest pain. Pain is often worse with deep breaths.  Rapid heartbeat. DIAGNOSIS  If a PE is suspected, your health care provider will take a medical history and perform a physical exam. Other tests that may be required include:  Blood tests, such as studies of the clotting properties of your blood.  Imaging tests, such as ultrasound, CT, MRI, and other tests to see if you have clots in your legs or lungs.  An electrocardiogram. This can look for heart strain from blood clots in the lungs. TREATMENT   The most common treatment for a PE is blood thinning (anticoagulant) medicine, which reduces   the blood's tendency to clot. Anticoagulants can stop new blood clots from forming and old clots from growing. They cannot dissolve existing clots. Your body does this by itself over time. Anticoagulants can be given by mouth, through an intravenous (IV) tube, or by injection. Your health care provider will determine the best program for you.  Less commonly, clot-dissolving medicines  (thrombolytics) are used to dissolve a PE. They carry a high risk of bleeding, so they are used mainly in severe cases.  Very rarely, a blood clot in the leg needs to be removed surgically.  If you are unable to take anticoagulants, your health care provider may arrange for you to have a filter placed in a main vein in your abdomen. This filter prevents clots from traveling to your lungs. HOME CARE INSTRUCTIONS   Take all medicines as directed by your health care provider.  Learn as much as you can about DVT.  Wear a medical alert bracelet or carry a medical alert card.  Ask your health care provider how soon you can go back to normal activities. It is important to stay active to prevent blood clots. If you are on anticoagulant medicine, avoid contact sports.  It is very important to exercise. This is especially important while traveling, sitting, or standing for long periods of time. Exercise your legs by walking or by tightening and relaxing your leg muscles regularly. Take frequent walks.  You may need to wear compression stockings. These are tight elastic stockings that apply pressure to the lower legs. This pressure can help keep the blood in the legs from clotting. Taking Warfarin Warfarin is a daily medicine that is taken by mouth. Your health care provider will advise you on the length of treatment (usually 3-6 months, sometimes lifelong). If you take warfarin:  Understand how to take warfarin and foods that can affect how warfarin works in your body.  Too much and too little warfarin are both dangerous. Too much warfarin increases the risk of bleeding. Too little warfarin continues to allow the risk for blood clots. Warfarin and Regular Blood Testing While taking warfarin, you will need to have regular blood tests to measure your blood clotting time. These blood tests usually include both the prothrombin time (PT) and international normalized ratio (INR) tests. The PT and INR  results allow your health care provider to adjust your dose of warfarin. It is very important that you have your PT and INR tested as often as directed by your health care provider.  Warfarin and Your Diet Avoid major changes in your diet, or notify your health care provider before changing your diet. Arrange a visit with a registered dietitian to answer your questions. Many foods, especially foods high in vitamin K, can interfere with warfarin and affect the PT and INR results. You should eat a consistent amount of foods high in vitamin K. Foods high in vitamin K include:   Spinach, kale, broccoli, cabbage, collard and turnip greens, Brussels sprouts, peas, cauliflower, seaweed, and parsley.  Beef and pork liver.  Green tea.  Soybean oil. Warfarin with Other Medicines Many medicines can interfere with warfarin and affect the PT and INR results. You must:  Tell your health care provider about any and all medicines, vitamins, and supplements you take, including aspirin and other over-the-counter anti-inflammatory medicines. Be especially cautious with aspirin and anti-inflammatory medicines. Ask your health care provider before taking these.  Do not take or discontinue any prescribed or over-the-counter medicine except on the advice   of your health care provider or pharmacist. Warfarin Side Effects Warfarin can have side effects, such as easy bruising and difficulty stopping bleeding. Ask your health care provider or pharmacist about other side effects of warfarin. You will need to:  Hold pressure over cuts for longer than usual.  Notify your dentist and other health care providers that you are taking warfarin before you undergo any procedures where bleeding may occur. Warfarin with Alcohol and Tobacco   Drinking alcohol frequently can increase the effect of warfarin, leading to excess bleeding. It is best to avoid alcoholic drinks or consume only very small amounts while taking warfarin.  Notify your health care provider if you change your alcohol intake.  Do not use any tobacco products including cigarettes, chewing tobacco, or electronic cigarettes. If you smoke, quit. Ask your health care provider for help with quitting smoking. Alternative Medicines to Warfarin: Factor Xa Inhibitor Medicines  These blood thinning medicines are taken by mouth, usually for several weeks or longer. It is important to take the medicine every single day, at the same time each day.  There are no regular blood tests required when using these medicines.  There are fewer food and drug interactions than with warfarin.  The side effects of this class of medicine is similar to that of warfarin, including excessive bruising or bleeding. Ask your health care provider or pharmacist about other potential side effects. SEEK MEDICAL CARE IF:   You notice a rapid heartbeat.  You feel weaker or more tired than usual.  You feel faint.  You notice increased bruising.  Your symptoms are not getting better in the time expected.  You are having side effects of medicine. SEEK IMMEDIATE MEDICAL CARE IF:   You have chest pain.  You have trouble breathing.  You have new or increased swelling or pain in one leg.  You cough up blood.  You notice blood in vomit, in a bowel movement, or in urine.  You have a fever. Symptoms of PE may represent a serious problem that is an emergency. Do not wait to see if the symptoms will go away. Get medical help right away. Call your local emergency services (911 in the [place]). Do not drive yourself to the hospital. Document Released: 01/24/2000 Document Revised: 06/12/2013 Document Reviewed: 02/06/2013 ExitCare Patient Information 2015 [employer]. This information is not intended to replace advice given to you by your health care provider. Make sure you discuss any questions you have with your health care provider.  

## 2013-12-01 NOTE — Progress Notes (Signed)
Clinical Social Work Department BRIEF PSYCHOSOCIAL ASSESSMENT 12/01/2013  Patient:  Bethany [name], Bethany [name]     Account Number:  [id number]     Admit date:  11/28/2013  Clinical Social Worker:  Ulyess Blossom  Date/Time:  12/01/2013 10:37 AM  Referred by:  Physician  Date Referred:  12/01/2013 Referred for  SNF Placement   Other Referral:   Interview type:  Patient Other interview type:    PSYCHOSOCIAL DATA Living Status:  FAMILY Admitted from facility:   Level of care:   Primary support name:  Juleen China Shepherd/[phone] Primary support relationship to patient:  FAMILY Degree of support available:   adequate    CURRENT CONCERNS Current Concerns  Post-Acute Placement   Other Concerns:    SOCIAL WORK ASSESSMENT / PLAN CSW received referral from MD this morning stating that PT saw pt late yesterday afternoon and recommended SNF. Per MD, pt medically ready for discharge today.    CSW met with pt at bedside. CSW introduced self and explained role. Pt reports that she lives with pt nephew. CSW discussed MD and PT recommendation for short term rehab at SNF prior to returning home. Pt agreeable to [facility] search. CSW inquired with pt about current treatment for pt cancer. Pt reports that she was doing concurrent chemoradiation, but it is currently on hold. Pt shared that she has 3 more radiation treatments and CSW provided support as pt discussed that she does not feel that she'd be able to tolerate the radiation right now. CSW discussed with pt process of SNF search and that MD plans to discharge pt today. Pt provided permission for CSW to contact pt nephew, Juleen China regarding SNF placement.    CSW contacted Dr. Alen Blew office and spoke to Dr. Alen Blew RN who spoke with Dr. Alen Blew and reported that pt will no longer take oral chemo. CSW contacted Dr. Valere Dross office and spoke to Dr. Valere Dross nurse. Per Dr. Valere Dross nurse, pt has 3 radiation treatments left, but radiation oncologist  will follow up with pt next week to determine when and if pt will continue radiation.    CSW contacted pt nephew, Juleen China via telephone. CSW discussed with pt nephew about recommendation for SNF and pt being medically ready for discharge today. CSW discussed with pt nephew that CSW initiated SNF search and will follow up as soon as offers are available. Pt nephew expressed understanding.    CSW completed FL2 and initiated SNF search to [facility].    CSW to follow up with pt and pt nephew with SNF bed offers.    CSW to continue to follow to provide support and facilitate pt discharge needs this afternoon.   Assessment/plan status:  Psychosocial Support/Ongoing Assessment of Needs Other assessment/ plan:   discharge planning   Information/referral to community resources:   [facility] list    PATIENT'S/FAMILY'S RESPONSE TO PLAN OF CARE: Pt alert and oriented x 4. Pt nephew supportive and actively involved in pt care. Pt nephew expressed interest in [facility], but ultimately wants pt to be in a facility that will take good care of her.    Alison Murray, MSW, [place] Work [phone]

## 2013-12-01 NOTE — Progress Notes (Addendum)
Clinical Social Work Department CLINICAL SOCIAL WORK PLACEMENT NOTE 12/01/2013  Patient:  JORITA, BOHANON  Account Number:  [id number] Admit date:  11/28/2013  Clinical Social Worker:  Ulyess Blossom  Date/time:  12/01/2013 10:58 AM  Clinical Social Work is seeking post-discharge placement for this patient at the following level of care:   SKILLED NURSING   (*CSW will update this form in Epic as items are completed)   12/01/2013  Patient/family provided with [place] Department of Clinical Social Work's list of facilities offering this level of care within the geographic area requested by the patient (or if unable, by the patient's family).  12/01/2013  Patient/family informed of their freedom to choose among providers that offer the needed level of care, that participate in Medicare, Medicaid or managed care program needed by the patient, have an available bed and are willing to accept the patient.  12/01/2013  Patient/family informed of [facility]' ownership interest in [facility], as well as of the fact that they are under no obligation to receive care at this facility.  PASARR submitted to EDS on 12/01/2013 PASARR number received on 12/01/2013  FL2 transmitted to all facilities in geographic area requested by pt/family on  12/01/2013 FL2 transmitted to all facilities within larger geographic area on   Patient informed that his/her managed care company has contracts with or will negotiate with  certain facilities, including the following:     Patient/family informed of bed offers received:  12/01/2013 Patient chooses bed at [facility] and [place] recommends and patient chooses bed at    Patient to be transferred to  on  [facility] [place] on 12/01/2013 Patient to be transferred to facility by ambulance Corey Harold) Patient and family notified of transfer on 12/01/2013 Name of family member notified:  Pt notified at bedside and pt nephew,  Juleen China notified at bedside.   The following physician request were entered in Epic:   Additional Comments:   Alison Murray, MSW, [place] Work [phone]

## 2013-12-01 NOTE — Discharge Summary (Addendum)
Physician Discharge Summary  Bethany [name] VOZ:[id number] DOB: September 06, 1928 DOA: 11/28/2013  PCP: [name], MD  Admit date: 11/28/2013 Discharge date: 12/01/2013  Time spent: 45 minutes  Recommendations for Outpatient Follow-up:  Patient will be discharged to [facility] facility.  She is to continue physical and occupational therapy as recommended by the facility.  She is to follow up with her primary care physician within one week of discharge and Dr. Alen Blew.  Patient is to continue her medications as prescribed and a regular diet. Patient will need CBC in one week.   Discharge Diagnoses:  Acute respiratory strain and pulmonary embolism Left lower extremity pain and hip pain with spasm Acute encephalopathy Septic shock C. difficile colitis Atrial fibrillation with RVR Rectal cancer with potential liver metastasis Hypokalemia Normocytic anemia Hypothyroidism CODE STATUS discussion Severe protein calorie malnutrition  Discharge Condition: Stable  Diet recommendation: Regular  Filed Weights   11/28/13 2107 11/28/13 2303 11/30/13 0400  Weight: 58.7 kg (129 lb 6.6 oz) 60.3 kg (132 lb 15 oz) 55.6 kg (122 lb 9.2 oz)    History of present illness:  on 11/28/2013  Patient is 80 yo female with rectal adenocarcinoma diagnosed in August of 2015, ? potential hepatic metastasis per recent MRI, currently on radiation therapy concomitantly with oral Xeloda at 500 mg twice a day started on 10/25/2013. Patient presented via EMS to [facility] ED after noted to have altered mental status at home by family member. Patient was unable to provide meaningful history upon admission, most of the information obtained from available records and ED doctor. Patient apparently saw her oncologist earlier in the day around 3 pm and was at her baseline mental state of functioning. She was noted to be confused around 5 pm and less responsive. Per son, patient appeared to have dyspnea but no clear symptoms noted  such as fevers, chills, abd concerns. In ED, patient noted to be hypothermic with T 96.75F, initially hypotensive with BP 90/50, RR in mid 30's, HR 100 - 180 bpm. Blood work notable for lactic acid > 5, CT chest angio significant for pulmonary embolism. Ct abd c/w acute colitis, enteritis. Patient started on Heparin drip, TRH asked to admit to SDU. PCCM consulted.  Hospital Course:  Acute respiratory failure secondary to pulmonary embolism  -CTA chest: Bilateral pulmonary emboli, evidence of right heart strain consistent with at least a submassive PE  -Patient has history of pulmonary embolism in 2013 was taken off her Coumadin in 2014 do to interactions with chemotherapy  -Spoke with her oncologist, Dr. Alen Blew, who agreed with starting patient on Xarelto  -Xarelto to begin today10/22/2015 per pharmacy and discontinue heparin drip   Left lower extremity pain and hip pain with spasms  -Will obtain xrays and LE doppler to rule out DVT (although patient is now fully anticoagulated)  -Will start low dose flexeril   Acute encephalopathy  -Likely multifactorial including sepsis versus respiratory failure due to pulmonary embolism  -Resolved, patient is at baseline according to family at bedside   Septic shock  -Present at admission, patients hypothermic, hypotensive, tachycardic, tachypneic with lactic acid level for 5  -Appears to be improving, patient's vitals have now stabilized  -Possibly secondary to questionable colitis, which was initially thought to be due to chemotherapy -UA: Due to 2 WBC, negative leukocytes or nitrites  -Chest x-ray showed no acute disease  -Repeat lactic acid 0.7  -Was placed on empiric vanc and zosyn and IVF, which were discontinued  C. Difficile Colitis -C.  Diff PCR positive -Noted on CT of the abdomen: Diffuse wall thickening and edema involving the colon as well as distal small bowel, findings consistent with colitis/enteritis  -Patient was initially started on  vancomycin and Zosyn empirically however these were discontinued -Patient currently afebrile with no leukocytosis -Patient has had one episode of loose bowel movement in the past 24 hours -Patient started on flagyl 500mg  TID for 14 days  Atrial fibrillation with RVR  -Evident during admission, patient was started on a diltiazem drip which was weaned off at admission due to hypotension  -Likely secondary to pulmonary embolism with right heart strain  -Patient has converted to inus rhythm  -Troponins cycled and negative   Rectal cancer with potential liver metastases  -Patient will need to follow up with Dr. Alen Blew upon discharge  -Will hold chemotherapy   Hypokalemia  -Will replace and continue to monitor BMP   Normocytic Anemia  -drop in Hb likely due to dilutional component  -monitor CBC in one week -Currently hemodynamically stable   Hypothyroidism  -Continue Synthroid  -TSH 1.7   Addressed CODE STATUS  -Patient did agree to be DO NOT RESUSCITATE   Severe protein calorie malnutrtion  -Nutrition consulted and appreciated  -Continue feeding supplements   Physical deconditioning -PT/OT consulted and recommended SNF  Procedures:  None  Consultations:  None  Discharge Exam: Filed Vitals:   12/01/13 1200  BP: 141/57  Pulse: 78  Temp: 99.3 F (37.4 C)  Resp: 19   Exam  General: Well developed, thin, elderly lady, NAD, appears stated age  HEENT: NCAT, mucous membranes moist.  Cardiovascular: S1 S2 auscultated, no rubs, murmurs or gallops. Regular rate and rhythm.  Respiratory: Clear to auscultation bilaterally with equal chest rise  Abdomen: Soft, nontender, nondistended, + bowel sounds  Extremities: warm dry without cyanosis clubbing or edema  Neuro: AAOx3, no focal deficits  Psych: Appropriate mood and affect  Discharge Instructions      Discharge Instructions   Discharge instructions    Complete by:  As directed   Patient will be discharged to [facility].  She is to continue physical and occupational therapy as recommended by the facility.  She is to follow up with her primary care physician within one week of discharge and Dr. Alen Blew.  Patient is to continue her medications as prescribed and a regular diet.            Medication List    STOP taking these medications       aspirin 81 MG tablet     capecitabine 500 MG tablet  Commonly known as:  XELODA     docusate sodium 100 MG capsule  Commonly known as:  COLACE     olmesartan 40 MG tablet  Commonly known as:  BENICAR      TAKE these medications       cholecalciferol 1000 UNITS tablet  Commonly known as:  VITAMIN D  Take 2,000 Units by mouth daily.     feeding supplement (RESOURCE BREEZE) Liqd  Take 1 Container by mouth 2 (two) times daily between meals.     levothyroxine 50 MCG tablet  Commonly known as:  SYNTHROID, LEVOTHROID  Take 50 mcg by mouth daily before breakfast. Tuesday Thursday and Saturday     levothyroxine 25 MCG tablet  Commonly known as:  SYNTHROID, LEVOTHROID  Take 25 mcg by mouth See admin instructions. 25 mcg  on Monday, Wednesday, Friday and Sunday.     metroNIDAZOLE 500  MG tablet  Commonly known as:  FLAGYL  Take 1 tablet (500 mg total) by mouth every 8 (eight) hours.     ondansetron 8 MG tablet  Commonly known as:  ZOFRAN  Take 1 tablet (8 mg total) by mouth every 8 (eight) hours as needed for nausea or vomiting.     Rivaroxaban 15 MG Tabs tablet  Commonly known as:  XARELTO  Take 1 tablet (15 mg total) by mouth 2 (two) times daily with a meal.     rivaroxaban 20 MG Tabs tablet  Commonly known as:  XARELTO  Take 1 tablet (20 mg total) by mouth daily with supper.  Start taking on:  12/21/2013       Allergies  Allergen Reactions  . Cataflam [Diclofenac] Other (See Comments)    Unknown; patient is unaware of allergy.  . Cephalosporins Shortness Of Breath  . Erythromycin Shortness Of Breath  . Keflex  [Cephalexin] Shortness Of Breath  . Codeine Nausea And Vomiting  . Darvocet [Propoxyphene N-Acetaminophen] Other (See Comments)    Unknown; patient is unaware of allergy  . Naproxen Other (See Comments)    Unknown; patient is unaware of allergy  . Penicillins Other (See Comments)    Patient states that she CAN take this; unaware of allergy  . Synalgos-Dc [Aspirin-Caff-Dihydrocodeine] Other (See Comments)    Unknown; patient is unaware of allergy  . Ultram [Tramadol] Other (See Comments)    Unknown; patient is unaware of allergy   Follow-up Information   Follow up with [name], MD. Schedule an appointment as soon as possible for a visit in 1 week. [facility] followup)    Specialty:  Internal Medicine   Contact information:   [address] [place] [address] [phone]       Schedule an appointment as soon as possible for a visit with [facility], MD. (As needed)    Specialty:  Oncology   Contact information:   [place]. [place] [address] [phone]        The results of significant diagnostics from this hospitalization (including imaging, microbiology, ancillary and laboratory) are listed below for reference.    Significant Diagnostic Studies: Dg Hip Complete Left  11/30/2013   CLINICAL DATA:  Imbalance, fell in bathroom landing on LEFT leg, LEFT hip pain radiating to LEFT knee  EXAM: LEFT HIP - COMPLETE 2+ VIEW  COMPARISON:  CT pelvis 11/28/2013  FINDINGS: Diffuse osseous demineralization.  Minimal narrowing of the hip joints bilaterally.  SI joints symmetric.  No acute fracture, dislocation or bone destruction.  Soft tissues unremarkable.  IMPRESSION: No acute osseous abnormalities.   Electronically Signed   By: Lavonia Dana M.D.   On: 11/30/2013 10:11   Dg Femur Left  11/30/2013   CLINICAL DATA:  Status post fall in bathroom following losing her balance now the patient complains of left hip pain radiating into the knee  EXAM: LEFT FEMUR - 2  VIEW  COMPARISON:  Left hip series of today's date.  FINDINGS: The femur is adequately mineralized for age. There is no acute fracture nor dislocation. The observed portions of the left knee are unremarkable. The femoral head and neck and intertrochanteric region are evaluated on the accompanying left hip series.  IMPRESSION: There is no acute bony abnormality of the visualized portions of the left femur.   Electronically Signed   By: David  Martinique   On: 11/30/2013 09:54   Ct Head Wo Contrast  11/28/2013   CLINICAL DATA:  Patient found unresponsive at home today.  EXAM: CT HEAD WITHOUT CONTRAST  CT CERVICAL SPINE WITHOUT CONTRAST  TECHNIQUE: Multidetector CT imaging of the head and cervical spine was performed following the standard protocol without intravenous contrast. Multiplanar CT image reconstructions of the cervical spine were also generated.  COMPARISON:  Head and cervical spine CT scans 03/11/2007. Head CT scan 11/11/2009.  FINDINGS: CT HEAD FINDINGS  Chronic microvascular ischemic change and mild atrophy are noted. There is no evidence of acute intracranial abnormality including hemorrhage, infarct, mass lesion, mass effect, midline shift or abnormal extra-axial fluid collection. There is no hydrocephalus or pneumocephalus. The calvarium is intact.  CT CERVICAL SPINE FINDINGS  No fracture or malalignment of the cervical spine is identified. Loss of disc space height is most notable at C5-6 and C6-7. Lung apices are clear.  IMPRESSION: No acute finding head or cervical spine.  Chronic microvascular ischemic change and mild atrophy.  Mild appearing cervical spondylosis.   Electronically Signed   By: Inge Rise M.D.   On: 11/28/2013 20:58   Ct Angio Chest Pe W/cm &/or Wo Cm  11/28/2013   CLINICAL DATA:  Shortness of breath,, history of colorectal cancer, IVC feeding chemo and radiation, hypoxia history of pulmonary embolus  EXAM: CT ANGIOGRAPHY CHEST WITH CONTRAST  TECHNIQUE: Multidetector CT  imaging of the chest was performed using the standard protocol during bolus administration of intravenous contrast. Multiplanar CT image reconstructions and MIPs were obtained to evaluate the vascular anatomy.  CONTRAST:  167mL OMNIPAQUE IOHEXOL 350 MG/ML SOLN  COMPARISON:  09/22/2013  FINDINGS: Sagittal images of the spine shows osteopenia and degenerative changes thoracic spine. Air is noted within esophagus. There is some fluid in lower esophagus probable from gastroesophageal reflux.  There is no mediastinal hematoma or adenopathy. Heart size within normal limits.  Axial image 32 pulmonary emboli are noted into segmental branches right upper lobe.  There is pulmonary embolus in right main pulmonary artery at the takeoff of right lower lobe branch. Best seen in axial made 51. Pulmonary embolus is noted in left upper lobe segmental branch axial image 40. At least 1 pulmonary embolus is noted segmental branch in left lower lobe axial image 54. Findings are consistent with bilateral PE. There is bilateral lower lobe posterior atelectasis. Right ventricle measures 3.9 cm. Left ventricle measures 3.6 cm in diameter. Right ventricle over left ventricle room ratio measures 1.08  Review of the MIP images confirms the above findings.  IMPRESSION: 1. Bilateral pulmonary emboli as described above. Positive for acute PE with CT evidence of right heart strain (RV/LV Ratio = 1.08) consistent with at least submassive (intermediate risk) PE. The presence of right heart strain has been associated with an increased risk of morbidity and mortality. Consultation with Pulmonary and Critical Care Medicine is recommended.  2. Degenerative changes thoracic spine. 3. Small amount of air is noted in upper esophagus. Fluid is noted in distal esophagus probable from gastroesophageal reflux. 4. Bilateral lower lobe posterior atelectasis. 5. No pulmonary edema.   Electronically Signed   By: Lahoma Crocker M.D.   On: 11/28/2013 21:09   Ct  Cervical Spine Wo Contrast  11/28/2013   CLINICAL DATA:  Patient found unresponsive at home today.  EXAM: CT HEAD WITHOUT CONTRAST  CT CERVICAL SPINE WITHOUT CONTRAST  TECHNIQUE: Multidetector CT imaging of the head and cervical spine was performed following the standard protocol without intravenous contrast. Multiplanar CT image reconstructions of the cervical spine were also generated.  COMPARISON:  Head  and cervical spine CT scans 03/11/2007. Head CT scan 11/11/2009.  FINDINGS: CT HEAD FINDINGS  Chronic microvascular ischemic change and mild atrophy are noted. There is no evidence of acute intracranial abnormality including hemorrhage, infarct, mass lesion, mass effect, midline shift or abnormal extra-axial fluid collection. There is no hydrocephalus or pneumocephalus. The calvarium is intact.  CT CERVICAL SPINE FINDINGS  No fracture or malalignment of the cervical spine is identified. Loss of disc space height is most notable at C5-6 and C6-7. Lung apices are clear.  IMPRESSION: No acute finding head or cervical spine.  Chronic microvascular ischemic change and mild atrophy.  Mild appearing cervical spondylosis.   Electronically Signed   By: Inge Rise M.D.   On: 11/28/2013 20:58   Ct Abdomen Pelvis W Contrast  11/28/2013   CLINICAL DATA:  Rectal cancer and abdominal pain.  EXAM: CT ABDOMEN AND PELVIS WITH CONTRAST  TECHNIQUE: Multidetector CT imaging of the abdomen and pelvis was performed using the standard protocol following bolus administration of intravenous contrast.  CONTRAST:  134mL OMNIPAQUE IOHEXOL 350 MG/ML SOLN  COMPARISON:  10/24/2013  FINDINGS: Since the prior study, the patient has developed diffuse wall thickening and edema involving the colon as well as several loops of distal small bowel. There is associated free fluid scattered in the mesenteries and in the dependent pelvis. There is no evidence of bowel perforation or overt bowel obstruction and findings likely reflect colitis and  enteritis. Component of ischemia cannot be excluded, but the major mesenteric arteries appear patent. There is no evidence of mesenteric venous or portal venous thrombosis. No focal abscess is identified.  Rectal thickening a again noted consistent with known rectal cancer. Stable partially calcified right pelvic mass. No visible enlarged lymph nodes.  The liver, gallbladder, pancreas, spleen, adrenal glands and kidneys are unremarkable. Bilateral parapelvic renal cysts are again noted without hydronephrosis.  The bladder is decompressed by a Foley catheter.  IMPRESSION: Development of diffuse wall thickening and edema involving the colon as well as distal small bowel. Findings are consistent with colitis/enteritis. No associated overt bowel obstruction or perforation.   Electronically Signed   By: Aletta Edouard M.D.   On: 11/28/2013 21:08   Dg Chest Portable 1 View  11/28/2013   CLINICAL DATA:  Altered mental status. Recent diagnosis of colon cancer.  EXAM: PORTABLE CHEST - 1 VIEW  COMPARISON:  Single view of the chest 04/25/2013. CT chest 09/22/2013.  FINDINGS: Lung volumes are lower than on the comparison plain film of the chest with crowding of the bronchovascular structures. No consolidative process, pneumothorax or effusion is identified. Heart size is normal.  IMPRESSION: No acute disease in a low volume chest.   Electronically Signed   By: Inge Rise M.D.   On: 11/28/2013 19:36    Microbiology: Recent Results (from the past 240 hour(s))  URINE CULTURE     Status: None   Collection Time    11/28/13  7:07 PM      Result Value Ref Range Status   Specimen Description URINE, CATHETERIZED   Final   Special Requests NONE   Final   Culture  Setup Time     Final   Value: 11/29/2013 00:54     Performed at [facility]     Final   Value: >=100,000 COLONIES/ML     Performed at [facility]   Culture     Final   Value: ESCHERICHIA COLI     Performed  at [facility]   Report Status 12/01/2013 FINAL   Final   Organism ID, Bacteria ESCHERICHIA COLI   Final  CULTURE, BLOOD (ROUTINE X 2)     Status: None   Collection Time    11/28/13  7:12 PM      Result Value Ref Range Status   Specimen Description BLOOD RIGHT ANTECUBITAL   Final   Special Requests BOTTLES DRAWN AEROBIC AND ANAEROBIC 5ML   Final   Culture  Setup Time     Final   Value: 11/29/2013 05:43     Performed at [facility]   Culture     Final   Value:        BLOOD CULTURE RECEIVED NO GROWTH TO DATE CULTURE WILL BE HELD FOR 5 DAYS BEFORE ISSUING A FINAL NEGATIVE REPORT     Performed at [facility]   Report Status PENDING   Incomplete  CULTURE, BLOOD (ROUTINE X 2)     Status: None   Collection Time    11/28/13  7:15 PM      Result Value Ref Range Status   Specimen Description BLOOD RIGHT FOREARM   Final   Special Requests BOTTLES DRAWN AEROBIC AND ANAEROBIC [facility]   Final   Culture  Setup Time     Final   Value: 11/29/2013 05:42     Performed at [facility]   Culture     Final   Value:        BLOOD CULTURE RECEIVED NO GROWTH TO DATE CULTURE WILL BE HELD FOR 5 DAYS BEFORE ISSUING A FINAL NEGATIVE REPORT     Performed at [facility]   Report Status PENDING   Incomplete  MRSA PCR SCREENING     Status: None   Collection Time    11/28/13 10:50 PM      Result Value Ref Range Status   MRSA by PCR NEGATIVE  NEGATIVE Final   Comment:            The GeneXpert MRSA Assay (FDA     approved for NASAL specimens     only), is one component of a     comprehensive MRSA colonization     surveillance program. It is not     intended to diagnose MRSA     infection nor to guide or     monitor treatment for     MRSA infections.     Performed at [facility]  URINE CULTURE     Status: None   Collection Time    11/28/13 11:00 PM      Result Value Ref Range Status   Specimen Description URINE, CATHETERIZED   Final   Special Requests NONE   Final    Culture  Setup Time     Final   Value: 11/29/2013 04:29     Performed at [facility]     Final   Value: NO GROWTH     Performed at [facility]   Culture     Final   Value: NO GROWTH     Performed at [facility]   Report Status 11/30/2013 FINAL   Final  CLOSTRIDIUM DIFFICILE BY PCR     Status: Abnormal   Collection Time    11/30/13 11:09 PM      Result Value Ref Range Status   C difficile by pcr POSITIVE (*) NEGATIVE Final   Comment: CRITICAL RESULT CALLED  TO, READ BACK BY AND VERIFIED WITH:     Baldo Daub RN 9:45 12/01/13 ([email or web address])     Performed at [facility]: Basic Metabolic Panel:  Recent Labs Lab 11/28/13 1912 11/28/13 2239 11/29/13 0520 11/30/13 0320 12/01/13 0335  NA 139 139 138 139 138  K 3.0* 2.7* 3.4* 4.2 4.2  CL 98 104 103 110 105  CO2 20 20 22 20 23   GLUCOSE 230* 145* 111* 103* 97  BUN 19 17 17 15 10   CREATININE 0.81 0.74 0.77 0.65 0.58  CALCIUM 8.7 7.6* 7.4* 7.7* 8.1*   Liver Function Tests:  Recent Labs Lab 11/28/13 1912 11/28/13 2239  AST 36 31  ALT 43* 36*  ALKPHOS 127* 100  BILITOT 1.4* 1.4*  PROT 5.5* 4.5*  ALBUMIN 2.9* 2.3*   No results found for this basename: LIPASE, AMYLASE,  in the last 168 hours No results found for this basename: AMMONIA,  in the last 168 hours CBC:  Recent Labs Lab 11/28/13 1912 11/28/13 2239 11/29/13 0520 11/30/13 0320 12/01/13 0335  WBC 4.2 5.9 8.0 5.1 5.9  NEUTROABS 3.6 5.2  --   --   --   HGB 12.6 12.1 10.2* 8.5* 7.9*  HCT 37.9 36.4 30.2* 24.7* 23.2*  MCV 91.8 92.2 90.1 91.5 90.3  PLT 166 133* 140* 114* 144*   Cardiac Enzymes:  Recent Labs Lab 11/28/13 2239 11/29/13 0520 11/29/13 1015  TROPONINI <0.30 <0.30 <0.30   BNP: BNP (last 3 results) No results found for this basename: PROBNP,  in the last 8760 hours CBG: No results found for this basename: GLUCAP,  in the last 168 hours     Signed:  Cristal Ford  [facility] 12/01/2013, 1:39 PM

## 2013-12-04 ENCOUNTER — Encounter: Payer: Self-pay | Admitting: Radiation Oncology

## 2013-12-04 ENCOUNTER — Ambulatory Visit: Payer: Medicare Other

## 2013-12-04 ENCOUNTER — Ambulatory Visit: Payer: Medicare Other | Admitting: Radiation Oncology

## 2013-12-04 ENCOUNTER — Encounter: Payer: Self-pay | Admitting: *Deleted

## 2013-12-04 ENCOUNTER — Non-Acute Institutional Stay (SKILLED_NURSING_FACILITY): Payer: Medicare Other | Admitting: Internal Medicine

## 2013-12-04 DIAGNOSIS — D539 Nutritional anemia, unspecified: Secondary | ICD-10-CM | POA: Insufficient documentation

## 2013-12-04 DIAGNOSIS — I2699 Other pulmonary embolism without acute cor pulmonale: Secondary | ICD-10-CM

## 2013-12-04 DIAGNOSIS — E038 Other specified hypothyroidism: Secondary | ICD-10-CM

## 2013-12-04 DIAGNOSIS — C20 Malignant neoplasm of rectum: Secondary | ICD-10-CM

## 2013-12-04 DIAGNOSIS — D649 Anemia, unspecified: Secondary | ICD-10-CM

## 2013-12-04 DIAGNOSIS — R21 Rash and other nonspecific skin eruption: Secondary | ICD-10-CM

## 2013-12-04 DIAGNOSIS — I10 Essential (primary) hypertension: Secondary | ICD-10-CM

## 2013-12-04 NOTE — Progress Notes (Signed)
[facility] End of Treatment Note  Name:Paytan KOOPER CHRISWELL  Date: 12/04/2013 IDC:[id number] DOB:09-01-1928   Status:outpatient    CC: [name], MD  Dr. Zola Button, Dr. Michael Boston  REFERRING PHYSICIAN:   Dr. Zola Button   DIAGNOSIS: Clinical stage T3 N0 invasive adenocarcinoma the rectum, with solitary liver lesion Intent: "Preoperative"   TREATMENT DATES: 10/25/2013 through 11/28/2013                          SITE/DOSE:    Pelvis 4500 cGy in 25 sessions, no rectal boost                        BEAMS/ENERGY:  4 field technique with mixed 10 MV/15 MV photons                 NARRATIVE: Ms. Sagun initially tolerated her treatment reasonably well except for loose bowels. This was attributed to her radiation therapy and Xeloda. After completion of her whole pelvic radiotherapy, and before a planned boost she rapidly deteriorated and was admitted to the hospital for evaluation and management of pulmonary emboli and C. difficile. She was discharged to rehabilitation. I decided not to proceed with her planned rectal boost.                           PLAN: Routine followup in one month. Patient instructed to call if questions or worsening complaints in interim. She will undergo restaging of her liver and see Dr. Michael Boston to see if she would still be a surgical candidate.

## 2013-12-04 NOTE — Progress Notes (Signed)
Patient ID: Bethany [name], female   DOB: 09/13/1928, 80 y.o.   MRN: [id number]  this is an acute visit.  Level care skilled.  [name]   Chief Complaint  Patient presents with  . Acute visit status post hospitalization for pulmonary embolism-complicated with history of rectal cancer and sepsis-also rash noted right low back    HPI: Patient is 80 y.o. female who is admitted to SNF for weakness after throwing a PE 2/2 rectal CA and sepsis.--she is also developed a rash on her right mid lower back nursing staff would like to look at.  Currently patient is somewhat anxious and feels weak but has no acute complaints of chest pain and shortness of breath or acute changes.  Her vital signs appear to be stable-.  Regards to the rash she does not really complain of itching or pain with this  Past Medical History  Diagnosis Date  . Migraine   . GERD (gastroesophageal reflux disease)   . Hypertension   . Hypercholesteremia   . Pulmonary embolism 01/01/2012  . Hypothyroidism   . Rectal mass   . Ovarian tumor     sees dr Lisbeth Renshaw ob-gyn yearly for evaluation  . Cancer 09/29/13    rectal adenocarcinoma  . Rectal cancer 09/29/13    invasive adeocarcinoma  . Hx of radiation therapy 10/25/13-11/28/13    pelvis, 4500 cGy in 25 sessions    Past Surgical History  Procedure Laterality Date  . Ovarian cyst surgery  yrs ago  . Cataract extraction Bilateral   . Colonoscopy N/A 09/22/2013    Procedure: COLONOSCOPY; Surgeon: Beryle Beams, MD; Location: [facility]; Service: Endoscopy; Laterality: N/A;  . Eus N/A 09/29/2013    Procedure: LOWER ENDOSCOPIC ULTRASOUND (EUS); Surgeon: Beryle Beams, MD; Location: [facility]; Service: Endoscopy; Laterality: N/A;  . Abdominal hysterectomy      years ago, prolapsed uterus      Medication List                       cholecalciferol 1000 UNITS tablet  Commonly known as: VITAMIN D  Take 2,000 Units by mouth daily.     feeding supplement (RESOURCE BREEZE) Liqd  Take 1 Container by mouth 2 (two) times daily between meals.     levothyroxine 50 MCG tablet  Commonly known as: SYNTHROID, LEVOTHROID  Take 50 mcg by mouth daily before breakfast. Tuesday Thursday and Saturday     levothyroxine 25 MCG tablet  Commonly known as: SYNTHROID, LEVOTHROID  Take 25 mcg by mouth See admin instructions. 25 mcg on Monday, Wednesday, Friday and Sunday.     metroNIDAZOLE 500 MG tablet  Commonly known as: FLAGYL  Take 1 tablet (500 mg total) by mouth every 8 (eight) hours.     ondansetron 8 MG tablet  Commonly known as: ZOFRAN  Take 1 tablet (8 mg total) by mouth every 8 (eight) hours as needed for nausea or vomiting.     Rivaroxaban 15 MG Tabs tablet  Commonly known as: XARELTO  Take 1 tablet (15 mg total) by mouth 2 (two) times daily with a meal.     rivaroxaban 20 MG Tabs tablet  Commonly known as: XARELTO  Take 1 tablet (20 mg total) by mouth daily with supper.  Start taking on: 12/21/2013        No orders of the defined types were placed in this encounter.   Immunization History  Administered Date(s) Administered  . Influenza,inj,Quad PF,36+ Mos  12/01/2013    History  Substance Use Topics  . Smoking status: Never Smoker   . Smokeless tobacco: Never Used  . Alcohol Use: No    Family history is noncontributory   Review of Systems  DATA OBTAINED: from patient GENERAL: no fevers,+ fatigue, appetite changes SKIN: No itching  has a , rash on back-- or wounds EYES: No eye pain, redness, discharge EARS: No earache, tinnitus, change in hearing NOSE: No congestion, drainage or bleeding  MOUTH/THROAT: No mouth or tooth pain, No sore throat RESPIRATORY: No cough, wheezing, SOB CARDIAC: No chest pain, palpitations, lower  extremity edema  GI: No abdominal pain, No N/V/D or constipation, No heartburn or reflux  GU: No dysuria, frequency or urgency, or incontinence  MUSCULOSKELETAL: No unrelieved bone/joint painhas some stiffness left side left leg NEUROLOGIC: No headache, dizziness  PSYCHIATRIC: No overt anxiety or sadness, No behavior issue.                       Physical Exam Temperature 98.5 pulse 78 respirations 18 blood pressure 128/69 GENERAL APPEARANCE: Alert, conversant, No acute distressappears weak.  SKIN: No diaphoresishas a small linear rash right mid low back-appears may have some adjoining vesicles-; HEAD: Normocephalic, atraumatic  EYES: Conjunctiva/lids clear. Pupils round, reactive. EOMs intact.  EARS: External exam WNL, canals clear. Hearing grossly normal.  NOSE: No deformity or discharge.  MOUTH/THROAT: Lips w/o lesions  RESPIRATORY: Breathing is even, unlabored. Lung sounds are clear  CARDIOVASCULAR: Heart RRR no murmurs, rubs or gallops. No peripheral edema.  GASTROINTESTINAL: Abdomen is soft, non-tender, not distended w/ normal bowel sounds. GENITOURINARY: Bladder non tender, not distended  MUSCULOSKELETAL: No abnormal joints or musculature---does have stiffness especially of her left lower extremity NEUROLOGIC: Cranial nerves 2-12 grossly intact. Moves all extremities  PSYCHIATRIC: Mood and affect appropriate to situation, no behavioral issues--somewhat anxious at times  Patient Active Problem List   Diagnosis Date Noted  . Acute encephalopathy 12/08/2013  . Clostridium difficile colitis 12/08/2013  . Atrial fibrillation with RVR 12/08/2013  . Anemia in neoplastic disease 12/08/2013  . Anemia 12/04/2013  . Rash and nonspecific skin eruption 12/04/2013  . Protein-calorie malnutrition, severe 11/29/2013  . Severe sepsis with septic shock 11/28/2013  . Hypokalemia 11/28/2013  . Hypothyroidism 11/28/2013  . Rectal  cancer 10/11/2013  . Adnexal mass 04/14/2012  . Cor pulmonale, acute 01/02/2012  . Pulmonary embolism 01/01/2012  . Gastroesophageal reflux disease 01/01/2012  . Hypertension 01/01/2012  . Dyslipidemia 01/01/2012    CBC  Labs (Brief)       Component Value Date/Time   WBC 5.9 12/01/2013 0335   WBC 4.5 11/23/2013 1425   RBC 2.57* 12/01/2013 0335   RBC 3.62* 11/23/2013 1425   HGB 7.9* 12/01/2013 0335   HGB 11.0* 11/23/2013 1425   HCT 23.2* 12/01/2013 0335   HCT 33.4* 11/23/2013 1425   PLT 144* 12/01/2013 0335   PLT 152 11/23/2013 1425   MCV 90.3 12/01/2013 0335   MCV 92.5 11/23/2013 1425   LYMPHSABS 0.2* 11/28/2013 2239   LYMPHSABS 0.2* 11/23/2013 1425   MONOABS 0.6 11/28/2013 2239   MONOABS 0.5 11/23/2013 1425   EOSABS 0.0 11/28/2013 2239   EOSABS 0.1 11/23/2013 1425   BASOSABS 0.0 11/28/2013 2239   BASOSABS 0.0 11/23/2013 1425      CMP  Labs (Brief)       Component Value Date/Time   NA 138 12/01/2013 0335   NA 140 11/23/2013 1426   K 4.2 12/01/2013 0335  K 4.0 11/23/2013 1426   CL 105 12/01/2013 0335   CO2 23 12/01/2013 0335   CO2 28 11/23/2013 1426   GLUCOSE 97 12/01/2013 0335   GLUCOSE 103 11/23/2013 1426   BUN 10 12/01/2013 0335   BUN 16.5 11/23/2013 1426   CREATININE 0.58 12/01/2013 0335   CREATININE 0.7 11/23/2013 1426   CALCIUM 8.1* 12/01/2013 0335   CALCIUM 9.6 11/23/2013 1426   PROT 4.5* 11/28/2013 2239   PROT 5.6* 11/23/2013 1426   ALBUMIN 2.3* 11/28/2013 2239   ALBUMIN 3.2* 11/23/2013 1426   AST 31 11/28/2013 2239   AST 32 11/23/2013 1426   ALT 36* 11/28/2013 2239   ALT 52 11/23/2013 1426   ALKPHOS 100 11/28/2013 2239   ALKPHOS 110 11/23/2013 1426   BILITOT 1.4* 11/28/2013 2239   BILITOT 1.01 11/23/2013 1426   GFRNONAA  83* 12/01/2013 0335   GFRAA >90 12/01/2013 0335      Assessment and Plan  Rectal cancer Dx 09/2013, currently in XRT and was on xeloda starting 9/16;probable liver mets per hosp MRI;chemo being held for the moment  Pulmonary embolism CTA chest: Bilateral pulmonary emboli, evidence of right heart strain consistent with at least a submassive PE  -Patient has history of pulmonary embolism in 2013 was taken off her Coumadin in 2014 do to interactions with chemotherapy  -Spoke with her oncologist, Dr. Alen Blew, who agreed with starting patient on Xarelto  -Xarelto to begin today10/22/2015 per pharmacy and discontinue heparin drip --at this point appears stable.  Monitor with vital signs pulse ox every shift   Acute encephalopathy Likely multifactorial including sepsis versus respiratory failure due to pulmonary embolism  -Resolved, patient is at baseline according to family at bedside    Severe sepsis with septic shock Present at admission, patients hypothermic, hypotensive, tachycardic, tachypneic with lactic acid level for 5  -Appears to be improving, patient's vitals have now stabilized  -Possibly secondary to questionable colitis, which was initially thought to be due to chemotherapy  -UA: Due to 2 WBC, negative leukocytes or nitrites  -Chest x-ray showed no acute disease  -Repeat lactic acid 0.7  -Was placed on empiric vanc and zosyn and IVF, which were discontinued   Clostridium difficile colitis C. Diff PCR positive  -Noted on CT of the abdomen: Diffuse wall thickening and edema involving the colon as well as distal small bowel, findings consistent with colitis/enteritis  -Patient was initially started on vancomycin and Zosyn empirically however these were discontinued  -Patient currently afebrile with no leukocytosis   -Patient started on flagyl 500mg  TID for 14 days   Atrial fibrillation with RVR Evident during admission, patient was started on a  diltiazem drip which was weaned off at admission due to hypotension  -Likely secondary to pulmonary embolism with right heart strain  -Patient has converted to inus rhythm  -Troponins cycled and negative    Anemia in neoplastic disease -drop in Hb likely due to dilutional component  -monitor CBC --update tomorrow  -Currently hemodynamically stable    Hypothyroidism Continue Synthroid  -TSH 1.7    Back rash-question herpes zoster-will treat empirically with Valtrex thousand milligrams every day hours for 7 days and monitor this-this was discussed with Dr. Dellia Nims via phone.  Of note in addition to the CBC will obtain a basic metabolic panel for baseline values tomorrow as well.  [id number] note greater than 40 minutes spent assessing patient-reviewing her chart-and coordinating in formulating a plan of care for numerous diagnoses-of note greater than 50% of time spent  coordinating plan of care

## 2013-12-05 ENCOUNTER — Ambulatory Visit: Payer: Medicare Other

## 2013-12-05 LAB — CULTURE, BLOOD (ROUTINE X 2)
Culture: NO GROWTH
Culture: NO GROWTH

## 2013-12-06 ENCOUNTER — Ambulatory Visit: Payer: Medicare Other

## 2013-12-06 ENCOUNTER — Non-Acute Institutional Stay (SKILLED_NURSING_FACILITY): Payer: Medicare Other | Admitting: Internal Medicine

## 2013-12-06 DIAGNOSIS — E038 Other specified hypothyroidism: Secondary | ICD-10-CM

## 2013-12-06 DIAGNOSIS — A0472 Enterocolitis due to Clostridium difficile, not specified as recurrent: Secondary | ICD-10-CM

## 2013-12-06 DIAGNOSIS — I4891 Unspecified atrial fibrillation: Secondary | ICD-10-CM

## 2013-12-06 DIAGNOSIS — D630 Anemia in neoplastic disease: Secondary | ICD-10-CM

## 2013-12-06 DIAGNOSIS — E034 Atrophy of thyroid (acquired): Secondary | ICD-10-CM

## 2013-12-06 DIAGNOSIS — A047 Enterocolitis due to Clostridium difficile: Secondary | ICD-10-CM

## 2013-12-06 DIAGNOSIS — A419 Sepsis, unspecified organism: Secondary | ICD-10-CM

## 2013-12-06 DIAGNOSIS — I2699 Other pulmonary embolism without acute cor pulmonale: Secondary | ICD-10-CM

## 2013-12-06 DIAGNOSIS — R6521 Severe sepsis with septic shock: Secondary | ICD-10-CM

## 2013-12-06 DIAGNOSIS — G9340 Encephalopathy, unspecified: Secondary | ICD-10-CM

## 2013-12-06 DIAGNOSIS — C20 Malignant neoplasm of rectum: Secondary | ICD-10-CM

## 2013-12-06 NOTE — Progress Notes (Signed)
MRN: [id number] Name: Bethany [name]  Sex: female Age: 80 y.o. DOB: Jun 05, 1928  [place] #: [facility] Facility/Room: 105 Level Of Care: SNF Provider: Inocencio Homes D Emergency Contacts: Extended Emergency Contact Information Primary Emergency Contact: Shepherd,[name] Address: [address]          [place], [place] [address] Johnnette Litter of [place] Phone: [phone] Mobile Phone: [phone] Relation: Nephew  Code Status: DNR  Allergies: Cataflam; Cephalosporins; Erythromycin; Keflex; Codeine; Darvocet; Naproxen; Penicillins; Synalgos-dc; and Ultram  Chief Complaint  Patient presents with  . New Admit To SNF    HPI: Patient is 80 y.o. female who is admitted to SNF for weakness after throwing a PE 2/2 rectal CA and sepsis.  Past Medical History  Diagnosis Date  . Migraine   . GERD (gastroesophageal reflux disease)   . Hypertension   . Hypercholesteremia   . Pulmonary embolism 01/01/2012  . Hypothyroidism   . Rectal mass   . Ovarian tumor     sees dr Lisbeth Renshaw ob-gyn yearly for evaluation  . Cancer 09/29/13    rectal adenocarcinoma  . Rectal cancer 09/29/13    invasive adeocarcinoma  . Hx of radiation therapy 10/25/13-11/28/13    pelvis, 4500 cGy in 25 sessions    Past Surgical History  Procedure Laterality Date  . Ovarian cyst surgery  yrs ago  . Cataract extraction Bilateral   . Colonoscopy N/A 09/22/2013    Procedure: COLONOSCOPY;  Surgeon: Beryle Beams, MD;  Location: [facility];  Service: Endoscopy;  Laterality: N/A;  . Eus N/A 09/29/2013    Procedure: LOWER ENDOSCOPIC ULTRASOUND (EUS);  Surgeon: Beryle Beams, MD;  Location: [facility];  Service: Endoscopy;  Laterality: N/A;  . Abdominal hysterectomy      years ago, prolapsed uterus      Medication List       This list is accurate as of: 12/06/13 11:59 PM.  Always use your most recent med list.               cholecalciferol 1000 UNITS tablet  Commonly known as:  VITAMIN D  Take 2,000  Units by mouth daily.     feeding supplement (RESOURCE BREEZE) Liqd  Take 1 Container by mouth 2 (two) times daily between meals.     levothyroxine 50 MCG tablet  Commonly known as:  SYNTHROID, LEVOTHROID  Take 50 mcg by mouth daily before breakfast. Tuesday Thursday and Saturday     levothyroxine 25 MCG tablet  Commonly known as:  SYNTHROID, LEVOTHROID  Take 25 mcg by mouth See admin instructions. 25 mcg  on Monday, Wednesday, Friday and Sunday.     metroNIDAZOLE 500 MG tablet  Commonly known as:  FLAGYL  Take 1 tablet (500 mg total) by mouth every 8 (eight) hours.     ondansetron 8 MG tablet  Commonly known as:  ZOFRAN  Take 1 tablet (8 mg total) by mouth every 8 (eight) hours as needed for nausea or vomiting.     Rivaroxaban 15 MG Tabs tablet  Commonly known as:  XARELTO  Take 1 tablet (15 mg total) by mouth 2 (two) times daily with a meal.     rivaroxaban 20 MG Tabs tablet  Commonly known as:  XARELTO  Take 1 tablet (20 mg total) by mouth daily with supper.  Start taking on:  12/21/2013        No orders of the defined types were placed in this encounter.    Immunization History  Administered Date(s) Administered  .  Influenza,inj,Quad PF,36+ Mos 12/01/2013    History  Substance Use Topics  . Smoking status: Never Smoker   . Smokeless tobacco: Never Used  . Alcohol Use: No    Family history is noncontributory    Review of Systems  DATA OBTAINED: from patient GENERAL:  no fevers,+ fatigue, appetite changes SKIN: No itching, rash or wounds EYES: No eye pain, redness, discharge EARS: No earache, tinnitus, change in hearing NOSE: No congestion, drainage or bleeding  MOUTH/THROAT: No mouth or tooth pain, No sore throat RESPIRATORY: No cough, wheezing, SOB CARDIAC: No chest pain, palpitations, lower extremity edema  GI: No abdominal pain, No N/V/D or constipation, No heartburn or reflux  GU: No dysuria, frequency or urgency, or incontinence  MUSCULOSKELETAL:  No unrelieved bone/joint pain NEUROLOGIC: No headache, dizziness  PSYCHIATRIC: No overt anxiety or sadness, No behavior issue.   Filed Vitals:   12/08/13 1602  BP: 100/58  Pulse: 77  Temp: 98 F (36.7 C)  Resp: 20    Physical Exam  GENERAL APPEARANCE: Alert, conversant,  No acute distress.  SKIN: No diaphoresis rash; + pallor HEAD: Normocephalic, atraumatic  EYES: Conjunctiva/lids clear. Pupils round, reactive. EOMs intact.  EARS: External exam WNL, canals clear. Hearing grossly normal.  NOSE: No deformity or discharge.  MOUTH/THROAT: Lips w/o lesions  RESPIRATORY: Breathing is even, unlabored. Lung sounds are clear   CARDIOVASCULAR: Heart RRR no murmurs, rubs or gallops. No peripheral edema.   GASTROINTESTINAL: Abdomen is soft, non-tender, not distended w/ normal bowel sounds. GENITOURINARY: Bladder non tender, not distended  MUSCULOSKELETAL: No abnormal joints or musculature NEUROLOGIC:  Cranial nerves 2-12 grossly intact. Moves all extremities  PSYCHIATRIC: Mood and affect appropriate to situation, no behavioral issues  Patient Active Problem List   Diagnosis Date Noted  . Acute encephalopathy 12/08/2013  . Clostridium difficile colitis 12/08/2013  . Atrial fibrillation with RVR 12/08/2013  . Anemia in neoplastic disease 12/08/2013  . Anemia 12/04/2013  . Rash and nonspecific skin eruption 12/04/2013  . Protein-calorie malnutrition, severe 11/29/2013  . Severe sepsis with septic shock 11/28/2013  . Hypokalemia 11/28/2013  . Hypothyroidism 11/28/2013  . Rectal cancer 10/11/2013  . Adnexal mass 04/14/2012  . Cor pulmonale, acute 01/02/2012  . Pulmonary embolism 01/01/2012  . Gastroesophageal reflux disease 01/01/2012  . Hypertension 01/01/2012  . Dyslipidemia 01/01/2012    CBC    Component Value Date/Time   WBC 5.9 12/01/2013 0335   WBC 4.5 11/23/2013 1425   RBC 2.57* 12/01/2013 0335   RBC 3.62* 11/23/2013 1425   HGB 7.9* 12/01/2013 0335   HGB 11.0*  11/23/2013 1425   HCT 23.2* 12/01/2013 0335   HCT 33.4* 11/23/2013 1425   PLT 144* 12/01/2013 0335   PLT 152 11/23/2013 1425   MCV 90.3 12/01/2013 0335   MCV 92.5 11/23/2013 1425   LYMPHSABS 0.2* 11/28/2013 2239   LYMPHSABS 0.2* 11/23/2013 1425   MONOABS 0.6 11/28/2013 2239   MONOABS 0.5 11/23/2013 1425   EOSABS 0.0 11/28/2013 2239   EOSABS 0.1 11/23/2013 1425   BASOSABS 0.0 11/28/2013 2239   BASOSABS 0.0 11/23/2013 1425    CMP     Component Value Date/Time   NA 138 12/01/2013 0335   NA 140 11/23/2013 1426   K 4.2 12/01/2013 0335   K 4.0 11/23/2013 1426   CL 105 12/01/2013 0335   CO2 23 12/01/2013 0335   CO2 28 11/23/2013 1426   GLUCOSE 97 12/01/2013 0335   GLUCOSE 103 11/23/2013 1426   BUN 10 12/01/2013 0335  BUN 16.5 11/23/2013 1426   CREATININE 0.58 12/01/2013 0335   CREATININE 0.7 11/23/2013 1426   CALCIUM 8.1* 12/01/2013 0335   CALCIUM 9.6 11/23/2013 1426   PROT 4.5* 11/28/2013 2239   PROT 5.6* 11/23/2013 1426   ALBUMIN 2.3* 11/28/2013 2239   ALBUMIN 3.2* 11/23/2013 1426   AST 31 11/28/2013 2239   AST 32 11/23/2013 1426   ALT 36* 11/28/2013 2239   ALT 52 11/23/2013 1426   ALKPHOS 100 11/28/2013 2239   ALKPHOS 110 11/23/2013 1426   BILITOT 1.4* 11/28/2013 2239   BILITOT 1.01 11/23/2013 1426   GFRNONAA 83* 12/01/2013 0335   GFRAA >90 12/01/2013 0335    Assessment and Plan  Rectal cancer Dx 09/2013, currently in XRT and was on xeloda starting 9/16;probable liver mets per hosp MRI;chemo being held for the moment  Pulmonary embolism CTA chest: Bilateral pulmonary emboli, evidence of right heart strain consistent with at least a submassive PE  -Patient has history of pulmonary embolism in 2013 was taken off her Coumadin in 2014 do to interactions with chemotherapy  -Spoke with her oncologist, Dr. Alen Blew, who agreed with starting patient on Xarelto  -Xarelto to begin today10/22/2015 per pharmacy and discontinue heparin drip    Acute encephalopathy Likely  multifactorial including sepsis versus respiratory failure due to pulmonary embolism  -Resolved, patient is at baseline according to family at bedside    Severe sepsis with septic shock Present at admission, patients hypothermic, hypotensive, tachycardic, tachypneic with lactic acid level for 5  -Appears to be improving, patient's vitals have now stabilized  -Possibly secondary to questionable colitis, which was initially thought to be due to chemotherapy  -UA: Due to 2 WBC, negative leukocytes or nitrites  -Chest x-ray showed no acute disease  -Repeat lactic acid 0.7  -Was placed on empiric vanc and zosyn and IVF, which were discontinued   Clostridium difficile colitis C. Diff PCR positive  -Noted on CT of the abdomen: Diffuse wall thickening and edema involving the colon as well as distal small bowel, findings consistent with colitis/enteritis  -Patient was initially started on vancomycin and Zosyn empirically however these were discontinued  -Patient currently afebrile with no leukocytosis  -Patient has had one episode of loose bowel movement in the past 24 hours  -Patient started on flagyl 500mg  TID for 14 days   Atrial fibrillation with RVR Evident during admission, patient was started on a diltiazem drip which was weaned off at admission due to hypotension  -Likely secondary to pulmonary embolism with right heart strain  -Patient has converted to inus rhythm  -Troponins cycled and negative    Anemia in neoplastic disease -drop in Hb likely due to dilutional component  -monitor CBC in one week  -Currently hemodynamically stable    Hypothyroidism Continue Synthroid  -TSH 1.7      Hennie Duos, MD

## 2013-12-08 ENCOUNTER — Encounter: Payer: Self-pay | Admitting: Internal Medicine

## 2013-12-08 DIAGNOSIS — I4891 Unspecified atrial fibrillation: Secondary | ICD-10-CM | POA: Insufficient documentation

## 2013-12-08 DIAGNOSIS — A0472 Enterocolitis due to Clostridium difficile, not specified as recurrent: Secondary | ICD-10-CM

## 2013-12-08 DIAGNOSIS — G9340 Encephalopathy, unspecified: Secondary | ICD-10-CM

## 2013-12-08 DIAGNOSIS — D630 Anemia in neoplastic disease: Secondary | ICD-10-CM | POA: Insufficient documentation

## 2013-12-08 HISTORY — DX: Encephalopathy, unspecified: G93.40

## 2013-12-08 HISTORY — DX: Enterocolitis due to Clostridium difficile, not specified as recurrent: A04.72

## 2013-12-08 NOTE — Assessment & Plan Note (Signed)
Likely multifactorial including sepsis versus respiratory failure due to pulmonary embolism  -Resolved, patient is at baseline according to family at bedside

## 2013-12-08 NOTE — Assessment & Plan Note (Signed)
CTA chest: Bilateral pulmonary emboli, evidence of right heart strain consistent with at least a submassive PE  -Patient has history of pulmonary embolism in 2013 was taken off her Coumadin in 2014 do to interactions with chemotherapy  -Spoke with her oncologist, Dr. Alen Blew, who agreed with starting patient on Xarelto  -Xarelto to begin today10/22/2015 per pharmacy and discontinue heparin drip

## 2013-12-08 NOTE — Assessment & Plan Note (Signed)
C. Diff PCR positive  -Noted on CT of the abdomen: Diffuse wall thickening and edema involving the colon as well as distal small bowel, findings consistent with colitis/enteritis  -Patient was initially started on vancomycin and Zosyn empirically however these were discontinued  -Patient currently afebrile with no leukocytosis  -Patient has had one episode of loose bowel movement in the past 24 hours  -Patient started on flagyl 500mg  TID for 14 days

## 2013-12-08 NOTE — Assessment & Plan Note (Signed)
-  drop in Hb likely due to dilutional component  -monitor CBC in one week  -Currently hemodynamically stable

## 2013-12-08 NOTE — Assessment & Plan Note (Signed)
Evident during admission, patient was started on a diltiazem drip which was weaned off at admission due to hypotension  -Likely secondary to pulmonary embolism with right heart strain  -Patient has converted to inus rhythm  -Troponins cycled and negative

## 2013-12-08 NOTE — Assessment & Plan Note (Signed)
Present at admission, patients hypothermic, hypotensive, tachycardic, tachypneic with lactic acid level for 5  -Appears to be improving, patient's vitals have now stabilized  -Possibly secondary to questionable colitis, which was initially thought to be due to chemotherapy  -UA: Due to 2 WBC, negative leukocytes or nitrites  -Chest x-ray showed no acute disease  -Repeat lactic acid 0.7  -Was placed on empiric vanc and zosyn and IVF, which were discontinued

## 2013-12-08 NOTE — Assessment & Plan Note (Signed)
Dx 09/2013, currently in XRT and was on xeloda starting 9/16;probable liver mets per hosp MRI;chemo being held for the moment

## 2013-12-08 NOTE — Assessment & Plan Note (Signed)
Continue Synthroid  -TSH 1.7

## 2013-12-13 ENCOUNTER — Telehealth: Payer: Self-pay | Admitting: Oncology

## 2013-12-13 NOTE — Telephone Encounter (Signed)
pt called to cx appt...she is rehab...she will call to r/s once out

## 2013-12-19 ENCOUNTER — Other Ambulatory Visit: Payer: Medicare Other

## 2013-12-19 ENCOUNTER — Ambulatory Visit: Payer: Medicare Other | Admitting: Oncology

## 2014-01-02 ENCOUNTER — Ambulatory Visit: Admission: RE | Admit: 2014-01-02 | Payer: Medicare Other | Source: Ambulatory Visit | Admitting: Radiation Oncology

## 2014-01-15 ENCOUNTER — Non-Acute Institutional Stay (SKILLED_NURSING_FACILITY): Payer: Medicare Other | Admitting: Internal Medicine

## 2014-01-15 ENCOUNTER — Encounter: Payer: Self-pay | Admitting: Internal Medicine

## 2014-01-15 ENCOUNTER — Other Ambulatory Visit: Payer: Self-pay | Admitting: Oncology

## 2014-01-15 ENCOUNTER — Telehealth: Payer: Self-pay | Admitting: Oncology

## 2014-01-15 DIAGNOSIS — I4891 Unspecified atrial fibrillation: Secondary | ICD-10-CM

## 2014-01-15 DIAGNOSIS — G9340 Encephalopathy, unspecified: Secondary | ICD-10-CM

## 2014-01-15 DIAGNOSIS — A0472 Enterocolitis due to Clostridium difficile, not specified as recurrent: Secondary | ICD-10-CM

## 2014-01-15 DIAGNOSIS — C20 Malignant neoplasm of rectum: Secondary | ICD-10-CM

## 2014-01-15 DIAGNOSIS — A419 Sepsis, unspecified organism: Secondary | ICD-10-CM

## 2014-01-15 DIAGNOSIS — A047 Enterocolitis due to Clostridium difficile: Secondary | ICD-10-CM

## 2014-01-15 DIAGNOSIS — I2699 Other pulmonary embolism without acute cor pulmonale: Secondary | ICD-10-CM

## 2014-01-15 DIAGNOSIS — E034 Atrophy of thyroid (acquired): Secondary | ICD-10-CM

## 2014-01-15 DIAGNOSIS — E038 Other specified hypothyroidism: Secondary | ICD-10-CM

## 2014-01-15 DIAGNOSIS — R6521 Severe sepsis with septic shock: Secondary | ICD-10-CM

## 2014-01-15 DIAGNOSIS — D630 Anemia in neoplastic disease: Secondary | ICD-10-CM

## 2014-01-15 NOTE — Telephone Encounter (Signed)
Lft msg for pt confirming labs/ov per 12/07 POF, mailed out schedule .Marland Kitchen... KJ

## 2014-01-15 NOTE — Progress Notes (Signed)
MRN: [id number] Name: Bethany [name]  Sex: female Age: 80 y.o. DOB: [birth year]  [facility] #: [facility] Facility/Room: 105 Level Of Care: SNF Provider: Inocencio Homes D Emergency Contacts: Extended Emergency Contact Information Primary Emergency Contact: Shepherd,[name] Address: [address]          [place], [place] [address] Johnnette Litter of [place] Phone: [phone] Mobile Phone: [phone] Relation: Nephew  Code Status: DNR  Allergies: Cataflam; Cephalosporins; Erythromycin; Keflex; Codeine; Darvocet; Naproxen; Penicillins; Synalgos-dc; and Ultram  Chief Complaint  Patient presents with  . Discharge Note    HPI: Patient is 80 y.o. female who was admitted to SNF for generalized weakness 2/2 to hospitalization for rectal CA, sepsis and PE who is now ready to be discharged to home.  Past Medical History  Diagnosis Date  . Migraine   . GERD (gastroesophageal reflux disease)   . Hypertension   . Hypercholesteremia   . Pulmonary embolism 01/01/2012  . Hypothyroidism   . Rectal mass   . Ovarian tumor     sees dr Lisbeth Renshaw ob-gyn yearly for evaluation  . Cancer 09/29/13    rectal adenocarcinoma  . Rectal cancer 09/29/13    invasive adeocarcinoma  . Hx of radiation therapy 10/25/13-11/28/13    pelvis, 4500 cGy in 25 sessions    Past Surgical History  Procedure Laterality Date  . Ovarian cyst surgery  yrs ago  . Cataract extraction Bilateral   . Colonoscopy N/A 09/22/2013    Procedure: COLONOSCOPY;  Surgeon: Beryle Beams, MD;  Location: [facility];  Service: Endoscopy;  Laterality: N/A;  . Eus N/A 09/29/2013    Procedure: LOWER ENDOSCOPIC ULTRASOUND (EUS);  Surgeon: Beryle Beams, MD;  Location: [facility];  Service: Endoscopy;  Laterality: N/A;  . Abdominal hysterectomy      years ago, prolapsed uterus      Medication List       This list is accurate as of: 01/15/14  4:20 PM.  Always use your most recent med list.               cholecalciferol 1000  UNITS tablet  Commonly known as:  VITAMIN D  Take 2,000 Units by mouth daily.     levothyroxine 50 MCG tablet  Commonly known as:  SYNTHROID, LEVOTHROID  Take 75 mcg by mouth daily before breakfast. Tuesday Thursday and Saturday     levothyroxine 25 MCG tablet  Commonly known as:  SYNTHROID, LEVOTHROID  Take 25 mcg by mouth See admin instructions. 25 mcg  on Monday, Wednesday, Friday and Sunday.     ondansetron 8 MG tablet  Commonly known as:  ZOFRAN  Take 1 tablet (8 mg total) by mouth every 8 (eight) hours as needed for nausea or vomiting.     potassium chloride SA 20 MEQ tablet  Commonly known as:  K-DUR,KLOR-CON  Take 20 mEq by mouth daily.     rivaroxaban 20 MG Tabs tablet  Commonly known as:  XARELTO  Take 1 tablet (20 mg total) by mouth daily with supper.        Meds ordered this encounter  Medications  . potassium chloride SA (K-DUR,KLOR-CON) 20 MEQ tablet    Sig: Take 20 mEq by mouth daily.    Immunization History  Administered Date(s) Administered  . Influenza,inj,Quad PF,36+ Mos 12/01/2013    History  Substance Use Topics  . Smoking status: Never Smoker   . Smokeless tobacco: Never Used  . Alcohol Use: No    Filed Vitals:  01/15/14 1603  BP: 112/68  Pulse: 86  Temp: 96.9 F (36.1 C)  Resp: 18    Physical Exam  GENERAL APPEARANCE: Alert, No acute distress.  HEENT: Unremarkable. RESPIRATORY: Breathing is even, unlabored. Lung sounds are clear   CARDIOVASCULAR: Heart RRR no murmurs, rubs or gallops. No peripheral edema.  GASTROINTESTINAL: Abdomen is soft, non-tender, not distended w/ normal bowel sounds.  NEUROLOGIC: Cranial nerves 2-12 grossly intact  Patient Active Problem List   Diagnosis Date Noted  . Acute encephalopathy 12/08/2013  . Clostridium difficile colitis 12/08/2013  . Atrial fibrillation with RVR 12/08/2013  . Anemia in neoplastic disease 12/08/2013  . Anemia 12/04/2013  . Rash and nonspecific skin eruption 12/04/2013  .  Protein-calorie malnutrition, severe 11/29/2013  . Severe sepsis with septic shock 11/28/2013  . Hypokalemia 11/28/2013  . Hypothyroidism 11/28/2013  . Rectal cancer 10/11/2013  . Adnexal mass 04/14/2012  . Cor pulmonale, acute 01/02/2012  . Pulmonary embolism 01/01/2012  . Gastroesophageal reflux disease 01/01/2012  . Hypertension 01/01/2012  . Dyslipidemia 01/01/2012    CBC    Component Value Date/Time   WBC 5.9 12/01/2013 0335   WBC 4.5 11/23/2013 1425   RBC 2.57* 12/01/2013 0335   RBC 3.62* 11/23/2013 1425   HGB 7.9* 12/01/2013 0335   HGB 11.0* 11/23/2013 1425   HCT 23.2* 12/01/2013 0335   HCT 33.4* 11/23/2013 1425   PLT 144* 12/01/2013 0335   PLT 152 11/23/2013 1425   MCV 90.3 12/01/2013 0335   MCV 92.5 11/23/2013 1425   LYMPHSABS 0.2* 11/28/2013 2239   LYMPHSABS 0.2* 11/23/2013 1425   MONOABS 0.6 11/28/2013 2239   MONOABS 0.5 11/23/2013 1425   EOSABS 0.0 11/28/2013 2239   EOSABS 0.1 11/23/2013 1425   BASOSABS 0.0 11/28/2013 2239   BASOSABS 0.0 11/23/2013 1425    CMP     Component Value Date/Time   NA 138 12/01/2013 0335   NA 140 11/23/2013 1426   K 4.2 12/01/2013 0335   K 4.0 11/23/2013 1426   CL 105 12/01/2013 0335   CO2 23 12/01/2013 0335   CO2 28 11/23/2013 1426   GLUCOSE 97 12/01/2013 0335   GLUCOSE 103 11/23/2013 1426   BUN 10 12/01/2013 0335   BUN 16.5 11/23/2013 1426   CREATININE 0.58 12/01/2013 0335   CREATININE 0.7 11/23/2013 1426   CALCIUM 8.1* 12/01/2013 0335   CALCIUM 9.6 11/23/2013 1426   PROT 4.5* 11/28/2013 2239   PROT 5.6* 11/23/2013 1426   ALBUMIN 2.3* 11/28/2013 2239   ALBUMIN 3.2* 11/23/2013 1426   AST 31 11/28/2013 2239   AST 32 11/23/2013 1426   ALT 36* 11/28/2013 2239   ALT 52 11/23/2013 1426   ALKPHOS 100 11/28/2013 2239   ALKPHOS 110 11/23/2013 1426   BILITOT 1.4* 11/28/2013 2239   BILITOT 1.01 11/23/2013 1426   GFRNONAA 83* 12/01/2013 0335   GFRAA >90 12/01/2013 0335    Assessment and Plan  Pt is being d/c to  home with HH/PT/OT and nursing. No DMEs.  Hennie Duos, MD

## 2014-01-15 NOTE — Progress Notes (Signed)
Patient ID: Bethany [name], female   DOB: Dec 03, 1928, 80 y.o.   MRN: [id number]  This encounter was created in error - please disregard.

## 2014-01-16 ENCOUNTER — Telehealth: Payer: Self-pay | Admitting: Medical Oncology

## 2014-01-16 NOTE — Telephone Encounter (Signed)
Confirmed appt for the 12/17 (with Gerda Diss) for patient.

## 2014-01-19 ENCOUNTER — Encounter: Payer: Self-pay | Admitting: Radiation Oncology

## 2014-01-22 ENCOUNTER — Emergency Department (HOSPITAL_COMMUNITY): Payer: PRIVATE HEALTH INSURANCE

## 2014-01-22 ENCOUNTER — Encounter (HOSPITAL_COMMUNITY): Payer: Self-pay | Admitting: Emergency Medicine

## 2014-01-22 ENCOUNTER — Inpatient Hospital Stay (HOSPITAL_COMMUNITY)
Admission: EM | Admit: 2014-01-22 | Discharge: 2014-01-25 | DRG: 689 | Disposition: A | Discharge status: Skilled Nursing Facility | Payer: PRIVATE HEALTH INSURANCE | Attending: Internal Medicine | Admitting: Internal Medicine

## 2014-01-22 ENCOUNTER — Observation Stay (HOSPITAL_COMMUNITY): Payer: PRIVATE HEALTH INSURANCE

## 2014-01-22 DIAGNOSIS — Z8249 Family history of ischemic heart disease and other diseases of the circulatory system: Secondary | ICD-10-CM

## 2014-01-22 DIAGNOSIS — G43909 Migraine, unspecified, not intractable, without status migrainosus: Secondary | ICD-10-CM | POA: Diagnosis present

## 2014-01-22 DIAGNOSIS — W010XXA Fall on same level from slipping, tripping and stumbling without subsequent striking against object, initial encounter: Secondary | ICD-10-CM | POA: Diagnosis present

## 2014-01-22 DIAGNOSIS — N390 Urinary tract infection, site not specified: Secondary | ICD-10-CM | POA: Diagnosis not present

## 2014-01-22 DIAGNOSIS — I10 Essential (primary) hypertension: Secondary | ICD-10-CM | POA: Diagnosis present

## 2014-01-22 DIAGNOSIS — I4891 Unspecified atrial fibrillation: Secondary | ICD-10-CM | POA: Diagnosis present

## 2014-01-22 DIAGNOSIS — Z880 Allergy status to penicillin: Secondary | ICD-10-CM

## 2014-01-22 DIAGNOSIS — Z923 Personal history of irradiation: Secondary | ICD-10-CM

## 2014-01-22 DIAGNOSIS — Z881 Allergy status to other antibiotic agents status: Secondary | ICD-10-CM

## 2014-01-22 DIAGNOSIS — E43 Unspecified severe protein-calorie malnutrition: Secondary | ICD-10-CM | POA: Diagnosis present

## 2014-01-22 DIAGNOSIS — W19XXXA Unspecified fall, initial encounter: Secondary | ICD-10-CM

## 2014-01-22 DIAGNOSIS — D649 Anemia, unspecified: Secondary | ICD-10-CM | POA: Diagnosis present

## 2014-01-22 DIAGNOSIS — Z888 Allergy status to other drugs, medicaments and biological substances status: Secondary | ICD-10-CM

## 2014-01-22 DIAGNOSIS — Z681 Body mass index (BMI) 19 or less, adult: Secondary | ICD-10-CM

## 2014-01-22 DIAGNOSIS — B961 Klebsiella pneumoniae [K. pneumoniae] as the cause of diseases classified elsewhere: Secondary | ICD-10-CM | POA: Diagnosis present

## 2014-01-22 DIAGNOSIS — Z7901 Long term (current) use of anticoagulants: Secondary | ICD-10-CM

## 2014-01-22 DIAGNOSIS — Z9221 Personal history of antineoplastic chemotherapy: Secondary | ICD-10-CM

## 2014-01-22 DIAGNOSIS — E780 Pure hypercholesterolemia: Secondary | ICD-10-CM | POA: Diagnosis present

## 2014-01-22 DIAGNOSIS — C20 Malignant neoplasm of rectum: Secondary | ICD-10-CM | POA: Diagnosis present

## 2014-01-22 DIAGNOSIS — S2232XA Fracture of one rib, left side, initial encounter for closed fracture: Secondary | ICD-10-CM

## 2014-01-22 DIAGNOSIS — Z885 Allergy status to narcotic agent status: Secondary | ICD-10-CM

## 2014-01-22 DIAGNOSIS — R0789 Other chest pain: Secondary | ICD-10-CM

## 2014-01-22 DIAGNOSIS — S0990XA Unspecified injury of head, initial encounter: Secondary | ICD-10-CM

## 2014-01-22 DIAGNOSIS — Z86711 Personal history of pulmonary embolism: Secondary | ICD-10-CM | POA: Diagnosis present

## 2014-01-22 DIAGNOSIS — A047 Enterocolitis due to Clostridium difficile: Secondary | ICD-10-CM | POA: Diagnosis present

## 2014-01-22 DIAGNOSIS — I2699 Other pulmonary embolism without acute cor pulmonale: Secondary | ICD-10-CM | POA: Diagnosis present

## 2014-01-22 DIAGNOSIS — Z66 Do not resuscitate: Secondary | ICD-10-CM | POA: Diagnosis present

## 2014-01-22 DIAGNOSIS — A0472 Enterocolitis due to Clostridium difficile, not specified as recurrent: Secondary | ICD-10-CM | POA: Diagnosis present

## 2014-01-22 DIAGNOSIS — E039 Hypothyroidism, unspecified: Secondary | ICD-10-CM | POA: Diagnosis present

## 2014-01-22 DIAGNOSIS — E876 Hypokalemia: Secondary | ICD-10-CM | POA: Diagnosis not present

## 2014-01-22 DIAGNOSIS — K219 Gastro-esophageal reflux disease without esophagitis: Secondary | ICD-10-CM | POA: Diagnosis present

## 2014-01-22 DIAGNOSIS — Z90710 Acquired absence of both cervix and uterus: Secondary | ICD-10-CM

## 2014-01-22 DIAGNOSIS — Z79899 Other long term (current) drug therapy: Secondary | ICD-10-CM

## 2014-01-22 HISTORY — DX: Unspecified fall, initial encounter: W19.XXXA

## 2014-01-22 HISTORY — DX: Unspecified urinary incontinence: R32

## 2014-01-22 LAB — URINALYSIS, ROUTINE W REFLEX MICROSCOPIC
Bilirubin Urine: NEGATIVE
Glucose, UA: NEGATIVE mg/dL
Ketones, ur: NEGATIVE mg/dL
Nitrite: POSITIVE — AB
PROTEIN: NEGATIVE mg/dL
SPECIFIC GRAVITY, URINE: 1.015 (ref 1.005–1.030)
Urobilinogen, UA: 0.2 mg/dL (ref 0.0–1.0)
pH: 6.5 (ref 5.0–8.0)

## 2014-01-22 LAB — URINE MICROSCOPIC-ADD ON

## 2014-01-22 LAB — BASIC METABOLIC PANEL
Anion gap: 11 (ref 5–15)
BUN: 17 mg/dL (ref 6–23)
CALCIUM: 9.1 mg/dL (ref 8.4–10.5)
CO2: 28 mEq/L (ref 19–32)
Chloride: 102 mEq/L (ref 96–112)
Creatinine, Ser: 0.5 mg/dL (ref 0.50–1.10)
GFR, EST NON AFRICAN AMERICAN: 87 mL/min — AB (ref >90)
Glucose, Bld: 79 mg/dL (ref 70–99)
Potassium: 3.3 mEq/L — ABNORMAL LOW (ref 3.7–5.3)
Sodium: 141 mEq/L (ref 137–147)

## 2014-01-22 LAB — I-STAT CHEM 8, ED
BUN: 17 mg/dL (ref 6–23)
CALCIUM ION: 1.22 mmol/L (ref 1.13–1.30)
CREATININE: 0.5 mg/dL (ref 0.50–1.10)
Chloride: 101 mEq/L (ref 96–112)
GLUCOSE: 83 mg/dL (ref 70–99)
HCT: 38 % (ref 36.0–46.0)
Hemoglobin: 12.9 g/dL (ref 12.0–15.0)
POTASSIUM: 3.1 meq/L — AB (ref 3.7–5.3)
Sodium: 141 mEq/L (ref 137–147)
TCO2: 26 mmol/L (ref 0–100)

## 2014-01-22 LAB — CBC WITH DIFFERENTIAL/PLATELET
BASOS PCT: 1 % (ref 0–1)
Basophils Absolute: 0 10*3/uL (ref 0.0–0.1)
EOS ABS: 0.1 10*3/uL (ref 0.0–0.7)
EOS PCT: 2 % (ref 0–5)
HEMATOCRIT: 35.5 % — AB (ref 36.0–46.0)
Hemoglobin: 11.5 g/dL — ABNORMAL LOW (ref 12.0–15.0)
Lymphocytes Relative: 7 % — ABNORMAL LOW (ref 12–46)
Lymphs Abs: 0.4 10*3/uL — ABNORMAL LOW (ref 0.7–4.0)
MCH: 31.3 pg (ref 26.0–34.0)
MCHC: 32.4 g/dL (ref 30.0–36.0)
MCV: 96.7 fL (ref 78.0–100.0)
MONO ABS: 0.4 10*3/uL (ref 0.1–1.0)
MONOS PCT: 6 % (ref 3–12)
Neutro Abs: 5.7 10*3/uL (ref 1.7–7.7)
Neutrophils Relative %: 84 % — ABNORMAL HIGH (ref 43–77)
Platelets: 168 10*3/uL (ref 150–400)
RBC: 3.67 MIL/uL — ABNORMAL LOW (ref 3.87–5.11)
RDW: 13.7 % (ref 11.5–15.5)
WBC: 6.7 10*3/uL (ref 4.0–10.5)

## 2014-01-22 LAB — CBG MONITORING, ED: Glucose-Capillary: 75 mg/dL (ref 70–99)

## 2014-01-22 MED ORDER — CIPROFLOXACIN IN D5W 400 MG/200ML IV SOLN
400.0000 mg | Freq: Once | INTRAVENOUS | Status: AC
Start: 2014-01-22 — End: 2014-01-22
  Administered 2014-01-22: 400 mg via INTRAVENOUS
  Filled 2014-01-22: qty 200

## 2014-01-22 MED ORDER — FENTANYL CITRATE 0.05 MG/ML IJ SOLN
25.0000 ug | Freq: Once | INTRAMUSCULAR | Status: AC
  Administered 2014-01-22: 25 ug via INTRAVENOUS
  Filled 2014-01-22: qty 2

## 2014-01-22 MED ORDER — NITROFURANTOIN MONOHYD MACRO 100 MG PO CAPS: 100.0000 mg | ORAL_CAPSULE | Freq: Once | ORAL | Status: DC

## 2014-01-22 NOTE — ED Notes (Signed)
EMS reports pt tripped and fell at home around 4pm.  Pt reports existing left side numbness and weakness which caused fall.  Pt denies LOC. 4cm contusion present on left occiput.  Pt appears anxious.  A&O x4, Full ROM at this time.

## 2014-01-22 NOTE — ED Notes (Signed)
CBG = 75  RN informed of result.

## 2014-01-22 NOTE — ED Provider Notes (Signed)
CSN: [id number]     Arrival date & time 01/22/14  1754 History   First MD Initiated Contact with Patient 01/22/14 1858     Chief Complaint  Patient presents with  . Fall     (Consider location/radiation/quality/duration/timing/severity/associated sxs/prior Treatment) Patient is a 80 y.o. female presenting with fall.  Fall This is a new problem. The current episode started 3 to 5 hours ago. The problem occurs rarely. The problem has not changed since onset.Associated symptoms include chest pain and headaches. Pertinent negatives include no abdominal pain and no shortness of breath. Nothing aggravates the symptoms. Nothing relieves the symptoms. She has tried nothing for the symptoms. The treatment provided no relief.    Past Medical History  Diagnosis Date  . Migraine   . GERD (gastroesophageal reflux disease)   . Hypertension   . Hypercholesteremia   . Pulmonary embolism 01/01/2012  . Hypothyroidism   . Rectal mass   . Ovarian tumor     sees dr Lisbeth Renshaw ob-gyn yearly for evaluation  . Cancer 09/29/13    rectal adenocarcinoma  . Rectal cancer 09/29/13    invasive adeocarcinoma  . Hx of radiation therapy 10/25/13-11/28/13    pelvis, 4500 cGy in 25 sessions  . History of radiation therapy 10/25/13- 11/28/13    pelvis 4500 cGy 25 sessions, no rectal boost   Past Surgical History  Procedure Laterality Date  . Ovarian cyst surgery  yrs ago  . Cataract extraction Bilateral   . Colonoscopy N/A 09/22/2013    Procedure: COLONOSCOPY;  Surgeon: Beryle Beams, MD;  Location: [facility];  Service: Endoscopy;  Laterality: N/A;  . Eus N/A 09/29/2013    Procedure: LOWER ENDOSCOPIC ULTRASOUND (EUS);  Surgeon: Beryle Beams, MD;  Location: [facility];  Service: Endoscopy;  Laterality: N/A;  . Abdominal hysterectomy      years ago, prolapsed uterus   Family History  Problem Relation Age of Onset  . Brain cancer Mother   . Heart attack Father   . Diabetes Sister   . Heart disease Sister      History  Substance Use Topics  . Smoking status: Never Smoker   . Smokeless tobacco: Never Used  . Alcohol Use: No   OB History    No data available     Review of Systems  Constitutional: Negative for fever, chills, diaphoresis, activity change, appetite change and fatigue.  HENT: Negative for congestion, facial swelling, rhinorrhea and sore throat.   Eyes: Negative for photophobia and discharge.  Respiratory: Negative for cough, chest tightness and shortness of breath.   Cardiovascular: Positive for chest pain. Negative for palpitations and leg swelling.  Gastrointestinal: Negative for nausea, vomiting, abdominal pain and diarrhea.  Endocrine: Negative for polydipsia and polyuria.  Genitourinary: Positive for frequency. Negative for dysuria, difficulty urinating and pelvic pain.  Musculoskeletal: Negative for back pain, arthralgias, neck pain and neck stiffness.  Skin: Negative for color change and wound.  Allergic/Immunologic: Negative for immunocompromised state.  Neurological: Positive for headaches. Negative for facial asymmetry, weakness and numbness.  Hematological: Does not bruise/bleed easily.  Psychiatric/Behavioral: Negative for confusion and agitation.      Allergies  Cataflam; Cephalosporins; Erythromycin; Keflex; Codeine; Darvocet; Naproxen; Penicillins; Synalgos-dc; and Ultram  Home Medications   Prior to Admission medications   Medication Sig Start Date End Date Taking? Authorizing Provider  cholecalciferol (VITAMIN D) 1000 UNITS tablet Take 2,000 Units by mouth daily.   Yes Historical Provider, MD  levothyroxine (SYNTHROID, LEVOTHROID) 25 MCG tablet Take 25  mcg by mouth See admin instructions. 25 mcg  on Monday, Wednesday, Friday and Sunday.   Yes Historical Provider, MD  levothyroxine (SYNTHROID, LEVOTHROID) 50 MCG tablet Take 75 mcg by mouth daily before breakfast. Tuesday Thursday and Saturday   Yes Historical Provider, MD  potassium chloride SA  (K-DUR,KLOR-CON) 20 MEQ tablet Take 20 mEq by mouth daily.   Yes Historical Provider, MD  rivaroxaban (XARELTO) 20 MG TABS tablet Take 1 tablet (20 mg total) by mouth daily with supper. 12/21/13  Yes Maryann Mikhail, DO  ondansetron (ZOFRAN) 8 MG tablet Take 1 tablet (8 mg total) by mouth every 8 (eight) hours as needed for nausea or vomiting. Patient not taking: Reported on 01/22/2014 11/06/13   Maryanna Shape, NP   BP 119/76 mmHg  Pulse 78  Temp(Src) 97.8 F (36.6 C) (Oral)  Resp 18  Ht 5\' 7"  (1.702 m)  Wt 116 lb (52.617 kg)  BMI 18.16 kg/m2  SpO2 98% Physical Exam  Constitutional: She is oriented to person, place, and time. She appears well-developed and well-nourished. No distress.  HENT:  Head: Normocephalic and atraumatic.    Mouth/Throat: No oropharyngeal exudate.  Eyes: Pupils are equal, round, and reactive to light.  Neck: Normal range of motion. Neck supple.  Cardiovascular: Normal rate, regular rhythm and normal heart sounds.  Exam reveals no gallop and no friction rub.   No murmur heard. Pulmonary/Chest: Effort normal and breath sounds normal. No respiratory distress. She has no wheezes. She has no rales.    Abdominal: Soft. Bowel sounds are normal. She exhibits no distension and no mass. There is no tenderness. There is no rebound and no guarding.  Musculoskeletal: Normal range of motion. She exhibits no edema or tenderness.  Neurological: She is alert and oriented to person, place, and time.  Skin: Skin is warm and dry.  Psychiatric: She has a normal mood and affect.    ED Course  Procedures (including critical care time) Labs Review Labs Reviewed  CBC WITH DIFFERENTIAL - Abnormal; Notable for the following:    RBC 3.67 (*)    Hemoglobin 11.5 (*)    HCT 35.5 (*)    Neutrophils Relative % 84 (*)    Lymphocytes Relative 7 (*)    Lymphs Abs 0.4 (*)    All other components within normal limits  BASIC METABOLIC PANEL - Abnormal; Notable for the following:     Potassium 3.3 (*)    GFR calc non Af Amer 87 (*)    All other components within normal limits  URINALYSIS, ROUTINE W REFLEX MICROSCOPIC - Abnormal; Notable for the following:    APPearance CLOUDY (*)    Hgb urine dipstick MODERATE (*)    Nitrite POSITIVE (*)    Leukocytes, UA MODERATE (*)    All other components within normal limits  URINE MICROSCOPIC-ADD ON - Abnormal; Notable for the following:    Bacteria, UA MANY (*)    All other components within normal limits  COMPREHENSIVE METABOLIC PANEL - Abnormal; Notable for the following:    Potassium 3.6 (*)    Glucose, Bld 105 (*)    Creatinine, Ser 0.40 (*)    Total Protein 5.3 (*)    Albumin 2.9 (*)    All other components within normal limits  CBC WITH DIFFERENTIAL - Abnormal; Notable for the following:    RBC 3.52 (*)    Hemoglobin 10.9 (*)    HCT 34.0 (*)    Neutrophils Relative % 81 (*)  Lymphocytes Relative 8 (*)    Lymphs Abs 0.3 (*)    All other components within normal limits  I-STAT CHEM 8, ED - Abnormal; Notable for the following:    Potassium 3.1 (*)    All other components within normal limits  URINE CULTURE  CLOSTRIDIUM DIFFICILE BY PCR  TSH  CBG MONITORING, ED    Imaging Review Dg Ribs Unilateral W/chest Left  01/22/2014   CLINICAL DATA:  Trip and fall at 4 p.m. Left-sided numbness and weakness. Left lower rib pain.  EXAM: LEFT RIBS AND CHEST - 3+ VIEW  COMPARISON:  04/25/2013  FINDINGS: No pneumothorax or pleural effusion. Atherosclerotic aortic arch. Old healed right rib fractures. Bony demineralization. Left lower lateral rib irregularities favor nondisplaced fractures. These involve the left tenth and eleventh ribs and potentially the left seventh and ninth ribs.  IMPRESSION: 1. Nondisplaced left lower rib fractures laterally. 2. Old right rib fractures. 3. No pneumothorax or pleural effusion.   Electronically Signed   By: Sherryl Barters M.D.   On: 01/22/2014 21:22   Dg Thoracic Spine 2 View  01/22/2014    CLINICAL DATA:  Left-sided numbness and weakness after tripping and falling at home.  EXAM: THORACIC SPINE - 2 VIEW  COMPARISON:  CT scan of November 28, 2013.  FINDINGS: No acute fracture or significant spondylolisthesis is noted. Mild anterior osteophyte formation is noted throughout the thoracic spine. Diffuse osteopenia is noted.  IMPRESSION: No acute abnormality seen in the thoracic spine.   Electronically Signed   By: Sabino Dick M.D.   On: 01/22/2014 21:22   Ct Head Wo Contrast  01/22/2014   CLINICAL DATA:  Fall at home around 4 p.m. 4 cm contusion along the left occiput. Anxiety. Pre-existing left-sided numbness and weakness reportedly contributed to fall.  EXAM: CT HEAD WITHOUT CONTRAST  TECHNIQUE: Contiguous axial images were obtained from the base of the skull through the vertex without intravenous contrast.  COMPARISON:  11/28/2013  FINDINGS: The brainstem, cerebellum, cerebral peduncles, thalamus, basal ganglia, basilar cisterns, and ventricular system appear within normal limits. Periventricular white matter and corona radiata hypodensities favor chronic ischemic microvascular white matter disease. No intracranial hemorrhage, mass lesion, or acute CVA.  IMPRESSION: 1. No acute intracranial findings. 2. Periventricular white matter and corona radiata hypodensities favor chronic ischemic microvascular white matter disease.   Electronically Signed   By: Sherryl Barters M.D.   On: 01/22/2014 19:20   Ct Chest Wo Contrast  01/23/2014   CLINICAL DATA:  Fall with left-sided chest wall pain  EXAM: CT CHEST WITHOUT CONTRAST  TECHNIQUE: Multidetector CT imaging of the chest was performed following the standard protocol without IV contrast.  COMPARISON:  11/28/2013  FINDINGS: THORACIC INLET/BODY WALL:  No acute abnormality.  MEDIASTINUM:  Normal heart size. No pericardial effusion. Scattered atherosclerosis, typical for age. No acute vascular abnormality. No adenopathy.  LUNG WINDOWS:  No consolidation.  No hemothorax or pneumothorax. No suspicious pulmonary nodule (a right apical pulmonary nodule on image 13 is size stable since 2013 at least.  UPPER ABDOMEN:  No acute findings.  Peripelvic cyst on the left.  OSSEOUS:  Previously described left side of rib fractures are not clearly visualized by CT. No displaced rib fractures identified. Mild T11 vertebral body wedging which is chronic. Remote ninth lateral rib fracture.  IMPRESSION: No displaced rib fracture or intrathoracic injury. Rib fractures described on preceding radiography are not clearly visualized.   Electronically Signed   By: Gilford Silvius.D.  On: 01/23/2014 01:34   Ct Cervical Spine Wo Contrast  01/22/2014   CLINICAL DATA:  Patient tripped and fell at home. Denies loss of consciousness.  EXAM: CT CERVICAL SPINE WITHOUT CONTRAST  TECHNIQUE: Multidetector CT imaging of the cervical spine was performed without intravenous contrast. Multiplanar CT image reconstructions were also generated.  COMPARISON:  CT scan of November 28, 2013.  FINDINGS: No fracture or significant spondylolisthesis is noted. Mild degenerative disc disease is noted at C3-4, C5-6 and C6-7 with mild anterior osteophyte formation. Mild degenerative changes are seen involving the posterior facet joints bilaterally. Visualized portions of upper lung fields appear normal.  IMPRESSION: Multilevel degenerative disc disease. No acute abnormality seen in the cervical spine.   Electronically Signed   By: Sabino Dick M.D.   On: 01/22/2014 19:59     EKG Interpretation None      MDM   Final diagnoses:  Fall from standing  Left-sided chest wall pain  Left rib fracture, closed, initial encounter  UTI (lower urinary tract infection)  Closed head injury without loss of consciousness, initial encounter    Pt is a 80 y.o. female with Pmhx as above who presents with fall from standing. She is on Xarelto. Patient states she was walking down the hallway and felt like she got  tripped and fell onto her left side, striking the left side of her head.  She had no loss of consciousness.  She complains of left-sided headache, and left posterior rib and thoracic back pain.  She denies neck pain, hip or leg pain.  She has had urinary frequency and intermittent dysuria for several days.  She is otherwise been well.  She has chronic unchanged numbness and weakness in the left lower extremity, neuro exam otherwise unremarkable.  +L sided parietal contusion, +L posterolateral chest wall pain. Pt does not want pain meds at this time as she is worried about side effects.   +UTI based on U/A, Cipro ordered after speaking w/ pharmacy about allergies. CT head & c-spine negative. XR ribs with at least two, possibly 4 rib fx. Will get CT chest to further evaluate. Given M/M with rib fx at pt's age, Trauma consulted and [facility] consulted for admission for pain control, UTI tx.      Ernestina Patches, MD 01/23/14 [phone]

## 2014-01-22 NOTE — ED Notes (Signed)
Pt to x-ray at this time and CT at this time

## 2014-01-22 NOTE — ED Notes (Signed)
Patient transported to CT 

## 2014-01-23 ENCOUNTER — Encounter (HOSPITAL_COMMUNITY): Payer: Self-pay | Admitting: Internal Medicine

## 2014-01-23 ENCOUNTER — Telehealth: Payer: Self-pay | Admitting: Oncology

## 2014-01-23 ENCOUNTER — Ambulatory Visit: Admission: RE | Admit: 2014-01-23 | Payer: Medicare Other | Source: Ambulatory Visit | Admitting: Radiation Oncology

## 2014-01-23 DIAGNOSIS — N390 Urinary tract infection, site not specified: Secondary | ICD-10-CM

## 2014-01-23 DIAGNOSIS — I2699 Other pulmonary embolism without acute cor pulmonale: Secondary | ICD-10-CM

## 2014-01-23 DIAGNOSIS — G43909 Migraine, unspecified, not intractable, without status migrainosus: Secondary | ICD-10-CM | POA: Diagnosis present

## 2014-01-23 DIAGNOSIS — Z880 Allergy status to penicillin: Secondary | ICD-10-CM | POA: Diagnosis not present

## 2014-01-23 DIAGNOSIS — W010XXA Fall on same level from slipping, tripping and stumbling without subsequent striking against object, initial encounter: Secondary | ICD-10-CM | POA: Diagnosis present

## 2014-01-23 DIAGNOSIS — D649 Anemia, unspecified: Secondary | ICD-10-CM | POA: Diagnosis present

## 2014-01-23 DIAGNOSIS — W19XXXA Unspecified fall, initial encounter: Secondary | ICD-10-CM

## 2014-01-23 DIAGNOSIS — E039 Hypothyroidism, unspecified: Secondary | ICD-10-CM | POA: Diagnosis present

## 2014-01-23 DIAGNOSIS — B961 Klebsiella pneumoniae [K. pneumoniae] as the cause of diseases classified elsewhere: Secondary | ICD-10-CM | POA: Diagnosis present

## 2014-01-23 DIAGNOSIS — K219 Gastro-esophageal reflux disease without esophagitis: Secondary | ICD-10-CM | POA: Diagnosis present

## 2014-01-23 DIAGNOSIS — I4891 Unspecified atrial fibrillation: Secondary | ICD-10-CM | POA: Diagnosis present

## 2014-01-23 DIAGNOSIS — Z66 Do not resuscitate: Secondary | ICD-10-CM | POA: Diagnosis present

## 2014-01-23 DIAGNOSIS — S2232XA Fracture of one rib, left side, initial encounter for closed fracture: Secondary | ICD-10-CM

## 2014-01-23 DIAGNOSIS — Z79899 Other long term (current) drug therapy: Secondary | ICD-10-CM | POA: Diagnosis not present

## 2014-01-23 DIAGNOSIS — E43 Unspecified severe protein-calorie malnutrition: Secondary | ICD-10-CM | POA: Diagnosis present

## 2014-01-23 DIAGNOSIS — Z888 Allergy status to other drugs, medicaments and biological substances status: Secondary | ICD-10-CM | POA: Diagnosis not present

## 2014-01-23 DIAGNOSIS — A047 Enterocolitis due to Clostridium difficile: Secondary | ICD-10-CM | POA: Diagnosis present

## 2014-01-23 DIAGNOSIS — Z7901 Long term (current) use of anticoagulants: Secondary | ICD-10-CM | POA: Diagnosis not present

## 2014-01-23 DIAGNOSIS — Z681 Body mass index (BMI) 19 or less, adult: Secondary | ICD-10-CM | POA: Diagnosis not present

## 2014-01-23 DIAGNOSIS — Z885 Allergy status to narcotic agent status: Secondary | ICD-10-CM | POA: Diagnosis not present

## 2014-01-23 DIAGNOSIS — Z881 Allergy status to other antibiotic agents status: Secondary | ICD-10-CM | POA: Diagnosis not present

## 2014-01-23 DIAGNOSIS — E780 Pure hypercholesterolemia: Secondary | ICD-10-CM | POA: Diagnosis present

## 2014-01-23 DIAGNOSIS — Z9221 Personal history of antineoplastic chemotherapy: Secondary | ICD-10-CM | POA: Diagnosis not present

## 2014-01-23 DIAGNOSIS — Z8249 Family history of ischemic heart disease and other diseases of the circulatory system: Secondary | ICD-10-CM | POA: Diagnosis not present

## 2014-01-23 DIAGNOSIS — I10 Essential (primary) hypertension: Secondary | ICD-10-CM | POA: Diagnosis present

## 2014-01-23 DIAGNOSIS — E876 Hypokalemia: Secondary | ICD-10-CM | POA: Diagnosis not present

## 2014-01-23 DIAGNOSIS — Z923 Personal history of irradiation: Secondary | ICD-10-CM | POA: Diagnosis not present

## 2014-01-23 DIAGNOSIS — C20 Malignant neoplasm of rectum: Secondary | ICD-10-CM | POA: Diagnosis present

## 2014-01-23 DIAGNOSIS — Z90710 Acquired absence of both cervix and uterus: Secondary | ICD-10-CM | POA: Diagnosis not present

## 2014-01-23 HISTORY — DX: Urinary tract infection, site not specified: N39.0

## 2014-01-23 LAB — TSH: TSH: 1.83 u[IU]/mL (ref 0.350–4.500)

## 2014-01-23 LAB — CBC WITH DIFFERENTIAL/PLATELET
BASOS ABS: 0 10*3/uL (ref 0.0–0.1)
Basophils Relative: 1 % (ref 0–1)
EOS ABS: 0 10*3/uL (ref 0.0–0.7)
Eosinophils Relative: 1 % (ref 0–5)
HCT: 34 % — ABNORMAL LOW (ref 36.0–46.0)
Hemoglobin: 10.9 g/dL — ABNORMAL LOW (ref 12.0–15.0)
LYMPHS ABS: 0.3 10*3/uL — AB (ref 0.7–4.0)
LYMPHS PCT: 8 % — AB (ref 12–46)
MCH: 31 pg (ref 26.0–34.0)
MCHC: 32.1 g/dL (ref 30.0–36.0)
MCV: 96.6 fL (ref 78.0–100.0)
Monocytes Absolute: 0.4 10*3/uL (ref 0.1–1.0)
Monocytes Relative: 9 % (ref 3–12)
NEUTROS PCT: 81 % — AB (ref 43–77)
Neutro Abs: 3.3 10*3/uL (ref 1.7–7.7)
PLATELETS: 176 10*3/uL (ref 150–400)
RBC: 3.52 MIL/uL — ABNORMAL LOW (ref 3.87–5.11)
RDW: 13.7 % (ref 11.5–15.5)
WBC: 4.1 10*3/uL (ref 4.0–10.5)

## 2014-01-23 LAB — COMPREHENSIVE METABOLIC PANEL
ALT: 10 U/L (ref 0–35)
AST: 15 U/L (ref 0–37)
Albumin: 2.9 g/dL — ABNORMAL LOW (ref 3.5–5.2)
Alkaline Phosphatase: 62 U/L (ref 39–117)
Anion gap: 9 (ref 5–15)
BUN: 14 mg/dL (ref 6–23)
CO2: 29 meq/L (ref 19–32)
CREATININE: 0.4 mg/dL — AB (ref 0.50–1.10)
Calcium: 9 mg/dL (ref 8.4–10.5)
Chloride: 104 mEq/L (ref 96–112)
GLUCOSE: 105 mg/dL — AB (ref 70–99)
Potassium: 3.6 mEq/L — ABNORMAL LOW (ref 3.7–5.3)
Sodium: 142 mEq/L (ref 137–147)
Total Bilirubin: 0.7 mg/dL (ref 0.3–1.2)
Total Protein: 5.3 g/dL — ABNORMAL LOW (ref 6.0–8.3)

## 2014-01-23 MED ORDER — SODIUM CHLORIDE 0.9 % IV SOLN
INTRAVENOUS | Status: DC
  Administered 2014-01-23 (×2): via INTRAVENOUS

## 2014-01-23 MED ORDER — POTASSIUM CHLORIDE CRYS ER 20 MEQ PO TBCR
20.0000 meq | EXTENDED_RELEASE_TABLET | Freq: Every day | ORAL | Status: DC
  Administered 2014-01-23 – 2014-01-25 (×3): 20 meq via ORAL
  Filled 2014-01-23 (×3): qty 1

## 2014-01-23 MED ORDER — ONDANSETRON HCL 4 MG PO TABS: 4.0000 mg | ORAL_TABLET | Freq: Four times a day (QID) | ORAL | Status: DC | PRN

## 2014-01-23 MED ORDER — VITAMIN D3 25 MCG (1000 UNIT) PO TABS
2000.0000 [IU] | ORAL_TABLET | Freq: Every day | ORAL | Status: DC
  Administered 2014-01-23 – 2014-01-25 (×3): 2000 [IU] via ORAL
  Filled 2014-01-23 (×3): qty 2

## 2014-01-23 MED ORDER — ACETAMINOPHEN 325 MG PO TABS: 650.0000 mg | ORAL_TABLET | Freq: Four times a day (QID) | ORAL | Status: DC | PRN

## 2014-01-23 MED ORDER — CIPROFLOXACIN IN D5W 400 MG/200ML IV SOLN
400.0000 mg | Freq: Two times a day (BID) | INTRAVENOUS | Status: DC
  Administered 2014-01-23 – 2014-01-24 (×3): 400 mg via INTRAVENOUS
  Filled 2014-01-23 (×4): qty 200

## 2014-01-23 MED ORDER — ONDANSETRON HCL 4 MG/2ML IJ SOLN
INTRAMUSCULAR | Status: AC
  Administered 2014-01-23: 4 mg via INTRAVENOUS
  Filled 2014-01-23: qty 2

## 2014-01-23 MED ORDER — PRO-STAT SUGAR FREE PO LIQD
30.0000 mL | Freq: Two times a day (BID) | ORAL | Status: DC
  Administered 2014-01-23 – 2014-01-25 (×5): 30 mL via ORAL
  Filled 2014-01-23 (×5): qty 30

## 2014-01-23 MED ORDER — ACETAMINOPHEN 650 MG RE SUPP
650.0000 mg | Freq: Four times a day (QID) | RECTAL | Status: DC | PRN
Start: 2014-01-23 — End: 2014-01-25

## 2014-01-23 MED ORDER — RIVAROXABAN 20 MG PO TABS
20.0000 mg | ORAL_TABLET | Freq: Every day | ORAL | Status: DC
  Administered 2014-01-23 – 2014-01-24 (×2): 20 mg via ORAL
  Filled 2014-01-23 (×3): qty 1

## 2014-01-23 MED ORDER — LEVOTHYROXINE SODIUM 75 MCG PO TABS
75.0000 ug | ORAL_TABLET | ORAL | Status: DC
  Administered 2014-01-23 – 2014-01-25 (×2): 75 ug via ORAL
  Filled 2014-01-23 (×2): qty 1

## 2014-01-23 MED ORDER — BOOST / RESOURCE BREEZE PO LIQD
1.0000 | Freq: Two times a day (BID) | ORAL | Status: DC
  Administered 2014-01-23 – 2014-01-25 (×5): 1 via ORAL

## 2014-01-23 MED ORDER — LEVOTHYROXINE SODIUM 25 MCG PO TABS
25.0000 ug | ORAL_TABLET | ORAL | Status: DC
  Administered 2014-01-24: 25 ug via ORAL
  Filled 2014-01-23 (×2): qty 1

## 2014-01-23 MED ORDER — ONDANSETRON HCL 4 MG/2ML IJ SOLN
4.0000 mg | Freq: Four times a day (QID) | INTRAMUSCULAR | Status: DC | PRN
  Administered 2014-01-23: 4 mg via INTRAVENOUS

## 2014-01-23 MED ORDER — ONDANSETRON HCL 4 MG PO TABS: 8.0000 mg | ORAL_TABLET | Freq: Three times a day (TID) | ORAL | Status: DC | PRN

## 2014-01-23 NOTE — Evaluation (Signed)
Physical Therapy Evaluation Patient Details Name: Bethany [name] MRN: [id number] DOB: [birth year] Today's Date: 01/23/2014   History of Present Illness  HPI: Bethany [name] is a 80 y.o. female with a history of pulmonary embolism, rectal cancer, atrial fibrillation, hypothyroidism was brought to the ER after patient had a fall. As per patient's nephew patient was recently discharged from rehabilitation 5 days ago. Patient was in the rehabilitation after was diagnosed with pulmonary embolism. Patient was trying to walk to her commode when suddenly she lost balance and fell and hit her head. Patient did not lose consciousness. In the ER initially chest x-ray was showing possible fractures of the ribs but CAT scan chest did not show any fractures. CT head was negative for anything acute.  Past Medical History  Diagnosis Date  . Migraine   . GERD (gastroesophageal reflux disease)   . Hypertension   . Hypercholesteremia   . Pulmonary embolism 01/01/2012  . Hypothyroidism   . Rectal mass   . Ovarian tumor     sees dr Lisbeth Renshaw ob-gyn yearly for evaluation  . Cancer 09/29/13    rectal adenocarcinoma  . Rectal cancer 09/29/13    invasive adeocarcinoma  . Hx of radiation therapy 10/25/13-11/28/13    pelvis, 4500 cGy in 25 sessions  . History of radiation therapy 10/25/13- 11/28/13    pelvis 4500 cGy 25 sessions, no rectal boost   Past Surgical History  Procedure Laterality Date  . Ovarian cyst surgery  yrs ago  . Cataract extraction Bilateral   . Colonoscopy N/A 09/22/2013    Procedure: COLONOSCOPY;  Surgeon: Beryle Beams, MD;  Location: [place];  Service: Endoscopy;  Laterality: N/A;  . Eus N/A 09/29/2013    Procedure: LOWER ENDOSCOPIC ULTRASOUND (EUS);  Surgeon: Beryle Beams, MD;  Location: [facility];  Service: Endoscopy;  Laterality: N/A;  . Abdominal hysterectomy      years ago, prolapsed uterus     Clinical Impression   Pt admitted with above diagnosis. Pt currently with functional  limitations due to the deficits listed below (see PT Problem List).  Pt will benefit from skilled PT to increase their independence and safety with mobility to allow discharge to the venue listed below.        Follow Up Recommendations SNF;Supervision/Assistance - 24 hour    Equipment Recommendations  Wheelchair (measurements PT);Wheelchair cushion (measurements PT)    Recommendations for Other Services OT consult     Precautions / Restrictions Precautions Precautions: Fall      Mobility  Bed Mobility Overal bed mobility: Needs Assistance Bed Mobility: Supine to Sit     Supine to sit: Mod assist     General bed mobility comments: Cues for technique; Required physical assist to elevate trunk to sitting  Transfers Overall transfer level: Needs assistance Equipment used: Rolling walker (2 wheeled) Transfers: Stand Pivot Transfers   Stand pivot transfers: Mod assist       General transfer comment: Cues for technqiue and gentle cueing to encourage participation; Initially, pt did not push through LEs to stand, and we weren't successful, unable to acheive lift off of bed; With encouragement to push through feet to stand, she stood mush more smoothly; Max cues (but gentle cues) to let go of bedrail as pt held to bed with L hand, making the transition to the chair much harder  Ambulation/Gait             General Gait Details: Pt much too anxious to walk safely today  Stairs            Wheelchair Mobility    Modified Rankin (Stroke Patients Only)       Balance Overall balance assessment: Needs assistance;History of Falls Sitting-balance support: Bilateral upper extremity supported Sitting balance-Leahy Scale: Fair       Standing balance-Leahy Scale: Poor                               Pertinent Vitals/Pain Pain Assessment: Faces Faces Pain Scale: Hurts even more Pain Location: ribs Pain Descriptors / Indicators: Aching;Grimacing Pain  Intervention(s): Monitored during session;Other (comment) (chose not to use gait belt)    Home Living Family/patient expects to be discharged to:: Private residence Living Arrangements: Other relatives (nephew)   Type of Home: House Home Access: Stairs to enter Entrance Stairs-Rails: None Entrance Stairs-Number of Steps: 2 Home Layout: One level Home Equipment: Environmental consultant - 2 wheels;Shower seat      Prior Function Level of Independence: Needs assistance   Gait / Transfers Assistance Needed: with RW  ADL's / Homemaking Assistance Needed: nephew handled housework        Hand Dominance        Extremity/Trunk Assessment   Upper Extremity Assessment: Generalized weakness           Lower Extremity Assessment: Generalized weakness         Communication   Communication: Expressive difficulties (anxiety/wimpering limited ability to speak)  Cognition Arousal/Alertness: Awake/alert Behavior During Therapy: WFL for tasks assessed/performed Overall Cognitive Status: Within Functional Limits for tasks assessed                      General Comments      Exercises        Assessment/Plan    PT Assessment Patient needs continued PT services  PT Diagnosis Difficulty walking;Generalized weakness;Acute pain   PT Problem List Decreased strength;Decreased range of motion;Decreased activity tolerance;Decreased balance;Decreased mobility;Decreased cognition;Decreased knowledge of use of DME;Decreased safety awareness;Pain;Decreased knowledge of precautions  PT Treatment Interventions DME instruction;Gait training;Stair training;Functional mobility training;Therapeutic activities;Therapeutic exercise;Patient/family education   PT Goals (Current goals can be found in the Care Plan section) Acute Rehab PT Goals Patient Stated Goal: Would rather go home; if SNF is necessary, she'd rather not go back to [facility] PT Goal Formulation: With patient/family Time For Goal  Achievement: 02/06/14 Potential to Achieve Goals: Fair    Frequency Min 3X/week   Barriers to discharge   More difficulty managing at home    Co-evaluation               End of Session   Activity Tolerance: Other (comment) (Significantly limited by anxiety) Patient left: in chair;with call bell/phone within reach;with family/visitor present (chair alarm pad in chair (no box in room)) Nurse Communication: Mobility status;Other (comment) (possible need for chair alarm box)         Time: 9179-1505 PT Time Calculation (min) (ACUTE ONLY): 23 min   Charges:   PT Evaluation $Initial PT Evaluation Tier I: 1 Procedure PT Treatments $Therapeutic Activity: 8-22 mins   PT G Codes:          Quin Hoop 01/23/2014, 4:21 PM  [place] [place], [place]  Acute Rehabilitation Services Pager [phone] Office [phone]

## 2014-01-23 NOTE — H&P (Signed)
[facility] History and Physical  Bethany [name] TKZ:[id number] DOB: [birth year] DOA: 01/22/2014  Referring physician: ER physician. PCP: [name], MD   Chief Complaint: Fall.  HPI: Bethany [name] is a 80 y.o. female with a history of pulmonary embolism, rectal cancer, atrial fibrillation, hypothyroidism was brought to the ER after patient had a fall. As per patient's nephew patient was recently discharged from rehabilitation 5 days ago. Patient was in the rehabilitation after was diagnosed with pulmonary embolism. Patient was trying to walk to her commode when suddenly she lost balance and fell and hit her head. Patient did not lose consciousness. In the ER initially chest x-ray was showing possible fractures of the ribs but CAT scan chest did not show any fractures. CT head was negative for anything acute. Patient on exam is very weak and will be admitted for further management and possible placement. Patient still has diarrhea. Denies any chest pain or shortness of breath.   Review of Systems: As presented in the history of presenting illness, rest negative.  Past Medical History  Diagnosis Date  . Migraine   . GERD (gastroesophageal reflux disease)   . Hypertension   . Hypercholesteremia   . Pulmonary embolism 01/01/2012  . Hypothyroidism   . Rectal mass   . Ovarian tumor     sees dr Lisbeth Renshaw ob-gyn yearly for evaluation  . Cancer 09/29/13    rectal adenocarcinoma  . Rectal cancer 09/29/13    invasive adeocarcinoma  . Hx of radiation therapy 10/25/13-11/28/13    pelvis, 4500 cGy in 25 sessions  . History of radiation therapy 10/25/13- 11/28/13    pelvis 4500 cGy 25 sessions, no rectal boost   Past Surgical History  Procedure Laterality Date  . Ovarian cyst surgery  yrs ago  . Cataract extraction Bilateral   . Colonoscopy N/A 09/22/2013    Procedure: COLONOSCOPY;  Surgeon: Beryle Beams, MD;  Location: [place];  Service: Endoscopy;  Laterality: N/A;  . Eus N/A 09/29/2013     Procedure: LOWER ENDOSCOPIC ULTRASOUND (EUS);  Surgeon: Beryle Beams, MD;  Location: [facility];  Service: Endoscopy;  Laterality: N/A;  . Abdominal hysterectomy      years ago, prolapsed uterus   Social History:  reports that she has never smoked. She has never used smokeless tobacco. She reports that she does not drink alcohol or use illicit drugs. Where does patient live home. Can patient participate in ADLs? No.  Allergies  Allergen Reactions  . Cataflam [Diclofenac] Other (See Comments)    Unknown; patient is unaware of allergy.  . Cephalosporins Shortness Of Breath  . Erythromycin Shortness Of Breath  . Keflex [Cephalexin] Shortness Of Breath  . Codeine Nausea And Vomiting  . Darvocet [Propoxyphene N-Acetaminophen] Other (See Comments)    Unknown; patient is unaware of allergy  . Naproxen Other (See Comments)    Unknown; patient is unaware of allergy  . Penicillins Other (See Comments)    Patient states that she CAN take this; unaware of allergy  . Synalgos-Dc [Aspirin-Caff-Dihydrocodeine] Other (See Comments)    Unknown; patient is unaware of allergy  . Ultram [Tramadol] Other (See Comments)    Unknown; patient is unaware of allergy    Family History:  Family History  Problem Relation Age of Onset  . Brain cancer Mother   . Heart attack Father   . Diabetes Sister   . Heart disease Sister       Prior to Admission medications   Medication Sig Start  Date End Date Taking? Authorizing Provider  cholecalciferol (VITAMIN D) 1000 UNITS tablet Take 2,000 Units by mouth daily.   Yes Historical Provider, MD  levothyroxine (SYNTHROID, LEVOTHROID) 25 MCG tablet Take 25 mcg by mouth See admin instructions. 25 mcg  on Monday, Wednesday, Friday and Sunday.   Yes Historical Provider, MD  levothyroxine (SYNTHROID, LEVOTHROID) 50 MCG tablet Take 75 mcg by mouth daily before breakfast. Tuesday Thursday and Saturday   Yes Historical Provider, MD  potassium chloride SA (K-DUR,KLOR-CON)  20 MEQ tablet Take 20 mEq by mouth daily.   Yes Historical Provider, MD  rivaroxaban (XARELTO) 20 MG TABS tablet Take 1 tablet (20 mg total) by mouth daily with supper. 12/21/13  Yes Maryann Mikhail, DO  ondansetron (ZOFRAN) 8 MG tablet Take 1 tablet (8 mg total) by mouth every 8 (eight) hours as needed for nausea or vomiting. Patient not taking: Reported on 01/22/2014 11/06/13   Maryanna Shape, NP    Physical Exam: Filed Vitals:   01/22/14 2300 01/22/14 2315 01/22/14 2330 01/23/14 0026  BP: 134/81 134/65 145/68 141/76  Pulse: 90 87 92 88  Temp:      TempSrc:      Resp:  21 25 18   Height:      Weight:      SpO2: 100% 99% 100% 100%     General:  Well-developed and nourished.  Eyes: Anicteric no pallor.  ENT: No discharge from the ears eyes nose and mouth.  Neck: No mass felt.  Cardiovascular: S1-S2 heard.  Respiratory: No rhonchi or crepitations.  Abdomen: Soft nontender bowel sounds present.  Skin: No rash.  Musculoskeletal: No edema.  Psychiatric: Appears normal.  Neurologic: Alert awake oriented to time place and person. Moves all extremities.  Labs on Admission:  Basic Metabolic Panel:  Recent Labs Lab 01/22/14 1932 01/22/14 2012  NA 141 141  K 3.1* 3.3*  CL 101 102  CO2  --  28  GLUCOSE 83 79  BUN 17 17  CREATININE 0.50 0.50  CALCIUM  --  9.1   Liver Function Tests: No results for input(s): AST, ALT, ALKPHOS, BILITOT, PROT, ALBUMIN in the last 168 hours. No results for input(s): LIPASE, AMYLASE in the last 168 hours. No results for input(s): AMMONIA in the last 168 hours. CBC:  Recent Labs Lab 01/22/14 1932 01/22/14 2012  WBC  --  6.7  NEUTROABS  --  5.7  HGB 12.9 11.5*  HCT 38.0 35.5*  MCV  --  96.7  PLT  --  168   Cardiac Enzymes: No results for input(s): CKTOTAL, CKMB, CKMBINDEX, TROPONINI in the last 168 hours.  BNP (last 3 results) No results for input(s): PROBNP in the last 8760 hours. CBG:  Recent Labs Lab 01/22/14 1942   GLUCAP 75    Radiological Exams on Admission: Dg Ribs Unilateral W/chest Left  01/22/2014   CLINICAL DATA:  Trip and fall at 4 p.m. Left-sided numbness and weakness. Left lower rib pain.  EXAM: LEFT RIBS AND CHEST - 3+ VIEW  COMPARISON:  04/25/2013  FINDINGS: No pneumothorax or pleural effusion. Atherosclerotic aortic arch. Old healed right rib fractures. Bony demineralization. Left lower lateral rib irregularities favor nondisplaced fractures. These involve the left tenth and eleventh ribs and potentially the left seventh and ninth ribs.  IMPRESSION: 1. Nondisplaced left lower rib fractures laterally. 2. Old right rib fractures. 3. No pneumothorax or pleural effusion.   Electronically Signed   By: Sherryl Barters M.D.   On: 01/22/2014 21:22  Dg Thoracic Spine 2 View  01/22/2014   CLINICAL DATA:  Left-sided numbness and weakness after tripping and falling at home.  EXAM: THORACIC SPINE - 2 VIEW  COMPARISON:  CT scan of November 28, 2013.  FINDINGS: No acute fracture or significant spondylolisthesis is noted. Mild anterior osteophyte formation is noted throughout the thoracic spine. Diffuse osteopenia is noted.  IMPRESSION: No acute abnormality seen in the thoracic spine.   Electronically Signed   By: Sabino Dick M.D.   On: 01/22/2014 21:22   Ct Head Wo Contrast  01/22/2014   CLINICAL DATA:  Fall at home around 4 p.m. 4 cm contusion along the left occiput. Anxiety. Pre-existing left-sided numbness and weakness reportedly contributed to fall.  EXAM: CT HEAD WITHOUT CONTRAST  TECHNIQUE: Contiguous axial images were obtained from the base of the skull through the vertex without intravenous contrast.  COMPARISON:  11/28/2013  FINDINGS: The brainstem, cerebellum, cerebral peduncles, thalamus, basal ganglia, basilar cisterns, and ventricular system appear within normal limits. Periventricular white matter and corona radiata hypodensities favor chronic ischemic microvascular white matter disease. No  intracranial hemorrhage, mass lesion, or acute CVA.  IMPRESSION: 1. No acute intracranial findings. 2. Periventricular white matter and corona radiata hypodensities favor chronic ischemic microvascular white matter disease.   Electronically Signed   By: Sherryl Barters M.D.   On: 01/22/2014 19:20   Ct Cervical Spine Wo Contrast  01/22/2014   CLINICAL DATA:  Patient tripped and fell at home. Denies loss of consciousness.  EXAM: CT CERVICAL SPINE WITHOUT CONTRAST  TECHNIQUE: Multidetector CT imaging of the cervical spine was performed without intravenous contrast. Multiplanar CT image reconstructions were also generated.  COMPARISON:  CT scan of November 28, 2013.  FINDINGS: No fracture or significant spondylolisthesis is noted. Mild degenerative disc disease is noted at C3-4, C5-6 and C6-7 with mild anterior osteophyte formation. Mild degenerative changes are seen involving the posterior facet joints bilaterally. Visualized portions of upper lung fields appear normal.  IMPRESSION: Multilevel degenerative disc disease. No acute abnormality seen in the cervical spine.   Electronically Signed   By: Sabino Dick M.D.   On: 01/22/2014 19:59    EKG: Independently reviewed. Sinus tachycardia.  Assessment/Plan Principal Problem:   Fall Active Problems:   Pulmonary embolism   Rectal cancer   Hypothyroidism   Fall from standing   UTI (lower urinary tract infection)   1. Fall with weakness - at this time patient will be closely monitored overnight intermittently to make sure there was no arrhythmias. Get physical therapy consult and patient may need to be placed in rehabilitation. 2. Pulmonary embolism - on xarelto. 3. History of atrial fibrillation presently appears to be in sinus rhythm - continue xarelto. 4. Rectal cancer - patient has received chemotherapy and radiation previously and is to follow with oncologist. Further recommendations per oncologist. 5. Hypothyroidism - continue  Synthroid. 6. Anemia - follow CBC.    Code Status: DO NOT RESUSCITATE.  Family Communication: Patient's nephew at the bedside.  Disposition Plan: Admit for observation.    Chrissy Ealey N. [facility] Pager [phone].  If 7PM-7AM, please contact night-coverage [email or web address] Password [facility] 01/23/2014, 12:47 AM

## 2014-01-23 NOTE — Progress Notes (Signed)
UR completed 

## 2014-01-23 NOTE — ED Notes (Signed)
Report attempted, no answer on 5N.

## 2014-01-23 NOTE — Progress Notes (Signed)
At time of admission to floor, pt and pt's nephew (with whom pt resides) indicated that the pt did have a living will that was added to the pt's chart at the time of pt's previous admission to [facility]. Pt's nephew stated that staff in the [facility] had been unable to locate document in pt's chart. Pt's nephew stated that this was "her DNR." Both this RN and charge nurse witnessed and participated in conversation. Living will located in EMR by this RN. On call NP, Chaney Malling notified and asked to review. Spoke with Lamar Blinks and relayed above stated conversation/scenario. [place] reviewed. Orders received. Pt's current code status is now DNR.

## 2014-01-23 NOTE — Progress Notes (Signed)
Patient admitted after midnight. Chart reviewed. Patient examined. PT note pending. Continue IVF, abx.  Doree Barthel, MD [facility]

## 2014-01-23 NOTE — Progress Notes (Signed)
INITIAL NUTRITION ASSESSMENT  Pt meets criteria for SEVERE MALNUTRITION in the context of chronic illness as evidenced by a 17% weight loss in 3 months and severe fat and muscle mass loss.  DOCUMENTATION CODES Per approved criteria  -Severe malnutrition in the context of chronic illness -Underweight   INTERVENTION: Provide Resource Breeze po BID, each supplement provides 250 kcal and 9 grams of protein  Provide 30 ml Prostat po BID, each supplement provides 100 kcal and 15 grams of protein.   Encourage adequate PO intake.  NUTRITION DIAGNOSIS: Malnutrition related to chronic illness as evidenced by weight loss and severe fat and muscle mass loss.   Goal: Pt to meet >/= 90% of their estimated nutrition needs   Monitor:  PO intake, weight trends, labs, I/O's  Reason for Assessment: MST  80 y.o. female  Admitting Dx: Fall  ASSESSMENT: Pt with a history of pulmonary embolism, rectal cancer, atrial fibrillation, hypothyroidism was brought to the ER after patient had a fall. Patient was trying to walk to her commode when suddenly she lost balance and fell and hit her head.  Pt reports her appetite is good currently and prior to admission at home eating 2 full meals a day along with drinking Resource Breeze at least once/day. Pt reports she has been eating most of her meals, however per Epic records, pt with 25% meal completion. Spoke with family member at bedside, pt has been continually losing weight since her radiation back in October. Noted pt with a 17% weight loss in 3 months. RD to order [employer]. Family member reports pt has also been taking a protein supplements at her previous rehab. Will order Prostat. Pt was encouraged to eat her food at meals and to take her supplements.   Nutrition Focused Physical Exam:  Subcutaneous Fat:  Orbital Region: N/A Upper Arm Region: Severe depletion Thoracic and Lumbar Region: WNL  Muscle:  Temple Region: Severe  depletion Clavicle Bone Region: Severe depletion Clavicle and Acromion Bone Region: Severe depletion Scapular Bone Region: N/A Dorsal Hand: Severe depletion Patellar Region: Moderate depletion Anterior Thigh Region: Moderate depletion Posterior Calf Region: Moderate depletion  Edema: none  Height: Ht Readings from Last 1 Encounters:  01/22/14 5\' 7"  (1.702 m)    Weight: Wt Readings from Last 1 Encounters:  01/22/14 116 lb (52.617 kg)    Ideal Body Weight: 135 lbs  % Ideal Body Weight: 86%  Wt Readings from Last 10 Encounters:  01/22/14 116 lb (52.617 kg)  11/30/13 122 lb 9.2 oz (55.6 kg)  11/27/13 129 lb 8 oz (58.741 kg)  11/23/13 129 lb 14.4 oz (58.922 kg)  11/20/13 133 lb (60.328 kg)  11/13/13 133 lb 14.4 oz (60.737 kg)  11/09/13 134 lb 4.8 oz (60.918 kg)  11/08/13 134 lb (60.782 kg)  11/07/13 133 lb 14.4 oz (60.737 kg)  11/06/13 134 lb 6.4 oz (60.963 kg)   Usual Body Weight: 140 lbs  % Usual Body Weight: 83%  BMI:  Body mass index is 18.16 kg/(m^2). Underweight  Estimated Nutritional Needs: Kcal: 1700-1900 Protein: 70-80 grams Fluid: 1.7 - 1.9 L/day  Skin: intact  Diet Order: Diet Heart  EDUCATION NEEDS: -No education needs identified at this time   Intake/Output Summary (Last 24 hours) at 01/23/14 0947 Last data filed at 01/23/14 0600  Gross per 24 hour  Intake 246.67 ml  Output      0 ml  Net 246.67 ml    Last BM: 12/14  Labs:   Recent Labs Lab  01/22/14 1932 01/22/14 2012 01/23/14 0511  NA 141 141 142  K 3.1* 3.3* 3.6*  CL 101 102 104  CO2  --  28 29  BUN 17 17 14   CREATININE 0.50 0.50 0.40*  CALCIUM  --  9.1 9.0  GLUCOSE 83 79 105*    CBG (last 3)   Recent Labs  01/22/14 1942  GLUCAP 75    Scheduled Meds: . cholecalciferol  2,000 Units Oral Daily  . ciprofloxacin  400 mg Intravenous Q12H  . [START ON 01/24/2014] levothyroxine  25 mcg Oral Once per day on Sun Mon Wed Fri  . levothyroxine  75 mcg Oral Once per day on  Tue Thu Sat  . potassium chloride SA  20 mEq Oral Daily  . rivaroxaban  20 mg Oral Q supper    Continuous Infusions: . sodium chloride 50 mL/hr at 01/23/14 0104    Past Medical History  Diagnosis Date  . Migraine   . GERD (gastroesophageal reflux disease)   . Hypertension   . Hypercholesteremia   . Pulmonary embolism 01/01/2012  . Hypothyroidism   . Rectal mass   . Ovarian tumor     sees dr Lisbeth Renshaw ob-gyn yearly for evaluation  . Cancer 09/29/13    rectal adenocarcinoma  . Rectal cancer 09/29/13    invasive adeocarcinoma  . Hx of radiation therapy 10/25/13-11/28/13    pelvis, 4500 cGy in 25 sessions  . History of radiation therapy 10/25/13- 11/28/13    pelvis 4500 cGy 25 sessions, no rectal boost    Past Surgical History  Procedure Laterality Date  . Ovarian cyst surgery  yrs ago  . Cataract extraction Bilateral   . Colonoscopy N/A 09/22/2013    Procedure: COLONOSCOPY;  Surgeon: Beryle Beams, MD;  Location: [place];  Service: Endoscopy;  Laterality: N/A;  . Eus N/A 09/29/2013    Procedure: LOWER ENDOSCOPIC ULTRASOUND (EUS);  Surgeon: Beryle Beams, MD;  Location: [facility];  Service: Endoscopy;  Laterality: N/A;  . Abdominal hysterectomy      years ago, prolapsed uterus    Kallie Locks, MS, RD, LDN Pager # [phone] After hours/ weekend pager # [phone]

## 2014-01-23 NOTE — Telephone Encounter (Signed)
returned pt call and cx appt per pt in hospital....lvm for pt to call back to r/s

## 2014-01-24 ENCOUNTER — Encounter (HOSPITAL_COMMUNITY): Payer: Self-pay | Admitting: General Practice

## 2014-01-24 DIAGNOSIS — A0472 Enterocolitis due to Clostridium difficile, not specified as recurrent: Secondary | ICD-10-CM | POA: Diagnosis present

## 2014-01-24 DIAGNOSIS — N390 Urinary tract infection, site not specified: Principal | ICD-10-CM

## 2014-01-24 LAB — CLOSTRIDIUM DIFFICILE BY PCR: CDIFFPCR: POSITIVE — AB

## 2014-01-24 MED ORDER — CIPROFLOXACIN HCL 500 MG PO TABS
500.0000 mg | ORAL_TABLET | Freq: Two times a day (BID) | ORAL | Status: DC
  Administered 2014-01-24 – 2014-01-25 (×2): 500 mg via ORAL
  Filled 2014-01-24 (×4): qty 1

## 2014-01-24 MED ORDER — METRONIDAZOLE 500 MG PO TABS
500.0000 mg | ORAL_TABLET | Freq: Three times a day (TID) | ORAL | Status: DC
  Administered 2014-01-24 – 2014-01-25 (×4): 500 mg via ORAL
  Filled 2014-01-24 (×7): qty 1

## 2014-01-24 NOTE — Clinical Documentation Improvement (Signed)
Supporting Information: Pt meets criteria for SEVERE MALNUTRITION in the context of chronic illness as evidenced by a 17% weight loss in 3 months and severe fat and muscle mass loss per 12/15 Registered Dietician's evaluation.    Possible Clinical Conditions? . Severity: --Mild Malnutrition (first degree) --Moderate Malnutrition (second degree) --Severe Malnutrition (third degree) --Other . Document any associated diagnoses/conditions   Thank Sherian Maroon Documentation Specialist [phone] [email or web address] [email or web address]

## 2014-01-24 NOTE — Progress Notes (Signed)
[facility] PROGRESS NOTE  Bethany [name] HYI:[id number] DOB: [birth year] DOA: 01/22/2014 PCP: [name], MD  Assessment/Plan:  Principal Problem:   Fall: PT recommending SNF Active Problems:   Pulmonary embolism   Rectal cancer   Hypothyroidism   UTI (lower urinary tract infection): cx growing GNR. Change to PO abx   Enteritis due to Clostridium difficile: only one stool since admission. Per rn, soft, not loose, but will go ahead and resume flagyl, as pt on abx for UTI  Stable for transfer to SNF when bed found.  HPI/Subjective: No complaints. Eating well. No abdominal pain or bloating  Objective: Filed Vitals:   01/24/14 1400  BP: 134/70  Pulse: 66  Temp: 97.7 F (36.5 C)  Resp: 16    Intake/Output Summary (Last 24 hours) at 01/24/14 1546 Last data filed at 01/24/14 0800  Gross per 24 hour  Intake    120 ml  Output      0 ml  Net    120 ml   Filed Weights   01/22/14 1816  Weight: 52.617 kg (116 lb)    Exam:   General:  In bed. Oriented. anxious  Cardiovascular: RRR without MGR  Respiratory: CTA without WRR  Abdomen: S, NT, ND  Ext: no CCE  Basic Metabolic Panel:  Recent Labs Lab 01/22/14 1932 01/22/14 2012 01/23/14 0511  NA 141 141 142  K 3.1* 3.3* 3.6*  CL 101 102 104  CO2  --  28 29  GLUCOSE 83 79 105*  BUN 17 17 14   CREATININE 0.50 0.50 0.40*  CALCIUM  --  9.1 9.0   Liver Function Tests:  Recent Labs Lab 01/23/14 0511  AST 15  ALT 10  ALKPHOS 62  BILITOT 0.7  PROT 5.3*  ALBUMIN 2.9*   No results for input(s): LIPASE, AMYLASE in the last 168 hours. No results for input(s): AMMONIA in the last 168 hours. CBC:  Recent Labs Lab 01/22/14 1932 01/22/14 2012 01/23/14 0511  WBC  --  6.7 4.1  NEUTROABS  --  5.7 3.3  HGB 12.9 11.5* 10.9*  HCT 38.0 35.5* 34.0*  MCV  --  96.7 96.6  PLT  --  168 176   Cardiac Enzymes: No results for input(s): CKTOTAL, CKMB, CKMBINDEX, TROPONINI in the last 168 hours. BNP (last 3  results) No results for input(s): PROBNP in the last 8760 hours. CBG:  Recent Labs Lab 01/22/14 1942  GLUCAP 75    Recent Results (from the past 240 hour(s))  Urine culture     Status: None (Preliminary result)   Collection Time: 01/22/14  7:30 PM  Result Value Ref Range Status   Specimen Description URINE, CLEAN CATCH  Final   Special Requests NONE  Final   Culture  Setup Time   Final    01/23/2014 03:51 Performed at [place]   Final    >=100,000 COLONIES/ML Performed at [facility]    Culture   Final    [place] Performed at [facility]    Report Status PENDING  Incomplete  Clostridium Difficile by PCR     Status: Abnormal   Collection Time: 01/24/14 12:53 PM  Result Value Ref Range Status   C difficile by pcr POSITIVE (A) NEGATIVE Final    Comment: CRITICAL RESULT CALLED TO, READ BACK BY AND VERIFIED WITH: D. SCOTT RN 14:15 01/24/14 ([email or web address])      Studies: Dg Ribs Unilateral W/chest Left  01/22/2014  CLINICAL DATA:  Trip and fall at 4 p.m. Left-sided numbness and weakness. Left lower rib pain.  EXAM: LEFT RIBS AND CHEST - 3+ VIEW  COMPARISON:  04/25/2013  FINDINGS: No pneumothorax or pleural effusion. Atherosclerotic aortic arch. Old healed right rib fractures. Bony demineralization. Left lower lateral rib irregularities favor nondisplaced fractures. These involve the left tenth and eleventh ribs and potentially the left seventh and ninth ribs.  IMPRESSION: 1. Nondisplaced left lower rib fractures laterally. 2. Old right rib fractures. 3. No pneumothorax or pleural effusion.   Electronically Signed   By: Sherryl Barters M.D.   On: 01/22/2014 21:22   Dg Thoracic Spine 2 View  01/22/2014   CLINICAL DATA:  Left-sided numbness and weakness after tripping and falling at home.  EXAM: THORACIC SPINE - 2 VIEW  COMPARISON:  CT scan of November 28, 2013.  FINDINGS: No acute fracture or significant spondylolisthesis is  noted. Mild anterior osteophyte formation is noted throughout the thoracic spine. Diffuse osteopenia is noted.  IMPRESSION: No acute abnormality seen in the thoracic spine.   Electronically Signed   By: Sabino Dick M.D.   On: 01/22/2014 21:22   Ct Head Wo Contrast  01/22/2014   CLINICAL DATA:  Fall at home around 4 p.m. 4 cm contusion along the left occiput. Anxiety. Pre-existing left-sided numbness and weakness reportedly contributed to fall.  EXAM: CT HEAD WITHOUT CONTRAST  TECHNIQUE: Contiguous axial images were obtained from the base of the skull through the vertex without intravenous contrast.  COMPARISON:  11/28/2013  FINDINGS: The brainstem, cerebellum, cerebral peduncles, thalamus, basal ganglia, basilar cisterns, and ventricular system appear within normal limits. Periventricular white matter and corona radiata hypodensities favor chronic ischemic microvascular white matter disease. No intracranial hemorrhage, mass lesion, or acute CVA.  IMPRESSION: 1. No acute intracranial findings. 2. Periventricular white matter and corona radiata hypodensities favor chronic ischemic microvascular white matter disease.   Electronically Signed   By: Sherryl Barters M.D.   On: 01/22/2014 19:20   Ct Chest Wo Contrast  01/23/2014   CLINICAL DATA:  Fall with left-sided chest wall pain  EXAM: CT CHEST WITHOUT CONTRAST  TECHNIQUE: Multidetector CT imaging of the chest was performed following the standard protocol without IV contrast.  COMPARISON:  11/28/2013  FINDINGS: THORACIC INLET/BODY WALL:  No acute abnormality.  MEDIASTINUM:  Normal heart size. No pericardial effusion. Scattered atherosclerosis, typical for age. No acute vascular abnormality. No adenopathy.  LUNG WINDOWS:  No consolidation. No hemothorax or pneumothorax. No suspicious pulmonary nodule (a right apical pulmonary nodule on image 13 is size stable since 2013 at least.  UPPER ABDOMEN:  No acute findings.  Peripelvic cyst on the left.  OSSEOUS:   Previously described left side of rib fractures are not clearly visualized by CT. No displaced rib fractures identified. Mild T11 vertebral body wedging which is chronic. Remote ninth lateral rib fracture.  IMPRESSION: No displaced rib fracture or intrathoracic injury. Rib fractures described on preceding radiography are not clearly visualized.   Electronically Signed   By: Jorje Guild M.D.   On: 01/23/2014 01:34   Ct Cervical Spine Wo Contrast  01/22/2014   CLINICAL DATA:  Patient tripped and fell at home. Denies loss of consciousness.  EXAM: CT CERVICAL SPINE WITHOUT CONTRAST  TECHNIQUE: Multidetector CT imaging of the cervical spine was performed without intravenous contrast. Multiplanar CT image reconstructions were also generated.  COMPARISON:  CT scan of November 28, 2013.  FINDINGS: No fracture or significant spondylolisthesis is noted. Mild  degenerative disc disease is noted at C3-4, C5-6 and C6-7 with mild anterior osteophyte formation. Mild degenerative changes are seen involving the posterior facet joints bilaterally. Visualized portions of upper lung fields appear normal.  IMPRESSION: Multilevel degenerative disc disease. No acute abnormality seen in the cervical spine.   Electronically Signed   By: Sabino Dick M.D.   On: 01/22/2014 19:59    Scheduled Meds: . cholecalciferol  2,000 Units Oral Daily  . ciprofloxacin  500 mg Oral BID  . feeding supplement (PRO-STAT SUGAR FREE 64)  30 mL Oral BID BM  . feeding supplement (RESOURCE BREEZE)  1 Container Oral BID BM  . levothyroxine  25 mcg Oral Once per day on Sun Mon Wed Fri  . levothyroxine  75 mcg Oral Once per day on Tue Thu Sat  . metroNIDAZOLE  500 mg Oral 3 times per day  . potassium chloride SA  20 mEq Oral Daily  . rivaroxaban  20 mg Oral Q supper   Continuous Infusions:   Time spent: 25 minutes  [facility] [email or web address], password [facility] 01/24/2014, 3:46 PM  LOS: 2 days

## 2014-01-25 ENCOUNTER — Ambulatory Visit: Payer: Medicare Other | Admitting: Physician Assistant

## 2014-01-25 ENCOUNTER — Other Ambulatory Visit: Payer: Medicare Other

## 2014-01-25 DIAGNOSIS — W19XXXD Unspecified fall, subsequent encounter: Secondary | ICD-10-CM

## 2014-01-25 LAB — URINE CULTURE

## 2014-01-25 LAB — BASIC METABOLIC PANEL
Anion gap: 8 (ref 5–15)
BUN: 19 mg/dL (ref 6–23)
CALCIUM: 8.9 mg/dL (ref 8.4–10.5)
CO2: 29 mEq/L (ref 19–32)
Chloride: 104 mEq/L (ref 96–112)
Creatinine, Ser: 0.48 mg/dL — ABNORMAL LOW (ref 0.50–1.10)
GFR calc Af Amer: >90 mL/min (ref >90)
GFR, EST NON AFRICAN AMERICAN: 88 mL/min — AB (ref >90)
GLUCOSE: 95 mg/dL (ref 70–99)
Potassium: 3.7 mEq/L (ref 3.7–5.3)
Sodium: 141 mEq/L (ref 137–147)

## 2014-01-25 MED ORDER — METRONIDAZOLE 500 MG PO TABS: 500.0000 mg | ORAL_TABLET | Freq: Three times a day (TID) | ORAL | Status: DC

## 2014-01-25 MED ORDER — ACETAMINOPHEN 325 MG PO TABS: 650.0000 mg | ORAL_TABLET | Freq: Four times a day (QID) | ORAL | Status: DC | PRN

## 2014-01-25 MED ORDER — PRO-STAT SUGAR FREE PO LIQD: 30.0000 mL | Freq: Two times a day (BID) | ORAL | Status: DC

## 2014-01-25 MED ORDER — BOOST / RESOURCE BREEZE PO LIQD: 1.0000 | Freq: Two times a day (BID) | ORAL | Status: DC

## 2014-01-25 NOTE — Discharge Instructions (Signed)
Information on my medicine - XARELTO (Rivaroxaban)  This medication education was reviewed with me or my healthcare representative as part of my discharge preparation.  The pharmacist that spoke with me during my hospital stay was:  Arman Bogus, [facility]  Why was Xarelto prescribed for you? Xarelto was prescribed for you to reduce the risk of a blood clot forming that can cause a stroke if you have a medical condition called atrial fibrillation (a type of irregular heartbeat) and for your history of recent blood clot in the lungs (pulmonary embolus).  What do you need to know about xarelto ? Take your Xarelto ONCE DAILY at the same time every day with your evening meal. If you have difficulty swallowing the tablet whole, you may crush it and mix in applesauce just prior to taking your dose.  Take Xarelto exactly as prescribed by your doctor and DO NOT stop taking Xarelto without talking to the doctor who prescribed the medication.  Stopping without other stroke prevention medication to take the place of Xarelto may increase your risk of developing a clot that causes a stroke.  Refill your prescription before you run out.  After discharge, you should have regular check-up appointments with your healthcare provider that is prescribing your Xarelto.  In the future your dose may need to be changed if your kidney function or weight changes by a significant amount.  What do you do if you miss a dose? If you are taking Xarelto ONCE DAILY and you miss a dose, take it as soon as you remember on the same day then continue your regularly scheduled once daily regimen the next day. Do not take two doses of Xarelto at the same time or on the same day.   Important Safety Information A possible side effect of Xarelto is bleeding. You should call your healthcare provider right away if you experience any of the following: ? Bleeding from an injury or your nose that does not stop. ? Unusual colored  urine (red or dark brown) or unusual colored stools (red or black). ? Unusual bruising for unknown reasons. ? A serious fall or if you hit your head (even if there is no bleeding).  Some medicines may interact with Xarelto and might increase your risk of bleeding while on Xarelto. To help avoid this, consult your healthcare provider or pharmacist prior to using any new prescription or non-prescription medications, including herbals, vitamins, non-steroidal anti-inflammatory drugs (NSAIDs) and supplements.  This website has more information on Xarelto: [email or web address]

## 2014-01-25 NOTE — Clinical Social Work Note (Addendum)
Patient to dc today to [facility] per MD order RN to call report to: [phone] prior to transportation Transportation: [facility]- scheduled for 2:15pm   CSW reviewed dc plans with both patient and patient's nephew, Juleen China.  Both are in agreement to these dc plans.  Patient will have a private room at SNF due to contact precautions.  SNF has been contacted and confirmed bed readiness.  DC summary sent to SNF and packet complete.  RN aware/agreeable.  Nonnie Done, [place] [phone]  Psychiatric & Orthopedics (5N 1-16) Clinical Social Worker

## 2014-01-25 NOTE — Progress Notes (Signed)
Called report to [facility], reported off to [facility]

## 2014-01-25 NOTE — Discharge Summary (Addendum)
Physician Discharge Summary  Bethany [name] KKX:[id number] DOB: 1929-01-22 DOA: 01/22/2014  PCP: Bethany Gravel, MD  Admit date: 01/22/2014 Discharge date: 01/25/2014  Time spent: greater than 30 minutes  Recommendations for Outpatient Follow-up:  1. Monitor potassium 2. To SNF for rehabilitation/strengthening  Discharge Diagnoses:  Principal Problem:   Fall Active Problems:  history of Pulmonary embolism   Rectal cancer   Hypothyroidism   UTI (lower urinary tract infection), treated   Enteritis due to Clostridium difficile, mild Hypokalemia Severe protein calorie malnutrition  Discharge Condition: stable  Filed Weights   01/22/14 1816  Weight: 52.617 kg (116 lb)    History of present illness/Hospital Course:  80 y.o. female with a history of pulmonary embolism, rectal cancer, atrial fibrillation, hypothyroidism, recent c diff infection. was brought to the ER after patient had a fall. As per patient's nephew patient was recently discharged from rehabilitation 5 days ago. Patient was in the rehabilitation after was diagnosed with pulmonary embolism. Patient was trying to walk to her commode when suddenly she lost balance and fell and hit her head. Patient did not lose consciousness. In the ER initially chest x-ray was showing possible fractures of the ribs but CAT scan chest did not show any fractures. CT head was negative for anything acute. Patient on exam is very weak and will be admitted for further management and possible placement. Patient still has diarrhea. Denies any chest pain or shortness of breath. Had slight dysuria, UA consistent with uti.  Admitted to [place]. Started on ciprofloxacin for urinary tract infection. Cultures have come back positive for Klebsiella.  Due to reports of loose stool prior to admission, stool was rechecked for C. difficile colitis. She did have C. difficile positivity by PCR, but only one semi-formed stool without significant diarrhea. Nevertheless,  as she was on antibiotics for urinary tract infection, will treat with 10 days of Flagyl. Have also Treatment for urinary tract infection to a minimum and she has completed three-day course of antibiotics. Her symptoms with respect to UTI are relatively mild. She has been afebrile, eating well, no leukocytosis, and hemodynamically stable.  She had hypokalemia on admission which was corrected. Recommend monitoring. She has a history of hypothyroidism and TSH was within normal limits. she has worked with physical therapy who are recommending short-term skilled nursing facility placement. She is medically stable for transfer today.    Procedures:  none  Consultations:  none  Discharge Exam: Filed Vitals:   01/25/14 0544  BP: 124/67  Pulse: 69  Temp: 97.7 F (36.5 C)  Resp: 18    General: alert, oriented. Nontoxic. Comfortable.  Cardiovascular: regular rate rhythm without murmurs gallops rubs  Respiratory: clear to auscultation bilaterally without wheezes rhonchi or rales   abdomen: soft nontender nondistended Extremities no clubbing cyanosis or edema   Discharge Instructions    Diet general    Complete by:  As directed      Increase activity slowly    Complete by:  As directed           Current Discharge Medication List    START taking these medications   Details  acetaminophen (TYLENOL) 325 MG tablet Take 2 tablets (650 mg total) by mouth every 6 (six) hours as needed for mild pain (or Fever >/= 101).    Amino Acids-Protein Hydrolys (FEEDING SUPPLEMENT, PRO-STAT SUGAR FREE 64,) LIQD Take 30 mLs by mouth 2 (two) times daily between meals. Qty: 900 mL, Refills: 0    feeding supplement, RESOURCE BREEZE, (  RESOURCE BREEZE) LIQD Take 1 Container by mouth 2 (two) times daily between meals. Refills: 0    metroNIDAZOLE (FLAGYL) 500 MG tablet Take 1 tablet (500 mg total) by mouth every 8 (eight) hours. Through 02/03/14      CONTINUE these medications which have NOT CHANGED    Details  cholecalciferol (VITAMIN D) 1000 UNITS tablet Take 2,000 Units by mouth daily.    !! levothyroxine (SYNTHROID, LEVOTHROID) 25 MCG tablet Take 25 mcg by mouth See admin instructions. 25 mcg  on Monday, Wednesday, Friday and Sunday.    !! levothyroxine (SYNTHROID, LEVOTHROID) 50 MCG tablet Take 75 mcg by mouth daily before breakfast. Tuesday Thursday and Saturday    potassium chloride SA (K-DUR,KLOR-CON) 20 MEQ tablet Take 20 mEq by mouth daily.    rivaroxaban (XARELTO) 20 MG TABS tablet Take 1 tablet (20 mg total) by mouth daily with supper. Qty: 30 tablet, Refills: 0    ondansetron (ZOFRAN) 8 MG tablet Take 1 tablet (8 mg total) by mouth every 8 (eight) hours as needed for nausea or vomiting. Qty: 20 tablet, Refills: 1   Associated Diagnoses: Rectal cancer     !! - Potential duplicate medications found. Please discuss with provider.     Allergies  Allergen Reactions  . Cataflam [Diclofenac] Other (See Comments)    Unknown; patient is unaware of allergy.  . Cephalosporins Shortness Of Breath  . Erythromycin Shortness Of Breath  . Keflex [Cephalexin] Shortness Of Breath  . Codeine Nausea And Vomiting  . Darvocet [Propoxyphene N-Acetaminophen] Other (See Comments)    Unknown; patient is unaware of allergy  . Naproxen Other (See Comments)    Unknown; patient is unaware of allergy  . Penicillins Other (See Comments)    Patient states that she CAN take this; unaware of allergy  . Synalgos-Dc [Aspirin-Caff-Dihydrocodeine] Other (See Comments)    Unknown; patient is unaware of allergy  . Ultram [Tramadol] Other (See Comments)    Unknown; patient is unaware of allergy      The results of significant diagnostics from this hospitalization (including imaging, microbiology, ancillary and laboratory) are listed below for reference.    Significant Diagnostic Studies: Dg Ribs Unilateral W/chest Left  01/22/2014   CLINICAL DATA:  Trip and fall at 4 p.m. Left-sided numbness and  weakness. Left lower rib pain.  EXAM: LEFT RIBS AND CHEST - 3+ VIEW  COMPARISON:  04/25/2013  FINDINGS: No pneumothorax or pleural effusion. Atherosclerotic aortic arch. Old healed right rib fractures. Bony demineralization. Left lower lateral rib irregularities favor nondisplaced fractures. These involve the left tenth and eleventh ribs and potentially the left seventh and ninth ribs.  IMPRESSION: 1. Nondisplaced left lower rib fractures laterally. 2. Old right rib fractures. 3. No pneumothorax or pleural effusion.   Electronically Signed   By: Sherryl Barters M.D.   On: 01/22/2014 21:22   Dg Thoracic Spine 2 View  01/22/2014   CLINICAL DATA:  Left-sided numbness and weakness after tripping and falling at home.  EXAM: THORACIC SPINE - 2 VIEW  COMPARISON:  CT scan of November 28, 2013.  FINDINGS: No acute fracture or significant spondylolisthesis is noted. Mild anterior osteophyte formation is noted throughout the thoracic spine. Diffuse osteopenia is noted.  IMPRESSION: No acute abnormality seen in the thoracic spine.   Electronically Signed   By: Sabino Dick M.D.   On: 01/22/2014 21:22   Ct Head Wo Contrast  01/22/2014   CLINICAL DATA:  Fall at home around 4 p.m. 4 cm contusion along  the left occiput. Anxiety. Pre-existing left-sided numbness and weakness reportedly contributed to fall.  EXAM: CT HEAD WITHOUT CONTRAST  TECHNIQUE: Contiguous axial images were obtained from the base of the skull through the vertex without intravenous contrast.  COMPARISON:  11/28/2013  FINDINGS: The brainstem, cerebellum, cerebral peduncles, thalamus, basal ganglia, basilar cisterns, and ventricular system appear within normal limits. Periventricular white matter and corona radiata hypodensities favor chronic ischemic microvascular white matter disease. No intracranial hemorrhage, mass lesion, or acute CVA.  IMPRESSION: 1. No acute intracranial findings. 2. Periventricular white matter and corona radiata hypodensities favor  chronic ischemic microvascular white matter disease.   Electronically Signed   By: Sherryl Barters M.D.   On: 01/22/2014 19:20   Ct Chest Wo Contrast  01/23/2014   CLINICAL DATA:  Fall with left-sided chest wall pain  EXAM: CT CHEST WITHOUT CONTRAST  TECHNIQUE: Multidetector CT imaging of the chest was performed following the standard protocol without IV contrast.  COMPARISON:  11/28/2013  FINDINGS: THORACIC INLET/BODY WALL:  No acute abnormality.  MEDIASTINUM:  Normal heart size. No pericardial effusion. Scattered atherosclerosis, typical for age. No acute vascular abnormality. No adenopathy.  LUNG WINDOWS:  No consolidation. No hemothorax or pneumothorax. No suspicious pulmonary nodule (a right apical pulmonary nodule on image 13 is size stable since 2013 at least.  UPPER ABDOMEN:  No acute findings.  Peripelvic cyst on the left.  OSSEOUS:  Previously described left side of rib fractures are not clearly visualized by CT. No displaced rib fractures identified. Mild T11 vertebral body wedging which is chronic. Remote ninth lateral rib fracture.  IMPRESSION: No displaced rib fracture or intrathoracic injury. Rib fractures described on preceding radiography are not clearly visualized.   Electronically Signed   By: Jorje Guild M.D.   On: 01/23/2014 01:34   Ct Cervical Spine Wo Contrast  01/22/2014   CLINICAL DATA:  Patient tripped and fell at home. Denies loss of consciousness.  EXAM: CT CERVICAL SPINE WITHOUT CONTRAST  TECHNIQUE: Multidetector CT imaging of the cervical spine was performed without intravenous contrast. Multiplanar CT image reconstructions were also generated.  COMPARISON:  CT scan of November 28, 2013.  FINDINGS: No fracture or significant spondylolisthesis is noted. Mild degenerative disc disease is noted at C3-4, C5-6 and C6-7 with mild anterior osteophyte formation. Mild degenerative changes are seen involving the posterior facet joints bilaterally. Visualized portions of upper lung  fields appear normal.  IMPRESSION: Multilevel degenerative disc disease. No acute abnormality seen in the cervical spine.   Electronically Signed   By: Sabino Dick M.D.   On: 01/22/2014 19:59    Microbiology: Recent Results (from the past 240 hour(s))  Urine culture     Status: None   Collection Time: 01/22/14  7:30 PM  Result Value Ref Range Status   Specimen Description URINE, CLEAN CATCH  Final   Special Requests NONE  Final   Culture  Setup Time   Final    01/23/2014 03:51 Performed at [place]   Final    >=100,000 COLONIES/ML Performed at [place] Performed at [facility]    Report Status 01/25/2014 FINAL  Final   Organism ID, Bacteria KLEBSIELLA PNEUMONIAE  Final      Susceptibility   Klebsiella pneumoniae - MIC*    AMPICILLIN >=32 RESISTANT Resistant     CEFAZOLIN <=4 SENSITIVE Sensitive     CEFTRIAXONE <=1 SENSITIVE  Sensitive     CIPROFLOXACIN <=0.25 SENSITIVE Sensitive     GENTAMICIN <=1 SENSITIVE Sensitive     LEVOFLOXACIN <=0.12 SENSITIVE Sensitive     NITROFURANTOIN 64 INTERMEDIATE Intermediate     TOBRAMYCIN <=1 SENSITIVE Sensitive     TRIMETH/SULFA <=20 SENSITIVE Sensitive     PIP/TAZO <=4 SENSITIVE Sensitive     * KLEBSIELLA PNEUMONIAE  Clostridium Difficile by PCR     Status: Abnormal   Collection Time: 01/24/14 12:53 PM  Result Value Ref Range Status   C difficile by pcr POSITIVE (A) NEGATIVE Final    Comment: CRITICAL RESULT CALLED TO, READ BACK BY AND VERIFIED WITH: D. SCOTT RN 14:15 01/24/14 ([email or web address])      Labs: Basic Metabolic Panel:  Recent Labs Lab 01/22/14 1932 01/22/14 2012 01/23/14 0511 01/25/14 0540  NA 141 141 142 141  K 3.1* 3.3* 3.6* 3.7  CL 101 102 104 104  CO2  --  28 29 29   GLUCOSE 83 79 105* 95  BUN 17 17 14 19   CREATININE 0.50 0.50 0.40* 0.48*  CALCIUM  --  9.1 9.0 8.9   Liver Function Tests:  Recent Labs Lab 01/23/14 0511   AST 15  ALT 10  ALKPHOS 62  BILITOT 0.7  PROT 5.3*  ALBUMIN 2.9*   No results for input(s): LIPASE, AMYLASE in the last 168 hours. No results for input(s): AMMONIA in the last 168 hours. CBC:  Recent Labs Lab 01/22/14 1932 01/22/14 2012 01/23/14 0511  WBC  --  6.7 4.1  NEUTROABS  --  5.7 3.3  HGB 12.9 11.5* 10.9*  HCT 38.0 35.5* 34.0*  MCV  --  96.7 96.6  PLT  --  168 176   Cardiac Enzymes: No results for input(s): CKTOTAL, CKMB, CKMBINDEX, TROPONINI in the last 168 hours. BNP: BNP (last 3 results) No results for input(s): PROBNP in the last 8760 hours. CBG:  Recent Labs Lab 01/22/14 1942  GLUCAP 47       Signed:  Devetta Hagenow L  [facility] 01/25/2014, 12:41 PM

## 2014-01-25 NOTE — Progress Notes (Signed)
Physical Therapy Treatment Patient Details Name: Bethany [name] MRN: [id number] DOB: [birth year] Today's Date: 01/25/2014    History of Present Illness HPI: Bethany [name] is a 80 y.o. female with a history of pulmonary embolism, rectal cancer, atrial fibrillation, hypothyroidism was brought to the ER after patient had a fall. As per patient's nephew patient was recently discharged from rehabilitation 5 days ago. Patient was in the rehabilitation after was diagnosed with pulmonary embolism. Patient was trying to walk to her commode when suddenly she lost balance and fell and hit her head. Patient did not lose consciousness. In the ER initially chest x-ray was showing possible fractures of the ribs but CAT scan chest did not show any fractures. CT head was negative for anything acute.    PT Comments    Patient experiencing extreme anxiety upon P.T. arrival this afternoon, immediately requesting not to work with P.T. since she was getting ready to transfer to SNF today.  Educated pt on importance of moving out of bed while still in the hospital and she was agreeable to try sitting EOB.  She was able to sit EOB with no UE support and without assistance for roughly 20 seconds before reaching for support.  Pt with complaints of LLE numbness and pain this session.  For hip flexion and knee extension: RLE at least 3/5, LLE 1/5.  Pt would continue to benefit from skilled therapy to address weakness and balance deficits.       Follow Up Recommendations  SNF;Supervision/Assistance - 24 hour     Equipment Recommendations  Wheelchair (measurements PT);Wheelchair cushion (measurements PT)    Recommendations for Other Services       Precautions / Restrictions Precautions Precautions: Fall Restrictions Weight Bearing Restrictions: No    Mobility  Bed Mobility Overal bed mobility: Needs Assistance Bed Mobility: Supine to Sit;Sit to Supine     Supine to sit: Min assist;HOB elevated Sit to supine: Min  assist   General bed mobility comments: Pt able to move legs off side of bed with use of arms to help and with increased time.  Min assist from therapist to elevate trunk to seated.  Verbal cues for hand placement and sequencing.  Transfers                 General transfer comment: Did not attempt due to extreme anxiety this session  Ambulation/Gait                 Stairs            Wheelchair Mobility    Modified Rankin (Stroke Patients Only)       Balance Overall balance assessment: Needs assistance Sitting-balance support: No upper extremity supported;Feet supported Sitting balance-Leahy Scale: Fair                              Cognition Arousal/Alertness: Awake/alert Behavior During Therapy: WFL for tasks assessed/performed Overall Cognitive Status: Within Functional Limits for tasks assessed                      Exercises General Exercises - Lower Extremity Long Arc Quad: AROM;PROM;Seated;Both;10 reps (AROM right; PROM left) Hip Flexion/Marching: PROM;AROM;Seated;Both;5 reps (AROM right; PROM left)    General Comments        Pertinent Vitals/Pain Pain Assessment: Faces Faces Pain Scale: Hurts even more Pain Location: Left shoulder and neck; occasional pain in LLE Pain Intervention(s): Limited activity within  patient's tolerance;Monitored during session    Home Living                      Prior Function            PT Goals (current goals can now be found in the care plan section) Progress towards PT goals: Progressing toward goals    Frequency  Min 3X/week    PT Plan Current plan remains appropriate    Co-evaluation             End of Session   Activity Tolerance:  (Patient limited by anxiety) Patient left: in bed;with call bell/phone within reach     Time: 1359-1417 PT Time Calculation (min) (ACUTE ONLY): 18 min  Charges:                       G Codes:      Demarquis Osley  SPT 01/25/2014, 4:32 PM

## 2014-01-26 NOTE — Clinical Social Work Psychosocial (Signed)
Clinical Social Work Department BRIEF PSYCHOSOCIAL ASSESSMENT 01/26/2014  Patient:  Bethany [name], Bethany [name]     Account Number:  [id number]     Admit date:  01/22/2014  Clinical Social Worker:  Wylene Men  Date/Time:  01/24/2014 08:49 AM  Referred by:  Physician  Date Referred:  01/24/2014 Referred for  SNF Placement  Psychosocial assessment   Other Referral:   none   Interview type:  Other - See comment Other interview type:   nephew, Juleen China and patient    PSYCHOSOCIAL DATA Living Status:  FAMILY Admitted from facility:   Level of care:   Primary support name:  Juleen China Primary support relationship to patient:  FAMILY Degree of support available:   Juleen China is nephew.  strong support provided    CURRENT CONCERNS Current Concerns  Post-Acute Placement   Other Concerns:    SOCIAL WORK ASSESSMENT / PLAN CSW assessed patient at bedside.  Patient is alert and oriented x4.  Patient very tearful reporting her body hurts.  CSW provided support and advised RN of concern. Patient is aware of PT recommendation of STR/SNF.  Patient reports having been to [facility] in the past and does not wish to receice STR from that facility after dc.  Patient wishes to have her clinical information/referral sent to [facility].  Patient states she had a friend who received STR at [facility] and was satisfied.  Patient's first choice is [facility].  Patient requests CSW to contact nephew and provide update.  CSW completed this request. Patient was accepted to [facility] and will receive a private room due to isolation precautions.  Pt will be transported via [facility] at time of dc.  All parties updated/agreeable to plan of dc.   Assessment/plan status:  Psychosocial Support/Ongoing Assessment of Needs Other assessment/ plan:   FL2  PASARR-existing   Information/referral to community resources:   SNF/STR    PATIENT'S/FAMILY'S RESPONSE TO PLAN OF CARE: Patient is agreeable to SNF/STR.  Patient first  choice is [facility] and will be transported via [facility] at time of dc.       Nonnie Done, [place] [phone]  Psychiatric & Orthopedics (5N 1-16) Clinical Social Worker

## 2014-01-26 NOTE — Clinical Social Work Placement (Signed)
Clinical Social Work Department CLINICAL SOCIAL WORK PLACEMENT NOTE 01/26/2014  Patient:  Bethany [name], Bethany [name]  Account Number:  [id number] Admit date:  01/22/2014  Clinical Social Worker:  Wylene Men  Date/time:  01/24/2014 08:53 AM  Clinical Social Work is seeking post-discharge placement for this patient at the following level of care:   SKILLED NURSING   (*CSW will update this form in Epic as items are completed)   01/24/2014  Patient/family provided with [place] Department of Clinical Social Work's list of facilities offering this level of care within the geographic area requested by the patient (or if unable, by the patient's family).  01/24/2014  Patient/family informed of their freedom to choose among providers that offer the needed level of care, that participate in Medicare, Medicaid or managed care program needed by the patient, have an available bed and are willing to accept the patient.  01/24/2014  Patient/family informed of [facility]' ownership interest in [facility], as well as of the fact that they are under no obligation to receive care at this facility.  PASARR submitted to EDS on existing PASARR number received on   FL2 transmitted to all facilities in geographic area requested by pt/family on  01/24/2014 FL2 transmitted to all facilities within larger geographic area on   Patient informed that his/her managed care company has contracts with or will negotiate with  certain facilities, including the following:     Patient/family informed of bed offers received:  01/25/2014 Patient chooses bed at [facility] at [facility] Physician recommends and patient chooses bed at    Patient to be transferred to [facility] at [facility] on  01/25/2014 Patient to be transferred to facility by PTAR Patient and family notified of transfer on 01/25/2014 Name of family member notified:  Juleen China- nephew  The following physician request were entered in  Epic:   Additional Comments: PASARR was existing

## 2014-02-19 ENCOUNTER — Telehealth: Payer: Self-pay | Admitting: *Deleted

## 2014-02-19 NOTE — Telephone Encounter (Signed)
Called and spoke with Ms. Lacomb's family representative, Gerda Diss, who was informed, per Dr. Arloa Koh, that Bethany [name] needs to schedule an appointment with her surgeon Dr. Michael Boston, as soon as possible, followed by an appointment with Dr. Zola Button.  He stated understanding.  Will follow-up.

## 2014-02-20 ENCOUNTER — Other Ambulatory Visit: Payer: Self-pay | Admitting: Oncology

## 2014-02-20 ENCOUNTER — Encounter: Payer: Self-pay | Admitting: *Deleted

## 2014-02-20 ENCOUNTER — Telehealth: Payer: Self-pay | Admitting: Oncology

## 2014-02-20 DIAGNOSIS — C20 Malignant neoplasm of rectum: Secondary | ICD-10-CM

## 2014-02-20 NOTE — Telephone Encounter (Signed)
Confirm appt for 01/22.

## 2014-03-02 ENCOUNTER — Encounter: Payer: Self-pay | Admitting: Oncology

## 2014-03-02 ENCOUNTER — Ambulatory Visit (HOSPITAL_BASED_OUTPATIENT_CLINIC_OR_DEPARTMENT_OTHER): Payer: Medicare Other | Admitting: Oncology

## 2014-03-02 ENCOUNTER — Ambulatory Visit (HOSPITAL_COMMUNITY)
Admission: RE | Admit: 2014-03-02 | Discharge: 2014-03-02 | Disposition: A | Discharge status: Home / Self Care | Payer: Medicare Other | Source: Ambulatory Visit | Attending: Oncology | Admitting: Oncology

## 2014-03-02 ENCOUNTER — Other Ambulatory Visit: Payer: Self-pay | Admitting: Oncology

## 2014-03-02 ENCOUNTER — Telehealth: Payer: Self-pay | Admitting: Oncology

## 2014-03-02 VITALS — BP 131/67 | HR 100 | Temp 98.0°F | Resp 18 | Wt 113.2 lb

## 2014-03-02 DIAGNOSIS — C20 Malignant neoplasm of rectum: Secondary | ICD-10-CM

## 2014-03-02 DIAGNOSIS — Z86718 Personal history of other venous thrombosis and embolism: Secondary | ICD-10-CM

## 2014-03-02 DIAGNOSIS — K769 Liver disease, unspecified: Secondary | ICD-10-CM

## 2014-03-02 LAB — GLUCOSE, CAPILLARY: GLUCOSE-CAPILLARY: 108 mg/dL — AB (ref 70–99)

## 2014-03-02 MED ORDER — FLUDEOXYGLUCOSE F - 18 (FDG) INJECTION
5.6400 | Freq: Once | INTRAVENOUS | Status: AC | PRN
  Administered 2014-03-02: 5.64 via INTRAVENOUS

## 2014-03-02 NOTE — Telephone Encounter (Signed)
Gave avs & calendar for March. °

## 2014-03-02 NOTE — Progress Notes (Signed)
Hematology and Oncology Follow Up Visit  SHANDA CADOTTE [id number] [birth year] 81 y.o. 03/02/2014 12:44 PM [name], MDKim, [name], MD   Principle Diagnosis: 81 year old woman with rectal adenocarcinoma diagnosed in August of 2015. Her staging by EUS his T3 N0. MRI of the liver showed potential hepatic metastasis.  Past therapy: Radiation therapy concomitantly with oral Xeloda at 500 mg twice a day started on 10/25/2013.  Current therapy: Supportive care only.  Interim History:  Ms. Grose presents today for a followup visit. Since the last visit, she completed radiation therapy with Xeloda and October 2015. Her course was complicated by prolonged hospitalization initially for a pulmonary embolism, septic shock and C. difficile colitis. She was discharged to rehabilitation. She was hospitalized again in December 2015 after she sustained a fall. She was discharged on December 17 to a skilled nursing facility for rehabilitation. She was subsequently discharged home where she has been recently. Since she has been home, she still rather debilitated and recovering very slowly. She has lost a lot of weight related to her C. difficile colitis and her appetite is improving slowly. She is ambulatory using a walker for very short distances inside her house. She still has a left lower extremity weakness that is slightly improving. She does not report any specific symptoms of chest pain or bone pain. She does not report any pelvic pain or any neurological symptoms. Her quality of life very limited but stable at this time.  She has not had any headaches or blurry vision. She does not report any syncope or seizures. She does not report any chest pain, shortness of breath, cough or hemoptysis. She does not report any palpitation, orthopnea or PND. She does not report any leg edema. She denies abdominal pain, constipation or obstruction. She does not report any frequency urgency or hesitancy. She is not abort any skeletal  complaints. Rest of the review of systems unremarkable.     Medications: I have reviewed the patient's current medications.  Current Outpatient Prescriptions  Medication Sig Dispense Refill  . acetaminophen (TYLENOL) 325 MG tablet Take 2 tablets (650 mg total) by mouth every 6 (six) hours as needed for mild pain (or Fever >/= 101).    . Amino Acids-Protein Hydrolys (FEEDING SUPPLEMENT, PRO-STAT SUGAR FREE 64,) LIQD Take 30 mLs by mouth 2 (two) times daily between meals. 900 mL 0  . cholecalciferol (VITAMIN D) 1000 UNITS tablet Take 2,000 Units by mouth daily.    . feeding supplement, RESOURCE BREEZE, (RESOURCE BREEZE) LIQD Take 1 Container by mouth 2 (two) times daily between meals.  0  . levothyroxine (SYNTHROID, LEVOTHROID) 25 MCG tablet Take 25 mcg by mouth See admin instructions. 25 mcg  on Monday, Wednesday, Friday and Sunday.    . levothyroxine (SYNTHROID, LEVOTHROID) 50 MCG tablet Take 75 mcg by mouth daily before breakfast. Tuesday Thursday and Saturday    . metroNIDAZOLE (FLAGYL) 500 MG tablet Take 1 tablet (500 mg total) by mouth every 8 (eight) hours. Through 02/03/14    . ondansetron (ZOFRAN) 8 MG tablet Take 1 tablet (8 mg total) by mouth every 8 (eight) hours as needed for nausea or vomiting. 20 tablet 1  . potassium chloride SA (K-DUR,KLOR-CON) 20 MEQ tablet Take 20 mEq by mouth daily.    . rivaroxaban (XARELTO) 20 MG TABS tablet Take 1 tablet (20 mg total) by mouth daily with supper. 30 tablet 0   No current facility-administered medications for this visit.     Allergies:  Allergies  Allergen  Reactions  . Cataflam [Diclofenac] Other (See Comments)    Unknown; patient is unaware of allergy.  . Cephalosporins Shortness Of Breath  . Erythromycin Shortness Of Breath  . Keflex [Cephalexin] Shortness Of Breath  . Codeine Nausea And Vomiting  . Darvocet [Propoxyphene N-Acetaminophen] Other (See Comments)    Unknown; patient is unaware of allergy  . Naproxen Other (See  Comments)    Unknown; patient is unaware of allergy  . Penicillins Other (See Comments)    Patient states that she CAN take this; unaware of allergy  . Synalgos-Dc [Aspirin-Caff-Dihydrocodeine] Other (See Comments)    Unknown; patient is unaware of allergy  . Ultram [Tramadol] Other (See Comments)    Unknown; patient is unaware of allergy    Past Medical History, Surgical history, Social history, and Family History were reviewed and updated.   Physical Exam: Blood pressure 131/67, pulse 100, temperature 98 F (36.7 C), temperature source Oral, resp. rate 18, weight 113 lb 3.2 oz (51.347 kg), SpO2 100 %. ECOG: 2 General appearance: alert and cooperative. Chronically ill-appearing slightly cachectic. Sitting in a wheelchair today. Head: Normocephalic, without obvious abnormality Neck: no adenopathy Lymph nodes: Cervical, supraclavicular, and axillary nodes normal. Heart:regular rate and rhythm, S1, S2 normal, no murmur, click, rub or gallop Lung:chest clear, no wheezing, rales, normal symmetric air entry. Abdomen: soft, non-tender, without masses or organomegaly EXT:no erythema, induration, or nodules.  Neurological: No gross deficits but generalized weakness. Skin showed no rashes or lesions.   Lab Results: Lab Results  Component Value Date   WBC 4.1 01/23/2014   HGB 10.9* 01/23/2014   HCT 34.0* 01/23/2014   MCV 96.6 01/23/2014   PLT 176 01/23/2014     Chemistry      Component Value Date/Time   NA 141 01/25/2014 0540   NA 140 11/23/2013 1426   K 3.7 01/25/2014 0540   K 4.0 11/23/2013 1426   CL 104 01/25/2014 0540   CO2 29 01/25/2014 0540   CO2 28 11/23/2013 1426   BUN 19 01/25/2014 0540   BUN 16.5 11/23/2013 1426   CREATININE 0.48* 01/25/2014 0540   CREATININE 0.7 11/23/2013 1426      Component Value Date/Time   CALCIUM 8.9 01/25/2014 0540   CALCIUM 9.6 11/23/2013 1426   ALKPHOS 62 01/23/2014 0511   ALKPHOS 110 11/23/2013 1426   AST 15 01/23/2014 0511   AST  32 11/23/2013 1426   ALT 10 01/23/2014 0511   ALT 52 11/23/2013 1426   BILITOT 0.7 01/23/2014 0511   BILITOT 1.01 11/23/2013 1426     EXAM: NUCLEAR MEDICINE PET SKULL BASE TO THIGH  TECHNIQUE: 5.6 mCi F-18 FDG was injected intravenously. Full-ring PET imaging was performed from the skull base to thigh after the radiotracer. CT data was obtained and used for attenuation correction and anatomic localization.  FASTING BLOOD GLUCOSE: Value: 108 mg/dl  COMPARISON: Multiple exams, including 01/22/2014  FINDINGS: NECK  No hypermetabolic lymph nodes in the neck. Glottic activity is felt to be physiologic.  CHEST  No findings of malignancy in the chest.  ABDOMEN/PELVIS  Right hepatic lobe lesion adjacent to the IVC, maximum standard uptake value 6.0, similar size to prior MRI. No additional liver lesion observed. Focal hypermetabolic activity in the left side of the rectum, maximum standard uptake value 6.4, favoring rectal cancer. Generalized white rectal wall thickening probably a result of radiation therapy. Low-grade presacral stranding noted. Right adnexal mass with some complex a calcific elements, but without hypermetabolic activity. Aortoiliac atherosclerotic vascular disease.  SKELETON  No focal hypermetabolic activity to suggest skeletal metastasis.  IMPRESSION: 1. Solitary right hepatic lobe lesion is hypermetabolic and similar in size to the prior MRI from 10/24/2013, compatible with metastatic lesion. 2. Solitary nodular focus of hypermetabolic activity in the rectum eccentric to the left, suspicious for primary neoplasm. 3. Rectal wall thickening in general, with presacral edema, likely due to prior radiation therapy. 4. 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  Impression and Plan:  81 year old woman with the following issues:   1. Rectal cancer presented with  hematochezia and status post colonoscopy and EUS. The care of by EUS criteria was 4 cm in length around 4-5 cm from the anal verge. The staging was T3 N0 M1 with possible liver metastasis. She is status post radiation therapy with oral Xeloda with still residual tumor as evident by the PET scan results. The PET scan findings were discussed with the reviewing radiologist as well as the patient and her family. It does appear that she does not have widely metastatic disease at this time still have some residual activity which to be expected.  Ideally surgical resection would be the next step but I question her ability to handle surgery at this time given her debilitated state. She is certainly recovering slowly but I'm not sure she will be a surgical candidate soon. She is scheduled to meet with Dr. Johney Maine in the near future to discuss surgical options.    2. 19 mm lesion in the right hepatic lobe. This could represent metastatic disease from her rectal cancer. Her PET scan images did not show any additional findings and certainly appears to be PET avid. I do not think she is a candidate for hepatic resection but certainly could be a candidate for liver directed therapy and possible ablation. I will refer her to interventional radiology for an evaluation regarding this issue.  3. History of pulmonary embolism: She is on full dose anticoagulation with Xarelto. She needs to be on lifetime anticoagulation as she developed another blood clot off of it.  4. Follow-up: Will be in the next 4-5 weeks to assess her progress.  Zola Button, MD 1/22/201612:44 PM

## 2014-03-13 ENCOUNTER — Encounter (INDEPENDENT_AMBULATORY_CARE_PROVIDER_SITE_OTHER): Payer: Self-pay | Admitting: Surgery

## 2014-03-13 ENCOUNTER — Other Ambulatory Visit (INDEPENDENT_AMBULATORY_CARE_PROVIDER_SITE_OTHER): Payer: Self-pay | Admitting: Surgery

## 2014-03-13 DIAGNOSIS — F419 Anxiety disorder, unspecified: Secondary | ICD-10-CM | POA: Insufficient documentation

## 2014-03-13 DIAGNOSIS — F329 Major depressive disorder, single episode, unspecified: Secondary | ICD-10-CM | POA: Insufficient documentation

## 2014-03-13 NOTE — H&P (Addendum)
Bethany [name] 03/13/2014 11:26 AM Location: [facility] Patient #: [id number] DOB: 05-31-1928 Single / Language: [name] / Race: White Female History of Present Illness Adin Hector MD; 03/13/2014 6:38 PM) Patient words: recal reck.  The patient is a 81 year old female who presents with colorectal cancer. Patient comes in today anxious and a little down hearted. Patient diagnosed with rectal cancer back in August.. Underwent neoadjuvant chemoradiation therapy. Repleted radiation therapy but missed the last pills of her Xeloda due to worsening diarrhea.. Diarrhea issues. Admitted. Develop C. difficile. Develop pulmonary embolism. Anticoagulated. Went to a nursing facility for rehabilitation. [facility] in mid December. Negative for fracture. Persistent C. difficile. Given Flagyl. UTI. Treated. Eventually transition home. She lost a lot of weight. She is gained 8 pounds back now. She walks with a walker. Feels some numbness in her left anterior thigh. Concern for weakness as well. Scared that she will fall. Scared to get up. Eventually is reassured. She saw medical oncology. No change in size of solitary liver lesion on right. He eating okay. Trying to move her bowels. No nausea or vomiting. Allergies (Sonya Bynum, CMA; 03/13/2014 11:27 AM) Diclofenac *ANALGESICS - ANTI-INFLAMMATORY* Cephalothin in D5W *CEPHALOSPORINS* Shortness of breath. Erythromycin Stearate *MACROLIDES* Shortness of breath. Keflex *CEPHALOSPORINS* Shortness of breath. Codeine Sulfate *ANALGESICS - OPIOID* Nausea, Vomiting. Darvocet A500 *ANALGESICS - OPIOID* Naproxen *ANALGESICS - ANTI-INFLAMMATORY* Penicillamine *ASSORTED CLASSES* Synalgos DC *ANALGESICS - OPIOID* TraMADol HCl *ANALGESICS - OPIOID*  Medication History (Alisha Spillers, MA; 03/13/2014 2:35 PM) Aspirin EC (81MG  Tablet DR, Oral daily) Active. Levothyroxine Sodium (25MCG Tablet, Oral Mon,Wed,Fri, and Sun)  Active. Levothyroxine Sodium (75MCG Tablet, Oral Tues, Thurs and Sat) Active. Vitamin D (1000UNIT Tablet, Oral two times daily) Active. Xarelto (10MG  Tablet, Oral) Active. Medications Reconciled    Vitals (Sonya Bynum CMA; 03/13/2014 11:26 AM) 03/13/2014 11:26 AM Weight: 119 lb Height: 65in Body Surface Area: 1.57 m Body Mass Index: 19.8 kg/m Temp.: 97.39F(Temporal)  Pulse: 72 (Regular)  BP: 142/74 (Sitting, Left Arm, Standard)     Physical Exam Adin Hector MD; 03/13/2014 6:38 PM)  General Mental Status-Alert. General Appearance-Not in acute distress, Not Sickly. Orientation-Oriented X3. Hydration-Well hydrated. Voice-Normal.  Integumentary Global Assessment Upon inspection and palpation of skin surfaces of the - Axillae: non-tender, no inflammation or ulceration, no drainage. and Distribution of scalp and body hair is normal. General Characteristics Temperature - normal warmth is noted.  Head and Neck Head-normocephalic, atraumatic with no lesions or palpable masses. Face Global Assessment - atraumatic, no absence of expression. Neck Global Assessment - no abnormal movements, no bruit auscultated on the right, no bruit auscultated on the left, no decreased range of motion, non-tender. Trachea-midline. Thyroid Gland Characteristics - non-tender.  Eye Eyeball - Left-Extraocular movements intact, No Nystagmus. Eyeball - Right-Extraocular movements intact, No Nystagmus. Cornea - Left-No Hazy. Cornea - Right-No Hazy. Sclera/Conjunctiva - Left-No scleral icterus, No Discharge. Sclera/Conjunctiva - Right-No scleral icterus, No Discharge. Pupil - Left-Direct reaction to light normal. Pupil - Right-Direct reaction to light normal.  ENMT Ears Pinna - Left - no drainage observed, no generalized tenderness observed. Right - no drainage observed, no generalized tenderness observed. Nose and Sinuses External Inspection of the  Nose - no destructive lesion observed. Inspection of the nares - Left - quiet respiration. Right - quiet respiration. Mouth and Throat Lips - Upper Lip - no fissures observed, no pallor noted. Lower Lip - no fissures observed, no pallor noted. Nasopharynx - no discharge present. Oral Cavity/Oropharynx - Tongue - no dryness  observed. Oral Mucosa - no cyanosis observed. Hypopharynx - no evidence of airway distress observed. Note:Very tremulous speech. Note: Still has rather tremulous voice.   Chest and Lung Exam Inspection Movements - Normal and Symmetrical. Accessory muscles - No use of accessory muscles in breathing. Palpation Palpation of the chest reveals - Non-tender. Auscultation Breath sounds - Normal and Clear.  Cardiovascular Auscultation Rhythm - Regular. Murmurs & Other Heart Sounds - Auscultation of the heart reveals - No Murmurs and No Systolic Clicks.  Abdomen Inspection Inspection of the abdomen reveals - No Visible peristalsis and No Abnormal pulsations. Umbilicus - No Bleeding, No Urine drainage. Palpation/Percussion Palpation and Percussion of the abdomen reveal - Soft, Non Tender, No Rebound tenderness, No Rigidity (guarding) and No Cutaneous hyperesthesia.  Female Genitourinary Sexual Maturity Tanner 5 - Adult hair pattern. Note: No vaginal bleeding nor discharge   Rectal Note: Good hygiene. Minimal soilage. A few small anterior external hemorrhoid tags. Normal sphincter tone. Sphincter intact.   Circumferential nearly obstructing mass in the mid rectum. More mobile posteriorly but bulky 8 cm from anal verge. Caliber barely allows finger to pass. Rectovaginal septum intact on vaginal side. Sensitive   Peripheral Vascular Upper Extremity Inspection - Left - No Cyanotic nailbeds, Not Ischemic. Right - No Cyanotic nailbeds, Not Ischemic.  Neurologic Neurologic evaluation reveals -normal attention span and ability to concentrate, able to name  objects and repeat phrases. Appropriate fund of knowledge , normal sensation and normal coordination. Mental Status Affect - not angry, not paranoid. Cranial Nerves-Normal Bilaterally. Gait-Normal.  Neuropsychiatric Mental status exam performed with findings of-thought content normal with ability to perform basic computations and apply abstract reasoning and no evidence of hallucinations, delusions, obsessions or homicidal/suicidal ideation. The patient's mood and affect are described as -anxious, not angry. Judgment and Insight-insight is appropriate concerning matters relevant to self. Speech -Note:Tremulous/warbled.  Note: Very anxious. Ultimately consolable.   Musculoskeletal Global Assessment Spine, Ribs and Pelvis - no instability, subluxation or laxity. Right Upper Extremity - no instability, subluxation or laxity.  Lymphatic Head & Neck  General Head & Neck Lymphatics: Bilateral - Description - No Localized lymphadenopathy. Axillary  General Axillary Region: Bilateral - Description - No Localized lymphadenopathy. Femoral & Inguinal  Generalized Femoral & Inguinal Lymphatics: Left - Description - No Localized lymphadenopathy. Right - Description - No Localized lymphadenopathy.    Assessment & Plan Adin Hector MD; 03/13/2014 6:47 PM)  RECTAL CANCER METASTASIZED TO LIVER (154.1  C20) Impression: I had long discussion with the patient & her nephew concerning the pathophysiology of rectal cancer.  Standard of care would be low anterior resection 8-12 weeks after neoadj chemotherapy that would've been late December to mid January. She is past that ideal window. She is very deconditioned and anxious. I worry about her ability to tolerate an operation. However I worry about her lack of good pathological response. I think it is smaller now but still nearly obstructing. She is fully anticoagulated. Risk of bleeding as well. I think her risk of obstruction or  bleeding is evident. Would benefit from palliative resection. I am worried that diverting colostomy not be adequate. Probably not a massive increase risk to proceed with low anterior resection as long as not too long. However, I think she is too deconditioned and weak to attempt anastomosis. I would give her a colostomy. d/w Dr Leighton Ruff whom agrees. . She's already had severe diarrhea issues with her Xeloda. I worry that she cannot tolerate an ileostomy. Patient relented. She  wishes to be aggressive and proceed with surgery. She does not want to risk obstruction or worsening symptoms. I cautioned her that her risks of death and perioperative complications are higher now. She was considered low cardiac risk in September. Would like their opinion again. However if we can do this in a minimally invasive approach and do a palliative resection, avoid less pain and wound issues. Try not to keep her on the table too long even a minimally invasive would at some time. She wishes to be aggressive and proceed with surgery. I would like her to get stronger first. If she can do that, then proceed with surgery and a month or so. I noted if she will not get upper be active and are not eat, I am worried about putting her through distress of an operation. She promised to do that.  She does not want to die from obstruction or pelvic pain.  She would like to take the risk and proceed with surgery.  I think she is rather depressed and anxious. A recommended she restart Zoloft. She did not want to at first but her nephew agreed  Current Plans Instructions:  If you get stronger, we will consider minimally invasive low anterior resection for rectal cancer with permanent colostomy.  Would like cardiologist to make sure it is safe to come off throughout so and no other issues. Have him recheck since cleared in September.  Tentatively plan in late March/April to allow you to have time to recover from recent hospitalizations  and be further out from episode of pulmonary embolism. Schedule for Surgery The anatomy & physiology of the digestive tract was discussed. The pathophysiology of the rectal pathology was discussed. Natural history risks without surgery was discussed. I worked to give an overview of the disease and the frequent need to have multispecialty involvement. I feel the risks of no intervention will lead to serious problems that outweigh the operative risks; therefore, I recommended a partial proctocolectomy to remove the pathology. Minimally Invasive (Robotic/Laparoscopic) & open techniques were discussed. We will work to preserve anal & pelvic floor function without sacrificing cure.  Risks such as bleeding, infection, abscess, leak, reoperation, possible temporary or permanent ostomy, hernia, heart attack, death, and other risks were discussed. I noted a good likelihood this will help address the problem. Goals of post-operative recovery were discussed as well. We will work to minimize complications. Educational information was available as well. Questions were answered. The patient expresses understanding & wishes to proceed with surgery. Pt Education - [facility] Ostomy HCI (Monserratt Knezevic) Pt Education - [facility] Abdominal Surgery (Taaj Hurlbut) Pt Education - [place] (Astha Probasco) Pt Education - [facility] Pelvic Floor Exercises (Kegels) and Dysfunction HCI (Roiza Wiedel) Pt Education - [facility] Colon Bowel Prep 2015 Miralax/Antibiotics Started Neomycin Sulfate 500MG , 2 (two) Tablet SEE NOTE, #6, 03/13/2014, No Refill. Local Order: TAKE TWO TABLETS AT 2 PM, 3 PM, AND 10 PM THE DAY PRIOR TO SURGERY Started Flagyl 500MG , 2 (two) Tablet SEE NOTE, #6, 03/13/2014, No Refill. Local Order: Take at 2pm, 3pm, and 10pm the day prior to your colon operation  Adin Hector, M.D., F.A.C.S. Gastrointestinal and Minimally Invasive Surgery [facility] [address] [place], [place] [address] [phone] Main /  Paging

## 2014-03-15 ENCOUNTER — Ambulatory Visit (INDEPENDENT_AMBULATORY_CARE_PROVIDER_SITE_OTHER): Payer: Medicare Other | Admitting: Neurology

## 2014-03-15 ENCOUNTER — Encounter: Payer: Self-pay | Admitting: Neurology

## 2014-03-15 VITALS — BP 151/83 | HR 117 | Ht 65.0 in | Wt 119.0 lb

## 2014-03-15 DIAGNOSIS — M5416 Radiculopathy, lumbar region: Secondary | ICD-10-CM

## 2014-03-15 DIAGNOSIS — W19XXXA Unspecified fall, initial encounter: Secondary | ICD-10-CM

## 2014-03-15 DIAGNOSIS — C189 Malignant neoplasm of colon, unspecified: Secondary | ICD-10-CM

## 2014-03-15 DIAGNOSIS — Z7901 Long term (current) use of anticoagulants: Secondary | ICD-10-CM

## 2014-03-15 MED ORDER — GABAPENTIN 100 MG PO CAPS: 100.0000 mg | ORAL_CAPSULE | Freq: Three times a day (TID) | ORAL | Status: DC

## 2014-03-15 NOTE — Patient Instructions (Signed)
Overall you are doing fairly well but I do want to suggest a few things today:   Remember to drink plenty of fluid, eat healthy meals and do not skip any meals. Try to eat protein with a every meal and eat a healthy snack such as fruit or nuts in between meals. Try to keep a regular sleep-wake schedule and try to exercise daily, particularly in the form of walking, 20-30 minutes a day, if you can.   As far as your medications are concerned, I would like to suggest Neurontin 100mg  3 times daily. Start with one dose daily and slowly increase.   As far as diagnostic testing: MRi of the lumbar spine, CT of the abdomen pelvis  I would like to see you back in 3 months, sooner if we need to. Please call us with any interim questions, concerns, problems, updates or refill requests.   Please also call us for any test results so we can go over those with you on the phone.  My clinical assistant and will answer any of your questions and relay your messages to me and also relay most of my messages to you.   Our phone number is [phone]. We also have an after hours call service for urgent matters and there is a physician on-call for urgent questions. For any emergencies you know to call 911 or go to the nearest emergency room

## 2014-03-15 NOTE — Progress Notes (Addendum)
[facility]    Provider:  Dr Jaynee Eagles Referring Provider: [name], MD Primary Care Physician:  [name], MD  CC:  Left leg pain  HPI:  Bethany [name] is a 81 y.o. female here as a referral from Dr. Maudie Mercury for left leg pain. She is a lovely patient who is unfortunately dealing with many medical issues currently including rectal adenocarcinoma diagnosed in 09/2013 with possible metastasis s/p radiation and chemotherapy.  She is here for evaluation of left leg pain and weakness. She fell on the 20th of October, went to [facility] to ICU.  She went to rehab until December. She was hospitalized again in December after another fall, tripped and fell and hit her head. She has had a difficult course and continues to loose weight. She is on anticoagulation. She has been home since January 9th. There is numbness of the left leg. The whole leg feels like it is asleep. Also there is weakness in the left leg. No low back pain. No hip pain. Symptoms starts in the upper thigh and goes all the way down. It isn't improving, staying the same or getting worse. She denies any weakness or sensory changes before her fall in October. Since the fall, she has needed a walker. She is having significant pain in the leg. She gets shooting pain when she is walking or laying down or sitting, really anytime, sharp pain shoots down the front of the thigh.   Reviewed notes, labs and imaging from outside physicians, which showed:   EXAM: THORACIC SPINE - 2 VIEW  COMPARISON: CT scan of November 28, 2013.  FINDINGS: No acute fracture or significant spondylolisthesis is noted. Mild anterior osteophyte formation is noted throughout the thoracic spine. Diffuse osteopenia is noted.  IMPRESSION: No acute abnormality seen in the thoracic spine.  Personally reviewed images and agree with findings.  BMP unremarkable, GFR 88.   Review of Systems: Patient complains of symptoms per HPI as well as the following  symptoms: weight loss, incontinence, numbness, weakness, depression, anxiety, decreased energy. Pertinent negatives per HPI. All others negative.   History   Social History  . Marital Status: Single    Spouse Name: N/A    Number of Children: 0  . Years of Education: [employer]   Occupational History  . Retired Other   Social History Main Topics  . Smoking status: Never Smoker   . Smokeless tobacco: Never Used  . Alcohol Use: No  . Drug Use: No  . Sexual Activity: Not on file   Other Topics Concern  . Not on file   Social History Narrative   Patient lives at home with her nephew.   Caffeine Use: 8oz daily    Family History  Problem Relation Age of Onset  . Brain cancer Mother   . Heart attack Father   . Diabetes Sister   . Heart disease Sister     Past Medical History  Diagnosis Date  . Migraine   . GERD (gastroesophageal reflux disease)   . Hypertension   . Hypercholesteremia   . Pulmonary embolism 01/01/2012  . Hypothyroidism   . Rectal mass   . Ovarian tumor     sees dr Lisbeth Renshaw ob-gyn yearly for evaluation  . Cancer 09/29/13    rectal adenocarcinoma  . Rectal cancer 09/29/13    invasive adeocarcinoma  . Hx of radiation therapy 10/25/13-11/28/13    pelvis, 4500 cGy in 25 sessions  . History of radiation therapy 10/25/13- 11/28/13  pelvis 4500 cGy 25 sessions, no rectal boost  . Incontinence of urine   . Anxiety and depression     Past Surgical History  Procedure Laterality Date  . Ovarian cyst surgery  yrs ago  . Cataract extraction Bilateral   . Colonoscopy N/A 09/22/2013    Procedure: COLONOSCOPY;  Surgeon: Beryle Beams, MD;  Location: [facility];  Service: Endoscopy;  Laterality: N/A;  . Eus N/A 09/29/2013    Procedure: LOWER ENDOSCOPIC ULTRASOUND (EUS);  Surgeon: Beryle Beams, MD;  Location: [facility];  Service: Endoscopy;  Laterality: N/A;  . Abdominal hysterectomy      years ago, prolapsed uterus    Current Outpatient Prescriptions    Medication Sig Dispense Refill  . acetaminophen (TYLENOL) 325 MG tablet Take 2 tablets (650 mg total) by mouth every 6 (six) hours as needed for mild pain (or Fever >/= 101).    . Amino Acids-Protein Hydrolys (FEEDING SUPPLEMENT, PRO-STAT SUGAR FREE 64,) LIQD Take 30 mLs by mouth 2 (two) times daily between meals. 900 mL 0  . cholecalciferol (VITAMIN D) 1000 UNITS tablet Take 2,000 Units by mouth daily.    . feeding supplement, RESOURCE BREEZE, (RESOURCE BREEZE) LIQD Take 1 Container by mouth 2 (two) times daily between meals.  0  . L-Methylfolate-B6-B12 (METANX PO) Take 1 tablet by mouth 2 (two) times daily.    Marland Kitchen levothyroxine (SYNTHROID, LEVOTHROID) 25 MCG tablet Take 25 mcg by mouth See admin instructions. 25 mcg  on Monday, Wednesday, Friday and Sunday.    . levothyroxine (SYNTHROID, LEVOTHROID) 75 MCG tablet Take 1 tablet by mouth every other day. Tues., Thurs., Sat.    . rivaroxaban (XARELTO) 20 MG TABS tablet Take 1 tablet (20 mg total) by mouth daily with supper. 30 tablet 0  . sertraline (ZOLOFT) 25 MG tablet Take 25 mg by mouth daily.    Marland Kitchen gabapentin (NEURONTIN) 100 MG capsule Take 1 capsule (100 mg total) by mouth 3 (three) times daily. 90 capsule 6   No current facility-administered medications for this visit.    Allergies as of 03/15/2014 - Review Complete 03/15/2014  Allergen Reaction Noted  . Cataflam [diclofenac] Other (See Comments) 01/01/2012  . Cephalosporins Shortness Of Breath 06/07/2011  . Erythromycin Shortness Of Breath 06/07/2011  . Keflex [cephalexin] Shortness Of Breath 01/01/2012  . Codeine Nausea And Vomiting 06/07/2011  . Darvocet [propoxyphene n-acetaminophen] Other (See Comments) 01/01/2012  . Naproxen Other (See Comments) 01/01/2012  . Penicillins Other (See Comments) 01/01/2012  . Synalgos-dc [aspirin-caff-dihydrocodeine] Other (See Comments) 01/01/2012  . Ultram [tramadol] Other (See Comments) 01/01/2012    Vitals: BP 151/83 mmHg  Pulse 117  Ht 5'  5" (1.651 m)  Wt 119 lb (53.978 kg)  BMI 19.80 kg/m2 Last Weight:  Wt Readings from Last 1 Encounters:  03/15/14 119 lb (53.978 kg)   Last Height:   Ht Readings from Last 1 Encounters:  03/15/14 5\' 5"  (1.651 m)   Physical exam: Exam: Gen: NAD, conversant, cachectic, well groomed                     CV: RRR, no MRG. No Carotid Bruits. No peripheral edema, warm, nontender Eyes: Conjunctivae clear without exudates or hemorrhage  Neuro: Detailed Neurologic Exam  Speech:No dysarthria, no aphasia   Cognition:    The patient is oriented to person, place, and time;     recent and remote memory intact;     language fluent;     normal attention, concentration,  fund of knowledge Cranial Nerves:    The pupils are equal, round, and minimally reactive, small pupils. Cannot visualize fundi sue to small pupils.  Visual fields are full to finger confrontation. Extraocular movements are intact. Trigeminal sensation is intact and the muscles of mastication are normal. The face is symmetric. The palate elevates in the midline. Hearing intact. Voice is normal. Shoulder shrug is normal. The tongue has normal motion without fasciculations.   Coordination:    No dysmetria  Gait:  antalgic, with walker   Motor Observion Mild postural tremor, no drift  Tone:    Decreased muscle tone.    Posture:    Posture is normal sitting in chair, uses walker to ambulate    Strength: Declined full motor testing, says it is painful.  Left hip flexion and knee extension weakness.          Sensation: pin prick decreased left anterior thigh and left medial lower leg     Reflex Exam:  DTR's: reduced left patellar    Toes:    The toes are downgoing bilaterally.   Clonus:    Clonus is absent.       Assessment/Plan:  ARYEL [name] is a 81 y.o. female here as a referral from Dr. Maudie Mercury for left leg pain. She is a lovely patient who is unfortunately dealing with many medical issues currently including  rectal adenocarcinoma diagnosed in 09/2013 with possible metastasis s/p radiation and chemotherapy.  She is here for evaluation of left leg pain and weakness after a fall. Exam significant for left weakness in hip flexion and knee extension with decreased sensation in the femoral sensory distribution.  Neurontin 100mg  up to three times daily. Start with once daily and slowly increase to 3x daily for pain. MRI of the lumbar spine CT abdomen/pelvis  to ensure no hematoma(on anticoagulation with falls) or mass (rectal cancer with mets) that may compress nerves in the plexus.   Sarina Ill, MD  [facility] [address] [place],  [address]  Phone [phone] Fax [phone]  A total of 60 minutes was spent in with this patient. Over half this time was spent on counseling patient on the lumbar radiculopathy diagnosis and different diagnostic and therapeutic options available.

## 2014-03-17 NOTE — Addendum Note (Signed)
Addended by: Sarina Ill B on: 03/17/2014 07:14 PM   Modules accepted: Level of Service

## 2014-04-03 ENCOUNTER — Ambulatory Visit
Admission: RE | Admit: 2014-04-03 | Discharge: 2014-04-03 | Disposition: A | Discharge status: Home / Self Care | Payer: PRIVATE HEALTH INSURANCE | Source: Ambulatory Visit | Attending: Neurology | Admitting: Neurology

## 2014-04-03 ENCOUNTER — Other Ambulatory Visit: Payer: Medicare Other

## 2014-04-03 DIAGNOSIS — Z7901 Long term (current) use of anticoagulants: Secondary | ICD-10-CM

## 2014-04-03 DIAGNOSIS — M5416 Radiculopathy, lumbar region: Secondary | ICD-10-CM

## 2014-04-03 DIAGNOSIS — W19XXXA Unspecified fall, initial encounter: Secondary | ICD-10-CM

## 2014-04-03 DIAGNOSIS — C189 Malignant neoplasm of colon, unspecified: Secondary | ICD-10-CM

## 2014-04-04 ENCOUNTER — Ambulatory Visit
Admission: RE | Admit: 2014-04-04 | Discharge: 2014-04-04 | Disposition: A | Discharge status: Home / Self Care | Payer: PRIVATE HEALTH INSURANCE | Source: Ambulatory Visit | Attending: Neurology | Admitting: Neurology

## 2014-04-04 DIAGNOSIS — W19XXXA Unspecified fall, initial encounter: Secondary | ICD-10-CM

## 2014-04-04 DIAGNOSIS — Z7901 Long term (current) use of anticoagulants: Secondary | ICD-10-CM

## 2014-04-04 DIAGNOSIS — C189 Malignant neoplasm of colon, unspecified: Secondary | ICD-10-CM

## 2014-04-04 DIAGNOSIS — M5416 Radiculopathy, lumbar region: Secondary | ICD-10-CM

## 2014-04-04 MED ORDER — IOHEXOL 300 MG/ML  SOLN
100.0000 mL | Freq: Once | INTRAMUSCULAR | Status: AC | PRN
  Administered 2014-04-04: 100 mL via INTRAVENOUS

## 2014-04-09 ENCOUNTER — Telehealth: Payer: Self-pay | Admitting: Neurology

## 2014-04-09 NOTE — Telephone Encounter (Signed)
Patient having pain and weakness in the left leg after a fall in October of last year. Spoke to both patient and her nephew. Unclear if she fell as a result of left leg weakness or if the weakness happened after the fall MRI of the Lumbar spine was stable compared to 2008 and imaging of the abdomen/pelvis did not reveal any pathology to explain her symptoms (PMHx of rectal cancer). Advised an MRI of the brain to look for stroke that may be causing her symptoms. They will get back to [place] after they decide.  Imaging of the abd/pelvis revealed moderately large amount of feces throughout the colon and possible edema within the rectosigmoid colon. Patient follows closely with pcp and her oncologist for management.    Informed them of  left inguinal hernia containing fat.  Discussed bilateral parapelvic cysts. They are aware and already follow with obgyn closely for this issue. They have a follow up scheduled.

## 2014-04-10 ENCOUNTER — Ambulatory Visit (HOSPITAL_BASED_OUTPATIENT_CLINIC_OR_DEPARTMENT_OTHER): Payer: Medicare Other | Admitting: Oncology

## 2014-04-10 ENCOUNTER — Telehealth: Payer: Self-pay | Admitting: Oncology

## 2014-04-10 VITALS — BP 158/55 | HR 75 | Temp 97.9°F | Resp 18 | Ht 65.0 in | Wt 119.2 lb

## 2014-04-10 DIAGNOSIS — Z7901 Long term (current) use of anticoagulants: Secondary | ICD-10-CM

## 2014-04-10 DIAGNOSIS — C20 Malignant neoplasm of rectum: Secondary | ICD-10-CM

## 2014-04-10 DIAGNOSIS — Z86711 Personal history of pulmonary embolism: Secondary | ICD-10-CM

## 2014-04-10 DIAGNOSIS — K769 Liver disease, unspecified: Secondary | ICD-10-CM

## 2014-04-10 DIAGNOSIS — R531 Weakness: Secondary | ICD-10-CM

## 2014-04-10 NOTE — Progress Notes (Signed)
Hematology and Oncology Follow Up Visit  Bethany [name] [id number] 1928-07-21 81 y.o. 04/10/2014 3:31 PM [name], MDKim, [name], MD   Principle Diagnosis: 81 year old woman with rectal adenocarcinoma diagnosed in August of 2015. Her staging by EUS his T3 N0. MRI of the liver showed potential hepatic metastasis.  Past therapy: Radiation therapy concomitantly with oral Xeloda at 500 mg twice a day started on 10/25/2013.  Current therapy: Supportive care only. She is under consideration for surgical resection of her tumor.  Interim History:  Ms. Bethany [name] presents today for a followup visit. Since the last visit, she has not reported any new complaints. She did develop progressive weakness and her left leg that has been evaluated by CT scan and MRI which did not reveal any malignancy or cord compression. She was evaluated by neurology as well and was started on Neurontin which have helped her neuropathic pain. She is ambulatory using a walker for very short distances inside her house. She does not report any specific symptoms of chest pain or bone pain. She does report slow bowel movement and constipation.  She has not had any headaches or blurry vision. She does not report any syncope or seizures. She does not report any chest pain, shortness of breath, cough or hemoptysis. She does not report any palpitation, orthopnea or PND. She does not report any leg edema. She denies abdominal pain, constipation or obstruction. She does not report any frequency urgency or hesitancy. She is not abort any skeletal complaints. Rest of the review of systems unremarkable.     Medications: I have reviewed the patient's current medications.  Current Outpatient Prescriptions  Medication Sig Dispense Refill  . acetaminophen (TYLENOL) 325 MG tablet Take 2 tablets (650 mg total) by mouth every 6 (six) hours as needed for mild pain (or Fever >/= 101).    . cholecalciferol (VITAMIN D) 1000 UNITS tablet Take 2,000 Units by  mouth daily.    . feeding supplement, RESOURCE BREEZE, (RESOURCE BREEZE) LIQD Take 1 Container by mouth 2 (two) times daily between meals.  0  . gabapentin (NEURONTIN) 100 MG capsule Take 1 capsule (100 mg total) by mouth 3 (three) times daily. 90 capsule 6  . levothyroxine (SYNTHROID, LEVOTHROID) 25 MCG tablet Take 25 mcg by mouth See admin instructions. 25 mcg  on Monday, Wednesday, Friday and Sunday.    . levothyroxine (SYNTHROID, LEVOTHROID) 75 MCG tablet Take 1 tablet by mouth every other day. Tues., Thurs., Sat.    . rivaroxaban (XARELTO) 20 MG TABS tablet Take 1 tablet (20 mg total) by mouth daily with supper. 30 tablet 0  . sertraline (ZOLOFT) 25 MG tablet Take 25 mg by mouth daily.     No current facility-administered medications for this visit.     Allergies:  Allergies  Allergen Reactions  . Cataflam [Diclofenac] Other (See Comments)    Unknown; patient is unaware of allergy.  . Cephalosporins Shortness Of Breath  . Erythromycin Shortness Of Breath  . Keflex [Cephalexin] Shortness Of Breath  . Codeine Nausea And Vomiting  . Darvocet [Propoxyphene N-Acetaminophen] Other (See Comments)    Unknown; patient is unaware of allergy  . Naproxen Other (See Comments)    Unknown; patient is unaware of allergy  . Penicillins Other (See Comments)    Patient states that she CAN take this; unaware of allergy  . Synalgos-Dc [Aspirin-Caff-Dihydrocodeine] Other (See Comments)    Unknown; patient is unaware of allergy  . Ultram [Tramadol] Other (See Comments)    Unknown; patient  is unaware of allergy    Past Medical History, Surgical history, Social history, and Family History were reviewed and updated.   Physical Exam: Blood pressure 158/55, pulse 75, temperature 97.9 F (36.6 C), temperature source Oral, resp. rate 18, height 5\' 5"  (1.651 m), weight 119 lb 3.2 oz (54.069 kg), SpO2 100 %. ECOG: 2 General appearance: alert and cooperative. Chronically ill-appearing improved from the  last visit. Head: Normocephalic, without obvious abnormality Neck: no adenopathy Lymph nodes: Cervical, supraclavicular, and axillary nodes normal. Heart:regular rate and rhythm, S1, S2 normal, no murmur, click, rub or gallop Lung:chest clear, no wheezing, rales, normal symmetric air entry. Abdomen: soft, non-tender, without masses or organomegaly EXT:no erythema, induration, or nodules.  Neurological: Weakness noted in her left lower extremity more than the right.   Lab Results: Lab Results  Component Value Date   WBC 4.1 01/23/2014   HGB 10.9* 01/23/2014   HCT 34.0* 01/23/2014   MCV 96.6 01/23/2014   PLT 176 01/23/2014     Chemistry      Component Value Date/Time   NA 141 01/25/2014 0540   NA 140 11/23/2013 1426   K 3.7 01/25/2014 0540   K 4.0 11/23/2013 1426   CL 104 01/25/2014 0540   CO2 29 01/25/2014 0540   CO2 28 11/23/2013 1426   BUN 19 01/25/2014 0540   BUN 16.5 11/23/2013 1426   CREATININE 0.48* 01/25/2014 0540   CREATININE 0.7 11/23/2013 1426      Component Value Date/Time   CALCIUM 8.9 01/25/2014 0540   CALCIUM 9.6 11/23/2013 1426   ALKPHOS 62 01/23/2014 0511   ALKPHOS 110 11/23/2013 1426   AST 15 01/23/2014 0511   AST 32 11/23/2013 1426   ALT 10 01/23/2014 0511   ALT 52 11/23/2013 1426   BILITOT 0.7 01/23/2014 0511   BILITOT 1.01 11/23/2013 1426     MRI L spine 04/03/2014 Abnormal MRI lumbar spine (without) demonstrating: 1. The conus medullaris terminates at the level of L2-3 and there is thin fatty filum terminale, which may be consistent with tethered cord syndrome.  2. Multi-level disc bulging and facet hypertrophy with mild biforaminal stenosis from L1-2 to L5-S1 levels. 3. Bilateral renal cysts, noted on prior CT abd/pelvis studies. 4. Compared to MRI on 09/05/06, there has been slight progression of degenerative changes. Fatty filum terminale and low lying conus medullaris are stable.   EXAM: CT ABDOMEN AND PELVIS WITH  CONTRAST  TECHNIQUE: Multidetector CT imaging of the abdomen and pelvis was performed using the standard protocol following bolus administration of intravenous contrast.  CONTRAST: 161mL OMNIPAQUE IOHEXOL 300 MG/ML SOLN  COMPARISON: CT chest abdomen pelvis of 11/28/2013  FINDINGS: The lung bases appear clear. The heart is mildly enlarged. The liver enhances with no focal abnormality and no ductal dilatation is seen. No calcified gallstones are noted with gallbladder folding upon itself. The pancreas appears normal in size and the pancreatic duct is not dilated. The adrenal glands and spleen are unchanged. On delayed images bilateral renal pelvic cysts are noted splaying the calices but no obstruction is seen. The proximal ureters appear normal in caliber. The abdominal aorta is normal in caliber the with only mild atheromatous change present. No adenopathy is seen.  There is a moderately large amount of feces throughout the entire colon. This makes evaluation of possible colonic edema difficult, but mild edema of the rectosigmoid colon mucosa cannot be excluded. The uterus is to the right of midline. No adnexal lesion is seen and no free fluid  is noted within the pelvis. The urinary bladder is not well distended. Fat enters the left inguinal canal. The terminal ileum is unremarkable. The appendix is not definitely seen. The lumbar vertebrae are in normal alignment with diffuse degenerative disc disease present.  IMPRESSION: 1. Moderately large amount of feces throughout the colon. Cannot exclude edema within the rectosigmoid colon. No diverticulitis or free fluid is seen. 2. Left inguinal hernia containing fat. 3. Bilateral parapelvic cysts. No hydronephrosis.   Impression and Plan:  81 year old woman with the following issues:   1. Rectal cancer presented with hematochezia and status post colonoscopy and EUS. The care of by EUS criteria was 4 cm in length  around 4-5 cm from the anal verge. The staging was T3 N0 M1 with possible liver metastasis. She is status post radiation therapy with oral Xeloda with still residual tumor as evident by the PET scan results. Despite her age, her morbid conditions the next step would be surgical resection as without surgery she will probably develop further complications down the line. These complications include obstruction, possible perforation and possible metastatic disease. She have obtained cardiac clearance and she will likely be operated on by Dr. Johney [place] in the next 3-4 weeks.    2. 19 mm lesion in the right hepatic lobe. This could represent metastatic disease from her rectal cancer. Her PET scan images did not show any additional findings and certainly appears to be PET avid. I do not think she is a candidate for hepatic resection but certainly could be a candidate for liver directed therapy and possible ablation. I will refer her to interventional radiology for an evaluation regarding this issue.  3. History of pulmonary embolism: She is on full dose anticoagulation with Xarelto. She needs to be on lifetime anticoagulation as she developed another blood clot off of it.  4. Left lower extremity weakness and possible neuropathy: There is no evidence of cord compression and currently on Neurontin and follows by neurology at this time.  5. Follow-up: Will be to 3 months after her operation which will be presumably in the first part of April.  Zola Button, MD 3/1/20163:31 PM

## 2014-04-10 NOTE — Telephone Encounter (Signed)
gv and prnted appt sched and avs for pt for May

## 2014-05-09 ENCOUNTER — Encounter (HOSPITAL_COMMUNITY): Payer: Self-pay

## 2014-05-09 ENCOUNTER — Encounter (HOSPITAL_COMMUNITY)
Admission: RE | Admit: 2014-05-09 | Discharge: 2014-05-09 | Disposition: A | Discharge status: Home / Self Care | Payer: Medicare Other | Source: Ambulatory Visit | Attending: Surgery | Admitting: Surgery

## 2014-05-09 DIAGNOSIS — Z01812 Encounter for preprocedural laboratory examination: Secondary | ICD-10-CM | POA: Insufficient documentation

## 2014-05-09 HISTORY — DX: Major depressive disorder, single episode, unspecified: F32.9

## 2014-05-09 HISTORY — DX: Unspecified osteoarthritis, unspecified site: M19.90

## 2014-05-09 HISTORY — DX: Rosacea, unspecified: L71.9

## 2014-05-09 HISTORY — DX: Anesthesia of skin: R20.0

## 2014-05-09 HISTORY — DX: Personal history of other malignant neoplasm of skin: Z85.828

## 2014-05-09 HISTORY — DX: Change in bowel habit: R19.4

## 2014-05-09 HISTORY — DX: Anxiety disorder, unspecified: F41.9

## 2014-05-09 HISTORY — DX: Depression, unspecified: F32.A

## 2014-05-09 HISTORY — DX: Personal history of urinary (tract) infections: Z87.440

## 2014-05-09 LAB — BASIC METABOLIC PANEL
Anion gap: 8 (ref 5–15)
BUN: 14 mg/dL (ref 6–23)
CALCIUM: 9.1 mg/dL (ref 8.4–10.5)
CO2: 32 mmol/L (ref 19–32)
Chloride: 100 mmol/L (ref 96–112)
Creatinine, Ser: 0.4 mg/dL — ABNORMAL LOW (ref 0.50–1.10)
GFR calc non Af Amer: >90 mL/min (ref >90)
Glucose, Bld: 93 mg/dL (ref 70–99)
Potassium: 3.6 mmol/L (ref 3.5–5.1)
SODIUM: 140 mmol/L (ref 135–145)

## 2014-05-09 LAB — CBC
HCT: 38.1 % (ref 36.0–46.0)
HEMOGLOBIN: 12.5 g/dL (ref 12.0–15.0)
MCH: 31.2 pg (ref 26.0–34.0)
MCHC: 32.8 g/dL (ref 30.0–36.0)
MCV: 95 fL (ref 78.0–100.0)
Platelets: 173 10*3/uL (ref 150–400)
RBC: 4.01 MIL/uL (ref 3.87–5.11)
RDW: 13.2 % (ref 11.5–15.5)
WBC: 3.7 10*3/uL — ABNORMAL LOW (ref 4.0–10.5)

## 2014-05-09 LAB — ABO/RH: ABO/RH(D): O POS

## 2014-05-09 NOTE — Consult Note (Signed)
[facility] ostomy consult note Patient seen per Dr. Johney [place]' request for preoperative stoma site marking. Patient is accompanied today by her nephew, who is her main care provider, but who does not live with her.  She is in a wheelchair today and is unsteady on her feet. She is incontinent of urine and wears an adult disposable brief.  She currently has HHRN support from [facility] that is not immediately obvious.  Abdomen assessed in the sitting position and briefly in the standing position.  She has a flaccid abdomen with many superficial skin folds. The rectus muscle is difficult to palpate.  I marked both a RUQ loop ileostomy and LUQ colostomy site today; the right UQ site is located 7.5cm to the right of the umbilicus and 2cm above and the left UQ site is located 5.5cm to the left of the umbilicus and 3cm above.  Both sites are marked with a surgical site marking pen and covered with a thin film transparent dressing. Education provided: Patient and her nephew are instructed that there will be a Best boy available to assist them with learning the care and management of the ostomy in the immediate postoperative period and that if they desire, either short term placement in a Rehab facility or discharge home with [facility] support can and will be arranged.  They decline written educational material at this time, preferring to be taught about her specific type of ostomy after one is actually created. Thank you for allowing me to see this patient preoperatively.  In the event a stoma is created intraoperatively on 05/17/14, please re-consult. Thanks, Maudie Flakes, MSN, RN, [place], [place], [place] [phone])

## 2014-05-09 NOTE — Patient Instructions (Signed)
Bethany [name]  05/09/2014   Your procedure is scheduled on: 05/16/13   Report to [facility] Main  Entrance and follow signs to               [place] at 5:30  AM.   Call this number if you have problems the morning of surgery [phone]   Remember:  Do not eat food or drink liquids :After Midnight.    Take these medicines the morning of surgery with A SIP OF WATER: SYNTHROID / GABAPENTIN             STOP ASPIRIN / HERBAL MEDS / VITAMINS 5 DAYS PREOP               FOLLOW BOWEL PREP INSTRUCTIONS                               You may not have any metal on your body including hair pins and              piercings  Do not wear jewelry, make-up, lotions, powders or perfumes.             Do not wear nail polish.  Do not shave  48 hours prior to surgery.              Men may shave face and neck.   Do not bring valuables to the hospital. [place].  Contacts, dentures or bridgework may not be worn into surgery.  Leave suitcase in the car. After surgery it may be brought to your room.     Patients discharged the day of surgery will not be allowed to drive home.  Name and phone number of your driver:  Special Instructions: N/A              Please read over the following fact sheets you were given: _____________________________________________________________________                                                     [place]  Before surgery, you can play an important role.  Because skin is not sterile, your skin needs to be as free of germs as possible.  You can reduce the number of germs on your skin by washing with CHG (chlorahexidine gluconate) soap before surgery.  CHG is an antiseptic cleaner which kills germs and bonds with the skin to continue killing germs even after washing. Please DO NOT use if you have an allergy to CHG or antibacterial soaps.  If your skin becomes  reddened/irritated stop using the CHG and inform your nurse when you arrive at Short Stay. Do not shave (including legs and underarms) for at least 48 hours prior to the first CHG shower.  You may shave your face. Please follow these instructions carefully:   1.  Shower with CHG Soap the night before surgery and the  morning of Surgery.   2.  If you choose to wash your hair, wash your hair first as usual with your  normal  Shampoo.   3.  After you shampoo, rinse your hair and body thoroughly to remove the  shampoo.                                         4.  Use CHG as you would any other liquid soap.  You can apply chg directly  to the skin and wash . Gently wash with scrungie or clean wascloth    5.  Apply the CHG Soap to your body ONLY FROM THE NECK DOWN.   Do not use on open                           Wound or open sores. Avoid contact with eyes, ears mouth and genitals (private parts).                        Genitals (private parts) with your normal soap.              6.  Wash thoroughly, paying special attention to the area where your surgery  will be performed.   7.  Thoroughly rinse your body with warm water from the neck down.   8.  DO NOT shower/wash with your normal soap after using and rinsing off  the CHG Soap .                9.  Pat yourself dry with a clean towel.             10.  Wear clean pajamas.             11.  Place clean sheets on your bed the night of your first shower and do not  sleep with pets.  Day of Surgery : Do not apply any lotions/deodorants the morning of surgery.  Please wear clean clothes to the hospital/surgery center.  FAILURE TO FOLLOW THESE INSTRUCTIONS MAY RESULT IN THE CANCELLATION OF YOUR SURGERY    PATIENT SIGNATURE_________________________________  ______________________________________________________________________    WHAT IS A BLOOD TRANSFUSION? Blood Transfusion Information  A transfusion is the replacement of blood or some  of its parts. Blood is made up of multiple cells which provide different functions.  Red blood cells carry oxygen and are used for blood loss replacement.  White blood cells fight against infection.  Platelets control bleeding.  Plasma helps clot blood.  Other blood products are available for specialized needs, such as hemophilia or other clotting disorders. BEFORE THE TRANSFUSION  Who gives blood for transfusions?   Healthy volunteers who are fully evaluated to make sure their blood is safe. This is blood bank blood. Transfusion therapy is the safest it has ever been in the practice of medicine. Before blood is taken from a donor, a complete history is taken to make sure that person has no history of diseases nor engages in risky social behavior (examples are intravenous drug use or sexual activity with multiple partners). The donor's travel history is screened to minimize risk of transmitting infections, such as malaria. The donated blood is tested for signs of infectious diseases, such as HIV and hepatitis. The blood is then tested to be sure it is compatible with you in order to minimize the chance of a transfusion reaction. If you or a relative donates blood, this is often done in anticipation of surgery and is not appropriate for emergency  situations. It takes many days to process the donated blood. RISKS AND COMPLICATIONS Although transfusion therapy is very safe and saves many lives, the main dangers of transfusion include:   Getting an infectious disease.  Developing a transfusion reaction. This is an allergic reaction to something in the blood you were given. Every precaution is taken to prevent this. The decision to have a blood transfusion has been considered carefully by your caregiver before blood is given. Blood is not given unless the benefits outweigh the risks. AFTER THE TRANSFUSION  Right after receiving a blood transfusion, you will usually feel much better and more  energetic. This is especially true if your red blood cells have gotten low (anemic). The transfusion raises the level of the red blood cells which carry oxygen, and this usually causes an energy increase.  The nurse administering the transfusion will monitor you carefully for complications. HOME CARE INSTRUCTIONS  No special instructions are needed after a transfusion. You may find your energy is better. Speak with your caregiver about any limitations on activity for underlying diseases you may have. SEEK MEDICAL CARE IF:   Your condition is not improving after your transfusion.  You develop redness or irritation at the intravenous (IV) site. SEEK IMMEDIATE MEDICAL CARE IF:  Any of the following symptoms occur over the next 12 hours:  Shaking chills.  You have a temperature by mouth above 102 F (38.9 C), not controlled by medicine.  Chest, back, or muscle pain.  People around you feel you are not acting correctly or are confused.  Shortness of breath or difficulty breathing.  Dizziness and fainting.  You get a rash or develop hives.  You have a decrease in urine output.  Your urine turns a dark color or changes to pink, red, or brown. Any of the following symptoms occur over the next 10 days:  You have a temperature by mouth above 102 F (38.9 C), not controlled by medicine.  Shortness of breath.  Weakness after normal activity.  The white part of the eye turns yellow (jaundice).  You have a decrease in the amount of urine or are urinating less often.  Your urine turns a dark color or changes to pink, red, or brown. Document Released: 01/24/2000 Document Revised: 04/20/2011 Document Reviewed: 09/12/2007 [facility] Patient Information 2014 [place], [place].  _______________________________________________________________________

## 2014-05-10 LAB — HEMOGLOBIN A1C
Hgb A1c MFr Bld: 5.2 % (ref 4.8–5.6)
MEAN PLASMA GLUCOSE: 103 mg/dL

## 2014-05-17 ENCOUNTER — Inpatient Hospital Stay (HOSPITAL_COMMUNITY)
Admission: RE | Admit: 2014-05-17 | Discharge: 2014-05-23 | DRG: 330 | Disposition: A | Discharge status: Skilled Nursing Facility | Payer: Medicare Other | Attending: Surgery | Admitting: Surgery

## 2014-05-17 ENCOUNTER — Inpatient Hospital Stay (HOSPITAL_COMMUNITY): Payer: Medicare Other | Admitting: Registered Nurse

## 2014-05-17 ENCOUNTER — Encounter (HOSPITAL_COMMUNITY)
Admission: RE | Disposition: A | Discharge status: Skilled Nursing Facility | Payer: Self-pay | Source: Home / Self Care | Attending: Surgery

## 2014-05-17 ENCOUNTER — Encounter (HOSPITAL_COMMUNITY): Payer: Self-pay | Admitting: *Deleted

## 2014-05-17 DIAGNOSIS — E440 Moderate protein-calorie malnutrition: Secondary | ICD-10-CM | POA: Diagnosis present

## 2014-05-17 DIAGNOSIS — D630 Anemia in neoplastic disease: Secondary | ICD-10-CM | POA: Diagnosis present

## 2014-05-17 DIAGNOSIS — M1990 Unspecified osteoarthritis, unspecified site: Secondary | ICD-10-CM | POA: Diagnosis present

## 2014-05-17 DIAGNOSIS — Z86711 Personal history of pulmonary embolism: Secondary | ICD-10-CM | POA: Diagnosis not present

## 2014-05-17 DIAGNOSIS — Z8249 Family history of ischemic heart disease and other diseases of the circulatory system: Secondary | ICD-10-CM | POA: Diagnosis not present

## 2014-05-17 DIAGNOSIS — C19 Malignant neoplasm of rectosigmoid junction: Secondary | ICD-10-CM | POA: Diagnosis present

## 2014-05-17 DIAGNOSIS — Z923 Personal history of irradiation: Secondary | ICD-10-CM

## 2014-05-17 DIAGNOSIS — R32 Unspecified urinary incontinence: Secondary | ICD-10-CM | POA: Diagnosis present

## 2014-05-17 DIAGNOSIS — I10 Essential (primary) hypertension: Secondary | ICD-10-CM | POA: Diagnosis present

## 2014-05-17 DIAGNOSIS — N839 Noninflammatory disorder of ovary, fallopian tube and broad ligament, unspecified: Secondary | ICD-10-CM | POA: Diagnosis present

## 2014-05-17 DIAGNOSIS — Z7982 Long term (current) use of aspirin: Secondary | ICD-10-CM

## 2014-05-17 DIAGNOSIS — E039 Hypothyroidism, unspecified: Secondary | ICD-10-CM | POA: Diagnosis present

## 2014-05-17 DIAGNOSIS — I4891 Unspecified atrial fibrillation: Secondary | ICD-10-CM | POA: Diagnosis present

## 2014-05-17 DIAGNOSIS — C787 Secondary malignant neoplasm of liver and intrahepatic bile duct: Secondary | ICD-10-CM | POA: Diagnosis present

## 2014-05-17 DIAGNOSIS — R197 Diarrhea, unspecified: Secondary | ICD-10-CM | POA: Diagnosis present

## 2014-05-17 DIAGNOSIS — R21 Rash and other nonspecific skin eruption: Secondary | ICD-10-CM | POA: Diagnosis present

## 2014-05-17 DIAGNOSIS — Z881 Allergy status to other antibiotic agents status: Secondary | ICD-10-CM

## 2014-05-17 DIAGNOSIS — F329 Major depressive disorder, single episode, unspecified: Secondary | ICD-10-CM | POA: Diagnosis present

## 2014-05-17 DIAGNOSIS — Z79899 Other long term (current) drug therapy: Secondary | ICD-10-CM | POA: Diagnosis not present

## 2014-05-17 DIAGNOSIS — K219 Gastro-esophageal reflux disease without esophagitis: Secondary | ICD-10-CM | POA: Diagnosis present

## 2014-05-17 DIAGNOSIS — Z7901 Long term (current) use of anticoagulants: Secondary | ICD-10-CM | POA: Diagnosis not present

## 2014-05-17 DIAGNOSIS — C20 Malignant neoplasm of rectum: Secondary | ICD-10-CM | POA: Diagnosis present

## 2014-05-17 DIAGNOSIS — Z85828 Personal history of other malignant neoplasm of skin: Secondary | ICD-10-CM | POA: Diagnosis not present

## 2014-05-17 DIAGNOSIS — Z885 Allergy status to narcotic agent status: Secondary | ICD-10-CM

## 2014-05-17 DIAGNOSIS — J383 Other diseases of vocal cords: Secondary | ICD-10-CM

## 2014-05-17 DIAGNOSIS — M5416 Radiculopathy, lumbar region: Secondary | ICD-10-CM | POA: Diagnosis present

## 2014-05-17 DIAGNOSIS — E780 Pure hypercholesterolemia: Secondary | ICD-10-CM | POA: Diagnosis present

## 2014-05-17 DIAGNOSIS — C7800 Secondary malignant neoplasm of unspecified lung: Secondary | ICD-10-CM | POA: Diagnosis present

## 2014-05-17 DIAGNOSIS — Z01812 Encounter for preprocedural laboratory examination: Secondary | ICD-10-CM

## 2014-05-17 DIAGNOSIS — E876 Hypokalemia: Secondary | ICD-10-CM | POA: Diagnosis not present

## 2014-05-17 DIAGNOSIS — I2699 Other pulmonary embolism without acute cor pulmonale: Secondary | ICD-10-CM | POA: Diagnosis present

## 2014-05-17 DIAGNOSIS — Z888 Allergy status to other drugs, medicaments and biological substances status: Secondary | ICD-10-CM | POA: Diagnosis not present

## 2014-05-17 DIAGNOSIS — Z808 Family history of malignant neoplasm of other organs or systems: Secondary | ICD-10-CM | POA: Diagnosis not present

## 2014-05-17 DIAGNOSIS — N838 Other noninflammatory disorders of ovary, fallopian tube and broad ligament: Secondary | ICD-10-CM | POA: Diagnosis present

## 2014-05-17 DIAGNOSIS — Z833 Family history of diabetes mellitus: Secondary | ICD-10-CM

## 2014-05-17 DIAGNOSIS — F419 Anxiety disorder, unspecified: Secondary | ICD-10-CM | POA: Diagnosis present

## 2014-05-17 HISTORY — DX: Encephalopathy, unspecified: G93.40

## 2014-05-17 HISTORY — DX: Enterocolitis due to Clostridium difficile, not specified as recurrent: A04.72

## 2014-05-17 HISTORY — DX: Urinary tract infection, site not specified: N39.0

## 2014-05-17 HISTORY — DX: Sepsis, unspecified organism: A41.9

## 2014-05-17 HISTORY — DX: Hypokalemia: E87.6

## 2014-05-17 HISTORY — DX: Unspecified fall, initial encounter: W19.XXXA

## 2014-05-17 HISTORY — DX: Other pulmonary embolism with acute cor pulmonale: I26.09

## 2014-05-17 HISTORY — DX: Severe sepsis with septic shock: R65.21

## 2014-05-17 HISTORY — DX: Other diseases of vocal cords: J38.3

## 2014-05-17 LAB — TYPE AND SCREEN
ABO/RH(D): O POS
ANTIBODY SCREEN: NEGATIVE

## 2014-05-17 SURGERY — RESECTION, RECTUM, LOW ANTERIOR, ROBOT-ASSISTED
Anesthesia: General | Site: Abdomen

## 2014-05-17 SURGERY — Surgical Case
Anesthesia: *Unknown

## 2014-05-17 MED ORDER — CLINDAMYCIN PHOSPHATE 900 MG/50ML IV SOLN
900.0000 mg | INTRAVENOUS | Status: AC
  Administered 2014-05-17: 900 mg via INTRAVENOUS

## 2014-05-17 MED ORDER — DEXAMETHASONE SODIUM PHOSPHATE 10 MG/ML IJ SOLN
INTRAMUSCULAR | Status: DC | PRN
  Administered 2014-05-17: 10 mg via INTRAVENOUS

## 2014-05-17 MED ORDER — ONDANSETRON HCL 4 MG/2ML IJ SOLN
4.0000 mg | Freq: Four times a day (QID) | INTRAMUSCULAR | Status: DC | PRN
  Administered 2014-05-18 (×2): 4 mg via INTRAVENOUS
  Filled 2014-05-17 (×2): qty 2

## 2014-05-17 MED ORDER — ZOLPIDEM TARTRATE 5 MG PO TABS: 5.0000 mg | ORAL_TABLET | Freq: Every evening | ORAL | Status: DC | PRN

## 2014-05-17 MED ORDER — HYDROMORPHONE HCL 1 MG/ML IJ SOLN
0.2500 mg | INTRAMUSCULAR | Status: DC | PRN
  Administered 2014-05-17 (×4): 0.25 mg via INTRAVENOUS

## 2014-05-17 MED ORDER — ACETAMINOPHEN 10 MG/ML IV SOLN
1000.0000 mg | Freq: Once | INTRAVENOUS | Status: AC
  Administered 2014-05-17: 1000 mg via INTRAVENOUS
  Filled 2014-05-17: qty 100

## 2014-05-17 MED ORDER — FENTANYL CITRATE 0.05 MG/ML IJ SOLN
INTRAMUSCULAR | Status: DC | PRN
  Administered 2014-05-17: 25 ug via INTRAVENOUS
  Administered 2014-05-17: 50 ug via INTRAVENOUS
  Administered 2014-05-17 (×3): 25 ug via INTRAVENOUS

## 2014-05-17 MED ORDER — MEPERIDINE HCL 50 MG/ML IJ SOLN: 6.2500 mg | INTRAMUSCULAR | Status: DC | PRN

## 2014-05-17 MED ORDER — METOPROLOL TARTRATE 1 MG/ML IV SOLN
5.0000 mg | Freq: Four times a day (QID) | INTRAVENOUS | Status: DC
  Administered 2014-05-17: 5 mg via INTRAVENOUS
  Filled 2014-05-17 (×2): qty 5

## 2014-05-17 MED ORDER — PROMETHAZINE HCL 25 MG/ML IJ SOLN: 6.2500 mg | INTRAMUSCULAR | Status: DC | PRN

## 2014-05-17 MED ORDER — LACTATED RINGERS IV SOLN
INTRAVENOUS | Status: DC | PRN
  Administered 2014-05-17: 07:00:00 via INTRAVENOUS

## 2014-05-17 MED ORDER — MENTHOL 3 MG MT LOZG
1.0000 | LOZENGE | OROMUCOSAL | Status: DC | PRN
  Filled 2014-05-17: qty 9

## 2014-05-17 MED ORDER — LIDOCAINE HCL (CARDIAC) 20 MG/ML IV SOLN
INTRAVENOUS | Status: AC
  Filled 2014-05-17: qty 5

## 2014-05-17 MED ORDER — BUPIVACAINE-EPINEPHRINE (PF) 0.25% -1:200000 IJ SOLN
INTRAMUSCULAR | Status: AC
  Filled 2014-05-17: qty 60

## 2014-05-17 MED ORDER — ALVIMOPAN 12 MG PO CAPS
12.0000 mg | ORAL_CAPSULE | Freq: Two times a day (BID) | ORAL | Status: DC
  Administered 2014-05-18 – 2014-05-19 (×3): 12 mg via ORAL
  Filled 2014-05-17 (×4): qty 1

## 2014-05-17 MED ORDER — HYDROMORPHONE HCL 1 MG/ML IJ SOLN
0.5000 mg | INTRAMUSCULAR | Status: DC | PRN
  Administered 2014-05-17: 1 mg via INTRAVENOUS
  Filled 2014-05-17: qty 1

## 2014-05-17 MED ORDER — EPHEDRINE SULFATE 50 MG/ML IJ SOLN
INTRAMUSCULAR | Status: DC | PRN
  Administered 2014-05-17 (×2): 5 mg via INTRAVENOUS

## 2014-05-17 MED ORDER — SODIUM CHLORIDE 0.9 % IR SOLN
Status: DC | PRN
  Administered 2014-05-17: 2000 mL

## 2014-05-17 MED ORDER — ROCURONIUM BROMIDE 100 MG/10ML IV SOLN
INTRAVENOUS | Status: DC | PRN
  Administered 2014-05-17: 10 mg via INTRAVENOUS
  Administered 2014-05-17: 40 mg via INTRAVENOUS
  Administered 2014-05-17 (×2): 10 mg via INTRAVENOUS

## 2014-05-17 MED ORDER — LACTATED RINGERS IR SOLN
Status: DC | PRN
  Administered 2014-05-17: 1000 mL

## 2014-05-17 MED ORDER — BUPIVACAINE LIPOSOME 1.3 % IJ SUSP
20.0000 mL | Freq: Once | INTRAMUSCULAR | Status: DC
  Filled 2014-05-17: qty 20

## 2014-05-17 MED ORDER — BOOST / RESOURCE BREEZE PO LIQD
1.0000 | Freq: Two times a day (BID) | ORAL | Status: DC
  Administered 2014-05-17 – 2014-05-23 (×10): 1 via ORAL

## 2014-05-17 MED ORDER — ONDANSETRON HCL 4 MG PO TABS: 4.0000 mg | ORAL_TABLET | Freq: Four times a day (QID) | ORAL | Status: DC | PRN

## 2014-05-17 MED ORDER — GABAPENTIN 100 MG PO CAPS
100.0000 mg | ORAL_CAPSULE | Freq: Three times a day (TID) | ORAL | Status: DC
  Administered 2014-05-17 – 2014-05-23 (×17): 100 mg via ORAL
  Filled 2014-05-17 (×20): qty 1

## 2014-05-17 MED ORDER — GLYCOPYRROLATE 0.2 MG/ML IJ SOLN
INTRAMUSCULAR | Status: AC
  Filled 2014-05-17: qty 1

## 2014-05-17 MED ORDER — MAGIC MOUTHWASH
15.0000 mL | Freq: Four times a day (QID) | ORAL | Status: DC | PRN
  Filled 2014-05-17: qty 15

## 2014-05-17 MED ORDER — ACETAMINOPHEN 500 MG PO TABS
1000.0000 mg | ORAL_TABLET | Freq: Three times a day (TID) | ORAL | Status: DC
  Administered 2014-05-17 – 2014-05-20 (×9): 1000 mg via ORAL
  Filled 2014-05-17 (×14): qty 2

## 2014-05-17 MED ORDER — SACCHAROMYCES BOULARDII 250 MG PO CAPS
250.0000 mg | ORAL_CAPSULE | Freq: Two times a day (BID) | ORAL | Status: DC
  Administered 2014-05-18 – 2014-05-23 (×11): 250 mg via ORAL
  Filled 2014-05-17 (×13): qty 1

## 2014-05-17 MED ORDER — EPHEDRINE SULFATE 50 MG/ML IJ SOLN
INTRAMUSCULAR | Status: AC
  Filled 2014-05-17: qty 1

## 2014-05-17 MED ORDER — ONDANSETRON HCL 4 MG/2ML IJ SOLN
INTRAMUSCULAR | Status: AC
  Filled 2014-05-17: qty 2

## 2014-05-17 MED ORDER — BUPIVACAINE-EPINEPHRINE 0.25% -1:200000 IJ SOLN
INTRAMUSCULAR | Status: DC | PRN
  Administered 2014-05-17: 60 mL

## 2014-05-17 MED ORDER — DIPHENHYDRAMINE HCL 12.5 MG/5ML PO ELIX: 12.5000 mg | ORAL_SOLUTION | Freq: Four times a day (QID) | ORAL | Status: DC | PRN

## 2014-05-17 MED ORDER — VITAMIN D3 25 MCG (1000 UNIT) PO TABS
2000.0000 [IU] | ORAL_TABLET | Freq: Every day | ORAL | Status: DC
  Administered 2014-05-18 – 2014-05-23 (×6): 2000 [IU] via ORAL
  Filled 2014-05-17 (×7): qty 2

## 2014-05-17 MED ORDER — ONDANSETRON HCL 4 MG/2ML IJ SOLN
INTRAMUSCULAR | Status: DC | PRN
Start: 2014-05-17 — End: 2014-05-17
  Administered 2014-05-17: 4 mg via INTRAVENOUS

## 2014-05-17 MED ORDER — HYDROMORPHONE HCL 1 MG/ML IJ SOLN
INTRAMUSCULAR | Status: AC
  Filled 2014-05-17: qty 1

## 2014-05-17 MED ORDER — GLYCOPYRROLATE 0.2 MG/ML IJ SOLN
INTRAMUSCULAR | Status: AC
  Filled 2014-05-17: qty 2

## 2014-05-17 MED ORDER — LACTATED RINGERS IV SOLN: INTRAVENOUS | Status: DC

## 2014-05-17 MED ORDER — CHLORHEXIDINE GLUCONATE 4 % EX LIQD: 60.0000 mL | Freq: Once | CUTANEOUS | Status: DC

## 2014-05-17 MED ORDER — ROCURONIUM BROMIDE 100 MG/10ML IV SOLN
INTRAVENOUS | Status: AC
  Filled 2014-05-17: qty 1

## 2014-05-17 MED ORDER — LEVOTHYROXINE SODIUM 75 MCG PO TABS
75.0000 ug | ORAL_TABLET | ORAL | Status: DC
  Administered 2014-05-19 – 2014-05-22 (×2): 75 ug via ORAL
  Filled 2014-05-17 (×4): qty 1

## 2014-05-17 MED ORDER — LACTATED RINGERS IV BOLUS (SEPSIS): 1000.0000 mL | Freq: Three times a day (TID) | INTRAVENOUS | Status: AC | PRN

## 2014-05-17 MED ORDER — HEPARIN SODIUM (PORCINE) 5000 UNIT/ML IJ SOLN
5000.0000 [IU] | Freq: Once | INTRAMUSCULAR | Status: AC
  Administered 2014-05-17: 5000 [IU] via SUBCUTANEOUS
  Filled 2014-05-17: qty 1

## 2014-05-17 MED ORDER — ALVIMOPAN 12 MG PO CAPS
12.0000 mg | ORAL_CAPSULE | Freq: Once | ORAL | Status: AC
  Administered 2014-05-17: 12 mg via ORAL
  Filled 2014-05-17: qty 1

## 2014-05-17 MED ORDER — SODIUM CHLORIDE 0.9 % IV SOLN
INTRAVENOUS | Status: DC
  Filled 2014-05-17: qty 6

## 2014-05-17 MED ORDER — PHENOL 1.4 % MT LIQD
2.0000 | OROMUCOSAL | Status: DC | PRN
Start: 2014-05-17 — End: 2014-05-23

## 2014-05-17 MED ORDER — DIPHENHYDRAMINE HCL 50 MG/ML IJ SOLN: 12.5000 mg | Freq: Four times a day (QID) | INTRAMUSCULAR | Status: DC | PRN

## 2014-05-17 MED ORDER — LEVOTHYROXINE SODIUM 25 MCG PO TABS
25.0000 ug | ORAL_TABLET | ORAL | Status: DC
  Administered 2014-05-18 – 2014-05-23 (×4): 25 ug via ORAL
  Filled 2014-05-17 (×4): qty 1

## 2014-05-17 MED ORDER — FENTANYL CITRATE 0.05 MG/ML IJ SOLN
INTRAMUSCULAR | Status: AC
  Filled 2014-05-17: qty 2

## 2014-05-17 MED ORDER — LIP MEDEX EX OINT
1.0000 "application " | TOPICAL_OINTMENT | Freq: Two times a day (BID) | CUTANEOUS | Status: DC
  Administered 2014-05-17 – 2014-05-23 (×11): 1 via TOPICAL
  Filled 2014-05-17: qty 7

## 2014-05-17 MED ORDER — PROPOFOL 10 MG/ML IV BOLUS
INTRAVENOUS | Status: AC
  Filled 2014-05-17: qty 20

## 2014-05-17 MED ORDER — ALUM & MAG HYDROXIDE-SIMETH 200-200-20 MG/5ML PO SUSP: 30.0000 mL | Freq: Four times a day (QID) | ORAL | Status: DC | PRN

## 2014-05-17 MED ORDER — HEPARIN SODIUM (PORCINE) 5000 UNIT/ML IJ SOLN
5000.0000 [IU] | Freq: Three times a day (TID) | INTRAMUSCULAR | Status: DC
  Administered 2014-05-17 – 2014-05-20 (×8): 5000 [IU] via SUBCUTANEOUS
  Filled 2014-05-17 (×11): qty 1

## 2014-05-17 MED ORDER — FENTANYL CITRATE 0.05 MG/ML IJ SOLN
25.0000 ug | INTRAMUSCULAR | Status: DC | PRN
  Administered 2014-05-17: 50 ug via INTRAVENOUS

## 2014-05-17 MED ORDER — SODIUM CHLORIDE 0.9 % IJ SOLN
INTRAMUSCULAR | Status: AC
  Filled 2014-05-17: qty 10

## 2014-05-17 MED ORDER — SERTRALINE HCL 25 MG PO TABS
25.0000 mg | ORAL_TABLET | Freq: Every day | ORAL | Status: DC
  Administered 2014-05-18 – 2014-05-21 (×4): 25 mg via ORAL
  Filled 2014-05-17 (×6): qty 1

## 2014-05-17 MED ORDER — CLINDAMYCIN PHOSPHATE 900 MG/50ML IV SOLN
INTRAVENOUS | Status: AC
  Filled 2014-05-17: qty 50

## 2014-05-17 MED ORDER — NEOSTIGMINE METHYLSULFATE 10 MG/10ML IV SOLN
INTRAVENOUS | Status: DC | PRN
  Administered 2014-05-17: 2.5 mg via INTRAVENOUS

## 2014-05-17 MED ORDER — SODIUM CHLORIDE 0.9 % IV SOLN
INTRAVENOUS | Status: DC | PRN
  Administered 2014-05-17: 1000 mL via INTRAPERITONEAL

## 2014-05-17 MED ORDER — PROPOFOL 10 MG/ML IV BOLUS
INTRAVENOUS | Status: DC | PRN
  Administered 2014-05-17: 60 mg via INTRAVENOUS

## 2014-05-17 MED ORDER — GENTAMICIN SULFATE 40 MG/ML IJ SOLN
5.0000 mg/kg | INTRAVENOUS | Status: AC
  Administered 2014-05-17: 260 mg via INTRAVENOUS
  Filled 2014-05-17: qty 6.5

## 2014-05-17 MED ORDER — HYDROCODONE-ACETAMINOPHEN 5-325 MG PO TABS: 1.0000 | ORAL_TABLET | Freq: Four times a day (QID) | ORAL | Status: DC | PRN

## 2014-05-17 MED ORDER — FENTANYL CITRATE 0.05 MG/ML IJ SOLN
INTRAMUSCULAR | Status: AC
  Filled 2014-05-17: qty 5

## 2014-05-17 MED ORDER — GLYCOPYRROLATE 0.2 MG/ML IJ SOLN
INTRAMUSCULAR | Status: DC | PRN
  Administered 2014-05-17: 0.2 mg via INTRAVENOUS
  Administered 2014-05-17: .3 mg via INTRAVENOUS

## 2014-05-17 MED ORDER — SODIUM CHLORIDE 0.9 % IV SOLN
INTRAVENOUS | Status: DC
  Administered 2014-05-17 – 2014-05-18 (×2): via INTRAVENOUS

## 2014-05-17 MED ORDER — LIDOCAINE HCL (CARDIAC) 20 MG/ML IV SOLN
INTRAVENOUS | Status: DC | PRN
  Administered 2014-05-17: 50 mg via INTRAVENOUS

## 2014-05-17 SURGICAL SUPPLY — 120 items
APPLIER CLIP 5 13 M/L LIGAMAX5 (MISCELLANEOUS)
APPLIER CLIP ROT 10 11.4 M/L (STAPLE)
APR CLP MED LRG 11.4X10 (STAPLE)
APR CLP MED LRG 5 ANG JAW (MISCELLANEOUS)
BLADE EXTENDED COATED 6.5IN (ELECTRODE) ×4 IMPLANT
BLADE SURG SZ11 CARB STEEL (BLADE) ×4 IMPLANT
CABLE HIGH FREQUENCY MONO STRZ (ELECTRODE) IMPLANT
CANNULA REDUC XI 12-8 STAPL (CANNULA) ×1
CANNULA REDUC XI 12-8MM STAPL (CANNULA) ×1
CANNULA REDUCER 12-8 DVNC XI (CANNULA) ×1 IMPLANT
CATH KIT ON-Q SILVERSOAK 7.5 (CATHETERS) IMPLANT
CATH KIT ON-Q SILVERSOAK 7.5IN (CATHETERS) IMPLANT
CATH ROBINSON RED A/P 22FR (CATHETERS) IMPLANT
CELLS DAT CNTRL 66122 CELL SVR (MISCELLANEOUS) IMPLANT
CLIP APPLIE 5 13 M/L LIGAMAX5 (MISCELLANEOUS) IMPLANT
CLIP APPLIE ROT 10 11.4 M/L (STAPLE) IMPLANT
CLIP LIGATING HEM O LOK PURPLE (MISCELLANEOUS) ×3 IMPLANT
CLIP LIGATING HEMO O LOK GREEN (MISCELLANEOUS) IMPLANT
CLIP LIGATING HEMOLOK MED (MISCELLANEOUS) IMPLANT
COVER TIP SHEARS 8 DVNC (MISCELLANEOUS) ×2 IMPLANT
COVER TIP SHEARS 8MM DA VINCI (MISCELLANEOUS) ×2
DECANTER SPIKE VIAL GLASS SM (MISCELLANEOUS) ×2 IMPLANT
DEVICE TROCAR PUNCTURE CLOSURE (ENDOMECHANICALS) IMPLANT
DRAIN CHANNEL 19F RND (DRAIN) ×3 IMPLANT
DRAPE ARM DVNC X/XI (DISPOSABLE) ×8 IMPLANT
DRAPE COLUMN DVNC XI (DISPOSABLE) ×2 IMPLANT
DRAPE DA VINCI XI ARM (DISPOSABLE) ×8
DRAPE DA VINCI XI COLUMN (DISPOSABLE) ×2
DRAPE SURG IRRIG POUCH 19X23 (DRAPES) ×4 IMPLANT
DRSG OPSITE POSTOP 4X10 (GAUZE/BANDAGES/DRESSINGS) IMPLANT
DRSG OPSITE POSTOP 4X6 (GAUZE/BANDAGES/DRESSINGS) IMPLANT
DRSG OPSITE POSTOP 4X8 (GAUZE/BANDAGES/DRESSINGS) IMPLANT
DRSG TEGADERM 2-3/8X2-3/4 SM (GAUZE/BANDAGES/DRESSINGS) ×12 IMPLANT
DRSG TEGADERM 4X4.75 (GAUZE/BANDAGES/DRESSINGS) ×3 IMPLANT
ELECT PENCIL ROCKER SW 15FT (MISCELLANEOUS) ×8 IMPLANT
ELECT REM PT RETURN 9FT ADLT (ELECTROSURGICAL) ×4
ELECTRODE REM PT RTRN 9FT ADLT (ELECTROSURGICAL) ×2 IMPLANT
ENDOLOOP SUT PDS II  0 18 (SUTURE)
ENDOLOOP SUT PDS II 0 18 (SUTURE) IMPLANT
EVACUATOR SILICONE 100CC (DRAIN) ×3 IMPLANT
GAUZE SPONGE 2X2 8PLY STRL LF (GAUZE/BANDAGES/DRESSINGS) ×2 IMPLANT
GAUZE SPONGE 4X4 12PLY STRL (GAUZE/BANDAGES/DRESSINGS) IMPLANT
GLOVE BIOGEL PI IND STRL 6.5 (GLOVE) ×2 IMPLANT
GLOVE BIOGEL PI IND STRL 7.0 (GLOVE) ×1 IMPLANT
GLOVE BIOGEL PI IND STRL 8 (GLOVE) ×2 IMPLANT
GLOVE BIOGEL PI INDICATOR 6.5 (GLOVE) ×4
GLOVE BIOGEL PI INDICATOR 7.0 (GLOVE) ×2
GLOVE BIOGEL PI INDICATOR 8 (GLOVE) ×4
GLOVE ECLIPSE 6.5 STRL STRAW (GLOVE) ×3 IMPLANT
GLOVE ECLIPSE 8.0 STRL XLNG CF (GLOVE) ×24 IMPLANT
GLOVE INDICATOR 8.0 STRL GRN (GLOVE) ×18 IMPLANT
GLOVE SURG SS PI 6.5 STRL IVOR (GLOVE) ×3 IMPLANT
GLOVE SURG SS PI 7.5 STRL IVOR (GLOVE) ×6 IMPLANT
GOWN STRL REUS W/TWL XL LVL3 (GOWN DISPOSABLE) ×22 IMPLANT
KIT PROCEDURE DA VINCI SI (MISCELLANEOUS)
KIT PROCEDURE DVNC SI (MISCELLANEOUS) ×1 IMPLANT
LEGGING LITHOTOMY PAIR STRL (DRAPES) ×4 IMPLANT
LUBRICANT JELLY K Y 4OZ (MISCELLANEOUS) ×3 IMPLANT
NDL INSUFFLATION 14GA 120MM (NEEDLE) ×1 IMPLANT
NEEDLE INSUFFLATION 14GA 120MM (NEEDLE) ×4 IMPLANT
PACK CARDIOVASCULAR III (CUSTOM PROCEDURE TRAY) ×4 IMPLANT
PACK COLON (CUSTOM PROCEDURE TRAY) ×4 IMPLANT
PEN SKIN MARKING BROAD (MISCELLANEOUS) ×4 IMPLANT
PORT LAP GEL ALEXIS MED 5-9CM (MISCELLANEOUS) IMPLANT
PUMP PAIN ON-Q (MISCELLANEOUS) IMPLANT
RELOAD STAPLE 45 BLU REG DVNC (STAPLE) IMPLANT
RELOAD STAPLE 45 GRN THCK DVNC (STAPLE) IMPLANT
RETRACTOR WND ALEXIS 18 MED (MISCELLANEOUS) IMPLANT
RTRCTR WOUND ALEXIS 18CM MED (MISCELLANEOUS)
SCISSORS LAP 5X45 EPIX DISP (ENDOMECHANICALS) ×3 IMPLANT
SCRUB PCMX 4 OZ (MISCELLANEOUS) ×7 IMPLANT
SEAL CANN UNIV 5-8 DVNC XI (MISCELLANEOUS) ×6 IMPLANT
SEAL XI 5MM-8MM UNIVERSAL (MISCELLANEOUS) ×6
SEALER TISSUE G2 STRG ARTC 35C (ENDOMECHANICALS) IMPLANT
SEALER VESSEL DA VINCI XI (MISCELLANEOUS) ×2
SEALER VESSEL EXT DVNC XI (MISCELLANEOUS) ×1 IMPLANT
SET IRRIG TUBING LAPAROSCOPIC (IRRIGATION / IRRIGATOR) ×4 IMPLANT
SLEEVE XCEL OPT CAN 5 100 (ENDOMECHANICALS) IMPLANT
SOLUTION ELECTROLUBE (MISCELLANEOUS) ×4 IMPLANT
SPONGE GAUZE 2X2 STER 10/PKG (GAUZE/BANDAGES/DRESSINGS) ×2
STAPLER 45 BLU RELOAD XI (STAPLE) IMPLANT
STAPLER 45 BLUE RELOAD XI (STAPLE)
STAPLER 45 GREEN RELOAD XI (STAPLE)
STAPLER 45 GRN RELOAD XI (STAPLE) IMPLANT
STAPLER CANNULA SEAL DVNC XI (STAPLE) ×2 IMPLANT
STAPLER CANNULA SEAL XI (STAPLE) ×2
STAPLER PROXIMATE 75MM BLUE (STAPLE) ×3 IMPLANT
STAPLER SHEATH (SHEATH)
STAPLER SHEATH ENDOWRIST DVNC (SHEATH) IMPLANT
SUT MNCRL AB 4-0 PS2 18 (SUTURE) ×7 IMPLANT
SUT PDS AB 0 CT1 36 (SUTURE) ×3 IMPLANT
SUT PDS AB 1 CTX 36 (SUTURE) ×8 IMPLANT
SUT PDS AB 1 TP1 96 (SUTURE) IMPLANT
SUT PDS AB 2-0 CT2 27 (SUTURE) IMPLANT
SUT PROLENE 0 CT 2 (SUTURE) ×4 IMPLANT
SUT PROLENE 2 0 SH DA (SUTURE) IMPLANT
SUT SILK 2 0 (SUTURE) ×4
SUT SILK 2 0 SH CR/8 (SUTURE) ×4 IMPLANT
SUT SILK 2-0 18XBRD TIE 12 (SUTURE) ×2 IMPLANT
SUT SILK 3 0 (SUTURE) ×4
SUT SILK 3 0 SH CR/8 (SUTURE) ×7 IMPLANT
SUT SILK 3-0 18XBRD TIE 12 (SUTURE) ×2 IMPLANT
SUT V-LOC BARB 180 2/0GR6 GS22 (SUTURE)
SUT VIC AB 3-0 SH 18 (SUTURE) ×3 IMPLANT
SUT VIC AB 3-0 SH 27 (SUTURE)
SUT VIC AB 3-0 SH 27XBRD (SUTURE) IMPLANT
SUT VICRYL 0 UR6 27IN ABS (SUTURE) ×4 IMPLANT
SUT VLOC 180 2-0 9IN GS21 (SUTURE) IMPLANT
SUTURE V-LC BRB 180 2/0GR6GS22 (SUTURE) IMPLANT
SYRINGE 10CC LL (SYRINGE) ×1 IMPLANT
SYS LAPSCP GELPORT 120MM (MISCELLANEOUS) ×4
SYSTEM LAPSCP GELPORT 120MM (MISCELLANEOUS) ×1 IMPLANT
TAPE UMBILICAL COTTON 1/8X30 (MISCELLANEOUS) ×4 IMPLANT
TOWEL OR NON WOVEN STRL DISP B (DISPOSABLE) ×4 IMPLANT
TRAY FOLEY CATH 14FRSI W/METER (CATHETERS) ×4 IMPLANT
TROCAR BLADELESS OPT 5 100 (ENDOMECHANICALS) ×4 IMPLANT
TUBING CONNECTING 10 (TUBING) IMPLANT
TUBING CONNECTING 10' (TUBING)
TUBING FILTER THERMOFLATOR (ELECTROSURGICAL) ×4 IMPLANT
TUNNELER SHEATH ON-Q 16GX12 DP (PAIN MANAGEMENT) IMPLANT

## 2014-05-17 NOTE — Progress Notes (Signed)
May 17, 2014/Juliahna Wiswell L. Rosana Hoes, RN, BSN, CCM. Case Management [place] [phone] No discharge needs present of time of review.

## 2014-05-17 NOTE — H&P (Signed)
Bethany [name] 03/13/2014 11:26 AM Location: [facility] Patient #: [id number] DOB: 1928-03-17 Single / Language: [name] / Race: White Female  Patient Care Team: [name], MD as PCP - General (Internal Medicine) Carol Ada, MD as Referring Physician (Gastroenterology) Arloa Koh, MD as Consulting Physician (Radiation Oncology) Michael Boston, MD as Consulting Physician (General Surgery) Wyatt Portela, MD as Consulting Physician (Oncology) Adrian Prows, MD as Consulting Physician (Cardiology)  History of Present Illness  Patient words: recal reck.  The patient is a 81 year old female who presents with colorectal cancer. Patient comes in today anxious and a little down hearted. Patient diagnosed with rectal cancer back in August.. Underwent neoadjuvant chemoradiation therapy. Repleted radiation therapy but missed the last pills of her Xeloda due to worsening diarrhea.. Diarrhea issues. Admitted. Develop C. difficile. Develop pulmonary embolism. Anticoagulated. Went to a nursing facility for rehabilitation. [facility] in mid December. Negative for fracture. Persistent C. difficile. Given Flagyl. UTI. Treated. Eventually transition home. She lost a lot of weight. She is gained 8 pounds back now. She walks with a walker. Feels some numbness in her left anterior thigh. Concern for weakness as well. Scared that she will fall. Scared to get up. Eventually is reassured. She saw medical oncology. No change in size of solitary liver lesion on right. He eating okay. Trying to move her bowels. No nausea or vomiting.  Despite her of course, she feels that she has stabilized.  She wishes to be aggressive and proceed with resection of the primary to avoid further bowel and pelvic pain issues.  She is been cleared by her cardiologist, Dr.Ganji.  Allergies (Sonya Bynum, CMA; 03/13/2014 11:27 AM) Diclofenac *ANALGESICS - ANTI-INFLAMMATORY* Cephalothin in D5W *CEPHALOSPORINS*  Shortness of breath. Erythromycin Stearate *MACROLIDES* Shortness of breath. Keflex *CEPHALOSPORINS* Shortness of breath. Codeine Sulfate *ANALGESICS - OPIOID* Nausea, Vomiting. Darvocet A500 *ANALGESICS - OPIOID* Naproxen *ANALGESICS - ANTI-INFLAMMATORY* Penicillamine *ASSORTED CLASSES* Synalgos DC *ANALGESICS - OPIOID* TraMADol HCl *ANALGESICS - OPIOID*  Medication History (Alisha Spillers, MA; 03/13/2014 2:35 PM) Aspirin EC (81MG  Tablet DR, Oral daily) Active. Levothyroxine Sodium (25MCG Tablet, Oral Mon,Wed,Fri, and Sun) Active. Levothyroxine Sodium (75MCG Tablet, Oral Tues, Thurs and Sat) Active. Vitamin D (1000UNIT Tablet, Oral two times daily) Active. Xarelto (10MG  Tablet, Oral) Active. Medications Reconciled  Vitals (Sonya Bynum CMA; 03/13/2014 11:26 AM) 03/13/2014 11:26 AM Weight: 119 lb Height: 65in Body Surface Area: 1.57 m Body Mass Index: 19.8 kg/m Temp.: 97.87F(Temporal)  Pulse: 72 (Regular)  BP: 142/74 (Sitting, Left Arm, Standard)    Physical Exam Adin Hector MD; 03/13/2014 6:38 PM) General Mental Status-Alert. General Appearance-Not in acute distress, Not Sickly. Orientation-Oriented X3. Hydration-Well hydrated. Voice-Normal.  Integumentary Global Assessment Upon inspection and palpation of skin surfaces of the - Axillae: non-tender, no inflammation or ulceration, no drainage. and Distribution of scalp and body hair is normal. General Characteristics Temperature - normal warmth is noted.  Head and Neck Head-normocephalic, atraumatic with no lesions or palpable masses. Face Global Assessment - atraumatic, no absence of expression. Neck Global Assessment - no abnormal movements, no bruit auscultated on the right, no bruit auscultated on the left, no decreased range of motion, non-tender. Trachea-midline. Thyroid Gland Characteristics - non-tender.  Eye Eyeball - Left-Extraocular movements intact, No  Nystagmus. Eyeball - Right-Extraocular movements intact, No Nystagmus. Cornea - Left-No Hazy. Cornea - Right-No Hazy. Sclera/Conjunctiva - Left-No scleral icterus, No Discharge. Sclera/Conjunctiva - Right-No scleral icterus, No Discharge. Pupil - Left-Direct reaction to light normal. Pupil - Right-Direct reaction to light normal.  ENMT Ears Pinna - Left - no drainage observed, no generalized tenderness observed. Right - no drainage observed, no generalized tenderness observed. Nose and Sinuses External Inspection of the Nose - no destructive lesion observed. Inspection of the nares - Left - quiet respiration. Right - quiet respiration. Mouth and Throat Lips - Upper Lip - no fissures observed, no pallor noted. Lower Lip - no fissures observed, no pallor noted. Nasopharynx - no discharge present. Oral Cavity/Oropharynx - Tongue - no dryness observed. Oral Mucosa - no cyanosis observed. Hypopharynx - no evidence of airway distress observed. Note: Very tremulous speech. Note: Still has rather tremulous voice.   Chest and Lung Exam Inspection Movements - Normal and Symmetrical. Accessory muscles - No use of accessory muscles in breathing. Palpation Palpation of the chest reveals - Non-tender. Auscultation Breath sounds - Normal and Clear.  Cardiovascular Auscultation Rhythm - Regular. Murmurs & Other Heart Sounds - Auscultation of the heart reveals - No Murmurs and No Systolic Clicks.  Abdomen Inspection Inspection of the abdomen reveals - No Visible peristalsis and No Abnormal pulsations. Umbilicus - No Bleeding, No Urine drainage. Palpation/Percussion Palpation and Percussion of the abdomen reveal - Soft, Non Tender, No Rebound tenderness, No Rigidity (guarding) and No Cutaneous hyperesthesia.  Female Genitourinary Sexual Maturity Tanner 5 - Adult hair pattern. Note: No vaginal bleeding nor discharge   Rectal Note: Good hygiene. Minimal soilage. A few  small anterior external hemorrhoid tags. Normal sphincter tone. Sphincter intact.   Circumferential nearly obstructing mass in the mid rectum. More mobile posteriorly but bulky 8 cm from anal verge. Caliber barely allows finger to pass. Rectovaginal septum intact on vaginal side. Sensitive   Peripheral Vascular Upper Extremity Inspection - Left - No Cyanotic nailbeds, Not Ischemic. Right - No Cyanotic nailbeds, Not Ischemic.  Neurologic Neurologic evaluation reveals -normal attention span and ability to concentrate, able to name objects and repeat phrases. Appropriate fund of knowledge , normal sensation and normal coordination. Mental Status Affect - not angry, not paranoid. Cranial Nerves-Normal Bilaterally. Gait-Normal.  Neuropsychiatric Mental status exam performed with findings of-thought content normal with ability to perform basic computations and apply abstract reasoning and no evidence of hallucinations, delusions, obsessions or homicidal/suicidal ideation. The patient's mood and affect are described as -anxious, not angry. Judgment and Insight-insight is appropriate concerning matters relevant to self. Speech -Note: Tremulous/warbled.  Note: Very anxious. Ultimately consolable.   Musculoskeletal Global Assessment Spine, Ribs and Pelvis - no instability, subluxation or laxity. Right Upper Extremity - no instability, subluxation or laxity.  Lymphatic Head & Neck  General Head & Neck Lymphatics: Bilateral - Description - No Localized lymphadenopathy. Axillary  General Axillary Region: Bilateral - Description - No Localized lymphadenopathy. Femoral & Inguinal  Generalized Femoral & Inguinal Lymphatics: Left - Description - No Localized lymphadenopathy. Right - Description - No Localized lymphadenopathy.    Assessment & Plan Adin Hector MD; 03/13/2014 6:47 PM) RECTAL CANCER METASTASIZED TO LIVER (154.1  C20) Impression: I had long  discussion with the patient & her nephew concerning the pathophysiology of rectal cancer.  Standard of care would be low anterior resection 8-12 weeks after neoadj chemotherapy that would've been late December to mid January. She is past that ideal window. She is very deconditioned and anxious. I worry about her ability to tolerate an operation. However I worry about her lack of good pathological response. I think it is smaller now but still nearly obstructing. She is fully anticoagulated. Risk of bleeding as well. I think her  risk of obstruction or bleeding is evident. Would benefit from palliative resection. I am worried that diverting colostomy not be adequate. Probably not a massive increase risk to proceed with low anterior resection as long as not too long. However, I think she is too deconditioned and weak to attempt anastomosis. I would give her a colostomy. d/w Dr Leighton Ruff whom agrees. . She's already had severe diarrhea issues with her Xeloda. I worry that she cannot tolerate an ileostomy. Patient relented.   She wishes to be aggressive and proceed with surgery. She does not want to risk obstruction or worsening symptoms. I cautioned her that her risks of death and perioperative complications are higher now. She was considered low cardiac risk in September. Cleared again by Dr Einar Gip. However if we can do this in a minimally invasive approach and do a palliative resection, avoid less pain and wound issues. Try not to keep her on the table too long even a minimally invasive would at some time. She wishes to be aggressive and proceed with surgery. I would like her to get stronger first. If she can do that, then proceed with surgery and a month or so. I noted if she will not get upper be active and are not eat, I am worried about putting her through distress of an operation. She promised to do that.  I think she is rather depressed and anxious. She had been recommended to start Zoloft. She did not  want to consider it.   Current Plans  Instructions:  If you get stronger, we will consider minimally invasive low anterior resection for rectal cancer with permanent colostomy.  Would like cardiologist to make sure it is safe to come off throughout so and no other issues. Have him recheck since cleared in September.  Tentatively plan in late March/April to allow you to have time to recover from recent hospitalizations and be further out from episode of pulmonary embolism. Schedule for Surgery The anatomy & physiology of the digestive tract was discussed. The pathophysiology of the rectal pathology was discussed. Natural history risks without surgery was discussed. I worked to give an overview of the disease and the frequent need to have multispecialty involvement. I feel the risks of no intervention will lead to serious problems that outweigh the operative risks; therefore, I recommended a partial proctocolectomy to remove the pathology. Minimally Invasive (Robotic/Laparoscopic) & open techniques were discussed. We will work to preserve anal & pelvic floor function without sacrificing cure.  Risks such as bleeding, infection, abscess, leak, reoperation, possible temporary or permanent ostomy, hernia, heart attack, death, and other risks were discussed. I noted a good likelihood this will help address the problem. Goals of post-operative recovery were discussed as well. We will work to minimize complications. Educational information was available as well. Questions were answered. The patient expresses understanding & wishes to proceed with surgery. Pt Education - [facility] Ostomy [facility]CI (Demitrius Crass) Pt Education - [facility] Abdominal Surgery (Debroah Shuttleworth) Pt Education - [place] (Lindzy Rupert) Pt Education - [facility] Pelvic Floor Exercises (Kegels) and Dysfunction [facility]CI (Latesa Fratto) Pt Education - [facility] Colon Bowel Prep 2015 Miralax/Antibiotics Started Neomycin Sulfate 500MG , 2 (two) Tablet SEE NOTE, #6, 03/13/2014, No  Refill. Local Order: TAKE TWO TABLETS AT 2 PM, 3 PM, AND 10 PM THE DAY PRIOR TO SURGERY Started Flagyl 500MG , 2 (two) Tablet SEE NOTE, #6, 03/13/2014, No Refill. Local Order: Take at 2pm, 3pm, and 10pm the day prior to your colon operation  Adin Hector, M.D.,  F.A.C.S. Gastrointestinal and Minimally Invasive Surgery [facility] [address] [place], [place] [address] [phone] Main / Paging

## 2014-05-17 NOTE — Anesthesia Procedure Notes (Signed)
Procedure Name: Intubation Date/Time: 05/17/2014 7:40 AM Performed by: Carleene Cooper A Pre-anesthesia Checklist: Timeout performed, Emergency Drugs available, Patient identified, Suction available and Patient being monitored Patient Re-evaluated:Patient Re-evaluated prior to inductionOxygen Delivery Method: Circle system utilized Preoxygenation: Pre-oxygenation with 100% oxygen Intubation Type: IV induction Ventilation: Mask ventilation without difficulty Laryngoscope Size: Mac and 3 Grade View: Grade I Tube type: Oral Tube size: 7.0 mm Number of attempts: 1 Airway Equipment and Method: Stylet Placement Confirmation: ETT inserted through vocal cords under direct vision,  breath sounds checked- equal and bilateral and positive ETCO2 Secured at: 20 cm Tube secured with: Tape Dental Injury: Teeth and Oropharynx as per pre-operative assessment

## 2014-05-17 NOTE — Anesthesia Postprocedure Evaluation (Addendum)
  Anesthesia Post-op Note  Patient: Bethany [name]  Procedure(s) Performed: Procedure(s): XI ROBOTIC ASSISTED RECTOSIGMOID LOW ANTERIOR RESECTION AND COLOSTOMY  (N/A)  Patient Location: PACU  Anesthesia Type:General   Level of Consciousness: awake, alert  and sedated  Airway and Oxygen Therapy: Patient Spontanous Breathing and Patient connected to nasal cannula oxygen  Post-op Pain: mild  Post-op Assessment: Post-op Vital signs reviewed, Patient's Cardiovascular Status Stable, Respiratory Function Stable, Patent Airway, No signs of Nausea or vomiting and Pain level controlled. Patient had some frequent PACs and short runs of what appeared to be A. Fib. 12 lead EKG with rhythm strip just showed PAC's.  Post-op Vital Signs: Reviewed and stable  Last Vitals:  Filed Vitals:   05/17/14 1330  BP: 139/62  Pulse: 83  Temp:   Resp: 18    Complications: No apparent anesthesia complications

## 2014-05-17 NOTE — Anesthesia Preprocedure Evaluation (Addendum)
Anesthesia Evaluation  Patient identified by MRN, date of birth, ID band Patient awake    Reviewed: Allergy & Precautions, NPO status , Patient's Chart, lab work & pertinent test results  Airway        Dental   Pulmonary neg pulmonary ROS,          Cardiovascular hypertension, Pt. on medications  Hx/o Acute Cor pulmonale   Neuro/Psych  Headaches, PSYCHIATRIC DISORDERS Anxiety Depression    GI/Hepatic GERD-  Medicated,Rectal Ca   Endo/Other  Hypothyroidism   Renal/GU    Urinary incontinence    Musculoskeletal  (+) Arthritis -,   Abdominal   Peds  Hematology  (+) anemia , On Xarelto- last dose 4/4   Anesthesia Other Findings   Reproductive/Obstetrics                           Anesthesia Physical Anesthesia Plan  ASA: III  Anesthesia Plan: General   Post-op Pain Management:    Induction: Intravenous  Airway Management Planned: Oral ETT  Additional Equipment:   Intra-op Plan:   Post-operative Plan: Extubation in OR  Informed Consent: I have reviewed the patients History and Physical, chart, labs and discussed the procedure including the risks, benefits and alternatives for the proposed anesthesia with the patient or authorized representative who has indicated his/her understanding and acceptance.   Dental advisory given  Plan Discussed with: Anesthesiologist, CRNA and Surgeon  Anesthesia Plan Comments:         Anesthesia Quick Evaluation

## 2014-05-17 NOTE — Op Note (Signed)
05/17/2014  12:20 PM  PATIENT:  Bethany [name]  81 y.o. female  Patient Care Team: [name], MD as PCP - General (Internal Medicine) Carol Ada, MD as Referring Physician (Gastroenterology) Arloa Koh, MD as Consulting Physician (Radiation Oncology) Michael Boston, MD as Consulting Physician (General Surgery) Wyatt Portela, MD as Consulting Physician (Oncology) Adrian Prows, MD as Consulting Physician (Cardiology)  PRE-OPERATIVE DIAGNOSIS:  Rectal Cancer  POST-OPERATIVE DIAGNOSIS:    Rectal Cancer Right ovarian mass  PROCEDURE:  Procedure(s): XI ROBOTIC ASSISTED RECTOSIGMOID LOW ANTERIOR RESECTION END COLOSTOMY  XI ROBOTIC ASSISTED SALPINGOOOPHERECTOMY  SURGEON:  Surgeon(s): Michael Boston, MD  ASSISTANT: Varney Baas, PA-C, [employer]   ANESTHESIA:   local and general  EBL:  Total I/O In: 1000 [I.V.:1000] Out: 850 [Urine:750; Blood:100]  Delay start of Pharmacological VTE agent (>24hrs) due to surgical blood loss or risk of bleeding:  no  DRAINS: (19Fr) Blake drain(s) in the drain   SPECIMENS:    1.  Rectosigmoid colon (silk stitch proximal) 2.  Right ovary & fallopian tube  DISPOSITION OF SPECIMEN:  PATHOLOGY  COUNTS:  YES  PLAN OF CARE: Admit to inpatient   PATIENT DISPOSITION:  PACU - hemodynamically stable.  INDICATION:    Only woman diagnosed with near obstructing rectal cancer last fall.  Underwent neoadjuvant chemoradiation therapy.  Developed problems with diarrhea,C. Difficile colitis, pulmonary embolism.  Deconditioned.  Eventually stabilized and improved.  Does have liver metastasis but small and solitary.  No rapid progression. Because of persistent obstructive symptoms, she wished to consider surgery for palliative resection.    I recommended segmental resection:  The anatomy & physiology of the digestive tract was discussed.  The pathophysiology was discussed.  Natural history risks without surgery was discussed.   I worked to give an overview  of the disease and the frequent need to have multispecialty involvement.  I feel the risks of no intervention will lead to serious problems that outweigh the operative risks; therefore, I recommended a partial colectomy to remove the pathology.  Laparoscopic & open techniques were discussed.    Risks such as bleeding, infection, abscess, leak, reoperation, possible ostomy, hernia, heart attack, death, and other risks were discussed.  I noted a good likelihood this will help address the problem.   Goals of post-operative recovery were discussed as well.  We will work to minimize complications.  Educational materials on the pathology had been given in the office.  Questions were answered.    The patient expressed understanding & wished to proceed with surgery.  OR FINDINGS:   Patient had bulky proximal to mid rectal cancer.   Inflammation in the mesial colon.   Non invasion into the vagina, bladder, or pelvic sidewall.  No obvious metastatic disease on visceral parietal peritoneum or liver.  Lobulated white enlarged ovary.  Mostly pedunculated.  Fallopian tube somewhat atrophic. Status post hysterectomy and left salpingo-oophorectomy already.  End descending colostomy.  Rectal stump only 1 cm from sphincters  DESCRIPTION:   Informed consent was confirmed.  The patient underwent general anaesthesia without difficulty.  The patient was positioned appropriately.  VTE prevention in place.  Perform digital rectal examination to confirm a very narrow stricture in the mid rectum with a bulky mass consistent with a persistent rectal cancer.  About 6 cm from the anal verge. The patient's abdomen was clipped, prepped, & draped in a sterile fashion.  Surgical timeout confirmed our plan.  The patient was positioned in reverse Trendelenburg.  Abdominal entry was gained using  Varess technique with a trach hook on the anterior abdominal wall fascia in the left upper abdomen.  Entry was clean.  I induced carbon  dioxide insufflation.  Camera inspection revealed no injury.  Extra ports were carefully placed under direct laparoscopic visualization.  Patient had moderate greater omental adhesions to the anterior abdominal wall.  I carefully freed those off using laparoscopic cold scissors and focused blunt dissection.  i freed a loop of ileum off the vaginal cuff stump.  Inspected balance on no injury.  I reflected the greater omentum and the upper abdomen the small bowel in the upper abdomen.  The patient was carefully positioned.  The Intuitive daVinci robot was carefully docked with camera & instruments carefully placed.  The patient had thickened rectosigmoid junction with tattooing going to the peritoneal reflection with some stricturing. Firm. The  Sigmoid colon was rather corkscrew and tortuous.   I scored the base of peritoneum of the medial side of the mesentery of the left colon from the ligament of Treitz to the peritoneal reflection of the mid rectum.   I elevated the sigmoid mesentery and entered into the retro-mesenteric plane. We were able to identify the left ureter and gonadal vessels. We kept those posterior within the retroperitoneum and elevated the left colon mesentery off that. I did isolate the inferior mesenteric artery (IMA) pedicle but did not ligate it yet.  I continued distally and got into the avascular plane posterior to the mesorectum. This allowed me to help mobilize the rectum as well by freeing the mesorectum off the sacrum.  I mobilized the peritoneal coverings towards the peritoneal reflection on both the right and left sides of the rectum.  I stayed away from the right and left ureters.    Patient had a large right ovarythat was flopping into the pelvis.  Pedunculated.  I removed the right ovary with the fallopian tube in layers off the right pelvic wall.  Isolated and transected the gonadal blood supply. Stayed away from the right ureter and other pelvic sidewall anatomy.  I  skeletonized the lymph nodes off the inferior mesenteric artery pedicle.  I went down to its takeoff from the aorta.  I isolated the inferior mesenteric vein off of the ligament of Treitz just cephalad to that as well.  After confirming the left ureter was out of the way, I went ahead and ligated the Superior hemorrhoidal artery off the inferior mesenteric artery pedicle.  I did ligate the inferior mesenteric vein in a similar fashion.  We ensured hemostasis. I skeletonized the mesorcolon Just distal to the left colic pedicle to the mid descending colon.  I mobilized the left colon in a lateral to medial fashion off the line of Toldt up towards the splenic flexure to ensure good mobilization of the remaining left colon to reach into the pelvis.  Procedure with mesial rectal excision.  Freed mesial rectum off the sacrum posteriorly.  It was rather fibrotic and edematous consistent with prior radiation.  Eventually came down and the plane.  These rectum was very scarred and somewhat folded and atrophic.  Adhesions to left posterior lateral mid sacrum.  Eventually freed that off and came into more soft distal mesial rectum consistent with being distal to the tumor.  I came around the peritoneal reflections on the right left side and anteriorly.  Carefully freed the anterior rectum off the rectovaginal septum and vaginal cuff.  I went distally.  I freed off the lateral pedicles as well. He was  stationed surely with bipolar cautery on a more prominent  perforators. Eventually came down to the pelvic floor circumferentially.  I had pretty good mesorectal excision  I transected at the distal rectum nearly flush with the pelvic floor using a robotic 45 mm stapler.  Came across 90%. Did one more firing for the left corner.  We did copious irrigation of the pelvis.  Hemostasis was good.  We did an irrigation of antibiotic solution (900 mg clindamycin/240 mg gentamicin in a liter of crystalloid)   Began to prepare the  colostomy.   I excised a 4 cm disc Of skin and subcutaneous tissueat the left upper quadrant paramedian premarked colostomy site.  I split the anterior rectusfascia transversely.  Slipped between the rectus muscles and through the peritoneum.  I placed a wound protector through the small opening.  We removed the ovary and fallopian tube out the wound.  I was able to eviscerate the rectosigmoid out the wound.   I transected at the descending/sigmoid junction with a GIA stapler.  We sent the rectosigmoid colon specimen off to go to pathology.  There was no teisting/tension of mesentery of the remaining colon.   Diagnosis laparoscopy.  Aspirated the antibiotic solution.  Sure hemostasis the pelvis elsewhere.  Ran small bowel. No evidence of injury.  Allowed the cecum to lay in the pelvis.   Placed drain in the pelvis of the right lower quadrant 12 mm paramedian port site.  Tissues looked viable.  Ureters & bowel uninjured.    Ports & wound protector removed.  We changed gloves.  I closed the 63mm port sites using Monocryl stitch and sterile dressing.  Drain secured at the skin with 2-0 Prolene.  I placed a sterile dressing.  I tightened the fascia at the colostomy wound with #1 PDS interrupted at the corners to tighten the fascia down.  I tacked the colon to the posterior rectus fascia using interrupted silk sutures to provida nice tight seal around the colostomy at the level of the peritoneum and posterior rectus fascia.  I then opened up the colostomy by transecting the staple line.  Healthy bleeding tissue. I was able to create a nice Brooke and descending colostomy with 3-0 Vicryl interrupted suture at the corners and edges.  Patient is being extubated go to recovery room. I had discussed postop care with the patient in detail the office & in the holding area. Instructions are written.  I'm did locate her nephew and discussed it with him as well.  Intraoperative findings, postoperative expectations, recovery she  is as discussed.  Questions answered.  He expressed understanding and appreciation  Adin Hector, M.D., F.A.C.S. Gastrointestinal and Minimally Invasive Surgery [facility] [address] [place], [place] [address] [phone] Main / Paging

## 2014-05-17 NOTE — Transfer of Care (Signed)
Immediate Anesthesia Transfer of Care Note  Patient: Bethany [name]  Procedure(s) Performed: Procedure(s): XI ROBOTIC ASSISTED RECTOSIGMOID LOW ANTERIOR RESECTION AND COLOSTOMY  (N/A)  Patient Location: PACU  Anesthesia Type:General  Level of Consciousness: awake, alert , oriented and patient cooperative  Airway & Oxygen Therapy: Patient Spontanous Breathing and Patient connected to face mask oxygen  Post-op Assessment: Report given to RN, Post -op Vital signs reviewed and stable and Patient moving all extremities  Post vital signs: Reviewed and stable  Last Vitals:  Filed Vitals:   05/17/14 0557  BP: 158/81  Pulse: 91  Temp: 36.3 C  Resp: 20    Complications: No apparent anesthesia complications

## 2014-05-18 ENCOUNTER — Encounter (HOSPITAL_COMMUNITY): Payer: Self-pay | Admitting: Surgery

## 2014-05-18 DIAGNOSIS — E876 Hypokalemia: Secondary | ICD-10-CM

## 2014-05-18 DIAGNOSIS — J383 Other diseases of vocal cords: Secondary | ICD-10-CM

## 2014-05-18 HISTORY — DX: Other diseases of vocal cords: J38.3

## 2014-05-18 LAB — CBC
HCT: 31 % — ABNORMAL LOW (ref 36.0–46.0)
Hemoglobin: 10.2 g/dL — ABNORMAL LOW (ref 12.0–15.0)
MCH: 31.3 pg (ref 26.0–34.0)
MCHC: 32.9 g/dL (ref 30.0–36.0)
MCV: 95.1 fL (ref 78.0–100.0)
Platelets: 139 10*3/uL — ABNORMAL LOW (ref 150–400)
RBC: 3.26 MIL/uL — ABNORMAL LOW (ref 3.87–5.11)
RDW: 13.1 % (ref 11.5–15.5)
WBC: 7.5 10*3/uL (ref 4.0–10.5)

## 2014-05-18 LAB — BASIC METABOLIC PANEL
ANION GAP: 6 (ref 5–15)
BUN: 14 mg/dL (ref 6–23)
CO2: 33 mmol/L — AB (ref 19–32)
Calcium: 8.4 mg/dL (ref 8.4–10.5)
Chloride: 102 mmol/L (ref 96–112)
Creatinine, Ser: 0.69 mg/dL (ref 0.50–1.10)
GFR calc Af Amer: >90 mL/min (ref >90)
GFR, EST NON AFRICAN AMERICAN: 78 mL/min — AB (ref >90)
Glucose, Bld: 102 mg/dL — ABNORMAL HIGH (ref 70–99)
Potassium: 3.1 mmol/L — ABNORMAL LOW (ref 3.5–5.1)
Sodium: 141 mmol/L (ref 135–145)

## 2014-05-18 LAB — MAGNESIUM: Magnesium: 1.4 mg/dL — ABNORMAL LOW (ref 1.5–2.5)

## 2014-05-18 LAB — MRSA PCR SCREENING: MRSA by PCR: NEGATIVE

## 2014-05-18 MED ORDER — METOPROLOL TARTRATE 1 MG/ML IV SOLN
5.0000 mg | Freq: Four times a day (QID) | INTRAVENOUS | Status: DC | PRN
  Filled 2014-05-18: qty 5

## 2014-05-18 MED ORDER — POTASSIUM CHLORIDE CRYS ER 20 MEQ PO TBCR
20.0000 meq | EXTENDED_RELEASE_TABLET | ORAL | Status: AC
  Administered 2014-05-18 (×3): 20 meq via ORAL
  Filled 2014-05-18 (×3): qty 1

## 2014-05-18 MED ORDER — MAGNESIUM SULFATE 2 GM/50ML IV SOLN
2.0000 g | Freq: Once | INTRAVENOUS | Status: AC
  Administered 2014-05-18: 2 g via INTRAVENOUS
  Filled 2014-05-18: qty 50

## 2014-05-18 MED ORDER — METOPROLOL TARTRATE 12.5 MG HALF TABLET
12.5000 mg | ORAL_TABLET | Freq: Two times a day (BID) | ORAL | Status: DC | PRN
  Filled 2014-05-18: qty 1

## 2014-05-18 MED ORDER — HYDROMORPHONE HCL 1 MG/ML IJ SOLN
0.5000 mg | INTRAMUSCULAR | Status: DC | PRN
  Administered 2014-05-18: 1 mg via INTRAVENOUS
  Administered 2014-05-18: 0.5 mg via INTRAVENOUS
  Administered 2014-05-22: 1 mg via INTRAVENOUS
  Filled 2014-05-18 (×3): qty 1

## 2014-05-18 MED ORDER — POTASSIUM CHLORIDE IN NACL 40-0.9 MEQ/L-% IV SOLN
INTRAVENOUS | Status: DC
  Administered 2014-05-18 – 2014-05-19 (×4): 75 mL/h via INTRAVENOUS
  Administered 2014-05-20 – 2014-05-21 (×2): 50 mL/h via INTRAVENOUS
  Filled 2014-05-18 (×7): qty 1000

## 2014-05-18 MED ORDER — POTASSIUM CHLORIDE 10 MEQ/50ML IV SOLN: 10.0000 meq | INTRAVENOUS | Status: DC | PRN

## 2014-05-18 NOTE — Evaluation (Signed)
Physical Therapy Evaluation Patient Details Name: Bethany [name] MRN: [id number] DOB: 25-May-1928 Today's Date: 05/18/2014   History of Present Illness  The patient is a 81 year old female who presents with colorectal cancer. . Underwent neoadjuvant chemoradiation therapy. recently in rehab. Persistent C. difficile.  UTI.  Feels some numbness in her left anterior thigh/has had neurology consult per nephew.. Admitted to procede with colostomy,appendectomy, and colectomy on 05/17/14  Clinical Impression  Patient c/o severe abdominal pain during transfer. Got to [facility] but unable to tolerate so returned to bed. HR 120's, RR hi 30's, sats 100% RA, encouraged relaxation and slow breaths. Patient will benefit from PT to address problems listed in note below. Nephew reports that MD is considering rehab if needed.     Follow Up Recommendations SNF;Supervision - Intermittent    Equipment Recommendations  None recommended by PT    Recommendations for Other Services OT consult     Precautions / Restrictions Precautions Precautions: Fall Precaution Comments: colostomy, R drain, PAIN      Mobility  Bed Mobility Overal bed mobility: Needs Assistance;+2 for physical assistance;+ 2 for safety/equipment Bed Mobility: Supine to Sit;Sit to Supine     Supine to sit: +2 for physical assistance;Mod assist;+2 for safety/equipment;HOB elevated Sit to supine: Max assist;+2 for physical assistance;+2 for safety/equipment;Independent   General bed mobility comments: patient moved legs to the edge, lifting trunk assist to upright, c/o severe abdominalpain, crying out  Transfers Overall transfer level: Needs assistance   Transfers: Sit to/from Stand;Stand Pivot Transfers Sit to Stand: Max assist;+2 physical assistance;+2 safety/equipment;From elevated surface Stand pivot transfers: +2 safety/equipment;+2 physical assistance;Max assist       General transfer comment: lifting assist for stand, bear hug pivot  to reclliner then back to bed as patient in too much pain and crying.  Ambulation/Gait                Stairs            Wheelchair Mobility    Modified Rankin (Stroke Patients Only)       Balance Overall balance assessment: Needs assistance;History of Falls Sitting-balance support: Feet supported;Bilateral upper extremity supported Sitting balance-Leahy Scale: Poor Sitting balance - Comments: guarding abdomen, assist for trunk support to alleviate pain                                     Pertinent Vitals/Pain Pain Assessment: Faces Pain Score: 10-Worst pain ever Pain Location: abdomen Pain Descriptors / Indicators: Crying;Constant;Contraction;Cramping Pain Intervention(s): Limited activity within patient's tolerance;Monitored during session;Repositioned;Patient requesting pain meds-RN notified    Home Living Family/patient expects to be discharged to:: Private residence Living Arrangements: Other relatives (nephew) Available Help at Discharge: Family Type of Home: House Home Access: Stairs to enter Entrance Stairs-Rails: None Entrance Stairs-Number of Steps: 2 Home Layout: One level Home Equipment: Environmental consultant - 2 wheels;Shower seat Additional Comments: nephew works at night. today he states he has taken off 1 month to care for aunt.    Prior Function Level of Independence: Needs assistance   Gait / Transfers Assistance Needed: with RW to bathroo. went to bed  @ 6PM and only got upto bathroom.  ADL's / Homemaking Assistance Needed: nephew handled housework        Hand Dominance        Extremity/Trunk Assessment   Upper Extremity Assessment: Generalized weakness  Lower Extremity Assessment: Generalized weakness;LLE deficits/detail   LLE Deficits / Details: impaired sensation anterior thigh. able to bear weight  Cervical / Trunk Assessment: Kyphotic  Communication      Cognition Arousal/Alertness: Awake/alert Behavior  During Therapy: Anxious Overall Cognitive Status: Within Functional Limits for tasks assessed                      General Comments      Exercises        Assessment/Plan    PT Assessment Patient needs continued PT services  PT Diagnosis Difficulty walking;Acute pain   PT Problem List Decreased strength;Decreased activity tolerance;Decreased balance;Decreased mobility;Pain;Cardiopulmonary status limiting activity  PT Treatment Interventions DME instruction;Gait training;Functional mobility training;Therapeutic activities;Therapeutic exercise;Patient/family education   PT Goals (Current goals can be found in the Care Plan section) Acute Rehab PT Goals Patient Stated Goal: per nephew - to go home if able. PT Goal Formulation: With patient/family Time For Goal Achievement: 06/01/14 Potential to Achieve Goals: Good    Frequency Min 3X/week   Barriers to discharge        Co-evaluation               End of Session   Activity Tolerance: No increased pain Patient left: in bed;with call bell/phone within reach;with family/visitor present Nurse Communication: Mobility status;Patient requests pain meds         Time: 0927-1000 PT Time Calculation (min) (ACUTE ONLY): 33 min   Charges:   PT Evaluation $Initial PT Evaluation Tier I: 1 Procedure PT Treatments $Therapeutic Activity: 8-22 mins   PT G Codes:        Claretha Cooper 05/18/2014, 1:40 PM Tresa Endo PT [phone]

## 2014-05-18 NOTE — Progress Notes (Signed)
[facility]  [address] [place], [place] [address] Phone: [phone] FAX: [phone]    Bethany [name] [id number] 09-Mar-1928  CARE TEAM:  PCP: [name], MD  Outpatient Care Team: Patient Care Team: [name], MD as PCP - General (Internal Medicine) Carol Ada, MD as Referring Physician (Gastroenterology) Arloa Koh, MD as Consulting Physician (Radiation Oncology) Michael Boston, MD as Consulting Physician (General Surgery) Wyatt Portela, MD as Consulting Physician (Oncology) Adrian Prows, MD as Consulting Physician (Cardiology)  Inpatient Treatment Team: Treatment Team: Attending Provider: Michael Boston, MD; Physical Therapist: Fuller Mandril, PT  Problem List:   Principal Problem:   Rectal cancer s/p LAResectin/colostomy 05/17/2014 Active Problems:   Pulmonary embolism   Gastroesophageal reflux disease   Hypertension   Ovarian mass, right, s/p robotic excision 05/17/2014   Hypothyroidism   Protein-calorie malnutrition, moderate   Rash and nonspecific skin eruption   Atrial fibrillation with RVR   Anemia in neoplastic disease   Anxiety and depression   Lumbar radicular pain   Rectal cancer metastasized to liver   Hypokalemia   Hypomagnesemia   1 Day Post-Op  Procedure(s): XI ROBOTIC ASSISTED RECTOSIGMOID LOW ANTERIOR RESECTION AND COLOSTOMY   Assessment  Stable  Plan:  -transfer to surgery floor  -adv diet per protocol - fulls this AM  -pain control  -kypoK - replace  -hypoMg - replace  -f/u pathology  -colostomy care/training  -depression - better w support.  Cont Zoloft  -VTE prophylaxis- SCDs, etc.  Heparin for now.  Start Xerelto tomorrow if doing well.  -mobilize as tolerated to help recovery - PT/OT evals.  Pt has strong desire to go directly home.  Nephew & I guarded.  I again noted pt has activity goals to make.  Hopefully she can w PT/OT/RN/CMA help  I updated the status of the patient to the  patient and the patient's RN.  I made recommendations.  I answered questions.  Understanding & appreciation was expressed.  Adin Hector, M.D., F.A.C.S. Gastrointestinal and Minimally Invasive Surgery [facility] [address] [place], [place] [address] [phone] Main / Paging   05/18/2014  Subjective:  Not much soreness until this AM No tele events Adv to full liquids this AM SDU RNs just outside room  Objective:  Vital signs:  Filed Vitals:   05/18/14 0200 05/18/14 0346 05/18/14 0400 05/18/14 0600  BP: 88/40  101/38 107/47  Pulse: 60  57 65  Temp:  98.3 F (36.8 C)    TempSrc:  Oral    Resp: 13  19 16   Height:      Weight:  55.8 kg (123 lb 0.3 oz)    SpO2: 100%  100% 100%    Last BM Date:  (colostomy)  Intake/Output   Yesterday:  04/07 0701 - 04/08 0700 In: 4220 [P.O.:180; I.V.:3940; IV Piggyback:100] Out: 4599 [Urine:2075; Drains:260; Stool:100; Blood:150] This shift:     Bowel function:  Flatus: y  BM: y, in bag  Drain: serosanguinous  Physical Exam:  General: Pt awake/alert/oriented x4 in no acute distress.  Smiling, alert Eyes: PERRL, normal EOM.  Sclera clear.  No icterus Neuro: CN II-XII intact w/o focal sensory/motor deficits. Lymph: No head/neck/groin lymphadenopathy Psych:  No delerium/psychosis/paranoia.  Smiling HENT: Normocephalic, Mucus membranes moist.  No thrush.  Still with warbled speech - baseline Neck: Supple, No tracheal deviation Chest: No chest wall pain w good excursion CV:  Pulses intact.  Regular rhythm  MS: Normal AROM mjr joints.  No obvious deformity Abdomen: Soft.  Nondistended.  Mildly tender at incisions only.  No evidence of peritonitis.  No incarcerated hernias.  Colostomy w gas & thick liquid brown stool Ext:  SCDs BLE.  No mjr edema.  No cyanosis Skin: No petechiae / purpura  Results:   Labs: Results for orders placed or performed during the hospital encounter of 05/17/14  (from the past 48 hour(s))  Basic metabolic panel     Status: Abnormal   Collection Time: 05/18/14  3:40 AM  Result Value Ref Range   Sodium 141 135 - 145 mmol/L   Potassium 3.1 (L) 3.5 - 5.1 mmol/L   Chloride 102 96 - 112 mmol/L   CO2 33 (H) 19 - 32 mmol/L   Glucose, Bld 102 (H) 70 - 99 mg/dL   BUN 14 6 - 23 mg/dL   Creatinine, Ser 0.69 0.50 - 1.10 mg/dL   Calcium 8.4 8.4 - 10.5 mg/dL   GFR calc non Af Amer 78 (L) >90 mL/min   GFR calc Af Amer >90 >90 mL/min    Comment: (NOTE) The eGFR has been calculated using the CKD EPI equation. This calculation has not been validated in all clinical situations. eGFR's persistently <90 mL/min signify possible Chronic Kidney Disease.    Anion gap 6 5 - 15  CBC     Status: Abnormal   Collection Time: 05/18/14  3:40 AM  Result Value Ref Range   WBC 7.5 4.0 - 10.5 K/uL   RBC 3.26 (L) 3.87 - 5.11 MIL/uL   Hemoglobin 10.2 (L) 12.0 - 15.0 g/dL   HCT 31.0 (L) 36.0 - 46.0 %   MCV 95.1 78.0 - 100.0 fL   MCH 31.3 26.0 - 34.0 pg   MCHC 32.9 30.0 - 36.0 g/dL   RDW 13.1 11.5 - 15.5 %   Platelets 139 (L) 150 - 400 K/uL  Magnesium     Status: Abnormal   Collection Time: 05/18/14  3:40 AM  Result Value Ref Range   Magnesium 1.4 (L) 1.5 - 2.5 mg/dL    Imaging / Studies: No results found.  Medications / Allergies: per chart  Antibiotics: Anti-infectives    Start     Dose/Rate Route Frequency Ordered Stop   05/17/14 1224  clindamycin (CLEOCIN) 900 mg, gentamicin (GARAMYCIN) 240 mg in sodium chloride 0.9 % 1,000 mL for intraperitoneal lavage  Status:  Discontinued       As needed 05/17/14 1224 05/17/14 1224   05/17/14 0630  clindamycin (CLEOCIN) 900 mg, gentamicin (GARAMYCIN) 240 mg in sodium chloride 0.9 % 1,000 mL for intraperitoneal lavage  Status:  Discontinued    Comments:  Pharmacy may adjust dosing strength, schedule, rate of infusion, etc as needed to optimize therapy    Intraperitoneal To Surgery 05/17/14 0559 05/17/14 1441   05/17/14  0559  clindamycin (CLEOCIN) IVPB 900 mg     900 mg 100 mL/hr over 30 Minutes Intravenous 60 min pre-op 05/17/14 0559 05/17/14 0742   05/17/14 0559  gentamicin (GARAMYCIN) 260 mg in dextrose 5 % 100 mL IVPB     5 mg/kg  51.3 kg 106.5 mL/hr over 60 Minutes Intravenous 60 min pre-op 05/17/14 0559 05/17/14 0757       Note: Portions of this report may have been transcribed using voice recognition software. Every effort was made to ensure accuracy; however, inadvertent computerized transcription errors may be present.   Any transcriptional errors that result from this process are unintentional.  Adin Hector, M.D., F.A.C.S. Gastrointestinal and Minimally Invasive Surgery [facility] [address] [place], [place] [address] [phone] Main / Paging   05/18/2014

## 2014-05-18 NOTE — Consult Note (Signed)
WOC ostomy follow up Stoma type/location:  LLQ, end colostomy Stomal assessment/size: aprox 2cm, budded, pink and moist Peristomal assessment: pouch intact from OR yesterday Treatment options for stomal/peristomal skin: NA Output small amount brown loose mucous and stool  Ostomy pouching: 1pc in place, intact.  2pc supplies in the room  Education provided: met with patient and her nephew, he lives in the home with the patient.  She does not have her glasses and so I only left educational material with her and her nephew will bring her glasses in this afternoon.  She is very sleepy and her nephew also sleeping in the room. She reports she will be learning to provide self care but will need her glasses to see.  Will plan for [facility] nurse to follow up with her on Monday for teaching session.  Enrolled patient in [place] Start Discharge program: No  WOC team to follow along with you  [place] [phone]

## 2014-05-19 LAB — CREATININE, SERUM
CREATININE: 0.44 mg/dL — AB (ref 0.50–1.10)
GFR calc Af Amer: >90 mL/min (ref >90)
GFR calc non Af Amer: >90 mL/min (ref >90)

## 2014-05-19 LAB — MAGNESIUM: MAGNESIUM: 1.6 mg/dL (ref 1.5–2.5)

## 2014-05-19 LAB — HEMOGLOBIN: HEMOGLOBIN: 10.3 g/dL — AB (ref 12.0–15.0)

## 2014-05-19 LAB — POTASSIUM: Potassium: 4.3 mmol/L (ref 3.5–5.1)

## 2014-05-19 NOTE — Clinical Social Work Psychosocial (Signed)
Clinical Social Work Department BRIEF PSYCHOSOCIAL ASSESSMENT 05/19/2014  Patient:  Bethany [name], Bethany [name]     Account Number:  [id number]     Admit date:  05/17/2014  Clinical Social Worker:  Dede Query, CLINICAL SOCIAL WORKER  Date/Time:  05/19/2014 04:26 PM  Referred by:  Physician  Date Referred:  05/19/2014 Referred for  SNF Placement   Other Referral:   Interview type:  Patient Other interview type:   And pt's nepew Bethany [name]    PSYCHOSOCIAL DATA Living Status:  FAMILY Admitted from facility:   Level of care:   Primary support name:  Bethany [name] Primary support relationship to patient:  FAMILY Degree of support available:   [place]/ pt lives with her nephew and he is emotionally and physically available for pt.  Pt's nephew took the month of april off to care for pt while she is dealing with her health problems    CURRENT CONCERNS  Other Concerns:    SOCIAL WORK ASSESSMENT / PLAN CSW met with pt and her nephew at bedside.  CSW introduced herself and provided a description of CSW role.  CSW encouraged pt and her family to discuss her history and current needs.  CSW provided an explanation of SNF/rehab process and prompted pt and family to explore thoughts and feelings related to rehab and diagnoses.  CSW provided active listening.  CSW provided list of possible SNF's for pt rehab needs.   Assessment/plan status:  Psychosocial Support/Ongoing Assessment of Needs Other assessment/ plan:   Send pt information to SNF's in [place] area   Information/referral to community resources:    PATIENT'S/FAMILY'S RESPONSE TO PLAN OF CARE: Pt appeared sleepy and her nephew was by her bedside.  Pt and nephew talked about pt history stating that pt has been living with nephew and he helps care for her.  Pt was doing well before recent hospitalization and nephew stated that pt was using a walker to get around, would prepare small meals and could bath herself.  Pt has done rehab in past and would like to  go back to [facility] at discharge.  Pt and nephew are hopeful that pt will regain her strength and be back at home soon.   Dede Query, LCSW [place] Worker - Weekend Coverage cell #: [phone]

## 2014-05-19 NOTE — Clinical Social Work Placement (Signed)
Clinical Social Work Department CLINICAL SOCIAL WORK PLACEMENT NOTE 05/19/2014  Patient:  Bethany [name], Bethany [name]  Account Number:  [id number] Admit date:  05/17/2014  Clinical Social Worker:  Dede Query, CLINICAL SOCIAL WORKER  Date/time:  05/19/2014 04:34 PM  Clinical Social Work is seeking post-discharge placement for this patient at the following level of care:   SKILLED NURSING   (*CSW will update this form in Epic as items are completed)   05/19/2014  Patient/family provided with [place] Department of Clinical Social Work's list of facilities offering this level of care within the geographic area requested by the patient (or if unable, by the patient's family).  05/19/2014  Patient/family informed of their freedom to choose among providers that offer the needed level of care, that participate in Medicare, Medicaid or managed care program needed by the patient, have an available bed and are willing to accept the patient.  05/19/2014  Patient/family informed of [facility]' ownership interest in [facility], as well as of the fact that they are under no obligation to receive care at this facility.  PASARR submitted to EDS on 05/19/2014 PASARR number received on 05/19/2014  FL2 transmitted to all facilities in geographic area requested by pt/family on  05/19/2014 FL2 transmitted to all facilities within larger geographic area on   Patient informed that his/her managed care company has contracts with or will negotiate with  certain facilities, including the following:     Patient/family informed of bed offers received:   Patient chooses bed at  Physician recommends and patient chooses bed at    Patient to be transferred to  on   Patient to be transferred to facility by  Patient and family notified of transfer on  Name of family member notified:    The following physician request were entered in Epic:   Additional Comments:  .Dede Query, [place] Social Worker - Weekend Coverage cell #: [phone]

## 2014-05-19 NOTE — Progress Notes (Signed)
Patient ID: Bethany [name], female   DOB: March 21, 1928, 81 y.o.   MRN: [id number]  [facility]  POD#: 2  Subjective: Patient in bed.  Mild nausea yesterday.  Taking limited full liquids.  Has not ambulated.  Objective: Vital signs in last 24 hours: Temp:  [97.4 F (36.3 C)-99.5 F (37.5 C)] 98.1 F (36.7 C) (04/09 0600) Pulse Rate:  [62-86] 85 (04/09 0600) Resp:  [14-18] 16 (04/09 0600) BP: (113-145)/(45-68) 143/68 mmHg (04/09 0600) SpO2:  [92 %-99 %] 92 % (04/09 0600) Last BM Date:  (colostomy)  Intake/Output from previous day: 04/08 0701 - 04/09 0700 In: 898.8 [I.V.:848.8; IV Piggyback:50] Out: 1955 [Urine:1425; Drains:255; Stool:275] Intake/Output this shift:    Physical Exam: HEENT - sclerae clear, mucous membranes moist Neck - soft Chest - clear bilaterally Cor - RRR Abdomen - soft, mild distension; BS present; small liquid stool in ostomy bag; JP with moderate serosanguinous fluid Ext - no edema, non-tender Neuro - alert & oriented, no focal deficits  Lab Results:   Recent Labs  05/18/14 0340 05/19/14 0559  WBC 7.5  --   HGB 10.2* 10.3*  HCT 31.0*  --   PLT 139*  --    BMET  Recent Labs  05/18/14 0340 05/19/14 0559  NA 141  --   K 3.1* 4.3  CL 102  --   CO2 33*  --   GLUCOSE 102*  --   BUN 14  --   CREATININE 0.69 0.44*  CALCIUM 8.4  --    PT/INR No results for input(s): LABPROT, INR in the last 72 hours. Comprehensive Metabolic Panel:    Component Value Date/Time   NA 141 05/18/2014 0340   NA 140 05/09/2014 1435   NA 140 11/23/2013 1426   NA 142 11/06/2013 1410   K 4.3 05/19/2014 0559   K 3.1* 05/18/2014 0340   K 4.0 11/23/2013 1426   K 3.4* 11/06/2013 1410   CL 102 05/18/2014 0340   CL 100 05/09/2014 1435   CO2 33* 05/18/2014 0340   CO2 32 05/09/2014 1435   CO2 28 11/23/2013 1426   CO2 31* 11/06/2013 1410   BUN 14 05/18/2014 0340   BUN 14 05/09/2014 1435   BUN 16.5 11/23/2013 1426   BUN 17.2  11/06/2013 1410   CREATININE 0.44* 05/19/2014 0559   CREATININE 0.69 05/18/2014 0340   CREATININE 0.7 11/23/2013 1426   CREATININE 0.8 11/06/2013 1410   GLUCOSE 102* 05/18/2014 0340   GLUCOSE 93 05/09/2014 1435   GLUCOSE 103 11/23/2013 1426   GLUCOSE 125 11/06/2013 1410   CALCIUM 8.4 05/18/2014 0340   CALCIUM 9.1 05/09/2014 1435   CALCIUM 9.6 11/23/2013 1426   CALCIUM 9.5 11/06/2013 1410   AST 15 01/23/2014 0511   AST 31 11/28/2013 2239   AST 32 11/23/2013 1426   AST 12 11/06/2013 1410   ALT 10 01/23/2014 0511   ALT 36* 11/28/2013 2239   ALT 52 11/23/2013 1426   ALT 8 11/06/2013 1410   ALKPHOS 62 01/23/2014 0511   ALKPHOS 100 11/28/2013 2239   ALKPHOS 110 11/23/2013 1426   ALKPHOS 69 11/06/2013 1410   BILITOT 0.7 01/23/2014 0511   BILITOT 1.4* 11/28/2013 2239   BILITOT 1.01 11/23/2013 1426   BILITOT 0.53 11/06/2013 1410   PROT 5.3* 01/23/2014 0511   PROT 4.5* 11/28/2013 2239   PROT 5.6* 11/23/2013 1426   PROT 6.0* 11/06/2013 1410   ALBUMIN 2.9* 01/23/2014 0511   ALBUMIN 2.3*  11/28/2013 2239   ALBUMIN 3.2* 11/23/2013 1426   ALBUMIN 3.3* 11/06/2013 1410    Studies/Results: No results found.  Anti-infectives: Anti-infectives    Start     Dose/Rate Route Frequency Ordered Stop   05/17/14 1224  clindamycin (CLEOCIN) 900 mg, gentamicin (GARAMYCIN) 240 mg in sodium chloride 0.9 % 1,000 mL for intraperitoneal lavage  Status:  Discontinued       As needed 05/17/14 1224 05/17/14 1224   05/17/14 0630  clindamycin (CLEOCIN) 900 mg, gentamicin (GARAMYCIN) 240 mg in sodium chloride 0.9 % 1,000 mL for intraperitoneal lavage  Status:  Discontinued    Comments:  Pharmacy may adjust dosing strength, schedule, rate of infusion, etc as needed to optimize therapy    Intraperitoneal To Surgery 05/17/14 0559 05/17/14 1441   05/17/14 0559  clindamycin (CLEOCIN) IVPB 900 mg     900 mg 100 mL/hr over 30 Minutes Intravenous 60 min pre-op 05/17/14 0559 05/17/14 0742   05/17/14 0559   gentamicin (GARAMYCIN) 260 mg in dextrose 5 % 100 mL IVPB     5 mg/kg  51.3 kg 106.5 mL/hr over 60 Minutes Intravenous 60 min pre-op 05/17/14 0559 05/17/14 0757      Assessment & Plans: Status post low anterior resection with end colostomy for rectal ca  Continue full liquids  Encouraged OOB, ambulation  Will resume Xarelto when taking diet  Earnstine Regal, MD, [facility] Office: [place] 05/19/2014

## 2014-05-20 LAB — POTASSIUM: POTASSIUM: 4.3 mmol/L (ref 3.5–5.1)

## 2014-05-20 LAB — MAGNESIUM: Magnesium: 1.5 mg/dL (ref 1.5–2.5)

## 2014-05-20 LAB — CREATININE, SERUM
CREATININE: 0.34 mg/dL — AB (ref 0.50–1.10)
GFR calc Af Amer: >90 mL/min (ref >90)

## 2014-05-20 LAB — HEMOGLOBIN: HEMOGLOBIN: 10.4 g/dL — AB (ref 12.0–15.0)

## 2014-05-20 MED ORDER — RIVAROXABAN 20 MG PO TABS
20.0000 mg | ORAL_TABLET | Freq: Every day | ORAL | Status: DC
  Administered 2014-05-20 – 2014-05-22 (×3): 20 mg via ORAL
  Filled 2014-05-20 (×5): qty 1

## 2014-05-20 MED ORDER — PRO-STAT SUGAR FREE PO LIQD
30.0000 mL | Freq: Two times a day (BID) | ORAL | Status: DC
  Administered 2014-05-20 – 2014-05-23 (×7): 30 mL via ORAL
  Filled 2014-05-20 (×8): qty 30

## 2014-05-20 NOTE — Progress Notes (Signed)
[place] for Xarelto Indication: History of PE  Allergies  Allergen Reactions  . Cataflam [Diclofenac] Other (See Comments)    Unknown; patient is unaware of allergy.  . Cephalosporins Shortness Of Breath  . Erythromycin Shortness Of Breath  . Keflex [Cephalexin] Shortness Of Breath  . Propoxyphene     Unknown rxn  . Codeine Nausea And Vomiting  . Naproxen Other (See Comments)    Unknown; patient is unaware of allergy  . Penicillins Other (See Comments)    Patient states that she CAN take this; unaware of allergy  . Synalgos-Dc [Aspirin-Caff-Dihydrocodeine] Other (See Comments)    Unknown; patient is unaware of allergy  . Ultram [Tramadol] Other (See Comments)    Unknown; patient is unaware of allergy    Patient Measurements: Height: 5\' 6"  (167.6 cm) Weight: 115 lb 9.6 oz (52.436 kg) IBW/kg (Calculated) : 59.3  Vital Signs: Temp: 98.3 F (36.8 C) (04/10 0538) Temp Source: Oral (04/10 0538) BP: 156/61 mmHg (04/10 0538) Pulse Rate: 72 (04/10 0538)  Labs:  Recent Labs  05/18/14 0340 05/19/14 0559 05/20/14 0512  HGB 10.2* 10.3* 10.4*  HCT 31.0*  --   --   PLT 139*  --   --   CREATININE 0.69 0.44* 0.34*    Estimated Creatinine Clearance: 43.3 mL/min (by C-G formula based on Cr of 0.34).   Medical History: Past Medical History  Diagnosis Date  . Migraine   . GERD (gastroesophageal reflux disease)   . Hypercholesteremia   . Pulmonary embolism 01/01/2012  / 11/28/2013  . Hypothyroidism   . Rectal mass   . Ovarian tumor     sees dr Lisbeth Renshaw ob-gyn yearly for evaluation  . Cancer 09/29/13    rectal adenocarcinoma  . Rectal cancer 09/29/13    invasive adeocarcinoma  . Hx of radiation therapy 10/25/13-11/28/13    pelvis, 4500 cGy in 25 sessions  . Incontinence of urine   . Hypertension     stopped med during chemo /radiation - BP has been stable  . Numbness     "I have no feeling in my left leg" unknown cause  . Arthritis    . History of skin cancer     removed from forehead  . Rosacea   . Rectal cancer   . Frequent bowel movements   . Hx: UTI (urinary tract infection)   . Depression   . Anxiety   . Severe sepsis with septic shock 11/28/2013  . UTI (lower urinary tract infection) 01/23/2014  . Acute encephalopathy 12/08/2013  . Clostridium difficile colitis 12/08/2013  . Cor pulmonale, acute 01/02/2012  . Fall from standing 01/22/2014  . Hypokalemia 11/28/2013  . Dysphonia, spasmodic 05/18/2014    Medications:  Scheduled:  . acetaminophen  1,000 mg Oral TID  . cholecalciferol  2,000 Units Oral Daily  . feeding supplement (RESOURCE BREEZE)  1 Container Oral BID BM  . gabapentin  100 mg Oral TID  . heparin subcutaneous  5,000 Units Subcutaneous 3 times per day  . levothyroxine  25 mcg Oral Once per day on Sun Mon Wed Fri  . levothyroxine  75 mcg Oral Once per day on Tue Thu Sat  . lip balm  1 application Topical BID  . saccharomyces boulardii  250 mg Oral BID  . sertraline  25 mg Oral Daily   Infusions:  . 0.9 % NaCl with KCl 40 mEq / L 75 mL/hr (05/19/14 2130)   PRN: alum & mag hydroxide-simeth, diphenhydrAMINE **OR**  diphenhydrAMINE, HYDROmorphone (DILAUDID) injection, magic mouthwash, menthol-cetylpyridinium, metoprolol, metoprolol tartrate, ondansetron **OR** ondansetron (ZOFRAN) IV, phenol, promethazine, zolpidem  Assessment: 65 yoF with Rectal Ca and R ovarian mass, palliative surgery 4/7: rectosigmoid resection, end descending colostomy, removal R ovary. Hx of PE, chronic anti-coag with Xarelto 20mg  daily, last dose taken 4/4.  Per [facility], ok to resume xarelto today, 4/10. Received heparin 5000 units sq at 6AM today, therefore, will wait until 2PM today to start xarelto.  Goal of Therapy:  Therapeutic anticoagulation Dose appropriate for renal function Monitor platelets by anticoagulation protocol: Yes   Plan:   Discontinue sq heparin  Resume xarelto 20mg  PO daily at 2pm today, then  daily with supper starting 4/11  Peggyann Juba, PharmD, BCPS Pager: [phone] 05/20/2014,8:49 AM

## 2014-05-20 NOTE — Progress Notes (Signed)
Patient ID: Bethany [name], female   DOB: 1928/12/01, 81 y.o.   MRN: [id number]  [facility]  POD#: 3  Subjective: Patient comfortable in bed, no nausea.  Incontinent of urine.  Tolerating full liquids.  Objective: Vital signs in last 24 hours: Temp:  [98 F (36.7 C)-98.4 F (36.9 C)] 98.3 F (36.8 C) (04/10 0538) Pulse Rate:  [65-75] 72 (04/10 0538) Resp:  [16-20] 20 (04/10 0538) BP: (115-156)/(51-69) 156/61 mmHg (04/10 0538) SpO2:  [93 %-99 %] 97 % (04/10 0538) Weight:  [52.436 kg (115 lb 9.6 oz)] 52.436 kg (115 lb 9.6 oz) (04/10 0538) Last BM Date: 05/20/14  Intake/Output from previous day: 04/09 0701 - 04/10 0700 In: 2700 [I.V.:2700] Out: 95 [Drains:70; Stool:25] Intake/Output this shift:    Physical Exam: HEENT - sclerae clear, mucous membranes moist Neck - soft Chest - clear bilaterally Cor - RRR Abdomen - soft without distension; BS present; stoma viable, edematous, small liquid stool in bag; JP with serosanguinous Ext - no edema, non-tender Neuro - alert & oriented, no focal deficits  Lab Results:   Recent Labs  05/18/14 0340 05/19/14 0559 05/20/14 0512  WBC 7.5  --   --   HGB 10.2* 10.3* 10.4*  HCT 31.0*  --   --   PLT 139*  --   --    BMET  Recent Labs  05/18/14 0340 05/19/14 0559 05/20/14 0512  NA 141  --   --   K 3.1* 4.3 4.3  CL 102  --   --   CO2 33*  --   --   GLUCOSE 102*  --   --   BUN 14  --   --   CREATININE 0.69 0.44* 0.34*  CALCIUM 8.4  --   --    PT/INR No results for input(s): LABPROT, INR in the last 72 hours. Comprehensive Metabolic Panel:    Component Value Date/Time   NA 141 05/18/2014 0340   NA 140 05/09/2014 1435   NA 140 11/23/2013 1426   NA 142 11/06/2013 1410   K 4.3 05/20/2014 0512   K 4.3 05/19/2014 0559   K 4.0 11/23/2013 1426   K 3.4* 11/06/2013 1410   CL 102 05/18/2014 0340   CL 100 05/09/2014 1435   CO2 33* 05/18/2014 0340   CO2 32 05/09/2014 1435   CO2 28  11/23/2013 1426   CO2 31* 11/06/2013 1410   BUN 14 05/18/2014 0340   BUN 14 05/09/2014 1435   BUN 16.5 11/23/2013 1426   BUN 17.2 11/06/2013 1410   CREATININE 0.34* 05/20/2014 0512   CREATININE 0.44* 05/19/2014 0559   CREATININE 0.7 11/23/2013 1426   CREATININE 0.8 11/06/2013 1410   GLUCOSE 102* 05/18/2014 0340   GLUCOSE 93 05/09/2014 1435   GLUCOSE 103 11/23/2013 1426   GLUCOSE 125 11/06/2013 1410   CALCIUM 8.4 05/18/2014 0340   CALCIUM 9.1 05/09/2014 1435   CALCIUM 9.6 11/23/2013 1426   CALCIUM 9.5 11/06/2013 1410   AST 15 01/23/2014 0511   AST 31 11/28/2013 2239   AST 32 11/23/2013 1426   AST 12 11/06/2013 1410   ALT 10 01/23/2014 0511   ALT 36* 11/28/2013 2239   ALT 52 11/23/2013 1426   ALT 8 11/06/2013 1410   ALKPHOS 62 01/23/2014 0511   ALKPHOS 100 11/28/2013 2239   ALKPHOS 110 11/23/2013 1426   ALKPHOS 69 11/06/2013 1410   BILITOT 0.7 01/23/2014 0511   BILITOT 1.4* 11/28/2013 2239  BILITOT 1.01 11/23/2013 1426   BILITOT 0.53 11/06/2013 1410   PROT 5.3* 01/23/2014 0511   PROT 4.5* 11/28/2013 2239   PROT 5.6* 11/23/2013 1426   PROT 6.0* 11/06/2013 1410   ALBUMIN 2.9* 01/23/2014 0511   ALBUMIN 2.3* 11/28/2013 2239   ALBUMIN 3.2* 11/23/2013 1426   ALBUMIN 3.3* 11/06/2013 1410    Studies/Results: No results found.  Anti-infectives: Anti-infectives    Start     Dose/Rate Route Frequency Ordered Stop   05/17/14 1224  clindamycin (CLEOCIN) 900 mg, gentamicin (GARAMYCIN) 240 mg in sodium chloride 0.9 % 1,000 mL for intraperitoneal lavage  Status:  Discontinued       As needed 05/17/14 1224 05/17/14 1224   05/17/14 0630  clindamycin (CLEOCIN) 900 mg, gentamicin (GARAMYCIN) 240 mg in sodium chloride 0.9 % 1,000 mL for intraperitoneal lavage  Status:  Discontinued    Comments:  Pharmacy may adjust dosing strength, schedule, rate of infusion, etc as needed to optimize therapy    Intraperitoneal To Surgery 05/17/14 0559 05/17/14 1441   05/17/14 0559  clindamycin  (CLEOCIN) IVPB 900 mg     900 mg 100 mL/hr over 30 Minutes Intravenous 60 min pre-op 05/17/14 0559 05/17/14 0742   05/17/14 0559  gentamicin (GARAMYCIN) 260 mg in dextrose 5 % 100 mL IVPB     5 mg/kg  51.3 kg 106.5 mL/hr over 60 Minutes Intravenous 60 min pre-op 05/17/14 0559 05/17/14 0757      Assessment & Plans: Status post low anterior resection with end colostomy for rectal ca Advance to regular diet today Encouraged OOB, ambulation Resume Xarelto today per Dr. Valentina Gu, MD, [facility] Office: [place] 05/20/2014

## 2014-05-20 NOTE — Progress Notes (Signed)
OT Cancellation Note  Patient Details Name: Bethany [name] MRN: [id number] DOB: 1928/12/28   Cancelled Treatment:    Reason Eval/Treat Not Completed: Pt with another provider. Will reattempt.   Darlina Rumpf [place]inn[place], OTR/L [phone]  05/20/2014, 9:53 AM

## 2014-05-20 NOTE — Progress Notes (Signed)
INITIAL NUTRITION ASSESSMENT  DOCUMENTATION CODES Per approved criteria  -Severe malnutrition in the context of chronic illness  Pt meets criteria for severe MALNUTRITION in the context of chronic illness as evidenced by 14% weight loss x 6 months and severe muscle and fat depletion.  INTERVENTION: Company secretary po BID, each supplement provides 250 kcal and 9 grams of protein Provide Prostat liquid protein PO 30 ml BID with meals, each supplement provides 100 kcal, 15 grams protein. Encourage PO intake RD to continue to monitor  NUTRITION DIAGNOSIS: Malnutrition related to chronic illness as evidenced by 14% weight loss x 6 months and severe muscle and fat depletion .   Goal: Pt to meet >/= 90% of their estimated nutrition needs   Monitor:  PO and supplemental intake, weight, labs, I/O's  Reason for Assessment: Pt identified as at nutrition risk on the Malnutrition Screen Tool  Admitting Dx: Rectal cancer  ASSESSMENT: 81 year old female who presents with colorectal cancer. Patient comes in today anxious and a little down hearted. Patient diagnosed with rectal cancer back in August.. Underwent neoadjuvant chemoradiation therapy.  Pt reports good appetite PTA. Pt was drinking Boost supplements at home but states that they caused her to have loose stools. Pt diet was advanced to regular. Pt has had 19 lb weight loss since October (14% weight loss x 6 months, significant for time frame).  Pt would like to try a protein supplement in addition to the Resource Breeze supplements. RD to order.  Nutrition Focused Physical Exam:  Subcutaneous Fat:  Orbital Region: WNL Upper Arm Region: severe depletion Thoracic and Lumbar Region: NA  Muscle:  Temple Region: moderate depletion Clavicle Bone Region: severe depletion Clavicle and Acromion Bone Region: severe depletion Scapular Bone Region: moderate depletion Dorsal Hand: severe depletion Patellar Region: moderate  depletion Anterior Thigh Region: moderate depletion Posterior Calf Region: moderate depletion  Edema: no LE edema  Labs reviewed: Low Creatinine  Height: Ht Readings from Last 1 Encounters:  05/17/14 5\' 6"  (1.676 m)    Weight: Wt Readings from Last 1 Encounters:  05/20/14 115 lb 9.6 oz (52.436 kg)    Ideal Body Weight: 130 lb  % Ideal Body Weight: 88%  Wt Readings from Last 10 Encounters:  05/20/14 115 lb 9.6 oz (52.436 kg)  04/10/14 119 lb 3.2 oz (54.069 kg)  03/15/14 119 lb (53.978 kg)  03/02/14 113 lb 3.2 oz (51.347 kg)  01/22/14 116 lb (52.617 kg)  11/30/13 122 lb 9.2 oz (55.6 kg)  11/23/13 129 lb 14.4 oz (58.922 kg)  11/13/13 133 lb 14.4 oz (60.737 kg)  11/09/13 134 lb 4.8 oz (60.918 kg)  11/08/13 134 lb (60.782 kg)    Usual Body Weight: 140 lb  % Usual Body Weight: 82%  BMI:  Body mass index is 18.67 kg/(m^2).  Estimated Nutritional Needs: Kcal: 1600-1800 Protein: 75-85g Fluid: 1.6L/day  Skin: abdominal incisions  Diet Order: Diet - low sodium heart healthy Diet regular Room service appropriate?: Yes; Fluid consistency:: Thin  EDUCATION NEEDS: -No education needs identified at this time   Intake/Output Summary (Last 24 hours) at 05/20/14 1010 Last data filed at 05/20/14 0600  Gross per 24 hour  Intake   1500 ml  Output     75 ml  Net   1425 ml    Last BM: 4/10  Labs:   Recent Labs Lab 05/18/14 0340 05/19/14 0559 05/20/14 0512  NA 141  --   --   K 3.1* 4.3 4.3  CL  102  --   --   CO2 33*  --   --   BUN 14  --   --   CREATININE 0.69 0.44* 0.34*  CALCIUM 8.4  --   --   MG 1.4* 1.6 1.5  GLUCOSE 102*  --   --     CBG (last 3)  No results for input(s): GLUCAP in the last 72 hours.  Scheduled Meds: . acetaminophen  1,000 mg Oral TID  . cholecalciferol  2,000 Units Oral Daily  . feeding supplement (RESOURCE BREEZE)  1 Container Oral BID BM  . gabapentin  100 mg Oral TID  . levothyroxine  25 mcg Oral Once per day on Sun Mon Wed  Fri  . levothyroxine  75 mcg Oral Once per day on Tue Thu Sat  . lip balm  1 application Topical BID  . rivaroxaban  20 mg Oral Q supper  . saccharomyces boulardii  250 mg Oral BID  . sertraline  25 mg Oral Daily    Continuous Infusions: . 0.9 % NaCl with KCl 40 mEq / L 50 mL/hr (05/20/14 0915)    Past Medical History  Diagnosis Date  . Migraine   . GERD (gastroesophageal reflux disease)   . Hypercholesteremia   . Pulmonary embolism 01/01/2012  / 11/28/2013  . Hypothyroidism   . Rectal mass   . Ovarian tumor     sees dr Lisbeth Renshaw ob-gyn yearly for evaluation  . Cancer 09/29/13    rectal adenocarcinoma  . Rectal cancer 09/29/13    invasive adeocarcinoma  . Hx of radiation therapy 10/25/13-11/28/13    pelvis, 4500 cGy in 25 sessions  . Incontinence of urine   . Hypertension     stopped med during chemo /radiation - BP has been stable  . Numbness     "I have no feeling in my left leg" unknown cause  . Arthritis   . History of skin cancer     removed from forehead  . Rosacea   . Rectal cancer   . Frequent bowel movements   . Hx: UTI (urinary tract infection)   . Depression   . Anxiety   . Severe sepsis with septic shock 11/28/2013  . UTI (lower urinary tract infection) 01/23/2014  . Acute encephalopathy 12/08/2013  . Clostridium difficile colitis 12/08/2013  . Cor pulmonale, acute 01/02/2012  . Fall from standing 01/22/2014  . Hypokalemia 11/28/2013  . Dysphonia, spasmodic 05/18/2014    Past Surgical History  Procedure Laterality Date  . Ovarian cyst surgery  yrs ago  . Cataract extraction Bilateral   . Colonoscopy N/A 09/22/2013    Procedure: COLONOSCOPY;  Surgeon: Beryle Beams, MD;  Location: [facility];  Service: Endoscopy;  Laterality: N/A;  . Eus N/A 09/29/2013    Procedure: LOWER ENDOSCOPIC ULTRASOUND (EUS);  Surgeon: Beryle Beams, MD;  Location: [facility];  Service: Endoscopy;  Laterality: N/A;  . Abdominal hysterectomy      years ago, prolapsed uterus     Clayton Bibles, MS, RD, LDN Pager: [phone] After Hours Pager: [phone]

## 2014-05-20 NOTE — Progress Notes (Signed)
Up to chair with moderate assist from nurse. Tolerated well.

## 2014-05-20 NOTE — Progress Notes (Signed)
Recently removed dressings from rt abd and changed drain sponge at JP site. Area cleansed with NS damp 4x4's. Pt received good bath this and did not want to get into the shower today. Awaiting PT to work with pt. Pt plans to get OOB to chair after lunch.

## 2014-05-21 LAB — HEMOGLOBIN: Hemoglobin: 10.5 g/dL — ABNORMAL LOW (ref 12.0–15.0)

## 2014-05-21 LAB — MAGNESIUM: Magnesium: 1.4 mg/dL — ABNORMAL LOW (ref 1.5–2.5)

## 2014-05-21 LAB — CREATININE, SERUM
Creatinine, Ser: 0.34 mg/dL — ABNORMAL LOW (ref 0.50–1.10)
GFR calc Af Amer: >90 mL/min (ref >90)
GFR calc non Af Amer: >90 mL/min (ref >90)

## 2014-05-21 LAB — POTASSIUM: Potassium: 4.1 mmol/L (ref 3.5–5.1)

## 2014-05-21 MED ORDER — ACETAMINOPHEN 325 MG PO TABS
650.0000 mg | ORAL_TABLET | Freq: Three times a day (TID) | ORAL | Status: DC
  Administered 2014-05-21 – 2014-05-23 (×7): 650 mg via ORAL
  Filled 2014-05-21 (×7): qty 2

## 2014-05-21 MED ORDER — LACTATED RINGERS IV BOLUS (SEPSIS): 1000.0000 mL | Freq: Three times a day (TID) | INTRAVENOUS | Status: AC | PRN

## 2014-05-21 MED ORDER — HYDROCODONE-ACETAMINOPHEN 5-325 MG PO TABS: 1.0000 | ORAL_TABLET | ORAL | Status: DC | PRN

## 2014-05-21 MED ORDER — MAGNESIUM SULFATE 4 GM/100ML IV SOLN
4.0000 g | Freq: Once | INTRAVENOUS | Status: AC
  Administered 2014-05-21: 4 g via INTRAVENOUS
  Filled 2014-05-21: qty 100

## 2014-05-21 MED ORDER — MAGNESIUM CHLORIDE 64 MG PO TBEC
1.0000 | DELAYED_RELEASE_TABLET | Freq: Two times a day (BID) | ORAL | Status: DC
  Administered 2014-05-21 – 2014-05-23 (×5): 64 mg via ORAL
  Filled 2014-05-21 (×6): qty 1

## 2014-05-21 NOTE — Consult Note (Signed)
  WOC ostomy consult note Stoma type/location: LLQ End colostomy Stomal assessment/size: 1 3/4" pink moist and well budded.  Round.  Os at center Peristomal assessment: Intact Treatment options for stomal/peristomal skin: None at this time. Output Loose, green stool Ostomy pouching:2pc. 2 1/4" pouch system.  Education provided:  Son present in room and wishes to learn ostomy care as well.  Patient states she is hoping to be discharged to rehab, but is unsure at this time. Pouch is removed and discussed cleaning stoma and peristomal skin with mild soap and water.  Stoma measures 1 3/4" today.  Son cuts out the barrier and it  is applied to skin.  Pouch is applied and son assists with roll closure.  Discussed to empty pouch when 1/3 full and cleaning pouch prior to rolling.  Discussed showering (once MD clears) and changing pouch after showering to avoid skin irritation. Son and patient have no further questions and patient states she wants to complete the next change independently.  Encouraged to do so and I will be back to assist with that.  [facility] team will continue to remain available to patient, medical and nursing teams.  Domenic Moras RN BSN [place] Pager [phone]

## 2014-05-21 NOTE — Progress Notes (Signed)
CSW met with pt / nephew today to assist with d/c planning. Pt is interested in [employer] [facility] [employer]. SNF has been contacted and a decision is pending. CSW will update pt / nephew once a decision has been made.  Werner Lean LCSW [phone]

## 2014-05-21 NOTE — Progress Notes (Signed)
Physical Therapy Treatment Patient Details Name: Bethany [name] MRN: [id number] DOB: November 16, 1928 Today's Date: 05/21/2014    History of Present Illness The patient is a 81 year old female who presents with colorectal cancer. . Underwent neoadjuvant chemoradiation therapy. recently in rehab. Persistent C. difficile.  UTI.  Feels some numbness in her left anterior thigh/has had neurology consult per nephew.. Admitted to procede with colostomy,appendectomy, and colectomy on 05/17/14    PT Comments    Patient improving with mobility and less pain. Nephew present, continue to recommend SNF to return to modified independence and manage other aspects of self care(colostomy, balance). Patient  Wants home, nephew states"whatever the doctor thinks".  Follow Up Recommendations  SNF;Supervision/Assistance - 24 hour (patien requires assist for ambulation, BR duties, colostomy care, )     Equipment Recommendations  None recommended by PT    Recommendations for Other Services       Precautions / Restrictions Precautions Precaution Comments: colostomy, R drain, wear depends for incontinence Required Braces or Orthoses: Other Brace/Splint Other Brace/Splint: abdominal binder-loosely, DC if patient not  restricted to breathe Restrictions Weight Bearing Restrictions: No    Mobility  Bed Mobility                  Transfers Overall transfer level: Needs assistance Equipment used: Rolling walker (2 wheeled) Transfers: Sit to/from Stand Sit to Stand: Mod assist         General transfer comment: cues for hand placement, decreased control  of cescent, extra time to power up.  Ambulation/Gait Ambulation/Gait assistance: Min assist;+2 safety/equipment Ambulation Distance (Feet): 150 Feet (x 2) Assistive device: Rolling walker (2 wheeled) Gait Pattern/deviations: Step-through pattern;Trunk flexed;Narrow base of support;Drifts right/left Gait velocity: slow   General Gait Details: cues  for safety with RW, assist for turning corner, assist  around tight corners, in/out of  the BR around [facility],   Stairs            Wheelchair Mobility    Modified Rankin (Stroke Patients Only)       Balance Overall balance assessment: History of Falls         Standing balance support: During functional activity;No upper extremity supported Standing balance-Leahy Scale: Poor Standing balance comment: stready assist for pt. pulling up depends                    Cognition Arousal/Alertness: Awake/alert Behavior During Therapy: Anxious                        Exercises      General Comments        Pertinent Vitals/Pain Pain Score: 0-No pain    Home Living                      Prior Function            PT Goals (current goals can now be found in the care plan section) Progress towards PT goals: Progressing toward goals    Frequency  Min 3X/week    PT Plan Current plan remains appropriate    Co-evaluation             End of Session   Activity Tolerance: Patient tolerated treatment well Patient left: in chair;with call bell/phone within reach;with family/visitor present     Time: 3335-4562 PT Time Calculation (min) (ACUTE ONLY): 36 min  Charges:  $Gait Training: 8-22 mins $Self Care/Home Management: 8-22  G Codes:      Bethany [name] 05/21/2014, 10:40 AM Tresa Endo PT [phone]

## 2014-05-21 NOTE — Clinical Documentation Improvement (Signed)
  MD's, NP's, and PA's  Documentation of "unspecified atrial fibrillation" in patient with history.  If possible please provide greater specificity of diagnosis.  Thank you  Possible Clinical Conditions?  Please clarify the type of atrial fibrillation: ? Chronic ? Paroxysmal ? Permanent ? Persistent ? Other (please specify type) ? Unable to determine ? Unknown   Risk Factors: h/o Atrial fib   Treatment: lopressor IV/po  Thank You, Ree Kida ,RN Clinical Documentation Specialist:  [place] [facility] Information Management

## 2014-05-21 NOTE — Progress Notes (Signed)
[facility]  [address] [place], [place] [address] Phone: [phone] FAX: [phone]    Bethany [name] [id number] 1928-12-15  CARE TEAM:  PCP: [name], MD  Outpatient Care Team: Patient Care Team: [name], MD as PCP - General (Internal Medicine) Carol Ada, MD as Referring Physician (Gastroenterology) Arloa Koh, MD as Consulting Physician (Radiation Oncology) Michael Boston, MD as Consulting Physician (General Surgery) Wyatt Portela, MD as Consulting Physician (Oncology) Adrian Prows, MD as Consulting Physician (Cardiology)  Inpatient Treatment Team: Treatment Team: Attending Provider: Michael Boston, MD; Registered Nurse: Bailey Mech, RN; Registered Nurse: Celedonio Savage, RN; Physical Therapist: Fuller Mandril, PT  Problem List:   Principal Problem:   Rectal cancer s/p LAResectin/colostomy 05/17/2014 Active Problems:   Pulmonary embolism   Gastroesophageal reflux disease   Hypertension   Ovarian mass, right, s/p robotic excision 05/17/2014   Hypothyroidism   Protein-calorie malnutrition, moderate   Rash and nonspecific skin eruption   Atrial fibrillation with RVR   Anemia in neoplastic disease   Anxiety and depression   Lumbar radicular pain   Rectal cancer metastasized to liver   Hypokalemia   Hypomagnesemia   Dysphonia, spasmodic   4 Days Post-Op  Procedure(s): POST-OPERATIVE DIAGNOSIS:   Rectal Cancer Right ovarian mass  PROCEDURE: Procedure(s): XI ROBOTIC ASSISTED RECTOSIGMOID LOW ANTERIOR RESECTION END COLOSTOMY  XI ROBOTIC ASSISTED SALPINGOOOPHERECTOMY  SURGEON: Surgeon(s): Michael Boston, MD  ASSISTANT: Varney Baas, PA-C, [employer]    Diagnosis 1. Colon, segmental resection for tumor, rectosigmoid - INVASIVE ADENOCARCINOMA WITH EXTRACELLULAR MUCIN, INVADING THROUGH THE MUSCULARIS PROPRIA INTO PERICOLONIC FATTY TISSUE. - TWO EXTRAMURAL SATELLITE TUMOR NODULES. - TWELVE LYMPH  NODES, NEGATIVE FOR METASTATIC CARCINOMA (0/12). - RESECTION MARGINS, NEGATIVE FOR ATYPIA OR MALIGNANCY. 2. Ovary and fallopian tube, right - BENIGN OVARIAN FIBROMA WITH CALCIFICATIONS, NO ATYPIA OR MALIGNANCY. - BENIGN FALLOPIAN TUBAL TISSUE WITH NO EVIDENCE OF ATYPIA OR MALIGNANCY. Microscopic Comment 1. RECTUM Specimen: Rectosigmoid colon Procedure: Segmental resection Tumor site: Mid to distal portion of rectum Specimen integrity: Intact Macroscopic intactness of mesorectum: Complete Macroscopic tumor perforation: No Invasive tumor: Maximum size: 2.5 cm Histologic type(s): Invasive adenocarcinoma with extracellular mucin Histologic grade and differentiation: G2: moderately differentiated/low grade Type of polyp in which invasive carcinoma arose: N/A Microscopic extension of invasive tumor: Invading through the muscularis propria into pericolonic fatty tissue. Lymph-Vascular invasion: Not identified. Peri-neural invasion: Not identified Tumor deposit(s) (discontinuous extramural extension): Present, 2 Resection margins: Negative Proximal margin: 24.1 cm Distal margin: 3.2 cm Circumferential (radial) (posterior ascending, posterior descending; lateral 1 of 3 FINAL for [name], [name] [phone]) Microscopic Comment(continued) and posterior mid-rectum; and entire lower 1/3 rectum): 3.4 cm Treatment effect (neo-adjuvant therapy): Tumor regression grade 3 (poor response) Additional polyp(s): N/A Non-neoplastic findings: N/A Lymph nodes: number examined 12; number positive: 0 Pathologic Staging: ypT3, ypN1c, yMX Comment: MMR stains will be performed and an addendum will follow. MSI will be sent out and the results will be availalble in EPIC. Aldona Bar MD Pathologist, Electronic Signature (Case signed 05/18/2014) Specimen Bethany [name] and Clinical Information Specimen Comment 1. *fresh clinical research* Specimen(s) Obtained: 1. Colon, segmental resection for tumor,  rectosigmoid 2. Ovary and fallopian tube, right Specimen Clinical Information 1. Rectal cancer (je) Bethany [name] 1. Specimen: Received fresh labeled rectosigmoid Specimen integrity: Intact, with two stapled resection margins. Per the requisition, suture designates the proximal margin. At the distal end of the specimen, the serosa is indurated (4.5 cm from the distal margin). The area is  inked black. Specimen length: 30.0 cm Mesorectal intactness: Complete Tumor location: Mid to distal portion of the rectum, along the mesenteric aspect Tumor size: There is a 2.5 x 1.9 cm tan red, ulcerated lesion identified. A small amount of red brown clotted blood is present in the central aspect of the lesion. Percent of bowel circumference involved: Approximately 90% Tumor distance to margins: Proximal: 24.1 Distal: 3.2 Radial (posterior ascending, posterior descending; lateral and posterior mid-rectum; and entire lower 1/3 rectum): 3.4 cm Macroscopic extent of tumor invasion: Invades through the muscularis propria into the subserosal adipose tissue. Total presumed lymph nodes: Eleven tan gray possible lymph nodes are identified, averaging 0.2 cm in greatest dimension. A 0.7 x 0.7 x 0.5 cm tan yellow, firm, ill defined nodule is identified proximal to the main lesion, suggestive of a secondary extramural nodule. Please note the patient is status-post radiation therapy. Extramural satellite tumor nodules: In the adipose tissue surrounding the lesion, there is a 1.4 x 0.7 x 0.7 cm tan white, firm, ill defined nodule identified. Mucosal polyp(s): No additional mucosal lesions are identified. Additional findings: The uninvolved mucosa is tan pink, with slight flattening of the mucosal folds. Proximal to the lesion the mucosa displays gray blue discoloration, suggestive of tattoo powder. The wall ranges from 0.2 to 0.5 cm. 2 of 3 FINAL for Bethany [name], Bethany [name] ([id number]) Bethany [name](continued) Block summary: A =  proximal resection margin B, C = distal resection margin D-G = sections of lesion H = possible extramural nodule I = uninvolved mucosa J = second possible extramural nodule K = six possible lymph nodes L = five possible lymph nodes. (KL:kh 05-18-14) 2. The specimen is received in formalin and consists of a 4.2 cm in length x 0.5 cm in diameter fimbriated portion of fallopian tube. The serosal surface is tan pink, hyperemic, with a 0.8 cm cystic structure identified in the adjacent soft tissue. The cyst is filled with tan gelatinous material. Sectioning the fallopian tube reveals a tan cut surface with a pinpoint lumen. Also received is a 5.6 x 4.0 x 3.8 cm, 40 gram nodular portion of tan white, hyperemic soft tissue. The surface displays focal adhesions. Sectioning the specimen reveals a tan white, firm solid fibrous cut surface. The central area is focally calcified, and displays a possible piece of silver metallic material. A small amount of tan yellow tissue is identified at one end, suggestive of corpus luteum. Representative sections are submitted in five cassettes. A, B = fallopian tube C-E = sections of fibrous lesion (E is submitted following decalcification. (KL:kh 05-18-14) Stain(s) used in Diagnosis: The following stain(s) were used in diagnosing the case: MSH6, MLH1, MSH2, PMS2. The control(s) stained appropriately. Disclaimer Some of these immunohistochemical stains may have been developed and the performance characteristics determined by [facility]. Some may not have been cleared or approved by the U.S. Food and Drug Administration. The FDA has determined that such clearance or approval is not necessary. This test is used for clinical purposes. It should not be regarded as investigational or for research. This laboratory is certified under the [place] (CLIA-88) as qualified to perform high complexity clinical  laboratory testing. Report signed out from the following location(s) Technical Component was performed at [facility]. [address],[place] [address].[id number]., Technical component and interpretation was performed at [facility], [place], [place] [address]. CLIA #: [id number], 3 of 3  Assessment  Deconditioned but improving slowly  Plan:  -  solid diet per protocol   -pain control.  Try PO meds  -hypoMg - replace  -discussed pathology w patient  -colostomy care/training - change bag today.  Pt scared/anxious about it today  -depression - better w support.  Cont Zoloft.  May increase if not better  -VTE prophylaxis- SCDs, etc.  Hgb stable on Xerelto   -mobilize as tolerated to help recovery - PT/OT evals.  Pt has strong desire to go directly home.  Nephew & I guarded.  I again noted pt has activity goals to make.  I again asked her to [facility].  She is scared of falling/accidents/etc.  Tried support w CNAs in room.  Will need SNF/Rehab.  FL2 signed.  Hopefully she can inmprove w PT/OT/RN/CNA help  I updated the status of the patient to the patient and the patient's RN & CNAs.  I made recommendations.  I answered questions.  Understanding & appreciation was expressed.  Adin Hector, M.D., F.A.C.S. Gastrointestinal and Minimally Invasive Surgery [facility] [address], [place], [place] [address] [phone] Main / Paging   05/21/2014  Subjective:  Itching around colostomy.  No leak No major events Tol solids Wants IVF stopped Up in chair 3-4 hours but not walking - adraid she may urinate, stumble, etc CNAs in room finishing bathing.  Objective:  Vital signs:  Filed Vitals:   05/20/14 0538 05/20/14 1400 05/20/14 2130 05/21/14 0603  BP: 156/61 152/63 122/63 148/74  Pulse: 72 70 63 78  Temp: 98.3 F (36.8 C) 98.6 F (37 C) 97.9 F (36.6 C) 98.1 F  (36.7 C)  TempSrc: Oral Oral Oral Oral  Resp: 20 18 18 18   Height:      Weight: 52.436 kg (115 lb 9.6 oz)     SpO2: 97% 98% 98% 98%    Last BM Date: 05/21/14  Intake/Output   Yesterday:  04/10 0701 - 04/11 0700 In: 1281.3 [I.V.:1281.3] Out: 635 [Urine:150; Drains:35; Stool:450] This shift:     Bowel function:  Flatus: y  BM: y, in bag  Drain: serosanguinous  Physical Exam:  General: Pt awake/alert/oriented x4 in no acute distress.  Nervous but consolable. alert Eyes: PERRL, normal EOM.  Sclera clear.  No icterus Neuro: CN II-XII intact w/o focal sensory/motor deficits. Lymph: No head/neck/groin lymphadenopathy Psych:  No delerium/psychosis/paranoia.  Smiling HENT: Normocephalic, Mucus membranes moist.  No thrush.  Still with warbled speech - baseline Neck: Supple, No tracheal deviation Chest: No chest wall pain w good excursion CV:  Pulses intact.  Regular rhythm MS: Normal AROM mjr joints.  No obvious deformity Abdomen: Soft.  Nondistended.  Mildly tender at colostomy only.  No evidence of peritonitis.  No incarcerated hernias.  Colostomy w gas & thick liquid brown stool.  No leak/rash Ext:  SCDs BLE.  No mjr edema.  No cyanosis Skin: No petechiae / purpura  Results:   Labs: Results for orders placed or performed during the hospital encounter of 05/17/14 (from the past 48 hour(s))  Magnesium     Status: None   Collection Time: 05/20/14  5:12 AM  Result Value Ref Range   Magnesium 1.5 1.5 - 2.5 mg/dL  Creatinine, serum     Status: Abnormal   Collection Time: 05/20/14  5:12 AM  Result Value Ref Range   Creatinine, Ser 0.34 (L) 0.50 - 1.10 mg/dL   GFR calc non Af Amer >90 >90 mL/min   GFR calc Af Amer >90 >90 mL/min    Comment: (  NOTE) The eGFR has been calculated using the CKD EPI equation. This calculation has not been validated in all clinical situations. eGFR's persistently <90 mL/min signify possible Chronic Kidney Disease.   Potassium     Status: None    Collection Time: 05/20/14  5:12 AM  Result Value Ref Range   Potassium 4.3 3.5 - 5.1 mmol/L  Hemoglobin     Status: Abnormal   Collection Time: 05/20/14  5:12 AM  Result Value Ref Range   Hemoglobin 10.4 (L) 12.0 - 15.0 g/dL  Magnesium     Status: Abnormal   Collection Time: 05/21/14  5:18 AM  Result Value Ref Range   Magnesium 1.4 (L) 1.5 - 2.5 mg/dL  Creatinine, serum     Status: Abnormal   Collection Time: 05/21/14  5:18 AM  Result Value Ref Range   Creatinine, Ser 0.34 (L) 0.50 - 1.10 mg/dL   GFR calc non Af Amer >90 >90 mL/min   GFR calc Af Amer >90 >90 mL/min    Comment: (NOTE) The eGFR has been calculated using the CKD EPI equation. This calculation has not been validated in all clinical situations. eGFR's persistently <90 mL/min signify possible Chronic Kidney Disease.   Potassium     Status: None   Collection Time: 05/21/14  5:18 AM  Result Value Ref Range   Potassium 4.1 3.5 - 5.1 mmol/L  Hemoglobin     Status: Abnormal   Collection Time: 05/21/14  5:18 AM  Result Value Ref Range   Hemoglobin 10.5 (L) 12.0 - 15.0 g/dL    Imaging / Studies: No results found.  Medications / Allergies: per chart  Antibiotics: Anti-infectives    Start     Dose/Rate Route Frequency Ordered Stop   05/17/14 1224  clindamycin (CLEOCIN) 900 mg, gentamicin (GARAMYCIN) 240 mg in sodium chloride 0.9 % 1,000 mL for intraperitoneal lavage  Status:  Discontinued       As needed 05/17/14 1224 05/17/14 1224   05/17/14 0630  clindamycin (CLEOCIN) 900 mg, gentamicin (GARAMYCIN) 240 mg in sodium chloride 0.9 % 1,000 mL for intraperitoneal lavage  Status:  Discontinued    Comments:  Pharmacy may adjust dosing strength, schedule, rate of infusion, etc as needed to optimize therapy    Intraperitoneal To Surgery 05/17/14 0559 05/17/14 1441   05/17/14 0559  clindamycin (CLEOCIN) IVPB 900 mg     900 mg 100 mL/hr over 30 Minutes Intravenous 60 min pre-op 05/17/14 0559 05/17/14 0742   05/17/14 0559   gentamicin (GARAMYCIN) 260 mg in dextrose 5 % 100 mL IVPB     5 mg/kg  51.3 kg 106.5 mL/hr over 60 Minutes Intravenous 60 min pre-op 05/17/14 0559 05/17/14 0757       Note: Portions of this report may have been transcribed using voice recognition software. Every effort was made to ensure accuracy; however, inadvertent computerized transcription errors may be present.   Any transcriptional errors that result from this process are unintentional.     Adin Hector, M.D., F.A.C.S. Gastrointestinal and Minimally Invasive Surgery [facility] [address], [place], [place] [address] [phone] Main / Paging   05/21/2014

## 2014-05-21 NOTE — Evaluation (Signed)
Occupational Therapy Evaluation Patient Details Name: Bethany [name] MRN: [id number] DOB: November 16, 1928 Today's Date: 05/21/2014    History of Present Illness The patient is a 81 year old female who presents with colorectal cancer. . Underwent neoadjuvant chemoradiation therapy. recently in rehab. Persistent C. difficile.  UTI.  Feels some numbness in her left anterior thigh/has had neurology consult per nephew.. Admitted to procede with colostomy,appendectomy, and colectomy on 05/17/14   Clinical Impression   Pt with decline in function and safety with ADLs and ADL mobility safety with decreased strength, balance and endurance. Pt would benefit from acute OT services to address impairments to increase level of function and safety    Follow Up Recommendations  SNF;Supervision/Assistance - 24 hour    Equipment Recommendations       Recommendations for Other Services       Precautions / Restrictions Precautions Precautions: Fall Precaution Comments: colostomy, R drain, wear depends for incontinence Required Braces or Orthoses: Other Brace/Splint Other Brace/Splint: abdominal binder-loosely, DC if patient not  restricted to breathe Restrictions Weight Bearing Restrictions: No      Mobility Bed Mobility               General bed mobility comments: pt up in recliner  Transfers Overall transfer level: Needs assistance Equipment used: Rolling walker (2 wheeled) Transfers: Sit to/from Stand Sit to Stand: Mod assist         General transfer comment: cues for hand placement, decreased control  of descent, extra time to power up.    Balance Overall balance assessment: History of Falls Sitting-balance support: No upper extremity supported;Feet supported Sitting balance-Leahy Scale: Fair     Standing balance support: Single extremity supported;No upper extremity supported;During functional activity Standing balance-Leahy Scale: Poor Standing balance comment: stready  assist for pt. pulling up depends                            ADL Overall ADL's : Needs assistance/impaired     Grooming: Wash/dry hands;Wash/dry face;Standing;Min guard   Upper Body Bathing: Min guard;Set up;Sitting   Lower Body Bathing: Moderate assistance   Upper Body Dressing : Min guard;Set up;Sitting   Lower Body Dressing: Moderate assistance   Toilet Transfer: Moderate assistance;RW;Comfort height toilet;Grab bars;Cueing for safety   Toileting- Clothing Manipulation and Hygiene: Minimal assistance;Sit to/from stand       Functional mobility during ADLs: Moderate assistance;Cueing for safety General ADL Comments: pt and nephew states that [facility] OT educated pt on using ADL A/E for bathing and dresing but that pt does not have the A/E     Vision  wears reading glasses   Perception Perception Perception Tested?: No   Praxis Praxis Praxis tested?: Not tested    Pertinent Vitals/Pain Pain Assessment: No/denies pain Pain Score: 0-No pain     Hand Dominance Right   Extremity/Trunk Assessment Upper Extremity Assessment Upper Extremity Assessment: Generalized weakness   Lower Extremity Assessment Lower Extremity Assessment: Defer to PT evaluation   Cervical / Trunk Assessment Cervical / Trunk Assessment: Kyphotic   Communication     Cognition Arousal/Alertness: Awake/alert Behavior During Therapy: WFL for tasks assessed/performed Overall Cognitive Status: Within Functional Limits for tasks assessed                     General Comments   pt very pleasant and cooperative, nephew supportive  Home Living Family/patient expects to be discharged to:: Private residence Living Arrangements: Other relatives Available Help at Discharge: Family Type of Home: House Home Access: Stairs to enter Technical brewer of Steps: 2 Entrance Stairs-Rails: None Home Layout: One level     Bathroom Shower/Tub: Tub/shower  unit;Walk-in shower   Bathroom Toilet: Standard     Home Equipment: Environmental consultant - 2 wheels;Shower seat   Additional Comments: nephew works at night. today he states he has taken off 1 month to care for aunt.      Prior Functioning/Environment Level of Independence: Needs assistance  Gait / Transfers Assistance Needed: with RW to bathroo. went to bed  @ 6PM and only got upto bathroom. ADL's / Homemaking Assistance Needed: nephew handled housework. assist with socks and shoes and ocassionally with donning pants. pt and nephew states that [facility] OT educated pt on using ADL A/E for bathing and dresing but that pt does not have the A/E        OT Diagnosis: Generalized weakness   OT Problem List: Decreased strength;Decreased activity tolerance;Impaired balance (sitting and/or standing);Decreased knowledge of use of DME or AE   OT Treatment/Interventions: Self-care/ADL training;Therapeutic exercise;Patient/family education;Therapeutic activities;DME and/or AE instruction    OT Goals(Current goals can be found in the care plan section) Acute Rehab OT Goals Patient Stated Goal: per nephew - to go home if able. OT Goal Formulation: With patient/family Time For Goal Achievement: 05/28/14 Potential to Achieve Goals: Good ADL Goals Pt Will Perform Grooming: with set-up;with supervision;standing Pt Will Perform Upper Body Bathing: with set-up;with supervision;sitting Pt Will Perform Lower Body Bathing: with min assist;with caregiver independent in assisting;with adaptive equipment Pt Will Perform Upper Body Dressing: with set-up;with supervision;sitting Pt Will Transfer to Toilet: with min assist;with min guard assist;ambulating;regular height toilet (3 in 1 over toilet) Pt Will Perform Toileting - Clothing Manipulation and hygiene: with min guard assist;sit to/from stand Pt Will Perform Tub/Shower Transfer: with min assist;with min guard assist;shower seat  OT Frequency: Min 2X/week   Barriers to  D/C: Decreased caregiver support                        End of Session Equipment Utilized During Treatment: Rolling walker  Activity Tolerance: Patient tolerated treatment well Patient left: in chair;with call bell/phone within reach;with family/visitor present   Time: 1610-9604 OT Time Calculation (min): 23 min Charges:  OT General Charges $OT Visit: 1 Procedure OT Evaluation $Initial OT Evaluation Tier I: 1 Procedure OT Treatments $Therapeutic Activity: 8-22 mins G-Codes:    Britt Bottom 05/21/2014, 1:38 PM

## 2014-05-22 LAB — HEMOGLOBIN: HEMOGLOBIN: 10.5 g/dL — AB (ref 12.0–15.0)

## 2014-05-22 LAB — CREATININE, SERUM
Creatinine, Ser: 0.52 mg/dL (ref 0.50–1.10)
GFR calc Af Amer: >90 mL/min (ref >90)
GFR, EST NON AFRICAN AMERICAN: 85 mL/min — AB (ref >90)

## 2014-05-22 LAB — POTASSIUM: Potassium: 3.5 mmol/L (ref 3.5–5.1)

## 2014-05-22 LAB — MAGNESIUM: Magnesium: 2 mg/dL (ref 1.5–2.5)

## 2014-05-22 MED ORDER — POLYETHYLENE GLYCOL 3350 17 G PO PACK
17.0000 g | PACK | Freq: Two times a day (BID) | ORAL | Status: DC
  Administered 2014-05-22 – 2014-05-23 (×3): 17 g via ORAL
  Filled 2014-05-22 (×4): qty 1

## 2014-05-22 MED ORDER — POTASSIUM CHLORIDE CRYS ER 20 MEQ PO TBCR
40.0000 meq | EXTENDED_RELEASE_TABLET | Freq: Every day | ORAL | Status: DC
Start: 2014-05-22 — End: 2014-05-23
  Administered 2014-05-22 – 2014-05-23 (×2): 40 meq via ORAL
  Filled 2014-05-22 (×2): qty 2

## 2014-05-22 MED ORDER — POTASSIUM CHLORIDE 10 MEQ/50ML IV SOLN: 10.0000 meq | INTRAVENOUS | Status: DC | PRN

## 2014-05-22 MED ORDER — LEVALBUTEROL HCL 1.25 MG/0.5ML IN NEBU
1.2500 mg | INHALATION_SOLUTION | Freq: Four times a day (QID) | RESPIRATORY_TRACT | Status: DC | PRN
  Filled 2014-05-22: qty 0.5

## 2014-05-22 MED ORDER — SERTRALINE HCL 50 MG PO TABS
50.0000 mg | ORAL_TABLET | Freq: Every day | ORAL | Status: DC
  Administered 2014-05-22 – 2014-05-23 (×2): 50 mg via ORAL
  Filled 2014-05-22 (×2): qty 1

## 2014-05-22 MED ORDER — POLYETHYLENE GLYCOL 3350 17 G PO PACK: 17.0000 g | PACK | Freq: Two times a day (BID) | ORAL | Status: DC | PRN

## 2014-05-22 MED ORDER — HYDROCOD POLST-CHLORPHEN POLST 10-8 MG/5ML PO LQCR: 5.0000 mL | Freq: Two times a day (BID) | ORAL | Status: DC | PRN

## 2014-05-22 MED ORDER — POTASSIUM CHLORIDE CRYS ER 20 MEQ PO TBCR: 20.0000 meq | EXTENDED_RELEASE_TABLET | ORAL | Status: DC | PRN

## 2014-05-22 NOTE — Progress Notes (Signed)
CSW has contacted [facility] this am to see if they are able to make a ST Rehab bed offer. Message left for Admissions Coordinator. CSW will update pt / family when more info is available.  Werner Lean LCSW [phone]

## 2014-05-22 NOTE — Progress Notes (Signed)
Pt has a ST Rehab bed at [facility] when stable for d/c. Pt's nephew will sign admission papers today. CSW will assist with d/c planning to SNF.   Werner Lean LCSW [phone]

## 2014-05-22 NOTE — Progress Notes (Signed)
[facility]  [address] [place], [place] [address] Phone: [phone] FAX: [phone]    LUWANDA STARR [id number] 1928/07/09  CARE TEAM:  PCP: [name], MD  Outpatient Care Team: Patient Care Team: [name], MD as PCP - General (Internal Medicine) Carol Ada, MD as Referring Physician (Gastroenterology) Arloa Koh, MD as Consulting Physician (Radiation Oncology) Michael Boston, MD as Consulting Physician (General Surgery) Wyatt Portela, MD as Consulting Physician (Oncology) Adrian Prows, MD as Consulting Physician (Cardiology)  Inpatient Treatment Team: Treatment Team: Attending Provider: Michael Boston, MD; Registered Nurse: Bailey Mech, RN; Registered Nurse: Mortimer Fries, RN; Registered Nurse: Celedonio Savage, RN; Registered Nurse: Cindee Lame, RN  Problem List:   Principal Problem:   Rectal cancer s/p LAResectin/colostomy 05/17/2014 Active Problems:   Pulmonary embolism   Gastroesophageal reflux disease   Hypertension   Ovarian mass, right, s/p robotic excision 05/17/2014   Hypothyroidism   Protein-calorie malnutrition, moderate   Rash and nonspecific skin eruption   Atrial fibrillation with RVR   Anemia in neoplastic disease   Anxiety and depression   Lumbar radicular pain   Rectal cancer metastasized to liver   Hypokalemia   Hypomagnesemia   Dysphonia, spasmodic   5 Days Post-Op  Procedure(s): POST-OPERATIVE DIAGNOSIS:   Rectal Cancer Right ovarian mass  PROCEDURE: Procedure(s): XI ROBOTIC ASSISTED RECTOSIGMOID LOW ANTERIOR RESECTION END COLOSTOMY  XI ROBOTIC ASSISTED SALPINGOOOPHERECTOMY  SURGEON: Surgeon(s): Michael Boston, MD  ASSISTANT: Varney Baas, PA-C, [employer]    Assessment  Deconditioned but improving slowly  Plan:  -solid diet per protocol   -pain control.  Try PO meds  -hypoMg - replaced  -hypoK - replace  -discussed pathology w patient &  nephew  -colostomy care/training - changed bag yesterday.  Pt anxious about it today  -depression - Support.  Increase Zoloft.    -VTE prophylaxis- SCDs, etc.  Hgb stable on Xerelto   -mobilize as tolerated to help recovery - PT/OT evals.  Pt has strong desire to go directly home.  Nephew & I guarded.  I again noted pt has activity goals to make.  I again asked her to [facility].  She is scared of falling/accidents/etc.  Tried support w CNAs in room.  Will need SNF/Rehab.  FL2 signed.  Hopefully she can inmprove w PT/OT/RN/CNA help  I updated the status of the patient to the patient and the patient, her nephew, & CNAs.  I made recommendations.  I answered questions.  Understanding & appreciation was expressed.  Adin Hector, M.D., F.A.C.S. Gastrointestinal and Minimally Invasive Surgery [facility] [address], [place], [place] [address] [phone] Main / Paging   05/22/2014  Subjective:  Itching around colostomy.  No leak No major events Tol solids Walked more Tired/sore Cough Up in chair 3-4 hours.  Walked x 2- afraid she may urinate, stumble, etc CNAs in room. Afraid to leave today  Objective:  Vital signs:  Filed Vitals:   05/21/14 2230 05/22/14 0219 05/22/14 0500 05/22/14 0528  BP: 143/72 134/60  146/57  Pulse: 73 64  80  Temp: 98 F (36.7 C) 98.4 F (36.9 C)  98.3 F (36.8 C)  TempSrc: Oral Oral  Oral  Resp: 18 18  16   Height:      Weight:   50.378 kg (111 lb 1 oz)   SpO2: 96% 96%  98%    Last BM Date: 05/21/14  Intake/Output   Yesterday:  04/11 0701 -  04/12 0700 In: 600 [P.O.:600] Out: 1110 [Urine:1100; Drains:10] This shift:     Bowel function:  Flatus: y  BM: y, in bag  Drain: out  Physical Exam:  General: Pt awake/alert/oriented x4 in no acute distress.  Nervous but consolable. alert Eyes: PERRL, normal EOM.  Sclera clear.  No icterus Neuro: CN II-XII intact w/o focal sensory/motor deficits. Lymph:  No head/neck/groin lymphadenopathy Psych:  No delerium/psychosis/paranoia.  Smiling HENT: Normocephalic, Mucus membranes moist.  No thrush.  Still with warbled speech - baseline Neck: Supple, No tracheal deviation Chest: No chest wall pain w good excursion CV:  Pulses intact.  Regular rhythm MS: Normal AROM mjr joints.  No obvious deformity Abdomen: Soft.  Nondistended.  Mildly tender at colostomy only.  No evidence of peritonitis.  No incarcerated hernias.  Colostomy w gas & thick liquid brown stool.  No leak/rash Ext:  SCDs BLE.  No mjr edema.  No cyanosis Skin: No petechiae / purpura  Results:   Labs: Results for orders placed or performed during the hospital encounter of 05/17/14 (from the past 48 hour(s))  Magnesium     Status: Abnormal   Collection Time: 05/21/14  5:18 AM  Result Value Ref Range   Magnesium 1.4 (L) 1.5 - 2.5 mg/dL  Creatinine, serum     Status: Abnormal   Collection Time: 05/21/14  5:18 AM  Result Value Ref Range   Creatinine, Ser 0.34 (L) 0.50 - 1.10 mg/dL   GFR calc non Af Amer >90 >90 mL/min   GFR calc Af Amer >90 >90 mL/min    Comment: (NOTE) The eGFR has been calculated using the CKD EPI equation. This calculation has not been validated in all clinical situations. eGFR's persistently <90 mL/min signify possible Chronic Kidney Disease.   Potassium     Status: None   Collection Time: 05/21/14  5:18 AM  Result Value Ref Range   Potassium 4.1 3.5 - 5.1 mmol/L  Hemoglobin     Status: Abnormal   Collection Time: 05/21/14  5:18 AM  Result Value Ref Range   Hemoglobin 10.5 (L) 12.0 - 15.0 g/dL  Magnesium     Status: None   Collection Time: 05/22/14  5:58 AM  Result Value Ref Range   Magnesium 2.0 1.5 - 2.5 mg/dL  Creatinine, serum     Status: Abnormal   Collection Time: 05/22/14  5:58 AM  Result Value Ref Range   Creatinine, Ser 0.52 0.50 - 1.10 mg/dL    Comment: DELTA CHECK NOTED   GFR calc non Af Amer 85 (L) >90 mL/min   GFR calc Af Amer >90 >90  mL/min    Comment: (NOTE) The eGFR has been calculated using the CKD EPI equation. This calculation has not been validated in all clinical situations. eGFR's persistently <90 mL/min signify possible Chronic Kidney Disease.   Potassium     Status: None   Collection Time: 05/22/14  5:58 AM  Result Value Ref Range   Potassium 3.5 3.5 - 5.1 mmol/L  Hemoglobin     Status: Abnormal   Collection Time: 05/22/14  5:58 AM  Result Value Ref Range   Hemoglobin 10.5 (L) 12.0 - 15.0 g/dL    Imaging / Studies: No results found.  Medications / Allergies: per chart  Antibiotics: Anti-infectives    Start     Dose/Rate Route Frequency Ordered Stop   05/17/14 1224  clindamycin (CLEOCIN) 900 mg, gentamicin (GARAMYCIN) 240 mg in sodium chloride 0.9 % 1,000 mL for intraperitoneal lavage  Status:  Discontinued       As needed 05/17/14 1224 05/17/14 1224   05/17/14 0630  clindamycin (CLEOCIN) 900 mg, gentamicin (GARAMYCIN) 240 mg in sodium chloride 0.9 % 1,000 mL for intraperitoneal lavage  Status:  Discontinued    Comments:  Pharmacy may adjust dosing strength, schedule, rate of infusion, etc as needed to optimize therapy    Intraperitoneal To Surgery 05/17/14 0559 05/17/14 1441   05/17/14 0559  clindamycin (CLEOCIN) IVPB 900 mg     900 mg 100 mL/hr over 30 Minutes Intravenous 60 min pre-op 05/17/14 0559 05/17/14 0742   05/17/14 0559  gentamicin (GARAMYCIN) 260 mg in dextrose 5 % 100 mL IVPB     5 mg/kg  51.3 kg 106.5 mL/hr over 60 Minutes Intravenous 60 min pre-op 05/17/14 0559 05/17/14 0757       Diagnosis 1. Colon, segmental resection for tumor, rectosigmoid - INVASIVE ADENOCARCINOMA WITH EXTRACELLULAR MUCIN, INVADING THROUGH THE MUSCULARIS PROPRIA INTO PERICOLONIC FATTY TISSUE. - TWO EXTRAMURAL SATELLITE TUMOR NODULES. - TWELVE LYMPH NODES, NEGATIVE FOR METASTATIC CARCINOMA (0/12). - RESECTION MARGINS, NEGATIVE FOR ATYPIA OR MALIGNANCY. 2. Ovary and fallopian tube, right - BENIGN OVARIAN  FIBROMA WITH CALCIFICATIONS, NO ATYPIA OR MALIGNANCY. - BENIGN FALLOPIAN TUBAL TISSUE WITH NO EVIDENCE OF ATYPIA OR MALIGNANCY. Microscopic Comment 1. RECTUM Specimen: Rectosigmoid colon Procedure: Segmental resection Tumor site: Mid to distal portion of rectum Specimen integrity: Intact Macroscopic intactness of mesorectum: Complete Macroscopic tumor perforation: No Invasive tumor: Maximum size: 2.5 cm Histologic type(s): Invasive adenocarcinoma with extracellular mucin Histologic grade and differentiation: G2: moderately differentiated/low grade Type of polyp in which invasive carcinoma arose: N/A Microscopic extension of invasive tumor: Invading through the muscularis propria into pericolonic fatty tissue. Lymph-Vascular invasion: Not identified. Peri-neural invasion: Not identified Tumor deposit(s) (discontinuous extramural extension): Present, 2 Resection margins: Negative Proximal margin: 24.1 cm Distal margin: 3.2 cm Circumferential (radial) (posterior ascending, posterior descending; lateral 1 of 3 FINAL for STARKEISHA, VANWINKLE [phone]) Microscopic Comment(continued) and posterior mid-rectum; and entire lower 1/3 rectum): 3.4 cm Treatment effect (neo-adjuvant therapy): Tumor regression grade 3 (poor response) Additional polyp(s): N/A Non-neoplastic findings: N/A Lymph nodes: number examined 12; number positive: 0 Pathologic Staging: ypT3, ypN1c, yMX Comment: MMR stains will be performed and an addendum will follow. MSI will be sent out and the results will be availalble in EPIC. Aldona Bar MD Pathologist, Electronic Signature (Case signed 05/18/2014) Specimen [facility] and Clinical Information Specimen Comment 1. *fresh clinical research* Specimen(s) Obtained: 1. Colon, segmental resection for tumor, rectosigmoid 2. Ovary and fallopian tube, right Specimen Clinical Information 1. Rectal cancer (je) Nathanael Krist 1. Specimen: Received fresh labeled rectosigmoid Specimen  integrity: Intact, with two stapled resection margins. Per the requisition, suture designates the proximal margin. At the distal end of the specimen, the serosa is indurated (4.5 cm from the distal margin). The area is inked black. Specimen length: 30.0 cm Mesorectal intactness: Complete Tumor location: Mid to distal portion of the rectum, along the mesenteric aspect Tumor size: There is a 2.5 x 1.9 cm tan red, ulcerated lesion identified. A small amount of red brown clotted blood is present in the central aspect of the lesion. Percent of bowel circumference involved: Approximately 90% Tumor distance to margins: Proximal: 24.1 Distal: 3.2 Radial (posterior ascending, posterior descending; lateral and posterior mid-rectum; and entire lower 1/3 rectum): 3.4 cm Macroscopic extent of tumor invasion: Invades through the muscularis propria into the subserosal adipose tissue. Total presumed lymph nodes: Eleven tan gray possible lymph nodes are identified, averaging 0.2 cm  in greatest dimension. A 0.7 x 0.7 x 0.5 cm tan yellow, firm, ill defined nodule is identified proximal to the main lesion, suggestive of a secondary extramural nodule. Please note the patient is status-post radiation therapy. Extramural satellite tumor nodules: In the adipose tissue surrounding the lesion, there is a 1.4 x 0.7 x 0.7 cm tan white, firm, ill defined nodule identified. Mucosal polyp(s): No additional mucosal lesions are identified. Additional findings: The uninvolved mucosa is tan pink, with slight flattening of the mucosal folds. Proximal to the lesion the mucosa displays gray blue discoloration, suggestive of tattoo powder. The wall ranges from 0.2 to 0.5 cm. 2 of 3 FINAL for CLARENCE, DUNSMORE ([id number]) Kerrin Markman(continued) Block summary: A = proximal resection margin B, C = distal resection margin D-G = sections of lesion H = possible extramural nodule I = uninvolved mucosa J = second possible extramural  nodule K = six possible lymph nodes L = five possible lymph nodes. (KL:kh 05-18-14) 2. The specimen is received in formalin and consists of a 4.2 cm in length x 0.5 cm in diameter fimbriated portion of fallopian tube. The serosal surface is tan pink, hyperemic, with a 0.8 cm cystic structure identified in the adjacent soft tissue. The cyst is filled with tan gelatinous material. Sectioning the fallopian tube reveals a tan cut surface with a pinpoint lumen. Also received is a 5.6 x 4.0 x 3.8 cm, 40 gram nodular portion of tan white, hyperemic soft tissue. The surface displays focal adhesions. Sectioning the specimen reveals a tan white, firm solid fibrous cut surface. The central area is focally calcified, and displays a possible piece of silver metallic material. A small amount of tan yellow tissue is identified at one end, suggestive of corpus luteum. Representative sections are submitted in five cassettes. A, B = fallopian tube C-E = sections of fibrous lesion (E is submitted following decalcification. (KL:kh 05-18-14) Stain(s) used in Diagnosis: The following stain(s) were used in diagnosing the case: MSH6, MLH1, MSH2, PMS2. The control(s) stained appropriately. Disclaimer Some of these immunohistochemical stains may have been developed and the performance characteristics determined by [facility]. Some may not have been cleared or approved by the U.S. Food and Drug Administration. The FDA has determined that such clearance or approval is not necessary. This test is used for clinical purposes. It should not be regarded as investigational or for research. This laboratory is certified under the [place] (CLIA-88) as qualified to perform high complexity clinical laboratory testing. Report signed out from the following location(s) Technical Component was performed at [facility]. [address],[place] [address].[id number]., Technical component and interpretation was performed at [place], [place], [place] [address]. CLIA #: [id number], 3 of 3  Note: Portions of this report may have been transcribed using voice recognition software. Every effort was made to ensure accuracy; however, inadvertent computerized transcription errors may be present.   Any transcriptional errors that result from this process are unintentional.     Adin Hector, M.D., F.A.C.S. Gastrointestinal and Minimally Invasive Surgery [facility] [address], [place],  [address] [phone] Main / Paging   05/22/2014

## 2014-05-23 LAB — POTASSIUM: POTASSIUM: 4.1 mmol/L (ref 3.5–5.1)

## 2014-05-23 LAB — MAGNESIUM: MAGNESIUM: 1.7 mg/dL (ref 1.5–2.5)

## 2014-05-23 LAB — HEMOGLOBIN: HEMOGLOBIN: 10.5 g/dL — AB (ref 12.0–15.0)

## 2014-05-23 MED ORDER — POTASSIUM CHLORIDE CRYS ER 20 MEQ PO TBCR: 40.0000 meq | EXTENDED_RELEASE_TABLET | Freq: Every day | ORAL | Status: DC

## 2014-05-23 MED ORDER — SERTRALINE HCL 50 MG PO TABS: 50.0000 mg | ORAL_TABLET | Freq: Every day | ORAL | Status: DC

## 2014-05-23 MED ORDER — MAGNESIUM CHLORIDE 64 MG PO TBEC: 1.0000 | DELAYED_RELEASE_TABLET | Freq: Every day | ORAL | Status: DC

## 2014-05-23 MED ORDER — POLYETHYLENE GLYCOL 3350 17 G PO PACK: 17.0000 g | PACK | Freq: Two times a day (BID) | ORAL | Status: DC

## 2014-05-23 MED ORDER — POLYETHYLENE GLYCOL 3350 17 G PO PACK: 17.0000 g | PACK | Freq: Two times a day (BID) | ORAL | Status: DC | PRN

## 2014-05-23 NOTE — Progress Notes (Signed)
Clinical Social Work Department CLINICAL SOCIAL WORK PLACEMENT NOTE 05/23/2014  Patient:  Bethany [name], Bethany [name]  Account Number:  [id number] Admit date:  05/17/2014  Clinical Social Worker:  Werner Lean, LCSW  Date/time:  05/23/2014 01:02 PM  Clinical Social Work is seeking post-discharge placement for this patient at the following level of care:   SKILLED NURSING   (*CSW will update this form in Epic as items are completed)   05/19/2014  Patient/family provided with [place] [facility] Department of Clinical Social Work's list of facilities offering this level of care within the geographic area requested by the patient (or if unable, by the patient's family).  05/19/2014  Patient/family informed of their freedom to choose among providers that offer the needed level of care, that participate in Medicare, Medicaid or managed care program needed by the patient, have an available bed and are willing to accept the patient.  05/19/2014  Patient/family informed of [facility]' ownership interest in [facility], as well as of the fact that they are under no obligation to receive care at this facility.  PASARR submitted to EDS on 05/19/2014 PASARR number received on 05/19/2014  FL2 transmitted to all facilities in geographic area requested by pt/family on  05/19/2014 FL2 transmitted to all facilities within larger geographic area on   Patient informed that his/her managed care company has contracts with or will negotiate with  certain facilities, including the following:     Patient/family informed of bed offers received:  05/23/2014 Patient chooses bed at [facility] Physician recommends and patient chooses bed at    Patient to be transferred to [facility] on  05/23/2014 Patient to be transferred to facility by [facility] Patient and family notified of transfer on 05/23/2014 Name of family member notified:  NEPHEW  The following physician request were entered in  Epic:   Additional Comments: Pt / nephew are in agreement with d/c to SNF today. [facility] transport required. Pt / nephew are aware that out of pocket costs are associated with [facility] transport. NSG reviewed d/c summary, scripts, avs. Scripts included in d/c packet.  Werner Lean LCSW [phone]

## 2014-05-23 NOTE — Discharge Summary (Addendum)
Physician Discharge Summary  Patient ID: Bethany [name] MRN: [id number] DOB/AGE: 1928/09/22 81 y.o.  Admit date: 05/17/2014 Discharge date: 05/23/2014  Patient Care Team: [name], MD as PCP - General (Internal Medicine) Carol Ada, MD as Referring Physician (Gastroenterology) Arloa Koh, MD as Consulting Physician (Radiation Oncology) Michael Boston, MD as Consulting Physician (General Surgery) Wyatt Portela, MD as Consulting Physician (Oncology) Adrian Prows, MD as Consulting Physician (Cardiology)  Admission Diagnoses: Principal Problem:   Rectal cancer s/p LAResectin/colostomy 05/17/2014 Active Problems:   Pulmonary embolism   Gastroesophageal reflux disease   Hypertension   Ovarian mass, right, s/p robotic excision 05/17/2014   Hypothyroidism   Protein-calorie malnutrition, moderate   Rash and nonspecific skin eruption   Atrial fibrillation with RVR   Anemia in neoplastic disease   Anxiety and depression   Lumbar radicular pain   Rectal cancer metastasized to liver   Hypokalemia   Hypomagnesemia   Dysphonia, spasmodic   Discharge Diagnoses:  Principal Problem:   Rectal cancer s/p LAResectin/colostomy 05/17/2014 Active Problems:   Pulmonary embolism   Gastroesophageal reflux disease   Hypertension   Ovarian mass, right, s/p robotic excision 05/17/2014   Hypothyroidism   Protein-calorie malnutrition, moderate   Rash and nonspecific skin eruption   Atrial fibrillation with RVR   Anemia in neoplastic disease   Anxiety and depression   Lumbar radicular pain   Rectal cancer metastasized to liver   Hypokalemia   Hypomagnesemia   Dysphonia, spasmodic   POST-OPERATIVE DIAGNOSIS:  Rectal Cancer Right ovarian mass  PROCEDURE: Procedure(s): XI ROBOTIC ASSISTED RECTOSIGMOID LOW ANTERIOR RESECTION END COLOSTOMY  XI ROBOTIC ASSISTED SALPINGOOOPHERECTOMY  SURGEON: Michael Boston, MD  Consults: PT/OT/SW [facility] Course:   The patient underwent  the surgery  above.  Postoperatively, the patient gradually mobilized and advanced to a solid diet.  Pain and other symptoms were treated aggressively.  Low K & Mg replaced.  Xerelto restarted with stable Hgb    By the time of discharge, the patient was walking with help in the hallways, eating food, having flatus.  Pain was well-controlled on an oral medications.  Ostomy bag training/care done.  Based on meeting discharge criteria and continuing to recover, I felt it was safe for the patient to be discharged from the hospital to further recover with close followup. SNF with Rehab to help her regain some independence before going home.  Postoperative recommendations were discussed in detail to the patient & her caregiver nephew.  They are written as well.   Significant Diagnostic Studies:  Results for orders placed or performed during the hospital encounter of 05/17/14 (from the past 72 hour(s))  Magnesium     Status: Abnormal   Collection Time: 05/21/14  5:18 AM  Result Value Ref Range   Magnesium 1.4 (L) 1.5 - 2.5 mg/dL  Creatinine, serum     Status: Abnormal   Collection Time: 05/21/14  5:18 AM  Result Value Ref Range   Creatinine, Ser 0.34 (L) 0.50 - 1.10 mg/dL   GFR calc non Af Amer >90 >90 mL/min   GFR calc Af Amer >90 >90 mL/min    Comment: (NOTE) The eGFR has been calculated using the CKD EPI equation. This calculation has not been validated in all clinical situations. eGFR's persistently <90 mL/min signify possible Chronic Kidney Disease.   Potassium     Status: None   Collection Time: 05/21/14  5:18 AM  Result Value Ref Range   Potassium 4.1 3.5 - 5.1 mmol/L  Hemoglobin  Status: Abnormal   Collection Time: 05/21/14  5:18 AM  Result Value Ref Range   Hemoglobin 10.5 (L) 12.0 - 15.0 g/dL  Magnesium     Status: None   Collection Time: 05/22/14  5:58 AM  Result Value Ref Range   Magnesium 2.0 1.5 - 2.5 mg/dL  Creatinine, serum     Status: Abnormal   Collection Time: 05/22/14  5:58 AM   Result Value Ref Range   Creatinine, Ser 0.52 0.50 - 1.10 mg/dL    Comment: DELTA CHECK NOTED   GFR calc non Af Amer 85 (L) >90 mL/min   GFR calc Af Amer >90 >90 mL/min    Comment: (NOTE) The eGFR has been calculated using the CKD EPI equation. This calculation has not been validated in all clinical situations. eGFR's persistently <90 mL/min signify possible Chronic Kidney Disease.   Potassium     Status: None   Collection Time: 05/22/14  5:58 AM  Result Value Ref Range   Potassium 3.5 3.5 - 5.1 mmol/L  Hemoglobin     Status: Abnormal   Collection Time: 05/22/14  5:58 AM  Result Value Ref Range   Hemoglobin 10.5 (L) 12.0 - 15.0 g/dL  Magnesium     Status: None   Collection Time: 05/23/14  4:30 AM  Result Value Ref Range   Magnesium 1.7 1.5 - 2.5 mg/dL  Potassium     Status: None   Collection Time: 05/23/14  4:30 AM  Result Value Ref Range   Potassium 4.1 3.5 - 5.1 mmol/L  Hemoglobin     Status: Abnormal   Collection Time: 05/23/14  4:30 AM  Result Value Ref Range   Hemoglobin 10.5 (L) 12.0 - 15.0 g/dL   Diagnosis 1. Colon, segmental resection for tumor, rectosigmoid - INVASIVE ADENOCARCINOMA WITH EXTRACELLULAR MUCIN, INVADING THROUGH THE MUSCULARIS PROPRIA INTO PERICOLONIC FATTY TISSUE. - TWO EXTRAMURAL SATELLITE TUMOR NODULES. - TWELVE LYMPH NODES, NEGATIVE FOR METASTATIC CARCINOMA (0/12). - RESECTION MARGINS, NEGATIVE FOR ATYPIA OR MALIGNANCY. 2. Ovary and fallopian tube, right - BENIGN OVARIAN FIBROMA WITH CALCIFICATIONS, NO ATYPIA OR MALIGNANCY. - BENIGN FALLOPIAN TUBAL TISSUE WITH NO EVIDENCE OF ATYPIA OR MALIGNANCY. Microscopic Comment 1. RECTUM Specimen: Rectosigmoid colon Procedure: Segmental resection Tumor site: Mid to distal portion of rectum Specimen integrity: Intact Macroscopic intactness of mesorectum: Complete Macroscopic tumor perforation: No Invasive tumor: Maximum size: 2.5 cm Histologic type(s): Invasive adenocarcinoma with extracellular  mucin Histologic grade and differentiation: G2: moderately differentiated/low grade Type of polyp in which invasive carcinoma arose: N/A Microscopic extension of invasive tumor: Invading through the muscularis propria into pericolonic fatty tissue. Lymph-Vascular invasion: Not identified. Peri-neural invasion: Not identified Tumor deposit(s) (discontinuous extramural extension): Present, 2 Resection margins: Negative Proximal margin: 24.1 cm Distal margin: 3.2 cm Circumferential (radial) (posterior ascending, posterior descending; lateral 1 of 3 FINAL for EMMERSON, SHUFFIELD [phone]) Microscopic Comment(continued) and posterior mid-rectum; and entire lower 1/3 rectum): 3.4 cm Treatment effect (neo-adjuvant therapy): Tumor regression grade 3 (poor response) Additional polyp(s): N/A Non-neoplastic findings: N/A Lymph nodes: number examined 12; number positive: 0 Pathologic Staging: ypT3, ypN1c, yMX Comment: MMR stains will be performed and an addendum will follow. MSI will be sent out and the results will be availalble in EPIC. Aldona Bar MD Pathologist, Electronic Signature (Case signed 05/18/2014) Specimen Dorenda Pfannenstiel and Clinical Information Specimen Comment 1. *fresh clinical research* Specimen(s) Obtained: 1. Colon, segmental resection for tumor, rectosigmoid 2. Ovary and fallopian tube, right Specimen Clinical Information 1. Rectal cancer (je) [place] 1. Specimen: Received fresh  labeled rectosigmoid Specimen integrity: Intact, with two stapled resection margins. Per the requisition, suture designates the proximal margin. At the distal end of the specimen, the serosa is indurated (4.5 cm from the distal margin). The area is inked black. Specimen length: 30.0 cm Mesorectal intactness: Complete Tumor location: Mid to distal portion of the rectum, along the mesenteric aspect Tumor size: There is a 2.5 x 1.9 cm tan red, ulcerated lesion identified. A small amount of red brown  clotted blood is present in the central aspect of the lesion. Percent of bowel circumference involved: Approximately 90% Tumor distance to margins: Proximal: 24.1 Distal: 3.2 Radial (posterior ascending, posterior descending; lateral and posterior mid-rectum; and entire lower 1/3 rectum): 3.4 cm Macroscopic extent of tumor invasion: Invades through the muscularis propria into the subserosal adipose tissue. Total presumed lymph nodes: Eleven tan gray possible lymph nodes are identified, averaging 0.2 cm in greatest dimension. A 0.7 x 0.7 x 0.5 cm tan yellow, firm, ill defined nodule is identified proximal to the main lesion, suggestive of a secondary extramural nodule. Please note the patient is status-post radiation therapy. Extramural satellite tumor nodules: In the adipose tissue surrounding the lesion, there is a 1.4 x 0.7 x 0.7 cm tan white, firm, ill defined nodule identified. Mucosal polyp(s): No additional mucosal lesions are identified. Additional findings: The uninvolved mucosa is tan pink, with slight flattening of the mucosal folds. Proximal to the lesion the mucosa displays gray blue discoloration, suggestive of tattoo powder. The wall ranges from 0.2 to 0.5 cm. 2 of 3 FINAL for AYISHA, POL ([id number]) Kino Dunsworth(continued) Block summary: A = proximal resection margin B, C = distal resection margin D-G = sections of lesion H = possible extramural nodule I = uninvolved mucosa J = second possible extramural nodule K = six possible lymph nodes L = five possible lymph nodes. (KL:kh 05-18-14) 2. The specimen is received in formalin and consists of a 4.2 cm in length x 0.5 cm in diameter fimbriated portion of fallopian tube. The serosal surface is tan pink, hyperemic, with a 0.8 cm cystic structure identified in the adjacent soft tissue. The cyst is filled with tan gelatinous material. Sectioning the fallopian tube reveals a tan cut surface with a pinpoint lumen. Also received is a  5.6 x 4.0 x 3.8 cm, 40 gram nodular portion of tan white, hyperemic soft tissue. The surface displays focal adhesions. Sectioning the specimen reveals a tan white, firm solid fibrous cut surface. The central area is focally calcified, and displays a possible piece of silver metallic material. A small amount of tan yellow tissue is identified at one end, suggestive of corpus luteum. Representative sections are submitted in five cassettes. A, B = fallopian tube C-E = sections of fibrous lesion (E is submitted following decalcification. (KL:kh 05-18-14) Stain(s) used in Diagnosis: The following stain(s) were used in diagnosing the case: MSH6, MLH1, MSH2, PMS2. The control(s) stained appropriately. Disclaimer Some of these immunohistochemical stains may have been developed and the performance characteristics determined by [facility]. Some may not have been cleared or approved by the U.S. Food and Drug Administration. The FDA has determined that such clearance or approval is not necessary. This test is used for clinical purposes. It should not be regarded as investigational or for research. This laboratory is certified under the [place] (CLIA-88) as qualified to perform high complexity clinical laboratory testing. Report signed out from the following location(s) Technical Component was performed at [facility]. [address],[place] [address].[id number]., Technical component and interpretation was performed at [place], [place], [place] [address]. CLIA #: [id number], 3 of 3  Note: Portions of this report may have been transcribed using voice recognition software. Every effort was made to ensure accuracy; however, inadvertent computerized transcription errors may be present. Any transcriptional errors that result from this process are unintentional.  No results  found.  Discharge Exam: Blood pressure 125/59, pulse 86, temperature 98.2 F (36.8 C), temperature source Oral, resp. rate 18, height 5' 6"  (1.676 m), weight 50.378 kg (111 lb 1 oz), SpO2 97 %.  General: Pt awake/alert/oriented x4 in no major acute distress Eyes: PERRL, normal EOM. Sclera nonicteric Neuro: CN II-XII intact w/o focal sensory/motor deficits. Lymph: No head/neck/groin lymphadenopathy Psych:  No delerium/psychosis/paranoia HENT: Normocephalic, Mucus membranes moist.  No thrush Neck: Supple, No tracheal deviation Chest: No pain.  Good respiratory excursion.  Lungs clear.  No cough CV:  Pulses intact.  Regular rhythm MS: Normal AROM mjr joints.  No obvious deformity Abdomen: Soft, Nondistended.  Min tender at pink colostomy only.  Gas & paste stool in colostomy bag.  No incarcerated hernias. Ext:  SCDs BLE.  No significant edema.  No cyanosis Skin: No petechiae / purpura  Discharged Condition: fair but improving   Past Medical History  Diagnosis Date  . Migraine   . GERD (gastroesophageal reflux disease)   . Hypercholesteremia   . Pulmonary embolism 01/01/2012  / 11/28/2013  . Hypothyroidism   . Rectal mass   . Ovarian tumor     sees dr Lisbeth Renshaw ob-gyn yearly for evaluation  . Cancer 09/29/13    rectal adenocarcinoma  . Rectal cancer 09/29/13    invasive adeocarcinoma  . Hx of radiation therapy 10/25/13-11/28/13    pelvis, 4500 cGy in 25 sessions  . Incontinence of urine   . Hypertension     stopped med during chemo /radiation - BP has been stable  . Numbness     "I have no feeling in my left leg" unknown cause  . Arthritis   . History of skin cancer     removed from forehead  . Rosacea   . Rectal cancer   . Frequent bowel movements   . Hx: UTI (urinary tract infection)   . Depression   . Anxiety   . Severe sepsis with septic shock 11/28/2013  . UTI (lower urinary tract infection) 01/23/2014  . Acute encephalopathy 12/08/2013  . Clostridium difficile  colitis 12/08/2013  . Cor pulmonale, acute 01/02/2012  . Fall from standing 01/22/2014  . Hypokalemia 11/28/2013  . Dysphonia, spasmodic 05/18/2014    Past Surgical History  Procedure Laterality Date  . Ovarian cyst surgery  yrs ago  . Cataract extraction Bilateral   . Colonoscopy N/A 09/22/2013    Procedure: COLONOSCOPY;  Surgeon: Beryle Beams, MD;  Location: [facility];  Service: Endoscopy;  Laterality: N/A;  . Eus N/A 09/29/2013    Procedure: LOWER ENDOSCOPIC ULTRASOUND (EUS);  Surgeon: Beryle Beams, MD;  Location: [facility];  Service: Endoscopy;  Laterality: N/A;  . Abdominal hysterectomy      years ago, prolapsed uterus    History   Social History  . Marital Status: Single    Spouse Name: N/A  . Number of Children: 0  . Years of Education: [employer]   Occupational History  . Retired Other   Social History Main Topics  . Smoking status: Never Smoker   . Smokeless tobacco:  Never Used  . Alcohol Use: No  . Drug Use: No  . Sexual Activity: Not on file   Other Topics Concern  . Not on file   Social History Narrative   Patient lives at home with her nephew.   Caffeine Use: 8oz daily    Family History  Problem Relation Age of Onset  . Brain cancer Mother   . Heart attack Father   . Diabetes Sister   . Heart disease Sister     Current Facility-Administered Medications  Medication Dose Route Frequency Provider Last Rate Last Dose  . acetaminophen (TYLENOL) tablet 650 mg  650 mg Oral TID Michael Boston, MD   650 mg at 05/22/14 2227  . alum & mag hydroxide-simeth (MAALOX/MYLANTA) 200-200-20 MG/5ML suspension 30 mL  30 mL Oral Q6H PRN Michael Boston, MD      . chlorpheniramine-HYDROcodone (TUSSIONEX) 10-8 MG/5ML suspension 5 mL  5 mL Oral Q12H PRN Michael Boston, MD      . cholecalciferol (VITAMIN D) tablet 2,000 Units  2,000 Units Oral Daily Michael Boston, MD   2,000 Units at 05/22/14 1054  . diphenhydrAMINE (BENADRYL) 12.5 MG/5ML elixir 12.5 mg  12.5 mg Oral Q6H  PRN Michael Boston, MD       Or  . diphenhydrAMINE (BENADRYL) injection 12.5 mg  12.5 mg Intravenous Q6H PRN Michael Boston, MD      . feeding supplement (PRO-STAT SUGAR FREE 64) liquid 30 mL  30 mL Oral BID Clayton Bibles, RD   30 mL at 05/22/14 2228  . feeding supplement (RESOURCE BREEZE) (RESOURCE BREEZE) liquid 1 Container  1 Container Oral BID BM Michael Boston, MD   1 Container at 05/22/14 1356  . gabapentin (NEURONTIN) capsule 100 mg  100 mg Oral TID Michael Boston, MD   100 mg at 05/22/14 2227  . HYDROcodone-acetaminophen (NORCO/VICODIN) 5-325 MG per tablet 1-2 tablet  1-2 tablet Oral Q4H PRN Michael Boston, MD      . HYDROmorphone (DILAUDID) injection 0.5-1 mg  0.5-1 mg Intravenous Q2H PRN Michael Boston, MD   1 mg at 05/22/14 0227  . lactated ringers bolus 1,000 mL  1,000 mL Intravenous Q8H PRN Michael Boston, MD      . levalbuterol Penne Lash) nebulizer solution 1.25 mg  1.25 mg Nebulization Q6H PRN Michael Boston, MD      . levothyroxine (SYNTHROID, LEVOTHROID) tablet 25 mcg  25 mcg Oral Once per day on Sun Mon Wed Fri Michael Boston, MD   25 mcg at 05/21/14 [phone]  . levothyroxine (SYNTHROID, LEVOTHROID) tablet 75 mcg  75 mcg Oral Once per day on Tue Thu Sat Michael Boston, MD   75 mcg at 05/22/14 1607  . lip balm (CARMEX) ointment 1 application  1 application Topical BID Michael Boston, MD   1 application at 37/10/62 2228  . magic mouthwash  15 mL Oral QID PRN Michael Boston, MD      . magnesium chloride (SLOW-MAG) 64 MG SR tablet 64 mg  1 tablet Oral BID Michael Boston, MD   64 mg at 05/22/14 2227  . menthol-cetylpyridinium (CEPACOL) lozenge 3 mg  1 lozenge Oral PRN Michael Boston, MD      . metoprolol (LOPRESSOR) injection 5 mg  5 mg Intravenous Q6H PRN Michael Boston, MD      . metoprolol tartrate (LOPRESSOR) tablet 12.5 mg  12.5 mg Oral Q12H PRN Michael Boston, MD      . ondansetron [facility]) tablet 4 mg  4 mg Oral Q6H PRN Michael Boston, MD  Or  . ondansetron (ZOFRAN) injection 4 mg  4 mg Intravenous Q6H PRN  Michael Boston, MD   4 mg at 05/18/14 2015  . phenol (CHLORASEPTIC) mouth spray 2 spray  2 spray Mouth/Throat PRN Michael Boston, MD      . polyethylene glycol (MIRALAX / GLYCOLAX) packet 17 g  17 g Oral BID Michael Boston, MD   17 g at 05/22/14 2228  . polyethylene glycol (MIRALAX / GLYCOLAX) packet 17 g  17 g Oral Q12H PRN Michael Boston, MD      . potassium chloride 10 mEq in 50 mL *CENTRAL LINE* IVPB  10 mEq Intravenous Q1H PRN Michael Boston, MD      . potassium chloride SA (K-DUR,KLOR-CON) CR tablet 20 mEq  20 mEq Oral Q4H PRN Michael Boston, MD      . potassium chloride SA (K-DUR,KLOR-CON) CR tablet 40 mEq  40 mEq Oral Daily Michael Boston, MD   40 mEq at 05/22/14 1055  . promethazine (PHENERGAN) injection 6.25-12.5 mg  6.25-12.5 mg Intravenous Q4H PRN Michael Boston, MD      . rivaroxaban Alveda Reasons) tablet 20 mg  20 mg Oral Q supper Emiliano Dyer, RPH   20 mg at 05/22/14 1607  . saccharomyces boulardii (FLORASTOR) capsule 250 mg  250 mg Oral BID Michael Boston, MD   250 mg at 05/22/14 2227  . sertraline (ZOLOFT) tablet 50 mg  50 mg Oral Daily Michael Boston, MD   50 mg at 05/22/14 1054  . zolpidem (AMBIEN) tablet 5 mg  5 mg Oral QHS PRN Michael Boston, MD         Allergies  Allergen Reactions  . Cataflam [Diclofenac] Other (See Comments)    Unknown; patient is unaware of allergy.  . Cephalosporins Shortness Of Breath  . Erythromycin Shortness Of Breath  . Keflex [Cephalexin] Shortness Of Breath  . Propoxyphene     Unknown rxn  . Codeine Nausea And Vomiting  . Naproxen Other (See Comments)    Unknown; patient is unaware of allergy  . Penicillins Other (See Comments)    Patient states that she CAN take this; unaware of allergy  . Synalgos-Dc [Aspirin-Caff-Dihydrocodeine] Other (See Comments)    Unknown; patient is unaware of allergy  . Ultram [Tramadol] Other (See Comments)    Unknown; patient is unaware of allergy    Disposition: 03-Skilled Nursing Facility  Discharge Instructions    Call MD  for:  extreme fatigue    Complete by:  As directed      Call MD for:  extreme fatigue    Complete by:  As directed      Call MD for:  hives    Complete by:  As directed      Call MD for:  hives    Complete by:  As directed      Call MD for:  persistant nausea and vomiting    Complete by:  As directed      Call MD for:  persistant nausea and vomiting    Complete by:  As directed      Call MD for:  redness, tenderness, or signs of infection (pain, swelling, redness, odor or green/yellow discharge around incision site)    Complete by:  As directed      Call MD for:  redness, tenderness, or signs of infection (pain, swelling, redness, odor or green/yellow discharge around incision site)    Complete by:  As directed      Call MD for:  severe  uncontrolled pain    Complete by:  As directed      Call MD for:  severe uncontrolled pain    Complete by:  As directed      Call MD for:    Complete by:  As directed   Temperature > 101.33F     Call MD for:    Complete by:  As directed   Temperature > 101.33F     Diet - low sodium heart healthy    Complete by:  As directed      Diet - low sodium heart healthy    Complete by:  As directed      Discharge instructions    Complete by:  As directed   Please see discharge instruction sheets.  Also refer to handout given an office.  Please call our office if you have any questions or concerns [phone]     Discharge instructions    Complete by:  As directed   Please see discharge instruction sheets.  Also refer to handout given an office.  Please call our office if you have any questions or concerns [phone]     Discharge wound care:    Complete by:  As directed   If you have closed incisions, shower and bathe over these incisions with soap and water every day.  Remove all surgical dressings on postoperative day #3.  You do not need to replace dressings over the closed incisions unless you feel more comfortable with a Band-Aid covering it.    If you have an open wound that requires packing, please see wound care instructions.  In general, remove all dressings, wash wound with soap and water and then replace with saline moistened gauze.  Do the dressing change at least every day.  Please call our office [phone] if you have further questions.     Discharge wound care:    Complete by:  As directed   If you have closed incisions, shower and bathe over these incisions with soap and water every day.  Remove all surgical dressings on postoperative day #3.  You do not need to replace dressings over the closed incisions unless you feel more comfortable with a Band-Aid covering it.   If you have an open wound that requires packing, please see wound care instructions.  In general, remove all dressings, wash wound with soap and water and then replace with saline moistened gauze.  Do the dressing change at least every day.  Please call our office [phone] if you have further questions.     Driving Restrictions    Complete by:  As directed   No driving until off narcotics and can safely swerve away without pain during an emergency     Driving Restrictions    Complete by:  As directed   No driving until off narcotics and can safely swerve away without pain during an emergency     Increase activity slowly    Complete by:  As directed   Walk an hour a day.  Use 20-30 minute walks.  When you can walk 30 minutes without difficulty, increase to low impact/moderate activities such as biking, jogging, swimming, sexual activity..  Eventually can increase to unrestricted activity when not feeling pain.  If you feel pain: STOP!Marland Kitchen   Let pain protect you from overdoing it.  Use ice/heat/over-the-counter pain medications to help minimize his soreness.  Use pain prescriptions as needed to remain active.  It is better to take extra pain medications  and be more active than to stay bedridden to avoid all pain medications.     Increase activity slowly     Complete by:  As directed   Walk an hour a day.  Use 20-30 minute walks.  When you can walk 30 minutes without difficulty, increase to low impact/moderate activities such as biking, jogging, swimming, sexual activity..  Eventually can increase to unrestricted activity when not feeling pain.  If you feel pain: STOP!Marland Kitchen   Let pain protect you from overdoing it.  Use ice/heat/over-the-counter pain medications to help minimize his soreness.  Use pain prescriptions as needed to remain active.  It is better to take extra pain medications and be more active than to stay bedridden to avoid all pain medications.     Lifting restrictions    Complete by:  As directed   Avoid heavy lifting initially.  Do not push through pain.  You have no specific weight limit.  Coughing and sneezing or four more stressful to your incision than any lifting you will do. Pain will protect you from injury.  Therefore, avoid intense activity until off all narcotic pain medications.  Coughing and sneezing or four more stressful to your incision than any lifting he will do.     Lifting restrictions    Complete by:  As directed   Avoid heavy lifting initially.  Do not push through pain.  You have no specific weight limit.  Coughing and sneezing or four more stressful to your incision than any lifting you will do. Pain will protect you from injury.  Therefore, avoid intense activity until off all narcotic pain medications.  Coughing and sneezing or four more stressful to your incision than any lifting he will do.     May shower / Bathe    Complete by:  As directed      May shower / Bathe    Complete by:  As directed      May walk up steps    Complete by:  As directed      May walk up steps    Complete by:  As directed      Sexual Activity Restrictions    Complete by:  As directed   Sexual activity as tolerated.  Do not push through pain.  Pain will protect you from injury.     Sexual Activity Restrictions    Complete by:  As  directed   Sexual activity as tolerated.  Do not push through pain.  Pain will protect you from injury.     Walk with assistance    Complete by:  As directed   Walk over an hour a day.  May use a walker/cane/companion to help with balance and stamina.     Walk with assistance    Complete by:  As directed   Walk over an hour a day.  May use a walker/cane/companion to help with balance and stamina.            Medication List    TAKE these medications        acetaminophen 325 MG tablet  Commonly known as:  TYLENOL  Take 2 tablets (650 mg total) by mouth every 6 (six) hours as needed for mild pain (or Fever >/= 101).     cholecalciferol 1000 UNITS tablet  Commonly known as:  VITAMIN D  Take 2,000 Units by mouth daily.     feeding supplement (RESOURCE BREEZE) Liqd  Take 1 Container by mouth 2 (two) times  daily between meals.     gabapentin 100 MG capsule  Commonly known as:  NEURONTIN  Take 1 capsule (100 mg total) by mouth 3 (three) times daily.     HYDROcodone-acetaminophen 5-325 MG per tablet  Commonly known as:  NORCO/VICODIN  Take 1-2 tablets by mouth every 6 (six) hours as needed for moderate pain or severe pain.     levothyroxine 75 MCG tablet  Commonly known as:  SYNTHROID, LEVOTHROID  Take 1 tablet by mouth See admin instructions. 62mg on Tuesday, Thursday, Saturday.     levothyroxine 25 MCG tablet  Commonly known as:  SYNTHROID, LEVOTHROID  Take 25 mcg by mouth See admin instructions. 25 mcg  on Monday, Wednesday, Friday and Sunday.     magnesium chloride 64 MG Tbec SR tablet  Commonly known as:  SLOW-MAG  Take 1 tablet (64 mg total) by mouth daily.     potassium chloride SA 20 MEQ tablet  Commonly known as:  K-DUR,KLOR-CON  Take 2 tablets (40 mEq total) by mouth daily.     rivaroxaban 20 MG Tabs tablet  Commonly known as:  XARELTO  Take 1 tablet (20 mg total) by mouth daily with supper.     sertraline 50 MG tablet  Commonly known as:  ZOLOFT  Take 1  tablet (50 mg total) by mouth daily.           Follow-up Information    Follow up with Pairlee Sawtell C., MD In 2 weeks.   Specialty:  General Surgery   Why:  To follow up after your operation, To follow up after your hospital stay   Contact information:   [place] [phone]       Signed: GMorton Peters M.D., F.A.C.S. Gastrointestinal and Minimally Invasive [facility] [address] [place] [phone] / Paging   05/23/2014, 7:01 AM

## 2014-05-23 NOTE — Progress Notes (Signed)
Report called to [facility] at [facility].  All questions answered.  [facility] to transport pt, nephew present at time of dc.  Pt left on stretcher in stable condition.  Personal belongings taken to facility by nephew.

## 2014-05-23 NOTE — Care Management Note (Signed)
    Page 1 of 1   05/23/2014     11:34:12 AM CARE MANAGEMENT NOTE 05/23/2014  Patient:  Bethany [name], Bethany [name]   Account Number:  [id number]  Date Initiated:  05/17/2014  Documentation initiated by:  DAVIS,RHONDA  Subjective/Objective Assessment:   XI ROBOTIC ASSISTED RECTOSIGMOID LOW ANTERIOR RESECTION AND COLOSTOMY  (N/A)/hypotensive post op     Action/Plan:   discharge planning   Anticipated DC Date:  05/23/2014   Anticipated DC Plan:  SKILLED NURSING FACILITY  In-house referral  Clinical Social Worker      DC Planning Services  CM consult      [facility] Choice  NA   Choice offered to / List presented to:  NA      DME agency  NA        [place] agency  NA   Status of service:  Completed, signed off Medicare Important Message given?  YES (If response is "NO", the following Medicare IM given date fields will be blank) Date Medicare IM given:  05/22/2014 Medicare IM given by:  [facility] Date Additional Medicare IM given:   Additional Medicare IM given by:    Discharge Disposition:  [place]  Per UR Regulation:  Reviewed for med. necessity/level of care/duration of stay  If discussed at [place] of Stay Meetings, dates discussed:    Comments:  May 17, 2014/Rhonda L. Rosana Hoes, RN, BSN, CCM. Case Management [place] [phone] No discharge needs present of time of review.

## 2014-05-23 NOTE — Discharge Instructions (Signed)
Ostomy Support Information  Yes, Bethany [name] heard that people get along just fine with only one of their eyes, or one of their lungs, or one of their kidneys. But you also know that you have only one intestine and only one bladder, and that leaves you feeling awfully empty, both physically and emotionally: You think no other people go around without part of their intestine with the ends of their intestines sticking out through their abdominal walls.  Well, you are wrong! There are nearly three quarters of a million people in the [place] who have an ostomy; people who have had surgery to remove all or part of their colons or bladders. There is even a national association, the [place] [employer] [place] with over 350 local affiliated support groups that are organized by volunteers who provide peer support and counseling. Bethany [name] has a toll free telephone num-ber, ([phone] and an educational,  interactive website, www.ostomy.org   An ostomy is an opening in the belly (abdominal wall) made by surgery. Ostomates are people who have had this procedure. The opening (stoma) allows the kidney or bowel to discharge waste. An external pouch covers the stoma to collect waste. Pouches are are a simple bag and are odor free. Different companies have disposable or reusable pouches to fit one's lifestyle. An ostomy can either be temporary or permanent.  THERE ARE THREE MAIN TYPES OF OSTOMIES  Colostomy. A colostomy is a surgically created opening in the large intestine (colon).  Ileostomy. An ileostomy is a surgically created opening in the small intestine.  Urostomy. A urostomy is a surgically created opening to divert urine away from the bladder. FREQUENTLY ASKED QUESTIONS   Why havent you met any of these folks who have an ostomy?  Well, maybe you have! You just did not recognize them because an ostomy doesn't show. It can be kept secret if you wish. Why, maybe some of your best friends, office associates  or neighbors have an ostomy ... you never can tell.   People facing ostomy surgery have many quality-of-life questions like:  Will you bulge? Smell? Make noises? Will you feel waste leaving your body? Will you be a captive of the toilet? Will you starve? Be a social outcast? Get/stay married? Have babies? Easily bathe, go swimming, bend over?  OK, lets look at what you can expect:  Will you bulge?  Remember, without part of the intestine or bladder, and its contents, you should have a flatter tummy than before. You can expect to wear, with little exception, what you wore before surgery ... and this in-cludes tight clothing and bathing suits.  Will you smell?  Today, thanks to modern odor proof pouching systems, you can walk into an ostomy support group meeting and not smell anything that is foul or offensive. And, for those with an ileostomy or colostomy who are concerned about odor when emptying their pouch, there are in-pouch deodorants that can be used to eliminate any waste odors that may exist.  Will you make noises?  Everyone produces gas, especially if they are an air-swallower. But intestinal sounds that occur from time to time are no differ-ent than a gurgling tummy, and quite often your clothing will muffle any sounds.   Will you feel the waste discharges?  For those with a colostomy or ileostomy there might be a slight pressure when waste leaves your body, but understand that the intestines have no nerve endings, so there will be no unpleasant sensations. Those with a urostomy will  probably be unaware of any kidney drainage.  Will you be a captive of the toilet?  Immediately post-op you will spend more time in the bathroom than you will after your body recovers from surgery. Every person is different, but on average those with an ileostomy or urostomy may empty their pouches 4 to 6 times a day; a little  less if you have a colostomy. The average wear time between pouch system changes is 3  to 5 days and the changing process should take less than 30 minutes.  Will I need to be on a special diet? Most people return to their normal diet when they have recovered from surgery. Be sure to chew your food well, eat a well-balanced diet and drink plenty of fluids. If you experience problems with a certain food, wait a couple of weeks and try it again. Will there be odor and noises? Pouching systems are designed to be odor-proof or odor-resistant. There are deodorants that can be used in the pouch. Medications are also available to help reduce odor. Limit gas-producing foods and carbonated beverages. You will experience less gas and fewer noises as you heal from surgery. How much time will it take to care for my ostomy? At first, you may spend a lot of time learning about your ostomy and how to take care of it. As you become more comfortable and skilled at changing the pouching system, it will take very little time to care for it.  Will I be able to return to work? People with ostomies can perform most jobs. As soon as you have healed from surgery, you should be able to return to work. Heavy lifting (more than 10 pounds) may be discouraged.  What about intimacy? Sexual relationships and intimacy are important and fulfilling aspects of your life. They should continue after ostomy surgery. Intimacy-related concerns should be discussed openly between you and your partner.  Can I wear regular clothing? You do not need to wear special clothing. Ostomy pouches are fairly flat and barely noticeable. Elastic undergarments will not hurt the stoma or prevent the ostomy from functioning.  Can I participate in sports? An ostomy should not limit your involvement in sports. Many people with ostomies are runners, skiers, swimmers or participate in other active lifestyles. Talk with your caregiver first before doing heavy physical activity.  Will you starve?  Not if you follow doctors orders at each stage of  your post-op adjustment. There is no such thing as an ostomy diet. Some people with an ostomy will be able to eat and tolerate anything; others may find diffi-culty with some foods. Each person is an individual and must determine, by trial, what is best for them. A good practice for all is to drink plenty of water.  Will you be a social outcast?  Have you met anyone who has an ostomy and is a social outcast? Why should you be the first? Only your attitude and self image will effect how you are treated. No confi-dent person is an Occupational psychologist.   PROFESSIONAL HELP  Resources are available if you need help or have questions about your ostomy.   1. Specially trained nurses called Wound, Ostomy Continence Nurses [facility]) are available for consultation in most major medical centers.  2. Consider getting an ostomy consult with Bethany [name] at [facility] to help troubleshoot stoma pouch fittings and other issues with your ostomy: [phone]  3. The [employer] ([employer]) is a group made up of many  local chapters throughout the [place]. These local groups hold meetings and provide support to prospective and existing ostomates. They sponsor educational events and have qualified visitors to make personal or telephone visits. Contact the UOA for the chapter nearest you and for other educational publications. 4. More detailed information can be found in Colostomy Guide, a publication of the [employer] (UOA). Contact UOA at 1-601-191-7393 or visit their web site at [email or web address]/. The website contains links to other sites, suppliers and resources. Document Released: 01/29/2003 Document Revised: 04/20/2011 Document Reviewed: 05/30/2008 [facility] Patient Information 2013 [place].  ABDOMINAL SURGERY: POST OP INSTRUCTIONS  1. DIET: Follow a light bland diet the first 24 hours after arrival home, such as soup, liquids, crackers, etc.  Be sure to include lots of fluids  daily.  Avoid fast food or heavy meals as your are more likely to get nauseated.  Eat a low fat the next few days after surgery.   2. Take your usually prescribed home medications unless otherwise directed. 3. PAIN CONTROL: a. Pain is best controlled by a usual combination of three different methods TOGETHER: i. Ice/Heat ii. Over the counter pain medication iii. Prescription pain medication b. Most patients will experience some swelling and bruising around the incisions.  Ice packs or heating pads (30-60 minutes up to 6 times a day) will help. Use ice for the first few days to help decrease swelling and bruising, then switch to heat to help relax tight/sore spots and speed recovery.  Some people prefer to use ice alone, heat alone, alternating between ice & heat.  Experiment to what works for you.  Swelling and bruising can take several weeks to resolve.   c. It is helpful to take an over-the-counter pain medication regularly for the first few weeks.  Choose one of the following that works best for you: i. Naproxen (Aleve, etc)  Two 220mg  tabs twice a day ii. Ibuprofen (Advil, etc) Three 200mg  tabs four times a day (every meal & bedtime) iii. Acetaminophen (Tylenol, etc) 500-650mg  four times a day (every meal & bedtime) d. A  prescription for pain medication (such as oxycodone, hydrocodone, etc) should be given to you upon discharge.  Take your pain medication as prescribed.  i. If you are having problems/concerns with the prescription medicine (does not control pain, nausea, vomiting, rash, itching, etc), please call us [phone] to see if we need to switch you to a different pain medicine that will work better for you and/or control your side effect better. ii. If you need a refill on your pain medication, please contact your pharmacy.  They will contact our office to request authorization. Prescriptions will not be filled after 5 pm or on week-ends. 4. Avoid getting constipated.  Between the  surgery and the pain medications, it is common to experience some constipation.  Increasing fluid intake and taking a fiber supplement (such as Metamucil, Citrucel, FiberCon, MiraLax, etc) 1-2 times a day regularly will usually help prevent this problem from occurring.  A mild laxative (prune juice, Milk of Magnesia, MiraLax, etc) should be taken according to package directions if there are no bowel movements after 48 hours.   5. Watch out for diarrhea.  If you have many loose bowel movements, simplify your diet to bland foods & liquids for a few days.  Stop any stool softeners and decrease your fiber supplement.  Switching to mild anti-diarrheal medications (Kayopectate, Pepto Bismol) can help.  If this worsens or does not improve, please  call us. 6. Wash / shower every day.  You may shower over the incision / wound.  Avoid baths until the skin is fully healed.  Continue to shower over incision(s) after the dressing is off. 7. Remove your waterproof bandages 5 days after surgery.  You may leave the incision open to air.  Remove any wicks or ribbons in your wound.  If you have an open wound, please see wound care instructions. You may replace a dressing/Band-Aid to cover the incision for comfort if you wish. 8. ACTIVITIES as tolerated:   a. You may resume regular (light) daily activities beginning the next day--such as daily self-care, walking, climbing stairs--gradually increasing activities as tolerated.  If you can walk 30 minutes without difficulty, it is safe to try more intense activity such as jogging, treadmill, bicycling, low-impact aerobics, swimming, etc. b. Save the most intensive and strenuous activity for last such as sit-ups, heavy lifting, contact sports, etc  Refrain from any heavy lifting or straining until you are off narcotics for pain control.   c. DO NOT PUSH THROUGH PAIN.  Let pain be your guide: If it hurts to do something, don't do it.  Pain is your body warning you to avoid that  activity for another week until the pain goes down. d. You may drive when you are no longer taking prescription pain medication, you can comfortably wear a seatbelt, and you can safely maneuver your car and apply brakes. e. Dennis Bast may have sexual intercourse when it is comfortable.  9. FOLLOW UP in our office a. Please call [facility] at [phone] to set up an appointment to see your surgeon in the office for a follow-up appointment approximately 1-2 weeks after your surgery. b. Make sure that you call for this appointment the day you arrive home to insure a convenient appointment time. 10. IF YOU HAVE DISABILITY OR FAMILY LEAVE FORMS, BRING THEM TO THE OFFICE FOR PROCESSING.  DO NOT GIVE THEM TO YOUR DOCTOR.   WHEN TO CALL us [phone]: 1. Poor pain control 2. Reactions / problems with new medications (rash/itching, nausea, etc)  3. Fever over 101.5 F (38.5 C) 4. Inability to urinate 5. Nausea and/or vomiting 6. Worsening swelling or bruising 7. Continued bleeding from incision. 8. Increased pain, redness, or drainage from the incision  The clinic staff is available to answer your questions during regular business hours (8:30am-5pm).  Please dont hesitate to call and ask to speak to one of our nurses for clinical concerns.   A surgeon from [facility] is always on call at the hospitals   If you have a medical emergency, go to the nearest emergency room or call 911.    [facility], [place], [place], [place], [place]  [address] ? MAIN: [phone] ? TOLL FREE: [phone] ? FAX [phone] [email or web address]  Exercise to Stay Healthy Exercise helps you become and stay healthy. EXERCISE IDEAS AND TIPS Choose exercises that:  You enjoy.  Fit into your day. You do not need to exercise really hard to be healthy. You can do exercises at a slow or medium level and stay healthy. You can: 5. Stretch before and after working  out. 6. Try yoga, Pilates, or tai chi. 7. Lift weights. 8. Walk fast, swim, jog, run, climb stairs, bicycle, dance, or rollerskate. 9. Take aerobic classes. Exercises that burn about 150 calories:  Running 1  miles in 15 minutes.  Playing volleyball for 45 to 60  minutes.  Washing and waxing a car for 45 to 60 minutes.  Playing touch football for 45 minutes.  Walking 1  miles in 35 minutes.  Pushing a stroller 1  miles in 30 minutes.  Playing basketball for 30 minutes.  Raking leaves for 30 minutes.  Bicycling 5 miles in 30 minutes.  Walking 2 miles in 30 minutes.  Dancing for 30 minutes.  Shoveling snow for 15 minutes.  Swimming laps for 20 minutes.  Walking up stairs for 15 minutes.  Bicycling 4 miles in 15 minutes.  Gardening for 30 to 45 minutes.  Jumping rope for 15 minutes.  Washing windows or floors for 45 to 60 minutes. Document Released: 02/28/2010 Document Revised: 04/20/2011 Document Reviewed: 02/28/2010 [facility] Patient Information 2015 [place], [place]. This information is not intended to replace advice given to you by your health care provider. Make sure you discuss any questions you have with your health care provider.  GETTING TO GOOD BOWEL HEALTH. Irregular bowel habits such as constipation and diarrhea can lead to many problems over time.  Having one soft bowel movement a day is the most important way to prevent further problems.  The anorectal canal is designed to handle stretching and feces to safely manage our ability to get rid of solid waste (feces, poop, stool) out of our body.  BUT, hard constipated stools can act like ripping concrete bricks and diarrhea can be a burning fire to this very sensitive area of our body, causing inflamed hemorrhoids, anal fissures, increasing risk is perirectal abscesses, abdominal pain/bloating, an making irritable bowel worse.     The goal: ONE SOFT BOWEL MOVEMENT A DAY!  To have soft, regular bowel movements:     Drink at least 8 tall glasses of water a day.    Take plenty of fiber.  Fiber is the undigested part of plant food that passes into the colon, acting s natures broom to encourage bowel motility and movement.  Fiber can absorb and hold large amounts of water. This results in a larger, bulkier stool, which is soft and easier to pass. Work gradually over several weeks up to 6 servings a day of fiber (25g a day even more if needed) in the form of: o Vegetables -- Root (potatoes, carrots, turnips), leafy green (lettuce, salad greens, celery, spinach), or cooked high residue (cabbage, broccoli, etc) o Fruit -- Fresh (unpeeled skin & pulp), Dried (prunes, apricots, cherries, etc ),  or stewed ( applesauce)  o Whole grain breads, pasta, etc (whole wheat)  o Bran cereals   Bulking Agents -- This type of water-retaining fiber generally is easily obtained each day by one of the following:  o Psyllium bran -- The psyllium plant is remarkable because its ground seeds can retain so much water. This product is available as Metamucil, Konsyl, Effersyllium, Per Diem Fiber, or the less expensive generic preparation in drug and health food stores. Although labeled a laxative, it really is not a laxative.  o Methylcellulose -- This is another fiber derived from wood which also retains water. It is available as Citrucel. o Polyethylene Glycol - and artificial fiber commonly called Miralax or Glycolax.  It is helpful for people with gassy or bloated feelings with regular fiber o Flax Seed - a less gassy fiber than psyllium  No reading or other relaxing activity while on the toilet. If bowel movements take longer than 5 minutes, you are too constipated  AVOID CONSTIPATION.  High fiber and water intake usually takes care  of this.  Sometimes a laxative is needed to stimulate more frequent bowel movements, but   Laxatives are not a good long-term solution as it can wear the colon out. o Osmotics (Milk of Magnesia,  Fleets phosphosoda, Magnesium citrate, MiraLax, GoLytely) are safer than  o Stimulants (Senokot, Castor Oil, Dulcolax, Ex Lax)    o Do not take laxatives for more than 7days in a row.   IF SEVERELY CONSTIPATED, try a Bowel Retraining Program: o Do not use laxatives.  o Eat a diet high in roughage, such as bran cereals and leafy vegetables.  o Drink six (6) ounces of prune or apricot juice each morning.  o Eat two (2) large servings of stewed fruit each day.  o Take one (1) heaping tablespoon of a psyllium-based bulking agent twice a day. Use sugar-free sweetener when possible to avoid excessive calories.  o Eat a normal breakfast.  o Set aside 15 minutes after breakfast to sit on the toilet, but do not strain to have a bowel movement.  o If you do not have a bowel movement by the third day, use an enema and repeat the above steps.   Controlling diarrhea o Switch to liquids and simpler foods for a few days to avoid stressing your intestines further. o Avoid dairy products (especially milk & ice cream) for a short time.  The intestines often can lose the ability to digest lactose when stressed. o Avoid foods that cause gassiness or bloating.  Typical foods include beans and other legumes, cabbage, broccoli, and dairy foods.  Every person has some sensitivity to other foods, so listen to our body and avoid those foods that trigger problems for you. o Adding fiber (Citrucel, Metamucil, psyllium, Miralax) gradually can help thicken stools by absorbing excess fluid and retrain the intestines to act more normally.  Slowly increase the dose over a few weeks.  Too much fiber too soon can backfire and cause cramping & bloating. o Probiotics (such as active yogurt, Align, etc) may help repopulate the intestines and colon with normal bacteria and calm down a sensitive digestive tract.  Most studies show it to be of mild help, though, and such products can be costly. o Medicines: - Bismuth subsalicylate (ex.  Kayopectate, Pepto Bismol) every 30 minutes for up to 6 doses can help control diarrhea.  Avoid if pregnant. - Loperamide (Immodium) can slow down diarrhea.  Start with two tablets ($RemoveBefo'4mg'WzOKZzsgsVh$  total) first and then try one tablet every 6 hours.  Avoid if you are having fevers or severe pain.  If you are not better or start feeling worse, stop all medicines and call your doctor for advice o Call your doctor if you are getting worse or not better.  Sometimes further testing (cultures, endoscopy, X-ray studies, bloodwork, etc) may be needed to help diagnose and treat the cause of the diarrhea.  Managing Pain  Pain after surgery or related to activity is often due to strain/injury to muscle, tendon, nerves and/or incisions.  This pain is usually short-term and will improve in a few months.   Many people find it helpful to do the following things TOGETHER to help speed the process of healing and to get back to regular activity more quickly:  1. Avoid heavy physical activity at first a. No lifting greater than 20 pounds at first, then increase to lifting as tolerated over the next few weeks b. Do not push through the pain.  Listen to your body and avoid positions and maneuvers  than reproduce the pain.  Wait a few days before trying something more intense c. Walking is okay as tolerated, but go slowly and stop when getting sore.  If you can walk 30 minutes without stopping or pain, you can try more intense activity (running, jogging, aerobics, cycling, swimming, treadmill, sex, sports, weightlifting, etc ) d. Remember: If it hurts to do it, then dont do it!  2. Take Anti-inflammatory medication a. Choose ONE of the following over-the-counter medications: i.            Acetaminophen $RemoveBefore'500mg'EKPbaxDffvhnK$  tabs (Tylenol) 1-2 pills with every meal and just before bedtime (avoid if you have liver problems) b.           Take with food/snack around the clock for 1-2 weeks i. This helps the muscle and nerve tissues become less  irritable and calm down faster  3. Use a Heating pad or Ice/Cold Pack a. 4-6 times a day b. May use warm bath/hottub  or showers  4. Try Gentle Massage and/or Stretching  a. at the area of pain many times a day b. stop if you feel pain - do not overdo it  Try these steps together to help you body heal faster and avoid making things get worse.  Doing just one of these things may not be enough.    If you are not getting better after two weeks or are noticing you are getting worse, contact our office for further advice; we may need to re-evaluate you & see what other things we can do to help.  Exercise to Stay Healthy Exercise helps you become and stay healthy. EXERCISE IDEAS AND TIPS Choose exercises that:  You enjoy.  Fit into your day. You do not need to exercise really hard to be healthy. You can do exercises at a slow or medium level and stay healthy. You can: 10. Stretch before and after working out. 11. Try yoga, Pilates, or tai chi. 12. Lift weights. 13. Walk fast, swim, jog, run, climb stairs, bicycle, dance, or rollerskate. 14. Take aerobic classes. Exercises that burn about 150 calories:  Running 1  miles in 15 minutes.  Playing volleyball for 45 to 60 minutes.  Washing and waxing a car for 45 to 60 minutes.  Playing touch football for 45 minutes.  Walking 1  miles in 35 minutes.  Pushing a stroller 1  miles in 30 minutes.  Playing basketball for 30 minutes.  Raking leaves for 30 minutes.  Bicycling 5 miles in 30 minutes.  Walking 2 miles in 30 minutes.  Dancing for 30 minutes.  Shoveling snow for 15 minutes.  Swimming laps for 20 minutes.  Walking up stairs for 15 minutes.  Bicycling 4 miles in 15 minutes.  Gardening for 30 to 45 minutes.  Jumping rope for 15 minutes.  Washing windows or floors for 45 to 60 minutes. Document Released: 02/28/2010 Document Revised: 04/20/2011 Document Reviewed: 02/28/2010 [facility] Patient Information 2015  [place], [place]. This information is not intended to replace advice given to you by your health care provider. Make sure you discuss any questions you have with your health care provider.   Urinary Incontinence Urinary incontinence is the involuntary loss of urine from your bladder. CAUSES  There are many causes of urinary incontinence. They include:  Medicines.  Infections.  Prostatic enlargement, leading to overflow of urine from your bladder.  Surgery.  Neurological diseases.  Emotional factors. SIGNS AND SYMPTOMS Urinary Incontinence can be divided into four types: 15. Urge incontinence.  Urge incontinence is the involuntary loss of urine before you have the opportunity to go to the bathroom. There is a sudden urge to void but not enough time to reach a bathroom. 16. Stress incontinence. Stress incontinence is the sudden loss of urine with any activity that forces urine to pass. It is commonly caused by anatomical changes to the pelvis and sphincter areas of your body. 17. Overflow incontinence. Overflow incontinence is the loss of urine from an obstructed opening to your bladder. This results in a backup of urine and a resultant buildup of pressure within the bladder. When the pressure within the bladder exceeds the closing pressure of the sphincter, the urine overflows, which causes incontinence, similar to water overflowing a dam. 18. Total incontinence. Total incontinence is the loss of urine as a result of the inability to store urine within your bladder. DIAGNOSIS  Evaluating the cause of incontinence may require:  A thorough and complete medical and obstetric history.  A complete physical exam.  Laboratory tests such as a urine culture and sensitivities. When additional tests are indicated, they can include:  An ultrasound exam.  Kidney and bladder X-rays.  Cystoscopy. This is an exam of the bladder using a narrow scope.  Urodynamic testing to test the nerve function  to the bladder and sphincter areas. TREATMENT  Treatment for urinary incontinence depends on the cause:  For urge incontinence caused by a bacterial infection, antibiotics will be prescribed. If the urge incontinence is related to medicines you take, your health care provider may have you change the medicine.  For stress incontinence, surgery to re-establish anatomical support to the bladder or sphincter, or both, will often correct the condition.  For overflow incontinence caused by an enlarged prostate, an operation to open the channel through the enlarged prostate will allow the flow of urine out of the bladder. In women with fibroids, a hysterectomy may be recommended.  For total incontinence, surgery on your urinary sphincter may help. An artificial urinary sphincter (an inflatable cuff placed around the urethra) may be required. In women who have developed a hole-like passage between their bladder and vagina (vesicovaginal fistula), surgery to close the fistula often is required. HOME CARE INSTRUCTIONS  Normal daily hygiene and the use of pads or adult diapers that are changed regularly will help prevent odors and skin damage.  Avoid caffeine. It can overstimulate your bladder.  Use the bathroom regularly. Try about every 2-3 hours to go to the bathroom, even if you do not feel the need to do so. Take time to empty your bladder completely. After urinating, wait a minute. Then try to urinate again.  For causes involving nerve dysfunction, keep a log of the medicines you take and a journal of the times you go to the bathroom. SEEK MEDICAL CARE IF:  You experience worsening of pain instead of improvement in pain after your procedure.  Your incontinence becomes worse instead of better. SEE IMMEDIATE MEDICAL CARE IF:  You experience fever or shaking chills.  You are unable to pass your urine.  You have redness spreading into your groin or down into your thighs. MAKE SURE YOU:    Understand these instructions.   Will watch your condition.  Will get help right away if you are not doing well or get worse. Document Released: 03/05/2004 Document Revised: 11/16/2012 Document Reviewed: 07/05/2012 [facility] Patient Information 2015 [place], [place]. This information is not intended to replace advice given to you by your health care provider. Make sure  you discuss any questions you have with your health care provider.

## 2014-05-28 ENCOUNTER — Encounter (HOSPITAL_COMMUNITY): Payer: Self-pay

## 2014-06-27 ENCOUNTER — Telehealth: Payer: Self-pay | Admitting: Oncology

## 2014-06-27 ENCOUNTER — Ambulatory Visit (HOSPITAL_BASED_OUTPATIENT_CLINIC_OR_DEPARTMENT_OTHER): Payer: Medicare Other | Admitting: Oncology

## 2014-06-27 VITALS — BP 133/55 | HR 67 | Temp 97.9°F | Resp 18

## 2014-06-27 DIAGNOSIS — K769 Liver disease, unspecified: Secondary | ICD-10-CM

## 2014-06-27 DIAGNOSIS — R531 Weakness: Secondary | ICD-10-CM

## 2014-06-27 DIAGNOSIS — Z86711 Personal history of pulmonary embolism: Secondary | ICD-10-CM | POA: Diagnosis not present

## 2014-06-27 DIAGNOSIS — C20 Malignant neoplasm of rectum: Secondary | ICD-10-CM | POA: Diagnosis not present

## 2014-06-27 NOTE — Progress Notes (Signed)
Hematology and Oncology Follow Up Visit  Bethany [name] [id number] Jan 11, 1929 81 y.o. 06/27/2014 12:56 PM [name], MDKim, [name], MD   Principle Diagnosis: 81 year old woman with rectal adenocarcinoma diagnosed in August of 2015. Her staging by EUS his T3 N0. MRI of the liver showed potential hepatic metastasis.  Past therapy:  Radiation therapy concomitantly with oral Xeloda at 500 mg twice a day started on 10/25/2013. Therapy concluded in October 2015. She received total dose to 4320 cGy. Therapy interrupted due to recurrent hospitalizations. She is status post robotic-assisted rectosigmoid low anterior resection done on 05/17/2014. The pathology revealed a T3 N1 disease.   Current therapy: Supportive care only.   Interim History:  Bethany [name] presents today for a followup visit. Since the last visit, she underwent surgical resection of her tumor without any complications. She is recovering well at this time and slowly gaining weight. Her mobility is improving still uses a walker at this time. She was discharged from rehabilitation and using a walker for mobility. She is getting home physical therapy. She has not reported any back pain or abdominal pain. She has no problems with her ostomy at this time. She does have loose bowel habits at times. She has not had any headaches or blurry vision. She does not report any syncope or seizures. She does not report any chest pain, shortness of breath, cough or hemoptysis. She does not report any palpitation, orthopnea or PND. She does not report any leg edema. She denies abdominal pain, constipation or obstruction. She does not report any frequency urgency or hesitancy. She is not abort any skeletal complaints. Rest of the review of systems unremarkable.     Medications: I have reviewed the patient's current medications.  Current Outpatient Prescriptions  Medication Sig Dispense Refill  . acetaminophen (TYLENOL) 325 MG tablet Take 2 tablets (650 mg  total) by mouth every 6 (six) hours as needed for mild pain (or Fever >/= 101).    . cholecalciferol (VITAMIN D) 1000 UNITS tablet Take 2,000 Units by mouth daily.    . feeding supplement, RESOURCE BREEZE, (RESOURCE BREEZE) LIQD Take 1 Container by mouth 2 (two) times daily between meals.  0  . gabapentin (NEURONTIN) 100 MG capsule Take 1 capsule (100 mg total) by mouth 3 (three) times daily. 90 capsule 6  . levothyroxine (SYNTHROID, LEVOTHROID) 25 MCG tablet Take 25 mcg by mouth See admin instructions. 25 mcg  on Monday, Wednesday, Friday and Sunday.    . levothyroxine (SYNTHROID, LEVOTHROID) 75 MCG tablet Take 1 tablet by mouth See admin instructions. 38mcg on Tuesday, Thursday, Saturday.    . magnesium chloride (SLOW-MAG) 64 MG TBEC SR tablet Take 1 tablet (64 mg total) by mouth daily. 30 tablet 2  . polyethylene glycol (MIRALAX / GLYCOLAX) packet Take 17 g by mouth every 12 (twelve) hours as needed for mild constipation, moderate constipation or severe constipation. 14 each 0  . potassium chloride SA (K-DUR,KLOR-CON) 20 MEQ tablet Take 2 tablets (40 mEq total) by mouth daily. 30 tablet 2  . rivaroxaban (XARELTO) 20 MG TABS tablet Take 1 tablet (20 mg total) by mouth daily with supper. 30 tablet 0  . sertraline (ZOLOFT) 25 MG tablet Take 25 mg by mouth daily.  5  . HYDROcodone-acetaminophen (NORCO/VICODIN) 5-325 MG per tablet Take 1-2 tablets by mouth every 6 (six) hours as needed for moderate pain or severe pain. (Patient not taking: Reported on 06/27/2014) 40 tablet 0   No current facility-administered medications for this visit.  Allergies:  Allergies  Allergen Reactions  . Cataflam [Diclofenac] Other (See Comments)    Unknown; patient is unaware of allergy.  . Cephalosporins Shortness Of Breath  . Erythromycin Shortness Of Breath  . Keflex [Cephalexin] Shortness Of Breath  . Propoxyphene     Unknown rxn  . Codeine Nausea And Vomiting  . Naproxen Other (See Comments)    Unknown;  patient is unaware of allergy  . Penicillins Other (See Comments)    Patient states that she CAN take this; unaware of allergy  . Synalgos-Dc [Aspirin-Caff-Dihydrocodeine] Other (See Comments)    Unknown; patient is unaware of allergy  . Ultram [Tramadol] Other (See Comments)    Unknown; patient is unaware of allergy    Past Medical History, Surgical history, Social history, and Family History were reviewed and updated.   Physical Exam: Blood pressure 133/55, pulse 67, temperature 97.9 F (36.6 C), temperature source Oral, resp. rate 18, SpO2 100 %. ECOG: 2 General appearance: alert and cooperative. Frail appearing but not in any distress. Head: Normocephalic, without obvious abnormality Neck: no adenopathy Lymph nodes: Cervical, supraclavicular, and axillary nodes normal. Heart:regular rate and rhythm, S1, S2 normal, no murmur, click, rub or gallop Lung:chest clear, no wheezing, rales, normal symmetric air entry. Abdomen: soft, non-tender, without masses or organomegaly. Ostomy site appears clean and dry. EXT:no erythema, induration, or nodules.  Neurological: Weakness noted in her left lower extremity more than the right.   Lab Results: Lab Results  Component Value Date   WBC 7.5 05/18/2014   HGB 10.5* 05/23/2014   HCT 31.0* 05/18/2014   MCV 95.1 05/18/2014   PLT 139* 05/18/2014     Chemistry      Component Value Date/Time   NA 141 05/18/2014 0340   NA 140 11/23/2013 1426   K 4.1 05/23/2014 0430   K 4.0 11/23/2013 1426   CL 102 05/18/2014 0340   CO2 33* 05/18/2014 0340   CO2 28 11/23/2013 1426   BUN 14 05/18/2014 0340   BUN 16.5 11/23/2013 1426   CREATININE 0.52 05/22/2014 0558   CREATININE 0.7 11/23/2013 1426      Component Value Date/Time   CALCIUM 8.4 05/18/2014 0340   CALCIUM 9.6 11/23/2013 1426   ALKPHOS 62 01/23/2014 0511   ALKPHOS 110 11/23/2013 1426   AST 15 01/23/2014 0511   AST 32 11/23/2013 1426   ALT 10 01/23/2014 0511   ALT 52 11/23/2013 1426    BILITOT 0.7 01/23/2014 0511   BILITOT 1.01 11/23/2013 1426         Impression and Plan:  81 year old woman with the following issues:   1. Rectal cancer presented with hematochezia and status post colonoscopy and EUS. The care of by EUS criteria was 4 cm in length around 4-5 cm from the anal verge. The staging was T3 N0 M1 with possible liver metastasis. She is status post radiation therapy with oral Xeloda with still residual tumor as evident by the PET scan results.   She is status post surgical resection with a pathology revealed invasive adenocarcinoma through the muscularis propria. There are 2 extramural satellite tumor nodules. 12 lymph nodes were negative for metastatic cancer.  She is not a candidate for adjuvant chemotherapy especially at this time given her frail status. I plan on repeat imaging studies in 2 months and assess her cancer status at that time.  2. 19 mm lesion in the right hepatic lobe. This could represent metastatic disease from her rectal cancer. She will have a repeat  PET scan in July 2016. We can consider liver directed therapy at that time if she still have any residual tumor.  3. History of pulmonary embolism: She is on full dose anticoagulation with Xarelto. She needs to be on lifetime anticoagulation as she developed another blood clot off of it.  4. Left lower extremity weakness and possible neuropathy: Seems to be improving with physical therapy.  5. Follow-up: Will be to 2 months after repeat PET scan.  [facility], MD 5/18/201612:56 PM

## 2014-06-27 NOTE — Telephone Encounter (Signed)
Pt confirmed labs/ov per 05/18 POF, gave pt AVS and Calendar.... KJ

## 2014-08-06 ENCOUNTER — Other Ambulatory Visit: Payer: Self-pay

## 2014-09-04 ENCOUNTER — Other Ambulatory Visit (HOSPITAL_BASED_OUTPATIENT_CLINIC_OR_DEPARTMENT_OTHER): Payer: Medicare Other

## 2014-09-04 DIAGNOSIS — C20 Malignant neoplasm of rectum: Secondary | ICD-10-CM

## 2014-09-04 LAB — COMPREHENSIVE METABOLIC PANEL (CC13)
ALBUMIN: 3.7 g/dL (ref 3.5–5.0)
ALT: 25 U/L (ref 0–55)
AST: 29 U/L (ref 5–34)
Alkaline Phosphatase: 108 U/L (ref 40–150)
Anion Gap: 7 mEq/L (ref 3–11)
BUN: 25 mg/dL (ref 7.0–26.0)
CHLORIDE: 104 meq/L (ref 98–109)
CO2: 30 meq/L — AB (ref 22–29)
Calcium: 9.7 mg/dL (ref 8.4–10.4)
Creatinine: 0.7 mg/dL (ref 0.6–1.1)
EGFR: 81 mL/min/{1.73_m2} — AB (ref >90)
GLUCOSE: 92 mg/dL (ref 70–140)
POTASSIUM: 4.6 meq/L (ref 3.5–5.1)
Sodium: 141 mEq/L (ref 136–145)
TOTAL PROTEIN: 6.4 g/dL (ref 6.4–8.3)
Total Bilirubin: 0.46 mg/dL (ref 0.20–1.20)

## 2014-09-04 LAB — CBC WITH DIFFERENTIAL/PLATELET
BASO%: 0.4 % (ref 0.0–2.0)
BASOS ABS: 0 10*3/uL (ref 0.0–0.1)
EOS%: 2 % (ref 0.0–7.0)
Eosinophils Absolute: 0.1 10*3/uL (ref 0.0–0.5)
HEMATOCRIT: 38.6 % (ref 34.8–46.6)
HEMOGLOBIN: 12.9 g/dL (ref 11.6–15.9)
LYMPH%: 6.9 % — ABNORMAL LOW (ref 14.0–49.7)
MCH: 31.5 pg (ref 25.1–34.0)
MCHC: 33.4 g/dL (ref 31.5–36.0)
MCV: 94.1 fL (ref 79.5–101.0)
MONO#: 0.3 10*3/uL (ref 0.1–0.9)
MONO%: 5.9 % (ref 0.0–14.0)
NEUT#: 4.2 10*3/uL (ref 1.5–6.5)
NEUT%: 84.8 % — ABNORMAL HIGH (ref 38.4–76.8)
Platelets: 146 10*3/uL (ref 145–400)
RBC: 4.1 10*6/uL (ref 3.70–5.45)
RDW: 12.7 % (ref 11.2–14.5)
WBC: 4.9 10*3/uL (ref 3.9–10.3)
lymph#: 0.3 10*3/uL — ABNORMAL LOW (ref 0.9–3.3)

## 2014-09-04 LAB — CEA: CEA: 5.8 ng/mL — ABNORMAL HIGH (ref 0.0–5.0)

## 2014-09-05 ENCOUNTER — Ambulatory Visit (HOSPITAL_COMMUNITY)
Admission: RE | Admit: 2014-09-05 | Discharge: 2014-09-05 | Disposition: A | Discharge status: Home / Self Care | Payer: Medicare Other | Source: Ambulatory Visit | Attending: Oncology | Admitting: Oncology

## 2014-09-05 ENCOUNTER — Other Ambulatory Visit: Payer: Self-pay | Admitting: Oncology

## 2014-09-05 ENCOUNTER — Encounter (HOSPITAL_COMMUNITY): Payer: Self-pay

## 2014-09-05 DIAGNOSIS — R911 Solitary pulmonary nodule: Secondary | ICD-10-CM | POA: Diagnosis not present

## 2014-09-05 DIAGNOSIS — C20 Malignant neoplasm of rectum: Secondary | ICD-10-CM

## 2014-09-05 DIAGNOSIS — C787 Secondary malignant neoplasm of liver and intrahepatic bile duct: Secondary | ICD-10-CM | POA: Diagnosis not present

## 2014-09-05 DIAGNOSIS — Z933 Colostomy status: Secondary | ICD-10-CM | POA: Diagnosis not present

## 2014-09-05 LAB — GLUCOSE, CAPILLARY: GLUCOSE-CAPILLARY: 88 mg/dL (ref 65–99)

## 2014-09-05 MED ORDER — FLUDEOXYGLUCOSE F - 18 (FDG) INJECTION
6.9000 | Freq: Once | INTRAVENOUS | Status: AC | PRN
  Administered 2014-09-05: 6.9 via INTRAVENOUS

## 2014-09-06 ENCOUNTER — Ambulatory Visit (HOSPITAL_BASED_OUTPATIENT_CLINIC_OR_DEPARTMENT_OTHER): Payer: Medicare Other | Admitting: Oncology

## 2014-09-06 ENCOUNTER — Telehealth: Payer: Self-pay | Admitting: Oncology

## 2014-09-06 VITALS — BP 147/53 | HR 58 | Temp 97.6°F | Resp 18 | Ht 66.0 in | Wt 120.5 lb

## 2014-09-06 DIAGNOSIS — K769 Liver disease, unspecified: Secondary | ICD-10-CM | POA: Diagnosis not present

## 2014-09-06 DIAGNOSIS — R531 Weakness: Secondary | ICD-10-CM | POA: Diagnosis not present

## 2014-09-06 DIAGNOSIS — Z86711 Personal history of pulmonary embolism: Secondary | ICD-10-CM

## 2014-09-06 DIAGNOSIS — C20 Malignant neoplasm of rectum: Secondary | ICD-10-CM | POA: Diagnosis not present

## 2014-09-06 DIAGNOSIS — Z7901 Long term (current) use of anticoagulants: Secondary | ICD-10-CM

## 2014-09-06 NOTE — Progress Notes (Signed)
Hematology and Oncology Follow Up Visit  Bethany [name] [id number] 1928-08-06 81 y.o. 09/06/2014 11:55 AM Bethany [name], MDKim, [name], MD   Principle Diagnosis: 81 year old woman with rectal adenocarcinoma diagnosed in August of 2015. Her staging by EUS his T3 N0. MRI of the liver showed potential hepatic metastasis.  Past therapy:  Radiation therapy concomitantly with oral Xeloda at 500 mg twice a day started on 10/25/2013. Therapy concluded in October 2015. She received total dose to 4320 cGy. Therapy interrupted due to recurrent hospitalizations. She is status post robotic-assisted rectosigmoid low anterior resection done on 05/17/2014. The pathology revealed a T3 N1 disease. PET/CT scan in July 2016 showed progresses metastatic disease in the liver and pulmonary nodules.   Current therapy: Supportive care only.   Interim History:  Bethany [name] presents today for a followup visit. Since the last visit, she continues recovering well  and slowly gaining weight. Her mobility is improving still uses a walker at this time. She has not reported any falls or syncope.She has not reported any back pain or abdominal pain. She has no problems with her ostomy at this time. She does have loose bowel habits at times. Her performance status although poor have not dramatically declined as a matter fact have improved since her operation. She has not had any headaches or blurry vision. She does not report any syncope or seizures. She does not report any chest pain, shortness of breath, cough or hemoptysis. She does not report any palpitation, orthopnea or PND. She does not report any leg edema. She denies abdominal pain, constipation or obstruction. She does not report any frequency urgency or hesitancy. She is not abort any skeletal complaints. Rest of the review of systems unremarkable.     Medications: I have reviewed the patient's current medications.  Current Outpatient Prescriptions  Medication Sig Dispense  Refill  . acetaminophen (TYLENOL) 325 MG tablet Take 2 tablets (650 mg total) by mouth every 6 (six) hours as needed for mild pain (or Fever >/= 101).    . cholecalciferol (VITAMIN D) 1000 UNITS tablet Take 2,000 Units by mouth daily.    . feeding supplement, RESOURCE BREEZE, (RESOURCE BREEZE) LIQD Take 1 Container by mouth 2 (two) times daily between meals.  0  . gabapentin (NEURONTIN) 100 MG capsule Take 1 capsule (100 mg total) by mouth 3 (three) times daily. 90 capsule 6  . levothyroxine (SYNTHROID, LEVOTHROID) 25 MCG tablet Take 25 mcg by mouth See admin instructions. 25 mcg  on Monday, Wednesday, Friday and Sunday.    . levothyroxine (SYNTHROID, LEVOTHROID) 75 MCG tablet Take 1 tablet by mouth See admin instructions. 58mg on Tuesday, Thursday, Saturday.    . magnesium chloride (SLOW-MAG) 64 MG TBEC SR tablet Take 1 tablet (64 mg total) by mouth daily. 30 tablet 2  . polyethylene glycol (MIRALAX / GLYCOLAX) packet Take 17 g by mouth every 12 (twelve) hours as needed for mild constipation, moderate constipation or severe constipation. 14 each 0  . potassium chloride SA (K-DUR,KLOR-CON) 20 MEQ tablet Take 2 tablets (40 mEq total) by mouth daily. 30 tablet 2  . rivaroxaban (XARELTO) 20 MG TABS tablet Take 1 tablet (20 mg total) by mouth daily with supper. 30 tablet 0  . sertraline (ZOLOFT) 25 MG tablet Take 25 mg by mouth daily.  5   No current facility-administered medications for this visit.     Allergies:  Allergies  Allergen Reactions  . Cataflam [Diclofenac] Other (See Comments)    Unknown; patient is unaware  of allergy.  . Cephalosporins Shortness Of Breath  . Erythromycin Shortness Of Breath  . Keflex [Cephalexin] Shortness Of Breath  . Propoxyphene     Unknown rxn  . Codeine Nausea And Vomiting  . Naproxen Other (See Comments)    Unknown; patient is unaware of allergy  . Penicillins Other (See Comments)    Patient states that she CAN take this; unaware of allergy  .  Synalgos-Dc [Aspirin-Caff-Dihydrocodeine] Other (See Comments)    Unknown; patient is unaware of allergy  . Ultram [Tramadol] Other (See Comments)    Unknown; patient is unaware of allergy    Past Medical History, Surgical history, Social history, and Family History were reviewed and updated.   Physical Exam: Blood pressure 147/53, pulse 58, temperature 97.6 F (36.4 C), temperature source Oral, resp. rate 18, height 5' 6"  (1.676 m), weight 120 lb 8 oz (54.658 kg), SpO2 100 %. ECOG: 2 General appearance: alert and cooperative. Frail woman not in any distress. Head: Normocephalic, without obvious abnormality Neck: no adenopathy Lymph nodes: Cervical, supraclavicular, and axillary nodes normal. Heart:regular rate and rhythm, S1, S2 normal, no murmur, click, rub or gallop Lung:chest clear, no wheezing, rales, normal symmetric air entry. Abdomen: soft, non-tender, without masses or organomegaly. Ostomy site appears clean and dry. EXT:no erythema, induration, or nodules.  Neurological: No deficits.   Lab Results: Lab Results  Component Value Date   WBC 4.9 09/04/2014   HGB 12.9 09/04/2014   HCT 38.6 09/04/2014   MCV 94.1 09/04/2014   PLT 146 09/04/2014     Chemistry      Component Value Date/Time   NA 141 09/04/2014 0843   NA 141 05/18/2014 0340   K 4.6 09/04/2014 0843   K 4.1 05/23/2014 0430   CL 102 05/18/2014 0340   CO2 30* 09/04/2014 0843   CO2 33* 05/18/2014 0340   BUN 25.0 09/04/2014 0843   BUN 14 05/18/2014 0340   CREATININE 0.7 09/04/2014 0843   CREATININE 0.52 05/22/2014 0558      Component Value Date/Time   CALCIUM 9.7 09/04/2014 0843   CALCIUM 8.4 05/18/2014 0340   ALKPHOS 108 09/04/2014 0843   ALKPHOS 62 01/23/2014 0511   AST 29 09/04/2014 0843   AST 15 01/23/2014 0511   ALT 25 09/04/2014 0843   ALT 10 01/23/2014 0511   BILITOT 0.46 09/04/2014 0843   BILITOT 0.7 01/23/2014 0511     EXAM: NUCLEAR MEDICINE PET SKULL BASE TO THIGH  TECHNIQUE: 6.9  mCi F-18 FDG was injected intravenously. Full-ring PET imaging was performed from the skull base to thigh after the radiotracer. CT data was obtained and used for attenuation correction and anatomic localization.  FASTING BLOOD GLUCOSE: Value: 88 mg/dl  COMPARISON: PET-CT 03/02/2014, CT 04/04/2014  FINDINGS: NECK  No hypermetabolic lymph nodes in the neck.  CHEST  No hypermetabolic mediastinal adenopathy. There is a new pulmonary nodule the RIGHT lower lobe medially at the esophageal recess measuring 5 mm image 46, series 9. Nodule has \faint metabolic activity.  ABDOMEN/PELVIS  There is interval enlargement of the hepatic metastasis the central RIGHT hepatic lobe measuring 5 cm compared to 1.5 cm on comparison exam. Lesion is intensely hypermetabolic with SUV max equal 8.3. Three small additional hypermetabolic foci the liver parenchyma consistent with metastasis. One lesion LEFT hepatic lobe on image 110 and 2 lesions in the RIGHT hepatic lobe on image 105 and 112.  No hypermetabolic abdominal adenopathy. No abnormal metabolic activity associated rectal surgical site. LEFT lower quadrant colostomy  SKELETON  No hypermetabolic skeletal metastasis  IMPRESSION: 1. Progression of hepatic metastasis with significant enlargement of the previously described central lesion and three new lesions. 2. New small pulmonary nodule in the RIGHT lower lobe is concerning for pulmonary metastasis.     Impression and Plan:  81 year old woman with the following issues:   1. Rectal cancer presented with hematochezia and status post colonoscopy and EUS. The care of by EUS criteria was 4 cm in length around 4-5 cm from the anal verge. The staging was T3 N0 M1 with possible liver metastasis. She is status post radiation therapy with oral Xeloda with still residual tumor as evident by the PET scan results.   She is status post surgical resection with a pathology revealed  invasive adenocarcinoma through the muscularis propria. There are 2 extramural satellite tumor nodules. 12 lymph nodes were negative for metastatic cancer.  PET CT scan on 09/05/2014 was reviewed today with the patient and his son. He clearly has developed progressive hepatic metastasis and possibly lung metastasis. Options of treatment were discussed today with the patient and her son. I do not think she is a great candidate for multi-agent systemic chemotherapy. It would be reasonable to consider oral Xeloda with a biological agent. That will be in the form of a Avastin or cetuximab if she is K-ras wild-type. Risks and benefits of this approach was discussed in detail with the patient and she is very leery about receiving further therapy. She is afraid of going back to a nursing home and developing C. difficile. I plan to her that the risk is certainly there given her age and frail status.  Alternatively, supportive care only would be a reasonable option given her clinical status. He is asymptomatic from her cancer and her cancer is not curable in any case. She have elected to proceed with observation and surveillance only.  2. Hepatic masses. This could represent metastatic disease from her rectal cancer. I do not think a liver directed therapy is an option given the systemic nature of her cancer.  3. History of pulmonary embolism: She is on full dose anticoagulation with Xarelto. She needs to be on lifetime anticoagulation as she developed another blood clot off of it.  4. Left lower extremity weakness and possible neuropathy: Seems to be improving with physical therapy.  5. Follow-up: Will be to 2 months for a clinical visit.  [facility], MD 7/28/201611:55 AM

## 2014-09-06 NOTE — Telephone Encounter (Signed)
Gave an printed appt sched and avs for pt for Sept

## 2014-10-25 ENCOUNTER — Other Ambulatory Visit (HOSPITAL_BASED_OUTPATIENT_CLINIC_OR_DEPARTMENT_OTHER): Payer: Medicare Other

## 2014-10-25 ENCOUNTER — Telehealth: Payer: Self-pay | Admitting: Oncology

## 2014-10-25 ENCOUNTER — Ambulatory Visit (HOSPITAL_BASED_OUTPATIENT_CLINIC_OR_DEPARTMENT_OTHER): Payer: Medicare Other | Admitting: Oncology

## 2014-10-25 VITALS — BP 172/58 | HR 60 | Temp 97.6°F | Resp 17 | Ht 66.0 in | Wt 123.0 lb

## 2014-10-25 DIAGNOSIS — C787 Secondary malignant neoplasm of liver and intrahepatic bile duct: Secondary | ICD-10-CM | POA: Diagnosis not present

## 2014-10-25 DIAGNOSIS — C20 Malignant neoplasm of rectum: Secondary | ICD-10-CM

## 2014-10-25 LAB — COMPREHENSIVE METABOLIC PANEL (CC13)
ALBUMIN: 3.8 g/dL (ref 3.5–5.0)
ALK PHOS: 106 U/L (ref 40–150)
ALT: 19 U/L (ref 0–55)
ANION GAP: 5 meq/L (ref 3–11)
AST: 23 U/L (ref 5–34)
BUN: 23.2 mg/dL (ref 7.0–26.0)
CALCIUM: 9.9 mg/dL (ref 8.4–10.4)
CO2: 33 mEq/L — ABNORMAL HIGH (ref 22–29)
CREATININE: 0.7 mg/dL (ref 0.6–1.1)
Chloride: 103 mEq/L (ref 98–109)
EGFR: 81 mL/min/{1.73_m2} — ABNORMAL LOW (ref >90)
Glucose: 87 mg/dl (ref 70–140)
Potassium: 4.5 mEq/L (ref 3.5–5.1)
Sodium: 141 mEq/L (ref 136–145)
TOTAL PROTEIN: 6.4 g/dL (ref 6.4–8.3)
Total Bilirubin: 0.85 mg/dL (ref 0.20–1.20)

## 2014-10-25 LAB — CBC WITH DIFFERENTIAL/PLATELET
BASO%: 0.8 % (ref 0.0–2.0)
Basophils Absolute: 0 10*3/uL (ref 0.0–0.1)
EOS%: 4.2 % (ref 0.0–7.0)
Eosinophils Absolute: 0.2 10*3/uL (ref 0.0–0.5)
HEMATOCRIT: 38.1 % (ref 34.8–46.6)
HGB: 12.5 g/dL (ref 11.6–15.9)
LYMPH%: 13.6 % — ABNORMAL LOW (ref 14.0–49.7)
MCH: 30.9 pg (ref 25.1–34.0)
MCHC: 32.8 g/dL (ref 31.5–36.0)
MCV: 94.3 fL (ref 79.5–101.0)
MONO#: 0.3 10*3/uL (ref 0.1–0.9)
MONO%: 7.8 % (ref 0.0–14.0)
NEUT%: 73.6 % (ref 38.4–76.8)
NEUTROS ABS: 2.7 10*3/uL (ref 1.5–6.5)
PLATELETS: 131 10*3/uL — AB (ref 145–400)
RBC: 4.04 10*6/uL (ref 3.70–5.45)
RDW: 12.8 % (ref 11.2–14.5)
WBC: 3.6 10*3/uL — ABNORMAL LOW (ref 3.9–10.3)
lymph#: 0.5 10*3/uL — ABNORMAL LOW (ref 0.9–3.3)

## 2014-10-25 MED ORDER — CAPECITABINE 500 MG PO TABS: ORAL_TABLET | ORAL | Status: DC

## 2014-10-25 NOTE — Progress Notes (Signed)
Hematology and Oncology Follow Up Visit  Bethany [name] [id number] [birth year] 81 y.o. 10/25/2014 1:39 PM [name], MDKim, [name], MD   Principle Diagnosis: 81 year old woman with rectal adenocarcinoma diagnosed in August of 2015. Her staging by EUS his T3 N0. MRI of the liver showed potential hepatic metastasis.  Past therapy:  Radiation therapy concomitantly with oral Xeloda at 500 mg twice a day started on 10/25/2013. Therapy concluded in October 2015. She received total dose to 4320 cGy. Therapy interrupted due to recurrent hospitalizations. She is status post robotic-assisted rectosigmoid low anterior resection done on 05/17/2014. The pathology revealed a T3 N1 disease. PET/CT scan in July 2016 showed progresses metastatic disease in the liver and pulmonary nodules.   Current therapy: Under consideration for systemic therapy.  Interim History:  Ms. Holzheimer presents today for a followup visit. Since the last visit, she continues to improve slowly gaining weight. Her mobility is improving still uses a walker at this time. She has not reported any falls or syncope.She has not reported any back pain or abdominal pain. She has no problems with her ostomy at this time. She does have loose bowel habits at times. Her performance status is actually improving. She is able to prepare meals and do very little light chores around the house.  She has not had any headaches or blurry vision. She does not report any syncope or seizures. She does not report any chest pain, shortness of breath, cough or hemoptysis. She does not report any palpitation, orthopnea or PND. She does not report any leg edema. She denies abdominal pain, constipation or obstruction. She does not report any frequency urgency or hesitancy. She is not abort any skeletal complaints. Rest of the review of systems unremarkable.     Medications: I have reviewed the patient's current medications.  Current Outpatient Prescriptions  Medication  Sig Dispense Refill  . acetaminophen (TYLENOL) 325 MG tablet Take 2 tablets (650 mg total) by mouth every 6 (six) hours as needed for mild pain (or Fever >/= 101).    . capecitabine (XELODA) 500 MG tablet Take one tablet twice a day for 14 days then 7 days off. 28 tablet 1  . cholecalciferol (VITAMIN D) 1000 UNITS tablet Take 2,000 Units by mouth daily.    . feeding supplement, RESOURCE BREEZE, (RESOURCE BREEZE) LIQD Take 1 Container by mouth 2 (two) times daily between meals.  0  . gabapentin (NEURONTIN) 100 MG capsule Take 1 capsule (100 mg total) by mouth 3 (three) times daily. 90 capsule 6  . levothyroxine (SYNTHROID, LEVOTHROID) 25 MCG tablet Take 25 mcg by mouth See admin instructions. 25 mcg  on Monday, Wednesday, Friday and Sunday.    . levothyroxine (SYNTHROID, LEVOTHROID) 75 MCG tablet Take 1 tablet by mouth See admin instructions. 105mcg on Tuesday, Thursday, Saturday.    . magnesium chloride (SLOW-MAG) 64 MG TBEC SR tablet Take 1 tablet (64 mg total) by mouth daily. 30 tablet 2  . polyethylene glycol (MIRALAX / GLYCOLAX) packet Take 17 g by mouth every 12 (twelve) hours as needed for mild constipation, moderate constipation or severe constipation. 14 each 0  . potassium chloride SA (K-DUR,KLOR-CON) 20 MEQ tablet Take 2 tablets (40 mEq total) by mouth daily. 30 tablet 2  . rivaroxaban (XARELTO) 20 MG TABS tablet Take 1 tablet (20 mg total) by mouth daily with supper. 30 tablet 0  . sertraline (ZOLOFT) 25 MG tablet Take 25 mg by mouth daily.  5   No current facility-administered medications for  this visit.     Allergies:  Allergies  Allergen Reactions  . Cataflam [Diclofenac] Other (See Comments)    Unknown; patient is unaware of allergy.  . Cephalosporins Shortness Of Breath  . Erythromycin Shortness Of Breath  . Keflex [Cephalexin] Shortness Of Breath  . Propoxyphene     Unknown rxn  . Codeine Nausea And Vomiting  . Naproxen Other (See Comments)    Unknown; patient is unaware  of allergy  . Penicillins Other (See Comments)    Patient states that she CAN take this; unaware of allergy  . Synalgos-Dc [Aspirin-Caff-Dihydrocodeine] Other (See Comments)    Unknown; patient is unaware of allergy  . Ultram [Tramadol] Other (See Comments)    Unknown; patient is unaware of allergy    Past Medical History, Surgical history, Social history, and Family History were reviewed and updated.   Physical Exam: Blood pressure 172/58, pulse 60, temperature 97.6 F (36.4 C), temperature source Oral, resp. rate 17, height 5\' 6"  (1.676 m), weight 123 lb (55.792 kg), SpO2 100 %. ECOG: 2 General appearance: Alert, awake frail woman without distress. Head: Normocephalic, without obvious abnormality Neck: no adenopathy Lymph nodes: Cervical, supraclavicular, and axillary nodes normal. Heart:regular rate and rhythm, S1, S2 normal, no murmur, click, rub or gallop Lung:chest clear, no wheezing, rales, normal symmetric air entry. Abdomen: soft, non-tender, without masses or organomegaly. Ostomy site appears clean and dry. EXT:no erythema, induration, or nodules.  Neurological: No deficits.   Lab Results: Lab Results  Component Value Date   WBC 3.6* 10/25/2014   HGB 12.5 10/25/2014   HCT 38.1 10/25/2014   MCV 94.3 10/25/2014   PLT 131* 10/25/2014     Chemistry      Component Value Date/Time   NA 141 10/25/2014 1251   NA 141 05/18/2014 0340   K 4.5 10/25/2014 1251   K 4.1 05/23/2014 0430   CL 102 05/18/2014 0340   CO2 33* 10/25/2014 1251   CO2 33* 05/18/2014 0340   BUN 23.2 10/25/2014 1251   BUN 14 05/18/2014 0340   CREATININE 0.7 10/25/2014 1251   CREATININE 0.52 05/22/2014 0558      Component Value Date/Time   CALCIUM 9.9 10/25/2014 1251   CALCIUM 8.4 05/18/2014 0340   ALKPHOS 106 10/25/2014 1251   ALKPHOS 62 01/23/2014 0511   AST 23 10/25/2014 1251   AST 15 01/23/2014 0511   ALT 19 10/25/2014 1251   ALT 10 01/23/2014 0511   BILITOT 0.85 10/25/2014 1251    BILITOT 0.7 01/23/2014 0511     EXAM: NUCLEAR MEDICINE PET SKULL BASE TO THIGH  TECHNIQUE: 6.9 mCi F-18 FDG was injected intravenously. Full-ring PET imaging was performed from the skull base to thigh after the radiotracer. CT data was obtained and used for attenuation correction and anatomic localization.  FASTING BLOOD GLUCOSE: Value: 88 mg/dl  COMPARISON: PET-CT 03/02/2014, CT 04/04/2014  FINDINGS: NECK  No hypermetabolic lymph nodes in the neck.  CHEST  No hypermetabolic mediastinal adenopathy. There is a new pulmonary nodule the RIGHT lower lobe medially at the esophageal recess measuring 5 mm image 46, series 9. Nodule has \\faint  metabolic activity.  ABDOMEN/PELVIS  There is interval enlargement of the hepatic metastasis the central RIGHT hepatic lobe measuring 5 cm compared to 1.5 cm on comparison exam. Lesion is intensely hypermetabolic with SUV max equal 8.3. Three small additional hypermetabolic foci the liver parenchyma consistent with metastasis. One lesion LEFT hepatic lobe on image 110 and 2 lesions in the RIGHT hepatic lobe on  image 105 and 112.  No hypermetabolic abdominal adenopathy. No abnormal metabolic activity associated rectal surgical site. LEFT lower quadrant colostomy  SKELETON  No hypermetabolic skeletal metastasis  IMPRESSION: 1. Progression of hepatic metastasis with significant enlargement of the previously described central lesion and three new lesions. 2. New small pulmonary nodule in the RIGHT lower lobe is concerning for pulmonary metastasis.     Impression and Plan:  81 year old woman with the following issues:   1. Rectal cancer presented with hematochezia and status post colonoscopy and EUS. The care of by EUS criteria was 4 cm in length around 4-5 cm from the anal verge. The staging was T3 N0 M1 with possible liver metastasis. She is status post radiation therapy with oral Xeloda with still residual tumor  as evident by the PET scan results.   She is status post surgical resection with a pathology revealed invasive adenocarcinoma through the muscularis propria. There are 2 extramural satellite tumor nodules. 12 lymph nodes were negative for metastatic cancer.  PET CT scan on 09/05/2014 was discussed today with the patient and her son. Options of treatment were reviewed including systemic chemotherapy versus supportive care. Chemotherapy can be in the form of intravenous multi agent regimen such as FOLFOX or FOLFIRI. Oral regimen would include Xeloda plus or minus a biological agent. After discussing the risks and benefits of these options again today she is motivated to try some form of treatment. I do not think she is a candidate for multi agent intravenously. She reports that she would like to have some treatment and maintained reasonable quality of life.  Oral Xeloda would be a reasonable option at this time. I will started low-dose at 500 mg twice a day 2 weeks on 1 week off. We can escalate the dose depending on her tolerance. Complications associated with this medication including diarrhea, mucositis, myelosuppression among others. I instructed her to alert me to any these complications and is a happen. I also instructed her to use antidiarrhea medication diarrhea presents itself.  Alternatively, supportive care only would be a reasonable option given her clinical status. He is asymptomatic from her cancer and her cancer is not curable in any case. This could be an option if she cannot tolerate chemotherapy.  2. Hepatic masses. This could represent metastatic disease from her rectal cancer. I do not think a liver directed therapy is an option given the systemic nature of her cancer.  3. History of pulmonary embolism: She is on full dose anticoagulation with Xarelto. She needs to be on lifetime anticoagulation as she developed another blood clot off of it.   4. Left lower extremity weakness and  possible neuropathy: Seems to be improving with physical therapy.  5. Follow-up: Will be to 4 weeks to assess complications after the first cycle of chemotherapy.  [facility], MD 9/15/20161:39 PM

## 2014-10-25 NOTE — Telephone Encounter (Signed)
Gave and pritned appt sched and avs for pt for OCT °

## 2014-10-26 ENCOUNTER — Encounter: Payer: Self-pay | Admitting: Oncology

## 2014-10-26 LAB — CEA: CEA: 9.4 ng/mL — ABNORMAL HIGH (ref 0.0–5.0)

## 2014-10-26 NOTE — Progress Notes (Signed)
I faxed biologics req for xeloda

## 2014-10-29 ENCOUNTER — Encounter: Payer: Self-pay | Admitting: *Deleted

## 2014-10-31 ENCOUNTER — Encounter: Payer: Self-pay | Admitting: Oncology

## 2014-10-31 NOTE — Progress Notes (Signed)
Per biologics capecitabine wasw shipped via [employer]

## 2014-11-05 ENCOUNTER — Encounter: Payer: Self-pay | Admitting: Oncology

## 2014-11-05 NOTE — Progress Notes (Signed)
Per biologics xeloda was delivered 10/31/14   50 copay

## 2014-11-22 ENCOUNTER — Telehealth: Payer: Self-pay | Admitting: Oncology

## 2014-11-22 ENCOUNTER — Ambulatory Visit (HOSPITAL_BASED_OUTPATIENT_CLINIC_OR_DEPARTMENT_OTHER): Payer: Medicare Other | Admitting: Oncology

## 2014-11-22 ENCOUNTER — Other Ambulatory Visit (HOSPITAL_BASED_OUTPATIENT_CLINIC_OR_DEPARTMENT_OTHER): Payer: Medicare Other

## 2014-11-22 VITALS — BP 155/70 | HR 59 | Temp 99.0°F | Resp 18 | Ht 66.0 in | Wt 122.3 lb

## 2014-11-22 DIAGNOSIS — Z7901 Long term (current) use of anticoagulants: Secondary | ICD-10-CM

## 2014-11-22 DIAGNOSIS — C787 Secondary malignant neoplasm of liver and intrahepatic bile duct: Secondary | ICD-10-CM | POA: Diagnosis not present

## 2014-11-22 DIAGNOSIS — Z86711 Personal history of pulmonary embolism: Secondary | ICD-10-CM | POA: Diagnosis not present

## 2014-11-22 DIAGNOSIS — C20 Malignant neoplasm of rectum: Secondary | ICD-10-CM | POA: Diagnosis not present

## 2014-11-22 DIAGNOSIS — R531 Weakness: Secondary | ICD-10-CM

## 2014-11-22 LAB — COMPREHENSIVE METABOLIC PANEL (CC13)
ALBUMIN: 4 g/dL (ref 3.5–5.0)
ALK PHOS: 112 U/L (ref 40–150)
ALT: 18 U/L (ref 0–55)
AST: 21 U/L (ref 5–34)
Anion Gap: 7 mEq/L (ref 3–11)
BUN: 20.7 mg/dL (ref 7.0–26.0)
CHLORIDE: 103 meq/L (ref 98–109)
CO2: 31 mEq/L — ABNORMAL HIGH (ref 22–29)
CREATININE: 0.6 mg/dL (ref 0.6–1.1)
Calcium: 9.7 mg/dL (ref 8.4–10.4)
EGFR: 82 mL/min/{1.73_m2} — ABNORMAL LOW (ref >90)
GLUCOSE: 87 mg/dL (ref 70–140)
POTASSIUM: 3.9 meq/L (ref 3.5–5.1)
SODIUM: 142 meq/L (ref 136–145)
Total Bilirubin: 0.92 mg/dL (ref 0.20–1.20)
Total Protein: 6.6 g/dL (ref 6.4–8.3)

## 2014-11-22 LAB — CBC WITH DIFFERENTIAL/PLATELET
BASO%: 1.1 % (ref 0.0–2.0)
BASOS ABS: 0 10*3/uL (ref 0.0–0.1)
EOS%: 2.6 % (ref 0.0–7.0)
Eosinophils Absolute: 0.1 10*3/uL (ref 0.0–0.5)
HCT: 39.1 % (ref 34.8–46.6)
HGB: 12.9 g/dL (ref 11.6–15.9)
LYMPH%: 14 % (ref 14.0–49.7)
MCH: 30.9 pg (ref 25.1–34.0)
MCHC: 33 g/dL (ref 31.5–36.0)
MCV: 93.6 fL (ref 79.5–101.0)
MONO#: 0.3 10*3/uL (ref 0.1–0.9)
MONO%: 7.6 % (ref 0.0–14.0)
NEUT%: 74.7 % (ref 38.4–76.8)
NEUTROS ABS: 2.5 10*3/uL (ref 1.5–6.5)
Platelets: 164 10*3/uL (ref 145–400)
RBC: 4.18 10*6/uL (ref 3.70–5.45)
RDW: 13.9 % (ref 11.2–14.5)
WBC: 3.3 10*3/uL — ABNORMAL LOW (ref 3.9–10.3)
lymph#: 0.5 10*3/uL — ABNORMAL LOW (ref 0.9–3.3)

## 2014-11-22 LAB — CEA: CEA: 11.8 ng/mL — ABNORMAL HIGH (ref 0.0–5.0)

## 2014-11-22 NOTE — Telephone Encounter (Signed)
Gave adn printed appt sched and avs fo rpt for NOV °

## 2014-11-22 NOTE — Progress Notes (Signed)
Hematology and Oncology Follow Up Visit  Bethany [name] [id number] Aug 10, 1928 81 y.o. 11/22/2014 1:25 PM Bethany [name], MDKim, Bethany Rinks, MD   Principle Diagnosis: 81 year old woman with rectal adenocarcinoma diagnosed in August of 2015. Her staging by EUS his T3 N0. MRI of the liver showed potential hepatic metastasis.  Past therapy:  Radiation therapy concomitantly with oral Xeloda at 500 mg twice a day started on 10/25/2013. Therapy concluded in October 2015. She received total dose to 4320 cGy. Therapy interrupted due to recurrent hospitalizations. She is status post robotic-assisted rectosigmoid low anterior resection done on 05/17/2014. The pathology revealed a T3 N1 disease. PET/CT scan in July 2016 showed progresses metastatic disease in the liver and pulmonary nodules.   Current therapy: Xeloda 1000 mg daily started in September 2016. She takes this medication for 2 weeks on 1 week off per cycle. She is status post the first cycle at this time related to proceed with cycle 2.  Interim History:  Bethany [name] for a followup visit. Since the last visit, she started Xeloda without complications. She denied any hand-foot syndrome, oral pain or diarrhea. She continues to improve slowly gaining weight. Her mobility is improving still uses a walker at this time. She has not reported any falls or syncope.She has not reported any back pain or abdominal pain. She has no problems with her ostomy at this time.   She does have loose bowel habits at times. Her performance status is continues to be close to baseline. She has reported some foot pain associated with the heel spur.  She has not had any headaches or blurry vision. She does not report any syncope or seizures. She does not report any chest pain, shortness of breath, cough or hemoptysis. She does not report any palpitation, orthopnea or PND. She does not report any leg edema. She denies abdominal pain, constipation or obstruction. She does  not report any frequency urgency or hesitancy. She is not abort any skeletal complaints. Rest of the review of systems unremarkable.     Medications: I have reviewed the patient's current medications.  Current Outpatient Prescriptions  Medication Sig Dispense Refill  . acetaminophen (TYLENOL) 325 MG tablet Take 2 tablets (650 mg total) by mouth every 6 (six) hours as needed for mild pain (or Fever >/= 101).    . capecitabine (XELODA) 500 MG tablet Take one tablet twice a day for 14 days then 7 days off. 28 tablet 1  . cholecalciferol (VITAMIN D) 1000 UNITS tablet Take 2,000 Units by mouth daily.    . feeding supplement, RESOURCE BREEZE, (RESOURCE BREEZE) LIQD Take 1 Container by mouth 2 (two) times daily between meals.  0  . gabapentin (NEURONTIN) 100 MG capsule Take 1 capsule (100 mg total) by mouth 3 (three) times daily. 90 capsule 6  . levothyroxine (SYNTHROID, LEVOTHROID) 25 MCG tablet Take 25 mcg by mouth See admin instructions. 25 mcg  on Monday, Wednesday, Friday and Sunday.    . levothyroxine (SYNTHROID, LEVOTHROID) 75 MCG tablet Take 1 tablet by mouth See admin instructions. 44mcg on Tuesday, Thursday, Saturday.    . magnesium chloride (SLOW-MAG) 64 MG TBEC SR tablet Take 1 tablet (64 mg total) by mouth daily. 30 tablet 2  . polyethylene glycol (MIRALAX / GLYCOLAX) packet Take 17 g by mouth every 12 (twelve) hours as needed for mild constipation, moderate constipation or severe constipation. 14 each 0  . potassium chloride SA (K-DUR,KLOR-CON) 20 MEQ tablet Take 2 tablets (40 mEq total) by mouth  daily. 30 tablet 2  . Probiotic Product (RISA-BID PROBIOTIC PO) Take 1 tablet by mouth 2 (two) times daily.    . rivaroxaban (XARELTO) 20 MG TABS tablet Take 1 tablet (20 mg total) by mouth daily with supper. 30 tablet 0  . sertraline (ZOLOFT) 50 MG tablet Take 50 mg by mouth daily.  1   No current facility-administered medications for this visit.     Allergies:  Allergies  Allergen  Reactions  . Cataflam [Diclofenac] Other (See Comments)    Unknown; patient is unaware of allergy.  . Cephalosporins Shortness Of Breath  . Erythromycin Shortness Of Breath  . Keflex [Cephalexin] Shortness Of Breath  . Propoxyphene     Unknown rxn  . Codeine Nausea And Vomiting  . Naproxen Other (See Comments)    Unknown; patient is unaware of allergy  . Penicillins Other (See Comments)    Patient states that she CAN take this; unaware of allergy  . Synalgos-Dc [Aspirin-Caff-Dihydrocodeine] Other (See Comments)    Unknown; patient is unaware of allergy  . Ultram [Tramadol] Other (See Comments)    Unknown; patient is unaware of allergy    Past Medical History, Surgical history, Social history, and Family History were reviewed and updated.   Physical Exam: Blood pressure 155/70, pulse 59, temperature 99 F (37.2 C), temperature source Oral, resp. rate 18, height 5\' 6"  (1.676 m), weight 122 lb 4.8 oz (55.475 kg), SpO2 100 %. ECOG: 2 General appearance: Alert, awake frail woman appeared comfortable [name]. Head: Normocephalic, without obvious abnormality oral mucosa showed no ulcers or lesions. No erythema or induration. Neck: no adenopathy Lymph nodes: Cervical, supraclavicular, and axillary nodes normal. Heart: Irregular without murmurs, rubs or gallops. Lung:chest clear, no wheezing, rales, normal symmetric air entry. Abdomen: soft, non-tender, without masses or organomegaly. Ostomy site appears clean and dry. EXT:no erythema, induration, or nodules.  Neurological: No deficits.   Lab Results: Lab Results  Component Value Date   WBC 3.3* 11/22/2014   HGB 12.9 11/22/2014   HCT 39.1 11/22/2014   MCV 93.6 11/22/2014   PLT 164 11/22/2014     Chemistry      Component Value Date/Time   NA 142 11/22/2014 1242   NA 141 05/18/2014 0340   K 3.9 11/22/2014 1242   K 4.1 05/23/2014 0430   CL 102 05/18/2014 0340   CO2 31* 11/22/2014 1242   CO2 33* 05/18/2014 0340   BUN 20.7  11/22/2014 1242   BUN 14 05/18/2014 0340   CREATININE 0.6 11/22/2014 1242   CREATININE 0.52 05/22/2014 0558      Component Value Date/Time   CALCIUM 9.7 11/22/2014 1242   CALCIUM 8.4 05/18/2014 0340   ALKPHOS 112 11/22/2014 1242   ALKPHOS 62 01/23/2014 0511   AST 21 11/22/2014 1242   AST 15 01/23/2014 0511   ALT 18 11/22/2014 1242   ALT 10 01/23/2014 0511   BILITOT 0.92 11/22/2014 1242   BILITOT 0.7 01/23/2014 0511         Impression and Plan:  81 year old woman with the following issues:   1. Rectal cancer presented with hematochezia and status post colonoscopy and EUS. The care of by EUS criteria was 4 cm in length around 4-5 cm from the anal verge. The staging was T3 N0 M1 with possible liver metastasis. She is status post radiation therapy with oral Xeloda with still residual tumor as evident by the PET scan results.   She is status post surgical resection with a pathology revealed invasive adenocarcinoma  through the muscularis propria. There are 2 extramural satellite tumor nodules. 12 lymph nodes were negative for metastatic cancer.  PET CT scan on 09/05/2014 showed progression of disease and currently on Xeloda. She has tolerated the first cycle at 1000 mg without any complications. The plan is to proceed with cycle 2 of the same dose and schedule. I plan on increasing her dose to 1500 mg daily starting with the third cycle. I anticipate repeat imaging studies in December 2016.   2. Hepatic masses. This could represent metastatic disease from her rectal cancer. I do not think a liver directed therapy is an option given the systemic nature of her cancer.  3. History of pulmonary embolism: She is on full dose anticoagulation with Xarelto. No bleeding or thrombosis episode since the last visit.  4. Left lower extremity weakness and possible neuropathy: Seems to be improving with physical therapy.  5. Follow-up: Will be to 3 weeks to assess complications after the first  cycle of chemotherapy.  Zola Button, MD 10/13/20161:25 PM

## 2014-12-04 ENCOUNTER — Other Ambulatory Visit: Payer: Self-pay | Admitting: *Deleted

## 2014-12-04 MED ORDER — CAPECITABINE 500 MG PO TABS: ORAL_TABLET | ORAL | Status: DC

## 2014-12-10 ENCOUNTER — Encounter: Payer: Self-pay | Admitting: Oncology

## 2014-12-10 NOTE — Progress Notes (Signed)
Per biologics capecitabine was shipped via [employer]

## 2014-12-13 ENCOUNTER — Other Ambulatory Visit (HOSPITAL_BASED_OUTPATIENT_CLINIC_OR_DEPARTMENT_OTHER): Payer: Medicare Other

## 2014-12-13 ENCOUNTER — Other Ambulatory Visit: Payer: Self-pay | Admitting: Neurology

## 2014-12-13 ENCOUNTER — Ambulatory Visit (HOSPITAL_BASED_OUTPATIENT_CLINIC_OR_DEPARTMENT_OTHER): Payer: Medicare Other | Admitting: Oncology

## 2014-12-13 ENCOUNTER — Telehealth: Payer: Self-pay | Admitting: Oncology

## 2014-12-13 VITALS — BP 168/79 | HR 71 | Temp 97.6°F | Resp 19 | Ht 66.0 in | Wt 123.7 lb

## 2014-12-13 DIAGNOSIS — C20 Malignant neoplasm of rectum: Secondary | ICD-10-CM | POA: Diagnosis not present

## 2014-12-13 DIAGNOSIS — K769 Liver disease, unspecified: Secondary | ICD-10-CM | POA: Diagnosis not present

## 2014-12-13 DIAGNOSIS — Z86711 Personal history of pulmonary embolism: Secondary | ICD-10-CM | POA: Diagnosis not present

## 2014-12-13 DIAGNOSIS — R531 Weakness: Secondary | ICD-10-CM

## 2014-12-13 LAB — CBC WITH DIFFERENTIAL/PLATELET
BASO%: 1 % (ref 0.0–2.0)
Basophils Absolute: 0 10*3/uL (ref 0.0–0.1)
EOS%: 2.1 % (ref 0.0–7.0)
Eosinophils Absolute: 0.1 10*3/uL (ref 0.0–0.5)
HEMATOCRIT: 40.4 % (ref 34.8–46.6)
HEMOGLOBIN: 13.5 g/dL (ref 11.6–15.9)
LYMPH#: 0.5 10*3/uL — AB (ref 0.9–3.3)
LYMPH%: 15.2 % (ref 14.0–49.7)
MCH: 31.9 pg (ref 25.1–34.0)
MCHC: 33.4 g/dL (ref 31.5–36.0)
MCV: 95.5 fL (ref 79.5–101.0)
MONO#: 0.3 10*3/uL (ref 0.1–0.9)
MONO%: 9.3 % (ref 0.0–14.0)
NEUT%: 72.4 % (ref 38.4–76.8)
NEUTROS ABS: 2.5 10*3/uL (ref 1.5–6.5)
PLATELETS: 154 10*3/uL (ref 145–400)
RBC: 4.23 10*6/uL (ref 3.70–5.45)
RDW: 15.6 % — ABNORMAL HIGH (ref 11.2–14.5)
WBC: 3.4 10*3/uL — AB (ref 3.9–10.3)

## 2014-12-13 LAB — COMPREHENSIVE METABOLIC PANEL (CC13)
ALBUMIN: 4.1 g/dL (ref 3.5–5.0)
ALK PHOS: 112 U/L (ref 40–150)
ALT: 17 U/L (ref 0–55)
ANION GAP: 7 meq/L (ref 3–11)
AST: 20 U/L (ref 5–34)
BILIRUBIN TOTAL: 1.02 mg/dL (ref 0.20–1.20)
BUN: 20.4 mg/dL (ref 7.0–26.0)
CALCIUM: 10 mg/dL (ref 8.4–10.4)
CO2: 32 mEq/L — ABNORMAL HIGH (ref 22–29)
CREATININE: 0.6 mg/dL (ref 0.6–1.1)
Chloride: 102 mEq/L (ref 98–109)
EGFR: 83 mL/min/{1.73_m2} — ABNORMAL LOW (ref >90)
Glucose: 92 mg/dl (ref 70–140)
Potassium: 3.7 mEq/L (ref 3.5–5.1)
Sodium: 142 mEq/L (ref 136–145)
TOTAL PROTEIN: 6.7 g/dL (ref 6.4–8.3)

## 2014-12-13 NOTE — Progress Notes (Signed)
Hematology and Oncology Follow Up Visit  Bethany [name] [id number] Oct 12, 1928 81 y.o. 12/13/2014 2:50 PM Bethany [name], MDKim, Bethany Rinks, MD   Principle Diagnosis: 81 year old woman with rectal adenocarcinoma diagnosed in August of 2015. Her staging by EUS his T3 N0. MRI of the liver showed potential hepatic metastasis.  Past therapy:  Radiation therapy concomitantly with oral Xeloda at 500 mg twice a day started on 10/25/2013. Therapy concluded in October 2015. She received total dose to 4320 cGy. Therapy interrupted due to recurrent hospitalizations. She is status post robotic-assisted rectosigmoid low anterior resection done on 05/17/2014. The pathology revealed a T3 N1 disease. PET/CT scan in July 2016 showed progresses metastatic disease in the liver and pulmonary nodules.   Current therapy: Xeloda 1000 mg daily started in September 2016. She takes it 2 weeks on 1 week off per cycle. She is status post 2 cycles of therapy related to proceed with a third one.  Interim History:  Bethany [name] presents today for a followup visit. Since the last visit, she reports feeling well. She continues to be on Xeloda without any new complications. She denied any hand-foot syndrome, oral pain or diarrhea. She did have 2 episodes of loose bowel movements after eating certain foods which have resolved at this time.   She continues to improve slowly gaining weight. Her mobility is improving still uses a walker at this time. She has not reported any falls or syncope.She has not reported any back pain or abdominal pain. She has no problems with her ostomy at this time.   She has not had any headaches or blurry vision. She does not report any syncope or seizures. She does not report any chest pain, shortness of breath, cough or hemoptysis. She does not report any palpitation, orthopnea or PND. She does not report any leg edema. She denies abdominal pain, constipation or obstruction. She does not report any frequency urgency  or hesitancy. She is not abort any skeletal complaints. Rest of the review of systems unremarkable.     Medications: I have reviewed the patient's current medications.  Current Outpatient Prescriptions  Medication Sig Dispense Refill  . acetaminophen (TYLENOL) 325 MG tablet Take 2 tablets (650 mg total) by mouth every 6 (six) hours as needed for mild pain (or Fever >/= 101).    . capecitabine (XELODA) 500 MG tablet Take one tablet twice a day for 14 days then 7 days off. 28 tablet 0  . cholecalciferol (VITAMIN D) 1000 UNITS tablet Take 2,000 Units by mouth daily.    . feeding supplement, RESOURCE BREEZE, (RESOURCE BREEZE) LIQD Take 1 Container by mouth 2 (two) times daily between meals.  0  . gabapentin (NEURONTIN) 100 MG capsule TAKE 1 CAPSULE BY MOUTH THREE TIMES DAILY 90 capsule 0  . levothyroxine (SYNTHROID, LEVOTHROID) 25 MCG tablet Take 25 mcg by mouth See admin instructions. 25 mcg  on Monday, Wednesday, Friday and Sunday.    . levothyroxine (SYNTHROID, LEVOTHROID) 75 MCG tablet Take 1 tablet by mouth See admin instructions. 66mcg on Tuesday, Thursday, Saturday.    . magnesium chloride (SLOW-MAG) 64 MG TBEC SR tablet Take 1 tablet (64 mg total) by mouth daily. 30 tablet 2  . polyethylene glycol (MIRALAX / GLYCOLAX) packet Take 17 g by mouth every 12 (twelve) hours as needed for mild constipation, moderate constipation or severe constipation. 14 each 0  . potassium chloride SA (K-DUR,KLOR-CON) 20 MEQ tablet Take 2 tablets (40 mEq total) by mouth daily. 30 tablet 2  . Probiotic  Product (RISA-BID PROBIOTIC PO) Take 1 tablet by mouth 2 (two) times daily.    . rivaroxaban (XARELTO) 20 MG TABS tablet Take 1 tablet (20 mg total) by mouth daily with supper. 30 tablet 0  . sertraline (ZOLOFT) 50 MG tablet Take 50 mg by mouth daily.  1   No current facility-administered medications for this visit.     Allergies:  Allergies  Allergen Reactions  . Cataflam [Diclofenac] Other (See Comments)     Unknown; patient is unaware of allergy.  . Cephalosporins Shortness Of Breath  . Erythromycin Shortness Of Breath  . Keflex [Cephalexin] Shortness Of Breath  . Propoxyphene     Unknown rxn  . Codeine Nausea And Vomiting  . Naproxen Other (See Comments)    Unknown; patient is unaware of allergy  . Penicillins Other (See Comments)    Patient states that she CAN take this; unaware of allergy  . Synalgos-Dc [Aspirin-Caff-Dihydrocodeine] Other (See Comments)    Unknown; patient is unaware of allergy  . Ultram [Tramadol] Other (See Comments)    Unknown; patient is unaware of allergy    Past Medical History, Surgical history, Social history, and Family History were reviewed and updated.   Physical Exam: Blood pressure 168/79, pulse 71, temperature 97.6 F (36.4 C), temperature source Oral, resp. rate 19, height 5\' 6"  (1.676 m), weight 123 lb 11.2 oz (56.11 kg), SpO2 100 %. ECOG: 2 General appearance: Frail woman without any distress today. Head: Normocephalic, without obvious abnormality  No erythema or induration noted in her oral mucosa. Neck: no adenopathy Lymph nodes: Cervical, supraclavicular, and axillary nodes normal. Heart: Irregular without murmurs, rubs or gallops. Lung:chest clear, no wheezing, rales, normal symmetric air entry. No rhonchi. Abdomen: soft, non-tender, without masses or organomegaly. Ostomy site appears clean and dry. EXT:no erythema, induration, or nodules.  Neurological: No deficits.   Lab Results: Lab Results  Component Value Date   WBC 3.4* 12/13/2014   HGB 13.5 12/13/2014   HCT 40.4 12/13/2014   MCV 95.5 12/13/2014   PLT 154 12/13/2014     Chemistry      Component Value Date/Time   NA 142 11/22/2014 1242   NA 141 05/18/2014 0340   K 3.9 11/22/2014 1242   K 4.1 05/23/2014 0430   CL 102 05/18/2014 0340   CO2 31* 11/22/2014 1242   CO2 33* 05/18/2014 0340   BUN 20.7 11/22/2014 1242   BUN 14 05/18/2014 0340   CREATININE 0.6 11/22/2014 1242    CREATININE 0.52 05/22/2014 0558      Component Value Date/Time   CALCIUM 9.7 11/22/2014 1242   CALCIUM 8.4 05/18/2014 0340   ALKPHOS 112 11/22/2014 1242   ALKPHOS 62 01/23/2014 0511   AST 21 11/22/2014 1242   AST 15 01/23/2014 0511   ALT 18 11/22/2014 1242   ALT 10 01/23/2014 0511   BILITOT 0.92 11/22/2014 1242   BILITOT 0.7 01/23/2014 0511         Impression and Plan:  81 year old woman with the following issues:   1. Rectal cancer presented with hematochezia and status post colonoscopy and EUS. The care of by EUS criteria was 4 cm in length around 4-5 cm from the anal verge. The staging was T3 N0 M1 with possible liver metastasis. She is status post radiation therapy with oral Xeloda with still residual tumor as evident by the PET scan results.   She is status post surgical resection with a pathology revealed invasive adenocarcinoma through the muscularis propria. There are 2  extramural satellite tumor nodules. 12 lymph nodes were negative for metastatic cancer.  PET CT scan on 09/05/2014 showed progression of disease and currently on Xeloda. She has tolerated the first 2 cycles at 1000 mg without any complications. The plan is to proceed with cycle 3 of the same dose and schedule. She will have a repeat PET scan in November 2016 before the next visit. If her disease is relatively stable, we'll increase her Xeloda dose to 1500 mg daily.   2. Hepatic masses. This could represent metastatic disease from her rectal cancer. Liver directed therapy can be an option down the line.  3. History of pulmonary embolism: She is on full dose anticoagulation with Xarelto. No bleeding or thrombosis episode since the last visit.  4. Left lower extremity weakness and possible neuropathy: Seems to be improving with physical therapy. She continues to use a walker without any falls or syncope. We'll continue to monitor this.  5. Follow-up: Will be to 3 weeks to assess complications after the first  cycle of chemotherapy.  PJKDTO,IZTIW, MD 11/3/20162:50 PM

## 2014-12-13 NOTE — Telephone Encounter (Signed)
Gave and printed appt sched and avs for pt for DEc °

## 2014-12-13 NOTE — Telephone Encounter (Signed)
gv and printed appt sched and avs for pt for NOV °

## 2014-12-24 ENCOUNTER — Other Ambulatory Visit: Payer: Self-pay | Admitting: *Deleted

## 2014-12-24 DIAGNOSIS — C20 Malignant neoplasm of rectum: Secondary | ICD-10-CM

## 2014-12-24 DIAGNOSIS — C787 Secondary malignant neoplasm of liver and intrahepatic bile duct: Secondary | ICD-10-CM

## 2014-12-24 MED ORDER — CAPECITABINE 500 MG PO TABS: ORAL_TABLET | ORAL | Status: DC

## 2015-01-02 ENCOUNTER — Encounter (HOSPITAL_COMMUNITY)
Admission: RE | Admit: 2015-01-02 | Discharge: 2015-01-02 | Disposition: A | Discharge status: Home / Self Care | Payer: Medicare Other | Source: Ambulatory Visit | Attending: Oncology | Admitting: Oncology

## 2015-01-02 DIAGNOSIS — C20 Malignant neoplasm of rectum: Secondary | ICD-10-CM | POA: Insufficient documentation

## 2015-01-02 LAB — GLUCOSE, CAPILLARY: Glucose-Capillary: 95 mg/dL (ref 65–99)

## 2015-01-02 MED ORDER — FLUDEOXYGLUCOSE F - 18 (FDG) INJECTION
7.0800 | Freq: Once | INTRAVENOUS | Status: DC | PRN
  Administered 2015-01-02: 7.08 via INTRAVENOUS
  Filled 2015-01-02: qty 7.08

## 2015-01-04 ENCOUNTER — Other Ambulatory Visit: Payer: Self-pay | Admitting: *Deleted

## 2015-01-04 ENCOUNTER — Ambulatory Visit (HOSPITAL_BASED_OUTPATIENT_CLINIC_OR_DEPARTMENT_OTHER): Payer: Medicare Other | Admitting: Oncology

## 2015-01-04 ENCOUNTER — Other Ambulatory Visit (HOSPITAL_BASED_OUTPATIENT_CLINIC_OR_DEPARTMENT_OTHER): Payer: Medicare Other

## 2015-01-04 ENCOUNTER — Telehealth: Payer: Self-pay | Admitting: Oncology

## 2015-01-04 VITALS — BP 166/58 | HR 56 | Temp 97.6°F | Resp 18 | Ht 66.0 in | Wt 125.1 lb

## 2015-01-04 DIAGNOSIS — Z86711 Personal history of pulmonary embolism: Secondary | ICD-10-CM | POA: Diagnosis not present

## 2015-01-04 DIAGNOSIS — R945 Abnormal results of liver function studies: Secondary | ICD-10-CM | POA: Diagnosis not present

## 2015-01-04 DIAGNOSIS — C787 Secondary malignant neoplasm of liver and intrahepatic bile duct: Secondary | ICD-10-CM

## 2015-01-04 DIAGNOSIS — C20 Malignant neoplasm of rectum: Secondary | ICD-10-CM | POA: Diagnosis not present

## 2015-01-04 LAB — CBC WITH DIFFERENTIAL/PLATELET
BASO%: 1.3 % (ref 0.0–2.0)
BASOS ABS: 0 10*3/uL (ref 0.0–0.1)
EOS%: 2 % (ref 0.0–7.0)
Eosinophils Absolute: 0.1 10*3/uL (ref 0.0–0.5)
HCT: 38.1 % (ref 34.8–46.6)
HGB: 12.9 g/dL (ref 11.6–15.9)
LYMPH#: 0.4 10*3/uL — AB (ref 0.9–3.3)
LYMPH%: 11.6 % — ABNORMAL LOW (ref 14.0–49.7)
MCH: 33 pg (ref 25.1–34.0)
MCHC: 33.9 g/dL (ref 31.5–36.0)
MCV: 97.3 fL (ref 79.5–101.0)
MONO#: 0.3 10*3/uL (ref 0.1–0.9)
MONO%: 8.3 % (ref 0.0–14.0)
NEUT#: 2.7 10*3/uL (ref 1.5–6.5)
NEUT%: 76.8 % (ref 38.4–76.8)
PLATELETS: 146 10*3/uL (ref 145–400)
RBC: 3.92 10*6/uL (ref 3.70–5.45)
RDW: 16.3 % — ABNORMAL HIGH (ref 11.2–14.5)
WBC: 3.5 10*3/uL — ABNORMAL LOW (ref 3.9–10.3)

## 2015-01-04 LAB — COMPREHENSIVE METABOLIC PANEL (CC13)
ALT: 26 U/L (ref 0–55)
ANION GAP: 7 meq/L (ref 3–11)
AST: 36 U/L — AB (ref 5–34)
Albumin: 3.8 g/dL (ref 3.5–5.0)
Alkaline Phosphatase: 115 U/L (ref 40–150)
BUN: 28 mg/dL — ABNORMAL HIGH (ref 7.0–26.0)
CHLORIDE: 103 meq/L (ref 98–109)
CO2: 31 mEq/L — ABNORMAL HIGH (ref 22–29)
Calcium: 9.7 mg/dL (ref 8.4–10.4)
Creatinine: 0.7 mg/dL (ref 0.6–1.1)
EGFR: 80 mL/min/{1.73_m2} — AB (ref >90)
Glucose: 95 mg/dl (ref 70–140)
Potassium: 4.2 mEq/L (ref 3.5–5.1)
Sodium: 140 mEq/L (ref 136–145)
Total Bilirubin: 0.79 mg/dL (ref 0.20–1.20)
Total Protein: 6.3 g/dL — ABNORMAL LOW (ref 6.4–8.3)

## 2015-01-04 MED ORDER — CAPECITABINE 500 MG PO TABS: ORAL_TABLET | ORAL | Status: DC

## 2015-01-04 NOTE — Progress Notes (Signed)
Hematology and Oncology Follow Up Visit  LEEA LANGLEY [id number] 12-31-1928 81 y.o. 01/04/2015 10:19 AM [name], MDKim, [name], MD   Principle Diagnosis: 81 year old woman with rectal adenocarcinoma diagnosed in August of 2015. Her staging by EUS his T3 N0. MRI of the liver showed potential hepatic metastasis.  Past therapy:  Radiation therapy concomitantly with oral Xeloda at 500 mg twice a day started on 10/25/2013. Therapy concluded in October 2015. She received total dose to 4320 cGy. Therapy interrupted due to recurrent hospitalizations. She is status post robotic-assisted rectosigmoid low anterior resection done on 05/17/2014. The pathology revealed a T3 N1 disease. PET/CT scan in July 2016 showed progresses metastatic disease in the liver and pulmonary nodules.   Current therapy:  Xeloda 1000 mg daily started in September 2016. She takes it 2 weeks on 1 week off per cycle. She is status post 3 cycles of therapy related to proceed with a third one. The dose will be increased to 1500 mg daily starting on 01/04/2015.  Interim History:  Ms. Dubroc presents today for a followup visit. Since the last visit, she continues to do very well with a reasonable quality of life. She continues to be on Xeloda without any new complications. She denied any hand-foot syndrome, oral pain or diarrhea. She did have episodic abdominal pain which have resolved at this time.  She continues to improve slowly gaining weight. Her mobility is improving still uses a walker at this time. She has not reported any deterioration in her health or falls. She has not reported any recent hospitalization or illnesses. He did not report any infections.  She has not had any headaches or blurry vision. She does not report any syncope or seizures. She does not report any chest pain, shortness of breath, cough or hemoptysis. She does not report any palpitation, orthopnea or PND. She does not report any leg edema. She denies  abdominal pain, constipation or obstruction. She does not report any frequency urgency or hesitancy. She is not abort any skeletal complaints. Rest of the review of systems unremarkable.     Medications: I have reviewed the patient's current medications.  Current Outpatient Prescriptions  Medication Sig Dispense Refill  . acetaminophen (TYLENOL) 325 MG tablet Take 2 tablets (650 mg total) by mouth every 6 (six) hours as needed for mild pain (or Fever >/= 101).    . capecitabine (XELODA) 500 MG tablet Take one tablet in the morning and two tablets in the evening for 14 days then 7 days off. 42 tablet 0  . cholecalciferol (VITAMIN D) 1000 UNITS tablet Take 2,000 Units by mouth daily.    Marland Kitchen gabapentin (NEURONTIN) 100 MG capsule TAKE 1 CAPSULE BY MOUTH THREE TIMES DAILY 90 capsule 0  . levothyroxine (SYNTHROID, LEVOTHROID) 25 MCG tablet Take 25 mcg by mouth See admin instructions. 25 mcg  on Monday, Wednesday, Friday and Sunday.    . levothyroxine (SYNTHROID, LEVOTHROID) 75 MCG tablet Take 1 tablet by mouth See admin instructions. 29mcg on Tuesday, Thursday, Saturday.    . magnesium chloride (SLOW-MAG) 64 MG TBEC SR tablet Take 1 tablet (64 mg total) by mouth daily. 30 tablet 2  . polyethylene glycol (MIRALAX / GLYCOLAX) packet Take 17 g by mouth every 12 (twelve) hours as needed for mild constipation, moderate constipation or severe constipation. 14 each 0  . Probiotic Product (RISA-BID PROBIOTIC PO) Take 1 tablet by mouth 2 (two) times daily.    . rivaroxaban (XARELTO) 20 MG TABS tablet Take 1 tablet (  20 mg total) by mouth daily with supper. 30 tablet 0  . sertraline (ZOLOFT) 50 MG tablet Take 50 mg by mouth daily.  1  . feeding supplement, RESOURCE BREEZE, (RESOURCE BREEZE) LIQD Take 1 Container by mouth 2 (two) times daily between meals. (Patient not taking: Reported on 01/04/2015)  0   No current facility-administered medications for this visit.   Facility-Administered Medications Ordered in  Other Visits  Medication Dose Route Frequency Provider Last Rate Last Dose  . fludeoxyglucose F - 18 (FDG) injection 7.08 milli Curie  7.08 milli Curie Intravenous Once PRN Wyatt Portela, MD   7.08 milli Curie at 01/02/15 [id number]     Allergies:  Allergies  Allergen Reactions  . Cataflam [Diclofenac] Other (See Comments)    Unknown; patient is unaware of allergy.  . Cephalosporins Shortness Of Breath  . Erythromycin Shortness Of Breath  . Keflex [Cephalexin] Shortness Of Breath  . Propoxyphene     Unknown rxn  . Codeine Nausea And Vomiting  . Naproxen Other (See Comments)    Unknown; patient is unaware of allergy  . Penicillins Other (See Comments)    Patient states that she CAN take this; unaware of allergy  . Synalgos-Dc [Aspirin-Caff-Dihydrocodeine] Other (See Comments)    Unknown; patient is unaware of allergy  . Ultram [Tramadol] Other (See Comments)    Unknown; patient is unaware of allergy    Past Medical History, Surgical history, Social history, and Family History were reviewed and updated.   Physical Exam: Blood pressure 166/58, pulse 56, temperature 97.6 F (36.4 C), temperature source Oral, resp. rate 18, height 5\' 6"  (1.676 m), weight 125 lb 1.6 oz (56.745 kg), SpO2 100 %. ECOG: 2 General appearance: Alert and awake pleural appearing woman without distress. Head: Normocephalic, without obvious abnormality  No oral ulcers or lesions. Neck: no adenopathy Lymph nodes: Cervical, supraclavicular, and axillary nodes normal. Heart: Irregular without murmurs, rubs or gallops. Lung:chest clear, no wheezing, rales, normal symmetric air entry. No rhonchi. Abdomen: soft, non-tender, without masses or organomegaly. No rebound or guarding. EXT:no erythema, induration, or nodules.  Neurological: No deficits.   Lab Results: Lab Results  Component Value Date   WBC 3.5* 01/04/2015   HGB 12.9 01/04/2015   HCT 38.1 01/04/2015   MCV 97.3 01/04/2015   PLT 146 01/04/2015      Chemistry      Component Value Date/Time   NA 140 01/04/2015 0932   NA 141 05/18/2014 0340   K 4.2 01/04/2015 0932   K 4.1 05/23/2014 0430   CL 102 05/18/2014 0340   CO2 31* 01/04/2015 0932   CO2 33* 05/18/2014 0340   BUN 28.0* 01/04/2015 0932   BUN 14 05/18/2014 0340   CREATININE 0.7 01/04/2015 0932   CREATININE 0.52 05/22/2014 0558      Component Value Date/Time   CALCIUM 9.7 01/04/2015 0932   CALCIUM 8.4 05/18/2014 0340   ALKPHOS 115 01/04/2015 0932   ALKPHOS 62 01/23/2014 0511   AST 36* 01/04/2015 0932   AST 15 01/23/2014 0511   ALT 26 01/04/2015 0932   ALT 10 01/23/2014 0511   BILITOT 0.79 01/04/2015 0932   BILITOT 0.7 01/23/2014 0511     EXAM: NUCLEAR MEDICINE PET SKULL BASE TO THIGH  TECHNIQUE: 7.1 mCi F-18 FDG was injected intravenously. Full-ring PET imaging was performed from the skull base to thigh after the radiotracer. CT data was obtained and used for attenuation correction and anatomic localization.  FASTING BLOOD GLUCOSE: Value: 95 Mg/dl  COMPARISON: 09/05/2014 PET. 04/04/2014 abdominal pelvic CT.  FINDINGS: NECK  No areas of abnormal hypermetabolism.  CHEST  Bilateral pulmonary nodules. An index left upper lobe pulmonary nodule measures 7 mm and a S.U.V. max of 1.5 on image 30 of series 6. This is enlarged from 3 mm on the prior exam.  Right lower lobe index nodule measures 1.0 cm and a S.U.V. max of 3.1 on image 45 of series 6. Compare 5 mm and a S.U.V. max of 1.4 on the prior exam.  ABDOMEN/PELVIS  Progressive hepatic metastasis. Central right hepatic lobe lesion measures 5.2 cm by 4.0 and a S.U.V. max of 8.4 today. Compare 5.0 x 3.0 cm and similar hypermetabolism on the prior.  Lateral segment left liver lobe lesion measures 1.9 cm and a S.U.V. max of 8.7 today versus on the order of 8 mm and a S.U.V. max of 4.4 on the prior.  Other hepatic lesions which are new or significantly more well-defined and hypermetabolic  today.  No extrahepatic hypermetabolism within the abdomen or pelvis.  SKELETON  No suspicious osseous hypermetabolism. Left greater than right glenohumeral joint activity is likely degenerative.  CT IMAGES PERFORMED FOR ATTENUATION CORRECTION  No cervical adenopathy. Mild cardiomegaly. Right nephrolithiasis. Bilateral renal sinus cysts. Descending colostomy. Hysterectomy. Surgical sutures in the low rectum with surrounding fascia thickening which could be postsurgical and/or radiation induced. This is similar, without well-defined locally recurrent disease.  IMPRESSION: Significant progression of hypermetabolic hepatic and pulmonary metastasis.    Impression and Plan:  81 year old woman with the following issues:   1. Rectal cancer presented with hematochezia and status post colonoscopy and EUS. The care of by EUS criteria was 4 cm in length around 4-5 cm from the anal verge. The staging was T3 N0 M1 with possible liver metastasis. She is status post radiation therapy with oral Xeloda with still residual tumor as evident by the PET scan results.   She is status post surgical resection with a pathology revealed invasive adenocarcinoma through the muscularis propria. There are 2 extramural satellite tumor nodules. 12 lymph nodes were negative for metastatic cancer.  PET CT scan on 09/05/2014 showed progression of disease with disease in the liver and lung nodules.   She is currently on Xeloda. She has tolerated the first 3 cycles at 1000 mg without any complications. PET CT scan obtained on 01/02/2015 showed mild progression of disease but she continues to be asymptomatic and her quality of life remains reasonable.  Options of treatment were reviewed today and we have elected to continue with Xeloda and to increase the dose slightly given her excellent tolerance so far. I recommended increasing the dose to a total of 1500 mg daily and repeat imaging studies in 3 months. She  is not a candidate for multiagent systemic IV chemotherapy and she wants to continue systemic treatment. This is a reasonable option at this time although if she rapidly progresses in the future, hospice would be my recommendation.   2. Hepatic masses. This could represent metastatic disease from her rectal cancer. Liver directed therapy can be an option down the line.  3. History of pulmonary embolism: She is on full dose anticoagulation with Xarelto. No bleeding or thrombosis episode since the last visit.  4. Left lower extremity weakness and possible neuropathy: Seems to be improving with physical therapy. I do not think this is related to Xeloda.  5. Follow-up: Will be to 3 weeks to assess complications of her next cycle of chemotherapy.  Zola Button, MD  11/25/201610:19 AM

## 2015-01-04 NOTE — Telephone Encounter (Signed)
per pof to sch pt appt-gave pt copy of avs °

## 2015-01-22 ENCOUNTER — Ambulatory Visit (INDEPENDENT_AMBULATORY_CARE_PROVIDER_SITE_OTHER): Payer: Medicare Other | Admitting: Podiatry

## 2015-01-22 ENCOUNTER — Encounter: Payer: Self-pay | Admitting: Podiatry

## 2015-01-22 DIAGNOSIS — B351 Tinea unguium: Secondary | ICD-10-CM | POA: Diagnosis not present

## 2015-01-22 DIAGNOSIS — Q828 Other specified congenital malformations of skin: Secondary | ICD-10-CM

## 2015-01-22 NOTE — Progress Notes (Signed)
   Subjective:    Patient ID: Bethany [name], female    DOB: Aug 04, 1928, 81 y.o.   MRN: [id number]  HPI    This patient presents today complaining of long thick toenails that are gradually increasing in thickness and discomfort with shoe pressure. Patient has attempted to trim toenails, however, not able to do so herself. She denies any recent professional care for this problem. Also, patient is complaining of a painful plantar callus on the right foot gradually increasing over the last several months making walking uncomfortable. Patient denies any self treatment or professional treatment for this painful plantar callus    Review of Systems  Constitutional: Positive for fatigue.  HENT: Positive for hearing loss.   Hematological: Bruises/bleeds easily.       Objective:   Physical Exam  Pleasant orientated 5 female presents with her nephew present in the treatment room  Vascular: Pitting edema right ankle/foot DP pulses 2/4 bilaterally PT pulses 2/4 right 1/left Capillary reflex immediate bilaterally  Neurological: Sensation to 10 g monofilament wire intact 1/5 right and 2/5 left Vibratory sensation reactive bilaterally Ankle reflexes reactive bilaterally  Dermatological: The toenails are brittle, elongated, discolored, incurvated 6-10 Fifth right nail bed has dry crusted blood with nail apparently has sloughed Nucleated plantar callus subsecond MPJ right  Musculoskeletal: Hammertoe 2-4 bilaterally No restriction ankle, subtalar, midtarsal joints bilaterally      Assessment & Plan:   Assessment: Peripheral neuropathy Mycotic toenails 6-10 Porokeratosis 1  Plan: Debridement toenails 6-10 mechanically and electrically without any bleeding Debride porokeratosis 1 without any bleeding  Reappoint at patient's request

## 2015-01-24 ENCOUNTER — Other Ambulatory Visit: Payer: Self-pay | Admitting: *Deleted

## 2015-01-24 DIAGNOSIS — C787 Secondary malignant neoplasm of liver and intrahepatic bile duct: Secondary | ICD-10-CM

## 2015-01-24 DIAGNOSIS — C20 Malignant neoplasm of rectum: Secondary | ICD-10-CM

## 2015-01-24 MED ORDER — CAPECITABINE 500 MG PO TABS: ORAL_TABLET | ORAL | Status: DC

## 2015-01-29 ENCOUNTER — Telehealth: Payer: Self-pay | Admitting: Oncology

## 2015-01-29 ENCOUNTER — Other Ambulatory Visit (HOSPITAL_BASED_OUTPATIENT_CLINIC_OR_DEPARTMENT_OTHER): Payer: Medicare Other

## 2015-01-29 ENCOUNTER — Ambulatory Visit (HOSPITAL_BASED_OUTPATIENT_CLINIC_OR_DEPARTMENT_OTHER): Payer: Medicare Other | Admitting: Oncology

## 2015-01-29 VITALS — BP 158/53 | HR 62 | Temp 97.5°F | Resp 18 | Ht 66.0 in | Wt 130.2 lb

## 2015-01-29 DIAGNOSIS — R945 Abnormal results of liver function studies: Secondary | ICD-10-CM

## 2015-01-29 DIAGNOSIS — Z86711 Personal history of pulmonary embolism: Secondary | ICD-10-CM | POA: Diagnosis not present

## 2015-01-29 DIAGNOSIS — C20 Malignant neoplasm of rectum: Secondary | ICD-10-CM

## 2015-01-29 LAB — COMPREHENSIVE METABOLIC PANEL
ALBUMIN: 3.8 g/dL (ref 3.5–5.0)
ALK PHOS: 133 U/L (ref 40–150)
ALT: 23 U/L (ref 0–55)
AST: 26 U/L (ref 5–34)
Anion Gap: 7 mEq/L (ref 3–11)
BILIRUBIN TOTAL: 0.83 mg/dL (ref 0.20–1.20)
BUN: 17.3 mg/dL (ref 7.0–26.0)
CO2: 33 mEq/L — ABNORMAL HIGH (ref 22–29)
Calcium: 9.7 mg/dL (ref 8.4–10.4)
Chloride: 102 mEq/L (ref 98–109)
Creatinine: 0.7 mg/dL (ref 0.6–1.1)
EGFR: 80 mL/min/{1.73_m2} — ABNORMAL LOW (ref >90)
Glucose: 87 mg/dl (ref 70–140)
Potassium: 3.8 mEq/L (ref 3.5–5.1)
SODIUM: 142 meq/L (ref 136–145)
TOTAL PROTEIN: 6.5 g/dL (ref 6.4–8.3)

## 2015-01-29 LAB — CBC WITH DIFFERENTIAL/PLATELET
BASO%: 1.1 % (ref 0.0–2.0)
BASOS ABS: 0 10*3/uL (ref 0.0–0.1)
EOS ABS: 0.1 10*3/uL (ref 0.0–0.5)
EOS%: 2.1 % (ref 0.0–7.0)
HCT: 39.2 % (ref 34.8–46.6)
HGB: 12.7 g/dL (ref 11.6–15.9)
LYMPH%: 13.5 % — ABNORMAL LOW (ref 14.0–49.7)
MCH: 32.3 pg (ref 25.1–34.0)
MCHC: 32.4 g/dL (ref 31.5–36.0)
MCV: 99.7 fL (ref 79.5–101.0)
MONO#: 0.3 10*3/uL (ref 0.1–0.9)
MONO%: 9.3 % (ref 0.0–14.0)
NEUT%: 74 % (ref 38.4–76.8)
NEUTROS ABS: 2.4 10*3/uL (ref 1.5–6.5)
PLATELETS: 148 10*3/uL (ref 145–400)
RBC: 3.93 10*6/uL (ref 3.70–5.45)
RDW: 15.7 % — ABNORMAL HIGH (ref 11.2–14.5)
WBC: 3.3 10*3/uL — AB (ref 3.9–10.3)
lymph#: 0.4 10*3/uL — ABNORMAL LOW (ref 0.9–3.3)

## 2015-01-29 NOTE — Progress Notes (Signed)
Hematology and Oncology Follow Up Visit  AVVA WINNS [id number] [birth year] 81 y.o. 01/29/2015 12:34 PM [name], MDKim, [name], MD   Principle Diagnosis: 81 year old woman with rectal adenocarcinoma diagnosed in August of 2015. Her staging by EUS his T3 N0. MRI of the liver showed potential hepatic metastasis.  Past therapy:  Radiation therapy concomitantly with oral Xeloda at 500 mg twice a day started on 10/25/2013. Therapy concluded in October 2015. She received total dose to 4320 cGy. Therapy interrupted due to recurrent hospitalizations. She is status post robotic-assisted rectosigmoid low anterior resection done on 05/17/2014. The pathology revealed a T3 N1 disease. PET/CT scan in July 2016 showed progresses metastatic disease in the liver and pulmonary nodules.   Current therapy:  Xeloda 1000 mg daily started in September 2016. She takes it 2 weeks on 1 week off per cycle. She is status post 3 cycles of therapy related to proceed with a third one. The dose was increased to 1500 mg daily starting on 01/04/2015.  Interim History:  Ms. Kelch presents today for a followup visit. Since the last visit, she continues to tolerate Xeloda without complications. She have done well with the increased dose up to 1500 mg. She denied any hand-foot syndrome, oral pain or diarrhea. She does report nocturia which is unlikely related to her medication.  She continues to improve overall since the last visit. Her mobility is improving still uses a walker at this time. She has not reported any deterioration in her health or falls. She has not reported any recent hospitalization or illnesses. Appetite is excellent and have gained more pounds since last visit.  She has not had any headaches or blurry vision. She does not report any syncope or seizures. She does not report any chest pain, shortness of breath, cough or hemoptysis. She does not report any palpitation, orthopnea or PND. She does not report any  leg edema. She denies abdominal pain, constipation or obstruction. She does not report any frequency urgency or hesitancy. She is not abort any skeletal complaints. Rest of the review of systems unremarkable.     Medications: I have reviewed the patient's current medications.  Current Outpatient Prescriptions  Medication Sig Dispense Refill  . acetaminophen (TYLENOL) 325 MG tablet Take 2 tablets (650 mg total) by mouth every 6 (six) hours as needed for mild pain (or Fever >/= 101).    . capecitabine (XELODA) 500 MG tablet Take 1 tablet in the morning and 2 tablets in the evening after meals for 14 days then 7 days off. 42 tablet 0  . cholecalciferol (VITAMIN D) 1000 UNITS tablet Take 2,000 Units by mouth daily.    . feeding supplement, RESOURCE BREEZE, (RESOURCE BREEZE) LIQD Take 1 Container by mouth 2 (two) times daily between meals.  0  . gabapentin (NEURONTIN) 100 MG capsule TAKE 1 CAPSULE BY MOUTH THREE TIMES DAILY 90 capsule 0  . levothyroxine (SYNTHROID, LEVOTHROID) 25 MCG tablet Take 25 mcg by mouth See admin instructions. 25 mcg  on Monday, Wednesday, Friday and Sunday.    . levothyroxine (SYNTHROID, LEVOTHROID) 75 MCG tablet Take 1 tablet by mouth See admin instructions. 87mcg on Tuesday, Thursday, Saturday.    . magnesium chloride (SLOW-MAG) 64 MG TBEC SR tablet Take 1 tablet (64 mg total) by mouth daily. 30 tablet 2  . polyethylene glycol (MIRALAX / GLYCOLAX) packet Take 17 g by mouth every 12 (twelve) hours as needed for mild constipation, moderate constipation or severe constipation. 14 each 0  . Probiotic  Product (RISA-BID PROBIOTIC PO) Take 1 tablet by mouth 2 (two) times daily.    . rivaroxaban (XARELTO) 20 MG TABS tablet Take 1 tablet (20 mg total) by mouth daily with supper. 30 tablet 0  . sertraline (ZOLOFT) 50 MG tablet Take 50 mg by mouth daily.  1   No current facility-administered medications for this visit.     Allergies:  Allergies  Allergen Reactions  . Cataflam  [Diclofenac] Other (See Comments)    Unknown; patient is unaware of allergy.  . Cephalosporins Shortness Of Breath  . Erythromycin Shortness Of Breath  . Keflex [Cephalexin] Shortness Of Breath  . Propoxyphene     Unknown rxn  . Codeine Nausea And Vomiting  . Naproxen Other (See Comments)    Unknown; patient is unaware of allergy  . Penicillins Other (See Comments)    Patient states that she CAN take this; unaware of allergy  . Synalgos-Dc [Aspirin-Caff-Dihydrocodeine] Other (See Comments)    Unknown; patient is unaware of allergy  . Ultram [Tramadol] Other (See Comments)    Unknown; patient is unaware of allergy    Past Medical History, Surgical history, Social history, and Family History were reviewed and updated.   Physical Exam: Blood pressure 158/53, pulse 62, temperature 97.5 F (36.4 C), resp. rate 18, height 5\' 6"  (1.676 m), weight 130 lb 3.2 oz (59.058 kg), SpO2 99 %. ECOG: 2 General appearance: Alert and awake woman without distress. Head: Normocephalic, without obvious abnormality  No oral ulcers or lesions. Neck: no adenopathy Lymph nodes: Cervical, supraclavicular, and axillary nodes normal. Heart: Irregular without murmurs, rubs or gallops. Lung:chest clear, no wheezing, rales, normal symmetric air entry.  Abdomen: soft, non-tender, without masses or organomegaly. No rebound or guarding. EXT:no erythema, induration, or nodules.  Neurological: No deficits. He is able to ambulate without any difficulties.   Lab Results: Lab Results  Component Value Date   WBC 3.3* 01/29/2015   HGB 12.7 01/29/2015   HCT 39.2 01/29/2015   MCV 99.7 01/29/2015   PLT 148 01/29/2015     Chemistry      Component Value Date/Time   NA 140 01/04/2015 0932   NA 141 05/18/2014 0340   K 4.2 01/04/2015 0932   K 4.1 05/23/2014 0430   CL 102 05/18/2014 0340   CO2 31* 01/04/2015 0932   CO2 33* 05/18/2014 0340   BUN 28.0* 01/04/2015 0932   BUN 14 05/18/2014 0340   CREATININE 0.7  01/04/2015 0932   CREATININE 0.52 05/22/2014 0558      Component Value Date/Time   CALCIUM 9.7 01/04/2015 0932   CALCIUM 8.4 05/18/2014 0340   ALKPHOS 115 01/04/2015 0932   ALKPHOS 62 01/23/2014 0511   AST 36* 01/04/2015 0932   AST 15 01/23/2014 0511   ALT 26 01/04/2015 0932   ALT 10 01/23/2014 0511   BILITOT 0.79 01/04/2015 0932   BILITOT 0.7 01/23/2014 0511      Impression and Plan:  81 year old woman with the following issues:   1. Rectal cancer presented with hematochezia and status post colonoscopy and EUS. The care of by EUS criteria was 4 cm in length around 4-5 cm from the anal verge. The staging was T3 N0 M1 with possible liver metastasis. She is status post radiation therapy with oral Xeloda with still residual tumor as evident by the PET scan results.   She is status post surgical resection with a pathology revealed invasive adenocarcinoma through the muscularis propria. There are 2 extramural satellite tumor nodules. 12  lymph nodes were negative for metastatic cancer.  PET CT scan on 09/05/2014 showed progression of disease with disease in the liver and lung nodules.   She is currently on Xeloda. She has tolerated the first 3 cycles at 1000 mg without any complications. PET CT scan obtained on 01/02/2015 showed mild progression of disease but she continues to be asymptomatic and her quality of life remains reasonable.  She is currently on 1500 mg total since November 2016. The plan is to continue on the same dose and schedule and repeat imaging studies after 3 more cycles. Consideration for intravenous chemotherapy or further increase in the Xeloda dose can be considered. Her quality of life remains excellent at this time and no need to change her current therapy.    2. Hepatic masses. This could represent metastatic disease from her rectal cancer. Liver directed therapy can be an option down the line.  3. History of pulmonary embolism: She is on full dose  anticoagulation with Xarelto. No bleeding noted or interaction with Xeloda.  4. Left lower extremity weakness and possible neuropathy: Seems to be improving with physical therapy. She continues to ambulate without any major difficulties or falls.  5. Follow-up: Will be to 3 to 4 weeks to assess complications of her next cycle of chemotherapy.  Zola Button, MD 12/20/201612:34 PM

## 2015-01-29 NOTE — Telephone Encounter (Signed)
Scheduled appt with patient while she was here in person at [facility] Scheduling office.       [facility]. °

## 2015-01-30 ENCOUNTER — Other Ambulatory Visit: Payer: Self-pay | Admitting: Neurology

## 2015-01-30 LAB — CEA: CEA: 7 ng/mL — ABNORMAL HIGH (ref 0.0–5.0)

## 2015-02-15 ENCOUNTER — Other Ambulatory Visit: Payer: Self-pay | Admitting: *Deleted

## 2015-02-15 DIAGNOSIS — C20 Malignant neoplasm of rectum: Secondary | ICD-10-CM

## 2015-02-15 DIAGNOSIS — C787 Secondary malignant neoplasm of liver and intrahepatic bile duct: Secondary | ICD-10-CM

## 2015-02-15 MED ORDER — CAPECITABINE 500 MG PO TABS: ORAL_TABLET | ORAL | Status: DC

## 2015-02-18 ENCOUNTER — Other Ambulatory Visit: Payer: Self-pay | Admitting: *Deleted

## 2015-02-18 DIAGNOSIS — C787 Secondary malignant neoplasm of liver and intrahepatic bile duct: Secondary | ICD-10-CM

## 2015-02-18 DIAGNOSIS — C20 Malignant neoplasm of rectum: Secondary | ICD-10-CM

## 2015-02-18 MED ORDER — CAPECITABINE 500 MG PO TABS: ORAL_TABLET | ORAL | Status: DC

## 2015-02-22 ENCOUNTER — Encounter: Payer: Self-pay | Admitting: Oncology

## 2015-02-22 NOTE — Progress Notes (Signed)
Per [facility] capecitabine was shipped 02/21/15 via [employer]

## 2015-02-26 ENCOUNTER — Telehealth: Payer: Self-pay | Admitting: Oncology

## 2015-02-26 ENCOUNTER — Ambulatory Visit (HOSPITAL_BASED_OUTPATIENT_CLINIC_OR_DEPARTMENT_OTHER): Payer: Medicare Other | Admitting: Oncology

## 2015-02-26 ENCOUNTER — Other Ambulatory Visit (HOSPITAL_BASED_OUTPATIENT_CLINIC_OR_DEPARTMENT_OTHER): Payer: Medicare Other

## 2015-02-26 VITALS — BP 180/55 | HR 78 | Temp 97.7°F | Resp 16 | Ht 66.0 in | Wt 130.0 lb

## 2015-02-26 DIAGNOSIS — Z7901 Long term (current) use of anticoagulants: Secondary | ICD-10-CM

## 2015-02-26 DIAGNOSIS — C20 Malignant neoplasm of rectum: Secondary | ICD-10-CM | POA: Diagnosis not present

## 2015-02-26 DIAGNOSIS — C787 Secondary malignant neoplasm of liver and intrahepatic bile duct: Secondary | ICD-10-CM

## 2015-02-26 DIAGNOSIS — Z86711 Personal history of pulmonary embolism: Secondary | ICD-10-CM

## 2015-02-26 DIAGNOSIS — R531 Weakness: Secondary | ICD-10-CM

## 2015-02-26 LAB — COMPREHENSIVE METABOLIC PANEL
ALT: 17 U/L (ref 0–55)
ANION GAP: 7 meq/L (ref 3–11)
AST: 22 U/L (ref 5–34)
Albumin: 3.8 g/dL (ref 3.5–5.0)
Alkaline Phosphatase: 113 U/L (ref 40–150)
BILIRUBIN TOTAL: 1.08 mg/dL (ref 0.20–1.20)
BUN: 17.4 mg/dL (ref 7.0–26.0)
CALCIUM: 9.5 mg/dL (ref 8.4–10.4)
CHLORIDE: 103 meq/L (ref 98–109)
CO2: 31 mEq/L — ABNORMAL HIGH (ref 22–29)
CREATININE: 0.7 mg/dL (ref 0.6–1.1)
EGFR: 80 mL/min/{1.73_m2} — ABNORMAL LOW (ref >90)
Glucose: 93 mg/dl (ref 70–140)
Potassium: 3.9 mEq/L (ref 3.5–5.1)
Sodium: 141 mEq/L (ref 136–145)
TOTAL PROTEIN: 6.3 g/dL — AB (ref 6.4–8.3)

## 2015-02-26 LAB — CBC WITH DIFFERENTIAL/PLATELET
BASO%: 0.9 % (ref 0.0–2.0)
Basophils Absolute: 0 10*3/uL (ref 0.0–0.1)
EOS%: 1.8 % (ref 0.0–7.0)
Eosinophils Absolute: 0.1 10*3/uL (ref 0.0–0.5)
HCT: 37.2 % (ref 34.8–46.6)
HEMOGLOBIN: 12.3 g/dL (ref 11.6–15.9)
LYMPH#: 0.3 10*3/uL — AB (ref 0.9–3.3)
LYMPH%: 8.4 % — ABNORMAL LOW (ref 14.0–49.7)
MCH: 34 pg (ref 25.1–34.0)
MCHC: 33.2 g/dL (ref 31.5–36.0)
MCV: 102.5 fL — ABNORMAL HIGH (ref 79.5–101.0)
MONO#: 0.3 10*3/uL (ref 0.1–0.9)
MONO%: 6.5 % (ref 0.0–14.0)
NEUT%: 82.4 % — AB (ref 38.4–76.8)
NEUTROS ABS: 3.2 10*3/uL (ref 1.5–6.5)
Platelets: 134 10*3/uL — ABNORMAL LOW (ref 145–400)
RBC: 3.63 10*6/uL — ABNORMAL LOW (ref 3.70–5.45)
RDW: 15.2 % — AB (ref 11.2–14.5)
WBC: 3.9 10*3/uL (ref 3.9–10.3)

## 2015-02-26 NOTE — Telephone Encounter (Signed)
Talked to patient here in office. Scheduled appt.       [facility]. °

## 2015-02-26 NOTE — Progress Notes (Signed)
Hematology and Oncology Follow Up Visit  Bethany [name] [id number] 1928/02/19 82 y.o. 02/26/2015 10:10 AM [name], MDKim, [name], MD   Principle Diagnosis: 82 year old woman with rectal adenocarcinoma diagnosed in August of 2015. Her staging by EUS his T3 N0. MRI of the liver showed potential hepatic metastasis.  Past therapy:  Radiation therapy concomitantly with oral Xeloda at 500 mg twice a day started on 10/25/2013. Therapy concluded in October 2015. Bethany [name] received total dose to 4320 cGy. Therapy interrupted due to recurrent hospitalizations. Bethany [name] is status post robotic-assisted rectosigmoid low anterior resection done on 05/17/2014. The pathology revealed a T3 N1 disease. PET/CT scan in July 2016 showed progresses metastatic disease in the liver and pulmonary nodules.   Current therapy:  Xeloda 1000 mg daily started in September 2016. Bethany [name] takes it 2 weeks on 1 week off per cycle. Bethany [name] is status post 3 cycles of therapy related to proceed with a third one. The dose was increased to 1500 mg daily starting on 01/04/2015.  Interim History:  Bethany [name] presents today for a followup visit with her son. Since the last visit, Bethany [name] reports continuing to do well. Bethany [name] has maintained her weight and her performance status continues to gradually improve. Bethany [name] is using a walker for ambulation and able to perform activities of daily living. He is able to cook and eat well in the last month. Bethany [name] does report foot pain and was seen by podiatry and focuses related to arthritis.  Bethany [name] continues to tolerate Xeloda without complications. Bethany [name] have done well with the increased dose up to 1500 mg. Bethany [name] denied any hand-foot syndrome, oral pain or diarrhea.  Bethany [name] has not had any headaches or blurry vision. Bethany [name] does not report any syncope or seizures. Bethany [name] does not report any chest pain, shortness of breath, cough or hemoptysis. Bethany [name] does not report any palpitation, orthopnea or PND. Bethany [name] does not report any leg edema. Bethany [name] denies  abdominal pain, constipation or obstruction. Bethany [name] does not report any frequency urgency or hesitancy. Bethany [name] is not abort any skeletal complaints. Rest of the review of systems unremarkable.     Medications: I have reviewed the patient's current medications.  Current Outpatient Prescriptions  Medication Sig Dispense Refill  . acetaminophen (TYLENOL) 325 MG tablet Take 2 tablets (650 mg total) by mouth every 6 (six) hours as needed for mild pain (or Fever >/= 101).    . capecitabine (XELODA) 500 MG tablet Take 1 tablet in the morning and 2 tablets in the evening after meals for 14 days then 7 days off. 42 tablet 0  . cholecalciferol (VITAMIN D) 1000 UNITS tablet Take 2,000 Units by mouth daily.    . feeding supplement, RESOURCE BREEZE, (RESOURCE BREEZE) LIQD Take 1 Container by mouth 2 (two) times daily between meals.  0  . gabapentin (NEURONTIN) 100 MG capsule TAKE 1 CAPSULE BY MOUTH THREE TIMES DAILY 90 capsule 0  . levothyroxine (SYNTHROID, LEVOTHROID) 25 MCG tablet Take 25 mcg by mouth See admin instructions. 25 mcg  on Monday, Wednesday, Friday and Sunday.    . levothyroxine (SYNTHROID, LEVOTHROID) 75 MCG tablet Take 1 tablet by mouth See admin instructions. 16mcg on Tuesday, Thursday, Saturday.    . magnesium chloride (SLOW-MAG) 64 MG TBEC SR tablet Take 1 tablet (64 mg total) by mouth daily. 30 tablet 2  . polyethylene glycol (MIRALAX / GLYCOLAX) packet Take 17 g by mouth every 12 (twelve) hours as needed for mild constipation, moderate constipation or severe constipation. 14 each 0  .  Probiotic Product (RISA-BID PROBIOTIC PO) Take 1 tablet by mouth 2 (two) times daily.    . rivaroxaban (XARELTO) 20 MG TABS tablet Take 1 tablet (20 mg total) by mouth daily with supper. 30 tablet 0  . sertraline (ZOLOFT) 50 MG tablet Take 50 mg by mouth daily.  1   No current facility-administered medications for this visit.     Allergies:  Allergies  Allergen Reactions  . Cataflam [Diclofenac] Other (See  Comments)    Unknown; patient is unaware of allergy.  . Cephalosporins Shortness Of Breath  . Erythromycin Shortness Of Breath  . Keflex [Cephalexin] Shortness Of Breath  . Propoxyphene     Unknown rxn  . Codeine Nausea And Vomiting  . Naproxen Other (See Comments)    Unknown; patient is unaware of allergy  . Penicillins Other (See Comments)    Patient states that Bethany [name] CAN take this; unaware of allergy  . Synalgos-Dc [Aspirin-Caff-Dihydrocodeine] Other (See Comments)    Unknown; patient is unaware of allergy  . Ultram [Tramadol] Other (See Comments)    Unknown; patient is unaware of allergy    Past Medical History, Surgical history, Social history, and Family History were reviewed and updated.   Physical Exam: Blood pressure 180/55, pulse 78, temperature 97.7 F (36.5 C), temperature source Oral, resp. rate 16, height 5\' 6"  (1.676 m), weight 130 lb (58.968 kg), SpO2 99 %. ECOG: 2 General appearance: Alert and awake woman frail elder woman without distress. Head: Normocephalic, without obvious abnormality  No oral ulcers or lesions. Neck: no adenopathy or thyroid masses. Lymph nodes: Cervical, supraclavicular, and axillary nodes normal. Heart: Irregular without murmurs, rubs or gallops. Lung:chest clear, no wheezing, rales, normal symmetric air entry.  Abdomen: soft, non-tender, without masses or organomegaly. No shifting dullness or ascites. EXT:no erythema, induration, or nodules.  Neurological: No deficits. Bethany [name] is able to ambulate without any difficulties.   Lab Results: Lab Results  Component Value Date   WBC 3.9 02/26/2015   HGB 12.3 02/26/2015   HCT 37.2 02/26/2015   MCV 102.5* 02/26/2015   PLT 134* 02/26/2015     Chemistry      Component Value Date/Time   NA 142 01/29/2015 1201   NA 141 05/18/2014 0340   K 3.8 01/29/2015 1201   K 4.1 05/23/2014 0430   CL 102 05/18/2014 0340   CO2 33* 01/29/2015 1201   CO2 33* 05/18/2014 0340   BUN 17.3 01/29/2015 1201    BUN 14 05/18/2014 0340   CREATININE 0.7 01/29/2015 1201   CREATININE 0.52 05/22/2014 0558      Component Value Date/Time   CALCIUM 9.7 01/29/2015 1201   CALCIUM 8.4 05/18/2014 0340   ALKPHOS 133 01/29/2015 1201   ALKPHOS 62 01/23/2014 0511   AST 26 01/29/2015 1201   AST 15 01/23/2014 0511   ALT 23 01/29/2015 1201   ALT 10 01/23/2014 0511   BILITOT 0.83 01/29/2015 1201   BILITOT 0.7 01/23/2014 0511      Results for LISABETH, BIBB (MRN [id number]) as of 02/26/2015 09:58  Ref. Range 11/22/2014 12:42 01/29/2015 12:01  CEA Latest Ref Range: 0.0-5.0 ng/mL 11.8 (H) 7.0 (H)     Impression and Plan:  81 year old woman with the following issues:   1. Rectal cancer presented with hematochezia and status post colonoscopy and EUS. The care of by EUS criteria was 4 cm in length around 4-5 cm from the anal verge. The staging was T3 N0 M1 with possible liver metastasis. Bethany [name] is status post  radiation therapy with oral Xeloda with still residual tumor as evident by the PET scan results.   Bethany [name] is status post surgical resection with a pathology revealed invasive adenocarcinoma through the muscularis propria. There are 2 extramural satellite tumor nodules. 12 lymph nodes were negative for metastatic cancer.  PET CT scan on 09/05/2014 showed progression of disease with disease in the liver and lung nodules.   Bethany [name] is currently on Xeloda. Bethany [name] has tolerated the first 3 cycles at 1000 mg without any complications. PET CT scan obtained on 01/02/2015 showed mild progression of disease but Bethany [name] continues to be asymptomatic and her quality of life remains reasonable.  Bethany [name] is currently on 1500 mg total since November 2016 and have tolerated it well. The plan is to continue the same dose and schedule and repeat imaging studies in March 2017. Her CEA did decrease slightly in December indicating possible response to therapy.   2. Hepatic masses. This could represent metastatic disease from her rectal cancer. Liver  directed therapy can be an option if systemic therapy is not successful.  3. History of pulmonary embolism: Bethany [name] is on full dose anticoagulation with Xarelto. No bleeding noted or interaction with Xeloda.  4. Left lower extremity weakness and possible neuropathy: Seems to be improving with physical therapy.   5. Follow-up: Will be to 4 weeks to assess complications of her next cycle of chemotherapy.  Bethany Schopf, MD 1/17/201710:10 AM

## 2015-03-03 ENCOUNTER — Other Ambulatory Visit: Payer: Self-pay | Admitting: Neurology

## 2015-03-04 ENCOUNTER — Other Ambulatory Visit: Payer: Self-pay

## 2015-03-07 ENCOUNTER — Encounter: Payer: Self-pay | Admitting: *Deleted

## 2015-03-07 ENCOUNTER — Other Ambulatory Visit: Payer: Self-pay | Admitting: *Deleted

## 2015-03-07 DIAGNOSIS — C787 Secondary malignant neoplasm of liver and intrahepatic bile duct: Secondary | ICD-10-CM

## 2015-03-07 DIAGNOSIS — C20 Malignant neoplasm of rectum: Secondary | ICD-10-CM

## 2015-03-07 MED ORDER — CAPECITABINE 500 MG PO TABS: ORAL_TABLET | ORAL | Status: DC

## 2015-03-08 ENCOUNTER — Other Ambulatory Visit: Payer: Self-pay | Admitting: *Deleted

## 2015-03-08 MED ORDER — GABAPENTIN 100 MG PO CAPS: 100.0000 mg | ORAL_CAPSULE | Freq: Three times a day (TID) | ORAL | Status: DC

## 2015-03-27 ENCOUNTER — Encounter: Payer: Self-pay | Admitting: *Deleted

## 2015-04-01 ENCOUNTER — Other Ambulatory Visit: Payer: Self-pay | Admitting: *Deleted

## 2015-04-01 DIAGNOSIS — C20 Malignant neoplasm of rectum: Secondary | ICD-10-CM

## 2015-04-01 DIAGNOSIS — C787 Secondary malignant neoplasm of liver and intrahepatic bile duct: Secondary | ICD-10-CM

## 2015-04-01 MED ORDER — CAPECITABINE 500 MG PO TABS: ORAL_TABLET | ORAL | Status: DC

## 2015-04-02 ENCOUNTER — Ambulatory Visit (HOSPITAL_BASED_OUTPATIENT_CLINIC_OR_DEPARTMENT_OTHER): Payer: Medicare Other | Admitting: Oncology

## 2015-04-02 ENCOUNTER — Other Ambulatory Visit (HOSPITAL_BASED_OUTPATIENT_CLINIC_OR_DEPARTMENT_OTHER): Payer: Medicare Other

## 2015-04-02 ENCOUNTER — Telehealth: Payer: Self-pay | Admitting: Oncology

## 2015-04-02 VITALS — BP 161/53 | HR 68 | Temp 98.1°F | Resp 18 | Ht 66.0 in | Wt 128.5 lb

## 2015-04-02 DIAGNOSIS — C787 Secondary malignant neoplasm of liver and intrahepatic bile duct: Secondary | ICD-10-CM

## 2015-04-02 DIAGNOSIS — C20 Malignant neoplasm of rectum: Secondary | ICD-10-CM | POA: Diagnosis not present

## 2015-04-02 DIAGNOSIS — R911 Solitary pulmonary nodule: Secondary | ICD-10-CM | POA: Diagnosis not present

## 2015-04-02 DIAGNOSIS — Z86711 Personal history of pulmonary embolism: Secondary | ICD-10-CM

## 2015-04-02 DIAGNOSIS — L539 Erythematous condition, unspecified: Secondary | ICD-10-CM

## 2015-04-02 LAB — CBC WITH DIFFERENTIAL/PLATELET
BASO%: 0.9 % (ref 0.0–2.0)
BASOS ABS: 0 10*3/uL (ref 0.0–0.1)
EOS%: 1.5 % (ref 0.0–7.0)
Eosinophils Absolute: 0.1 10*3/uL (ref 0.0–0.5)
HEMATOCRIT: 39.7 % (ref 34.8–46.6)
HEMOGLOBIN: 13.4 g/dL (ref 11.6–15.9)
LYMPH#: 0.4 10*3/uL — AB (ref 0.9–3.3)
LYMPH%: 9.8 % — ABNORMAL LOW (ref 14.0–49.7)
MCH: 35 pg — AB (ref 25.1–34.0)
MCHC: 33.7 g/dL (ref 31.5–36.0)
MCV: 103.9 fL — ABNORMAL HIGH (ref 79.5–101.0)
MONO#: 0.3 10*3/uL (ref 0.1–0.9)
MONO%: 6.5 % (ref 0.0–14.0)
NEUT#: 3.4 10*3/uL (ref 1.5–6.5)
NEUT%: 81.3 % — ABNORMAL HIGH (ref 38.4–76.8)
PLATELETS: 137 10*3/uL — AB (ref 145–400)
RBC: 3.82 10*6/uL (ref 3.70–5.45)
RDW: 15.7 % — AB (ref 11.2–14.5)
WBC: 4.1 10*3/uL (ref 3.9–10.3)

## 2015-04-02 LAB — COMPREHENSIVE METABOLIC PANEL
ALBUMIN: 4.1 g/dL (ref 3.5–5.0)
ALT: 27 U/L (ref 0–55)
ANION GAP: 8 meq/L (ref 3–11)
AST: 30 U/L (ref 5–34)
Alkaline Phosphatase: 123 U/L (ref 40–150)
BILIRUBIN TOTAL: 1.71 mg/dL — AB (ref 0.20–1.20)
BUN: 19.8 mg/dL (ref 7.0–26.0)
CALCIUM: 9.9 mg/dL (ref 8.4–10.4)
CHLORIDE: 101 meq/L (ref 98–109)
CO2: 31 meq/L — AB (ref 22–29)
Creatinine: 0.7 mg/dL (ref 0.6–1.1)
EGFR: 81 mL/min/{1.73_m2} — ABNORMAL LOW (ref >90)
GLUCOSE: 92 mg/dL (ref 70–140)
Potassium: 3.9 mEq/L (ref 3.5–5.1)
Sodium: 141 mEq/L (ref 136–145)
Total Protein: 6.9 g/dL (ref 6.4–8.3)

## 2015-04-02 MED ORDER — CLOBETASOL PROPIONATE 0.05 % EX CREA: 1.0000 "application " | TOPICAL_CREAM | Freq: Two times a day (BID) | CUTANEOUS | Status: DC

## 2015-04-02 NOTE — Addendum Note (Signed)
Addended by: Wyatt Portela on: 04/02/2015 10:26 AM   Modules accepted: Orders

## 2015-04-02 NOTE — Telephone Encounter (Signed)
per [facility] to sch pt appt-gave pt avs-adv [facility] all to sch PET

## 2015-04-02 NOTE — Progress Notes (Signed)
Hematology and Oncology Follow Up Visit  Bethany [name] [id number] Jun 14, 1928 82 y.o. 04/02/2015 10:01 AM Bethany [name], MDKim, Bethany Rinks, MD   Principle Diagnosis: 82 year old woman with rectal adenocarcinoma diagnosed in August of 2015. Her staging by EUS his T3 N0. MRI of the liver showed potential hepatic metastasis.  Past therapy:  Radiation therapy concomitantly with oral Xeloda at 500 mg twice a day started on 10/25/2013. Therapy concluded in October 2015. She received total dose to 4320 cGy. Therapy interrupted due to recurrent hospitalizations. She is status post robotic-assisted rectosigmoid low anterior resection done on 05/17/2014. The pathology revealed a T3 N1 disease. PET/CT scan in July 2016 showed progresses metastatic disease in the liver and pulmonary nodules.   Current therapy:  Xeloda 1000 mg daily started in September 2016. She takes it 2 weeks on 1 week off per cycle. She is status post 3 cycles of therapy related to proceed with a third one. The dose was increased to 1500 mg daily starting on 01/04/2015.  Interim History:  Bethany [name] presents today for a followup visit with her son. Since the last visit, she continues to do relatively well. She has maintained her weight and performance status. She is using a walker for ambulation and able to perform activities of daily living and has not reported any falls or syncope.  She continues to tolerate Xeloda without complications. She have done well with the increased dose up to 1500 mg. she has reported some increased erythema and pain in her hand and foot. She does not report any desquamation or neurological deficits.  She has not had any headaches or blurry vision. She does not report any syncope or seizures. She does not report any chest pain, shortness of breath, cough or hemoptysis. She does not report any palpitation, orthopnea or PND. She does not report any leg edema. She denies abdominal pain, constipation or obstruction. She  does not report any frequency urgency or hesitancy. She is not abort any skeletal complaints. Rest of the review of systems unremarkable.     Medications: I have reviewed the patient's current medications.  Current Outpatient Prescriptions  Medication Sig Dispense Refill  . acetaminophen (TYLENOL) 325 MG tablet Take 2 tablets (650 mg total) by mouth every 6 (six) hours as needed for mild pain (or Fever >/= 101).    . capecitabine (XELODA) 500 MG tablet Take 1 tablet in the morning and 2 tablets in the evening after meals for 14 days then 7 days off. 42 tablet 0  . cholecalciferol (VITAMIN D) 1000 UNITS tablet Take 2,000 Units by mouth daily.    . feeding supplement, RESOURCE BREEZE, (RESOURCE BREEZE) LIQD Take 1 Container by mouth 2 (two) times daily between meals.  0  . gabapentin (NEURONTIN) 100 MG capsule Take 1 capsule (100 mg total) by mouth 3 (three) times daily. 90 capsule 4  . levothyroxine (SYNTHROID, LEVOTHROID) 25 MCG tablet Take 25 mcg by mouth See admin instructions. 25 mcg  on Monday, Wednesday, Friday and Sunday.    . levothyroxine (SYNTHROID, LEVOTHROID) 75 MCG tablet Take 1 tablet by mouth See admin instructions. 6mcg on Tuesday, Thursday, Saturday.    . magnesium chloride (SLOW-MAG) 64 MG TBEC SR tablet Take 1 tablet (64 mg total) by mouth daily. 30 tablet 2  . polyethylene glycol (MIRALAX / GLYCOLAX) packet Take 17 g by mouth every 12 (twelve) hours as needed for mild constipation, moderate constipation or severe constipation. 14 each 0  . Probiotic Product (RISA-BID PROBIOTIC PO) Take  1 tablet by mouth 2 (two) times daily.    . rivaroxaban (XARELTO) 20 MG TABS tablet Take 1 tablet (20 mg total) by mouth daily with supper. 30 tablet 0  . sertraline (ZOLOFT) 50 MG tablet Take 50 mg by mouth daily.  1   No current facility-administered medications for this visit.     Allergies:  Allergies  Allergen Reactions  . Cataflam [Diclofenac] Other (See Comments)    Unknown;  patient is unaware of allergy.  . Cephalosporins Shortness Of Breath  . Erythromycin Shortness Of Breath  . Keflex [Cephalexin] Shortness Of Breath  . Propoxyphene     Unknown rxn  . Codeine Nausea And Vomiting  . Naproxen Other (See Comments)    Unknown; patient is unaware of allergy  . Penicillins Other (See Comments)    Patient states that she CAN take this; unaware of allergy  . Synalgos-Dc [Aspirin-Caff-Dihydrocodeine] Other (See Comments)    Unknown; patient is unaware of allergy  . Ultram [Tramadol] Other (See Comments)    Unknown; patient is unaware of allergy    Past Medical History, Surgical history, Social history, and Family History were reviewed and updated.   Physical Exam: Blood pressure 161/53, pulse 68, temperature 98.1 F (36.7 C), temperature source Oral, resp. rate 18, height 5\' 6"  (1.676 m), weight 128 lb 8 oz (58.287 kg), SpO2 99 %. ECOG: 2 General appearance: Frail, elderly woman without distress. Head: Normocephalic, without obvious abnormality  No oral masses or lesions. Neck: no adenopathy or thyroid masses. Lymph nodes: Cervical, supraclavicular, and axillary nodes normal. Heart: Irregular without murmurs, rubs or gallops. Lung:chest clear, no wheezing, rales, normal symmetric air entry.  Abdomen: soft, non-tender, without masses or organomegaly. No rebound or guarding. EXT: Slight erythema noted on her palms. Dry skin also noted. Neurological: No deficits. She is able to ambulate without any difficulties.   Lab Results: Lab Results  Component Value Date   WBC 4.1 04/02/2015   HGB 13.4 04/02/2015   HCT 39.7 04/02/2015   MCV 103.9* 04/02/2015   PLT 137* 04/02/2015     Chemistry      Component Value Date/Time   NA 141 02/26/2015 0936   NA 141 05/18/2014 0340   K 3.9 02/26/2015 0936   K 4.1 05/23/2014 0430   CL 102 05/18/2014 0340   CO2 31* 02/26/2015 0936   CO2 33* 05/18/2014 0340   BUN 17.4 02/26/2015 0936   BUN 14 05/18/2014 0340    CREATININE 0.7 02/26/2015 0936   CREATININE 0.52 05/22/2014 0558      Component Value Date/Time   CALCIUM 9.5 02/26/2015 0936   CALCIUM 8.4 05/18/2014 0340   ALKPHOS 113 02/26/2015 0936   ALKPHOS 62 01/23/2014 0511   AST 22 02/26/2015 0936   AST 15 01/23/2014 0511   ALT 17 02/26/2015 0936   ALT 10 01/23/2014 0511   BILITOT 1.08 02/26/2015 0936   BILITOT 0.7 01/23/2014 0511      Results for Bethany [name], Bethany [name] (MRN [id number]) as of 02/26/2015 09:58  Ref. Range 11/22/2014 12:42 01/29/2015 12:01  CEA Latest Ref Range: 0.0-5.0 ng/mL 11.8 (H) 7.0 (H)     Impression and Plan:  82 year old woman with the following issues:   1. Rectal cancer presented with hematochezia and status post colonoscopy and EUS. The care of by EUS criteria was 4 cm in length around 4-5 cm from the anal verge. The staging was T3 N0 M1 with possible liver metastasis. She is status post radiation therapy with oral  Xeloda with still residual tumor as evident by the PET scan results.   She is status post surgical resection with a pathology revealed invasive adenocarcinoma through the muscularis propria. There are 2 extramural satellite tumor nodules. 12 lymph nodes were negative for metastatic cancer.  PET CT scan on 09/05/2014 showed progression of disease with disease in the liver and lung nodules.   She is currently on Xeloda. She has tolerated the first 3 cycles at 1000 mg without any complications. PET CT scan obtained on 01/02/2015 showed mild progression of disease but she continues to be asymptomatic and her quality of life remains reasonable.  She is currently on 1500 mg total since November 2016 and have tolerated it with some mild complaints. The plan is to continue on the same dose and schedule and repeat imaging studies in March 2017.   2. Hepatic masses. This could represent metastatic disease from her rectal cancer. Liver directed therapy can be an option if systemic therapy is not successful.  3.  History of pulmonary embolism: She is on full dose anticoagulation with Xarelto. No bleeding noted or interaction with Xeloda.  4. Erythema of the palms and soles: Related to her to Xeloda. I will prescribe her a steroid cream to use as needed to relieve some of the symptoms. No dose reduction or delay is needed at this time. I will assess her tumor to make sure to keep her skin moist utilizing moisturizes.  5. Follow-up: Will be to 4 weeks to assess complications of her next cycle of chemotherapy.  [facility], MD 2/21/201710:01 AM

## 2015-04-03 LAB — CEA: CEA: 13.3 ng/mL — ABNORMAL HIGH (ref 0.0–4.7)

## 2015-04-03 LAB — CEA (PARALLEL TESTING): CEA: 7 ng/mL — ABNORMAL HIGH

## 2015-04-18 ENCOUNTER — Other Ambulatory Visit: Payer: Self-pay | Admitting: *Deleted

## 2015-04-18 DIAGNOSIS — C20 Malignant neoplasm of rectum: Secondary | ICD-10-CM

## 2015-04-18 DIAGNOSIS — C787 Secondary malignant neoplasm of liver and intrahepatic bile duct: Secondary | ICD-10-CM

## 2015-04-18 MED ORDER — CAPECITABINE 500 MG PO TABS: ORAL_TABLET | ORAL | Status: DC

## 2015-05-01 ENCOUNTER — Other Ambulatory Visit: Payer: Self-pay | Admitting: Oncology

## 2015-05-01 ENCOUNTER — Ambulatory Visit (HOSPITAL_COMMUNITY)
Admission: RE | Admit: 2015-05-01 | Discharge: 2015-05-01 | Disposition: A | Discharge status: Home / Self Care | Payer: Medicare Other | Source: Ambulatory Visit | Attending: Oncology | Admitting: Oncology

## 2015-05-01 ENCOUNTER — Other Ambulatory Visit (HOSPITAL_BASED_OUTPATIENT_CLINIC_OR_DEPARTMENT_OTHER): Payer: Medicare Other

## 2015-05-01 DIAGNOSIS — I517 Cardiomegaly: Secondary | ICD-10-CM | POA: Insufficient documentation

## 2015-05-01 DIAGNOSIS — C7800 Secondary malignant neoplasm of unspecified lung: Secondary | ICD-10-CM | POA: Insufficient documentation

## 2015-05-01 DIAGNOSIS — Z90710 Acquired absence of both cervix and uterus: Secondary | ICD-10-CM | POA: Insufficient documentation

## 2015-05-01 DIAGNOSIS — Z9889 Other specified postprocedural states: Secondary | ICD-10-CM | POA: Insufficient documentation

## 2015-05-01 DIAGNOSIS — N281 Cyst of kidney, acquired: Secondary | ICD-10-CM | POA: Diagnosis not present

## 2015-05-01 DIAGNOSIS — C787 Secondary malignant neoplasm of liver and intrahepatic bile duct: Secondary | ICD-10-CM | POA: Diagnosis not present

## 2015-05-01 DIAGNOSIS — R188 Other ascites: Secondary | ICD-10-CM | POA: Insufficient documentation

## 2015-05-01 DIAGNOSIS — I2510 Atherosclerotic heart disease of native coronary artery without angina pectoris: Secondary | ICD-10-CM | POA: Diagnosis not present

## 2015-05-01 DIAGNOSIS — C20 Malignant neoplasm of rectum: Secondary | ICD-10-CM | POA: Insufficient documentation

## 2015-05-01 LAB — COMPREHENSIVE METABOLIC PANEL
ALT: 16 U/L (ref 0–55)
ANION GAP: 8 meq/L (ref 3–11)
AST: 20 U/L (ref 5–34)
Albumin: 4 g/dL (ref 3.5–5.0)
Alkaline Phosphatase: 115 U/L (ref 40–150)
BILIRUBIN TOTAL: 1.22 mg/dL — AB (ref 0.20–1.20)
BUN: 21.1 mg/dL (ref 7.0–26.0)
CALCIUM: 9.7 mg/dL (ref 8.4–10.4)
CHLORIDE: 103 meq/L (ref 98–109)
CO2: 32 meq/L — AB (ref 22–29)
CREATININE: 0.7 mg/dL (ref 0.6–1.1)
EGFR: 80 mL/min/{1.73_m2} — ABNORMAL LOW (ref >90)
Glucose: 95 mg/dl (ref 70–140)
Potassium: 3.7 mEq/L (ref 3.5–5.1)
Sodium: 143 mEq/L (ref 136–145)
TOTAL PROTEIN: 6.6 g/dL (ref 6.4–8.3)

## 2015-05-01 LAB — CBC WITH DIFFERENTIAL/PLATELET
BASO%: 0.9 % (ref 0.0–2.0)
Basophils Absolute: 0 10*3/uL (ref 0.0–0.1)
EOS ABS: 0.1 10*3/uL (ref 0.0–0.5)
EOS%: 1.9 % (ref 0.0–7.0)
HEMATOCRIT: 38.1 % (ref 34.8–46.6)
HGB: 13 g/dL (ref 11.6–15.9)
LYMPH%: 9.2 % — AB (ref 14.0–49.7)
MCH: 35.3 pg — ABNORMAL HIGH (ref 25.1–34.0)
MCHC: 34 g/dL (ref 31.5–36.0)
MCV: 103.8 fL — ABNORMAL HIGH (ref 79.5–101.0)
MONO#: 0.3 10*3/uL (ref 0.1–0.9)
MONO%: 6.1 % (ref 0.0–14.0)
NEUT%: 81.9 % — AB (ref 38.4–76.8)
NEUTROS ABS: 3.4 10*3/uL (ref 1.5–6.5)
PLATELETS: 130 10*3/uL — AB (ref 145–400)
RBC: 3.67 10*6/uL — ABNORMAL LOW (ref 3.70–5.45)
RDW: 15.1 % — ABNORMAL HIGH (ref 11.2–14.5)
WBC: 4.2 10*3/uL (ref 3.9–10.3)
lymph#: 0.4 10*3/uL — ABNORMAL LOW (ref 0.9–3.3)

## 2015-05-01 LAB — GLUCOSE, CAPILLARY: Glucose-Capillary: 91 mg/dL (ref 65–99)

## 2015-05-01 MED ORDER — FLUDEOXYGLUCOSE F - 18 (FDG) INJECTION
6.4200 | Freq: Once | INTRAVENOUS | Status: AC | PRN
  Administered 2015-05-01: 6.42 via INTRAVENOUS

## 2015-05-03 ENCOUNTER — Other Ambulatory Visit: Payer: Self-pay | Admitting: *Deleted

## 2015-05-03 ENCOUNTER — Ambulatory Visit (HOSPITAL_BASED_OUTPATIENT_CLINIC_OR_DEPARTMENT_OTHER): Payer: Medicare Other | Admitting: Oncology

## 2015-05-03 ENCOUNTER — Telehealth: Payer: Self-pay | Admitting: Oncology

## 2015-05-03 VITALS — BP 163/53 | HR 59 | Temp 97.5°F | Resp 17 | Ht 66.0 in | Wt 128.7 lb

## 2015-05-03 DIAGNOSIS — R911 Solitary pulmonary nodule: Secondary | ICD-10-CM | POA: Diagnosis not present

## 2015-05-03 DIAGNOSIS — L539 Erythematous condition, unspecified: Secondary | ICD-10-CM

## 2015-05-03 DIAGNOSIS — C20 Malignant neoplasm of rectum: Secondary | ICD-10-CM

## 2015-05-03 DIAGNOSIS — C787 Secondary malignant neoplasm of liver and intrahepatic bile duct: Secondary | ICD-10-CM | POA: Diagnosis not present

## 2015-05-03 DIAGNOSIS — Z86711 Personal history of pulmonary embolism: Secondary | ICD-10-CM

## 2015-05-03 MED ORDER — CAPECITABINE 500 MG PO TABS: ORAL_TABLET | ORAL | Status: DC

## 2015-05-03 NOTE — Progress Notes (Signed)
Hematology and Oncology Follow Up Visit  Bethany [name] [id number] Oct 23, 1928 82 y.o. 05/03/2015 9:57 AM Bethany [name], MDKim, Bethany Rinks, MD   Principle Diagnosis: 82 year old woman with rectal adenocarcinoma diagnosed in August of 2015. Her staging by EUS his T3 N0. MRI of the liver showed potential hepatic metastasis.  Past therapy:  Radiation therapy concomitantly with oral Xeloda at 500 mg twice a day started on 10/25/2013. Therapy concluded in October 2015. She received total dose to 4320 cGy. Therapy interrupted due to recurrent hospitalizations. She is status post robotic-assisted rectosigmoid low anterior resection done on 05/17/2014. The pathology revealed a T3 N1 disease. PET/CT scan in July 2016 showed progresses metastatic disease in the liver and pulmonary nodules.   Current therapy:  Xeloda 1000 mg daily started in September 2016. She takes it 2 weeks on 1 week off per cycle. She is status post 3 cycles of therapy related to proceed with a third one. The dose was increased to 1500 mg daily starting on 01/04/2015.  Interim History:  Bethany [name] presents today for a followup visit with her son. Since the last visit, she reports no major complaints. She reports reasonable quality of life without any decline. She has maintained her weight and performance status. She is using a walker for ambulation and able to perform activities of daily living and has not reported any falls or syncope. Her appetite remained stable and have not lost any weight. She denied any constipation or diarrhea except for one episode after her PET scan. That episode lasted 1 day and currently have resolved.  She continues to tolerate Xeloda without complications. She have done well with the increased dose up to 1500 mg. she has reported some increased erythema and pain in her hand and foot. She does not report any desquamation or neurological deficits. She continues to report trace edema in her right lower extremity compared  to the left. This have not changed in few months.  She has not had any headaches or blurry vision. She does not report any syncope or seizures. She does not report any chest pain, shortness of breath, cough or hemoptysis. She does not report any palpitation, orthopnea or PND. She does not report any leg edema. She denies abdominal pain, constipation or obstruction. She does not report any frequency urgency or hesitancy. She is not abort any skeletal complaints. Rest of the review of systems unremarkable.     Medications: I have reviewed the patient's current medications.  Current Outpatient Prescriptions  Medication Sig Dispense Refill  . acetaminophen (TYLENOL) 325 MG tablet Take 2 tablets (650 mg total) by mouth every 6 (six) hours as needed for mild pain (or Fever >/= 101).    . capecitabine (XELODA) 500 MG tablet Take 1 tablet in the morning and 2 tablets in the evening after meals for 14 days then 7 days off. 42 tablet 0  . cholecalciferol (VITAMIN D) 1000 UNITS tablet Take 2,000 Units by mouth daily.    . clobetasol cream (TEMOVATE) [id number] % Apply 1 application topically 2 (two) times daily. 30 g 0  . feeding supplement, RESOURCE BREEZE, (RESOURCE BREEZE) LIQD Take 1 Container by mouth 2 (two) times daily between meals.  0  . gabapentin (NEURONTIN) 100 MG capsule Take 1 capsule (100 mg total) by mouth 3 (three) times daily. 90 capsule 4  . levothyroxine (SYNTHROID, LEVOTHROID) 25 MCG tablet Take 25 mcg by mouth See admin instructions. 25 mcg  on Monday, Wednesday, Friday and Sunday.    Marland Kitchen  levothyroxine (SYNTHROID, LEVOTHROID) 75 MCG tablet Take 1 tablet by mouth See admin instructions. 66mcg on Tuesday, Thursday, Saturday.    . magnesium chloride (SLOW-MAG) 64 MG TBEC SR tablet Take 1 tablet (64 mg total) by mouth daily. 30 tablet 2  . polyethylene glycol (MIRALAX / GLYCOLAX) packet Take 17 g by mouth every 12 (twelve) hours as needed for mild constipation, moderate constipation or severe  constipation. 14 each 0  . Probiotic Product (RISA-BID PROBIOTIC PO) Take 1 tablet by mouth 2 (two) times daily.    . rivaroxaban (XARELTO) 20 MG TABS tablet Take 1 tablet (20 mg total) by mouth daily with supper. 30 tablet 0   No current facility-administered medications for this visit.     Allergies:  Allergies  Allergen Reactions  . Cataflam [Diclofenac] Other (See Comments)    Unknown; patient is unaware of allergy.  . Cephalosporins Shortness Of Breath  . Erythromycin Shortness Of Breath  . Keflex [Cephalexin] Shortness Of Breath  . Propoxyphene     Unknown rxn  . Codeine Nausea And Vomiting  . Naproxen Other (See Comments)    Unknown; patient is unaware of allergy  . Penicillins Other (See Comments)    Patient states that she CAN take this; unaware of allergy  . Synalgos-Dc [Aspirin-Caff-Dihydrocodeine] Other (See Comments)    Unknown; patient is unaware of allergy  . Ultram [Tramadol] Other (See Comments)    Unknown; patient is unaware of allergy    Past Medical History, Surgical history, Social history, and Family History were reviewed and updated.   Physical Exam: Blood pressure 163/53, pulse 59, temperature 97.5 F (36.4 C), temperature source Oral, resp. rate 17, height 5\' 6"  (1.676 m), weight 128 lb 11.2 oz (58.378 kg), SpO2 100 %. ECOG: 2 General appearance: Alert, awake woman without distress. Head: Normocephalic, without obvious abnormality  No oral thrush noted. Neck: no adenopathy or thyroid masses. Lymph nodes: Cervical, supraclavicular, and axillary nodes normal. Heart: Irregular without murmurs, rubs or gallops. Lung:chest clear, no wheezing, rales, normal symmetric air entry.  Abdomen: soft, non-tender, without masses or organomegaly. No shifting dullness or ascites. EXT: Slight erythema noted on her palms. Dry skin also noted. Neurological: No ambulation difficulties. No deficits noted on exam.   Lab Results: Lab Results  Component Value Date    WBC 4.2 05/01/2015   HGB 13.0 05/01/2015   HCT 38.1 05/01/2015   MCV 103.8* 05/01/2015   PLT 130* 05/01/2015     Chemistry      Component Value Date/Time   NA 143 05/01/2015 0922   NA 141 05/18/2014 0340   K 3.7 05/01/2015 0922   K 4.1 05/23/2014 0430   CL 102 05/18/2014 0340   CO2 32* 05/01/2015 0922   CO2 33* 05/18/2014 0340   BUN 21.1 05/01/2015 0922   BUN 14 05/18/2014 0340   CREATININE 0.7 05/01/2015 0922   CREATININE 0.52 05/22/2014 0558      Component Value Date/Time   CALCIUM 9.7 05/01/2015 0922   CALCIUM 8.4 05/18/2014 0340   ALKPHOS 115 05/01/2015 0922   ALKPHOS 62 01/23/2014 0511   AST 20 05/01/2015 0922   AST 15 01/23/2014 0511   ALT 16 05/01/2015 0922   ALT 10 01/23/2014 0511   BILITOT 1.22* 05/01/2015 0922   BILITOT 0.7 01/23/2014 0511     Results for Bethany [name], Bethany [name] (MRN [id number]) as of 05/03/2015 09:47  Ref. Range 11/22/2014 12:42 01/29/2015 12:01 04/02/2015 09:29  CEA Latest Units: ng/mL 11.8 (H) 7.0 (H) 7.0 (  H)    EXAM: NUCLEAR MEDICINE PET SKULL BASE TO THIGH  TECHNIQUE: 6.42 mCi F-18 FDG was injected intravenously. Full-ring PET imaging was performed from the skull base to thigh after the radiotracer. CT data was obtained and used for attenuation correction and anatomic localization.  FASTING BLOOD GLUCOSE: Value: 91 mg/dl  COMPARISON: PET-CT dated 01/02/2015  FINDINGS: NECK  No hypermetabolic lymph nodes in the neck.  CHEST  Progression of bilateral pulmonary metastases. Index lesions include:  --6 mm left upper lobe nodule with max SUV 2.3 (series 7/ image 28), previously 7 mm with max SUV 1.5  --12 mm medial right lower lobe nodule with max SUV 5.6 (series 7/ image 42), previously 10 mm with max SUV 3.1  Mild cardiomegaly. No pericardial effusion. Mild atherosclerotic calcifications of the aortic arch. No hypermetabolic thoracic lymphadenopathy.  ABDOMEN/PELVIS  Progression of hepatic metastases in both  lobes. Index lesions include:  --5.6 x 4.6 cm lesion in the central right hepatic lobe with max SUV 13.6 (series 4/image 94), previously 5.2 x 4.0 cm with max SUV 8.4  --3.8 x 3.4 cm lesion in the lateral segment left hepatic lobe with max SUV 17.4 (series 4/image 98), previously 1.9 x 2.0 cm with max SUV 8.7  Additional hepatic metastases are significantly progressed.  No abnormal hypermetabolic activity within the pancreas, adrenal glands, or spleen.  Status post abdominoperineal resection with left lower quadrant colostomy. Small volume abdominopelvic ascites. Atherosclerotic calcifications the abdominal aorta and branch vessels. Bilateral renal sinus cysts. Status post hysterectomy.  No hypermetabolic lymph nodes in the abdomen or pelvis.  SKELETON  No focal hypermetabolic activity to suggest skeletal metastasis.  IMPRESSION: Progression of hepatic and pulmonary metastases, as above.    Impression and Plan:  82 year old woman with the following issues:   1. Rectal cancer presented with hematochezia and status post colonoscopy and EUS. The care of by EUS criteria was 4 cm in length around 4-5 cm from the anal verge. The staging was T3 N0 M1 with possible liver metastasis. She is status post radiation therapy with oral Xeloda with still residual tumor as evident by the PET scan results.   She is status post surgical resection with a pathology revealed invasive adenocarcinoma through the muscularis propria. There are 2 extramural satellite tumor nodules. 12 lymph nodes were negative for metastatic cancer.  PET CT scan on 09/05/2014 showed progression of disease with disease in the liver and lung nodules.   She is currently on Xeloda and have tolerated it well so far. PET CT scan on 05/01/2015 was reviewed today and showed for the most part stable disease. There are no new lung or liver masses noted. The current masses have increased slightly.  Risks and benefits  of continuing Xeloda at 1500 mg 2 weeks on and one-week off was discussed today. Given the excellent quality of life she is enjoying and relatively disease control, we have decided to continue on the same dose and schedule. Plan to repeat imaging studies in 6 months or sooner if she develops any symptoms.   2. Hepatic masses. This could represent metastatic disease from her rectal cancer. Liver directed therapy can be an option if systemic therapy is not successful or she becomes symptomatic.  3. History of pulmonary embolism: She is on full dose anticoagulation with Xarelto. No bleeding noted or interaction with Xeloda.  4. Erythema of the palms and soles: Related to her to Xeloda. This has improved with steroid cream.  5. Follow-up: Will be to  4 weeks to assess complications of her next cycle of chemotherapy.  [facility], MD 3/24/20179:57 AM

## 2015-05-03 NOTE — Telephone Encounter (Signed)
per pof to sch pt appt-gave pt copy of avs °

## 2015-05-20 IMAGING — CR DG FEMUR 2V*L*
2 series · 2 of 2 positions shown · non-contrast
Comparison: Left hip series of today's date.

CLINICAL DATA: Status post fall in bathroom following losing her
balance now the patient complains of left hip pain radiating into
the knee

EXAM:
LEFT FEMUR - 2 VIEW

[t femur with knee ap left]
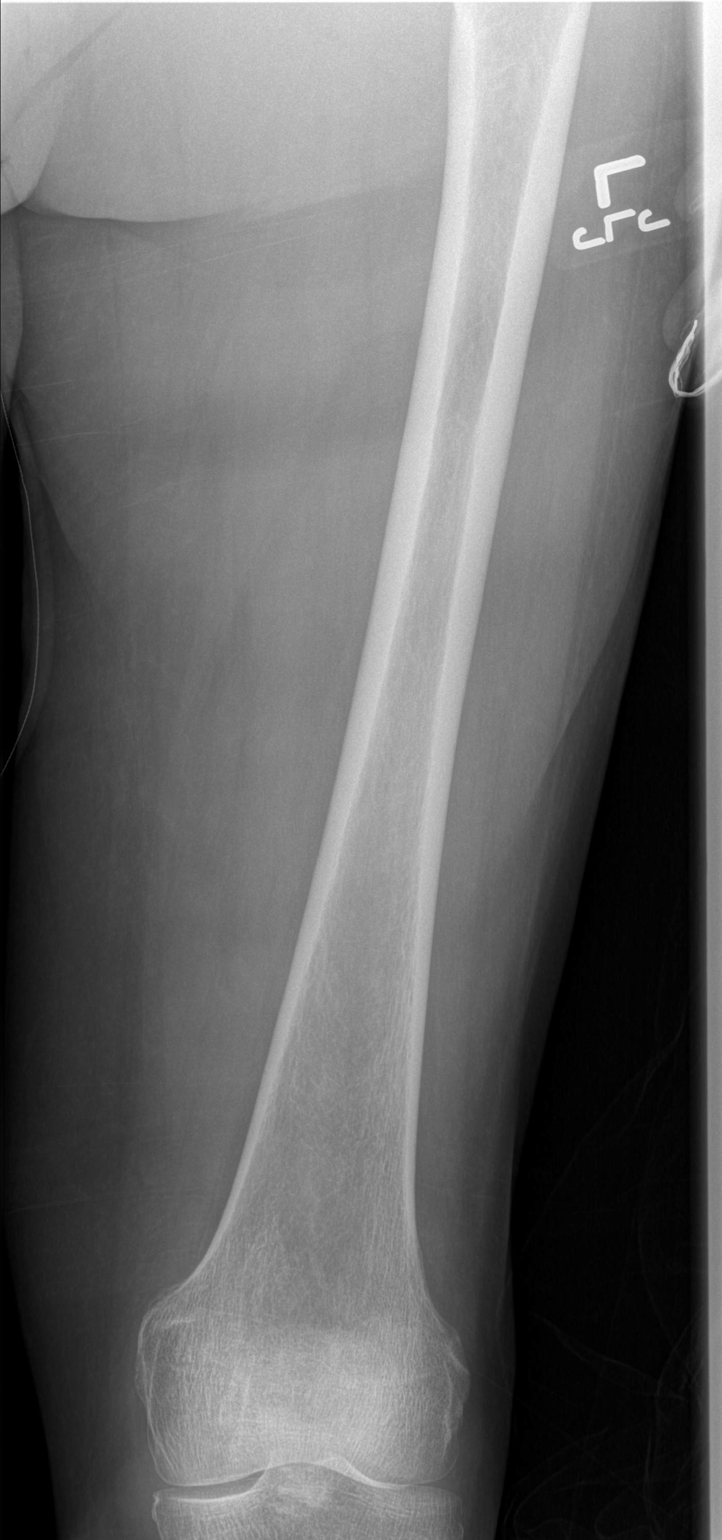

[view not recorded]
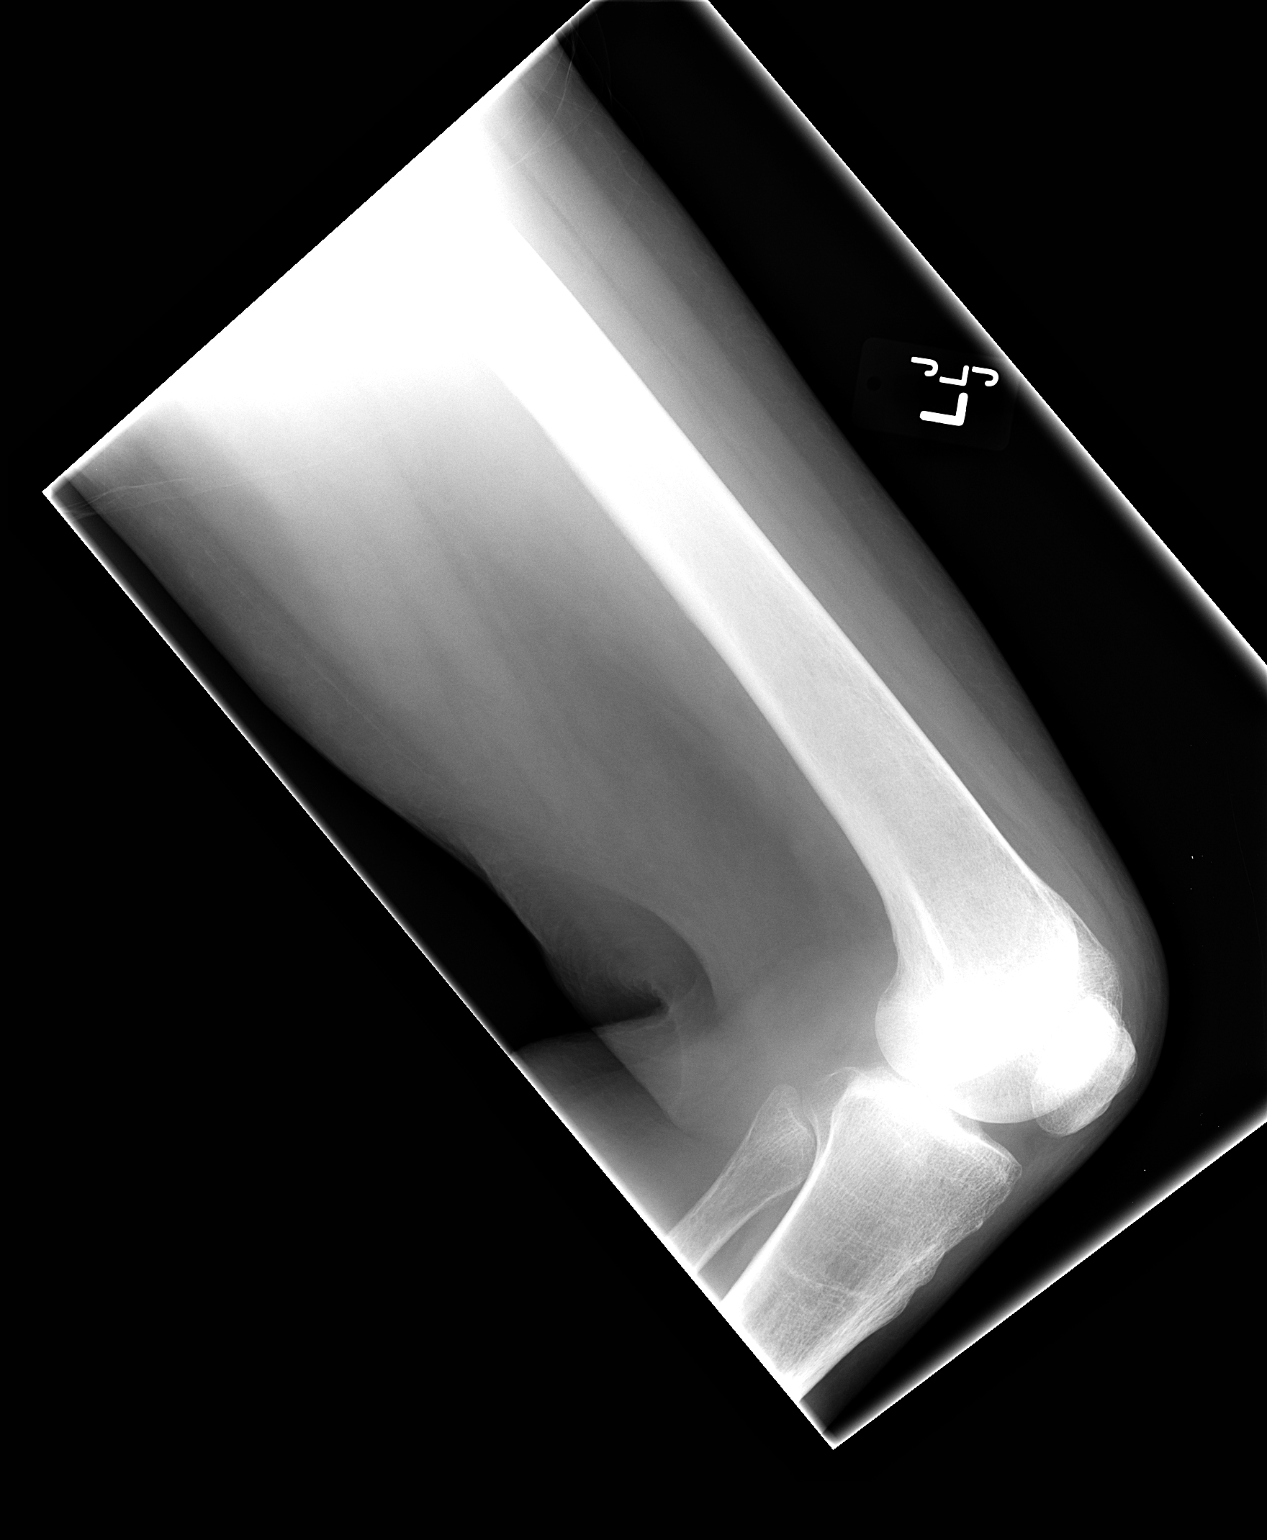

[2 of 2 positions shown; findings below may reference images not displayed]

FINDINGS: The femur is adequately mineralized for age. There is no acute
fracture nor dislocation. The observed portions of the left knee are
unremarkable. The femoral head and neck and intertrochanteric region
are evaluated on the accompanying left hip series.
IMPRESSION: There is no acute bony abnormality of the visualized portions of the
left femur.

## 2015-05-27 ENCOUNTER — Telehealth: Payer: Self-pay | Admitting: Oncology

## 2015-05-27 NOTE — Telephone Encounter (Signed)
Call day 4/21 - moved lab/fu to same day at 12 pm. Spoke with patient she is aware.

## 2015-05-29 ENCOUNTER — Other Ambulatory Visit: Payer: Self-pay | Admitting: *Deleted

## 2015-05-29 DIAGNOSIS — C20 Malignant neoplasm of rectum: Secondary | ICD-10-CM

## 2015-05-29 DIAGNOSIS — C787 Secondary malignant neoplasm of liver and intrahepatic bile duct: Secondary | ICD-10-CM

## 2015-05-29 MED ORDER — CAPECITABINE 500 MG PO TABS: ORAL_TABLET | ORAL | Status: DC

## 2015-06-03 ENCOUNTER — Encounter: Payer: Self-pay | Admitting: *Deleted

## 2015-06-03 ENCOUNTER — Other Ambulatory Visit: Payer: Self-pay | Admitting: *Deleted

## 2015-06-03 ENCOUNTER — Telehealth: Payer: Self-pay | Admitting: *Deleted

## 2015-06-03 MED ORDER — PROCHLORPERAZINE MALEATE 10 MG PO TABS: 10.0000 mg | ORAL_TABLET | Freq: Four times a day (QID) | ORAL | Status: DC | PRN

## 2015-06-03 MED ORDER — ONDANSETRON HCL 8 MG PO TABS: 8.0000 mg | ORAL_TABLET | Freq: Three times a day (TID) | ORAL | Status: DC | PRN

## 2015-06-03 NOTE — Progress Notes (Signed)
Spoke with nephew wallace, per kristin curcio NP compazine and zofran e-scribed to patient's pharmacy. With directions on how to take and when. Bethany [name] verbalized understanding.

## 2015-06-03 NOTE — Telephone Encounter (Signed)
Nephew wallace shepard calling to request something for nausea for patient.

## 2015-06-05 ENCOUNTER — Other Ambulatory Visit (HOSPITAL_BASED_OUTPATIENT_CLINIC_OR_DEPARTMENT_OTHER): Payer: Medicare Other

## 2015-06-05 ENCOUNTER — Ambulatory Visit (HOSPITAL_BASED_OUTPATIENT_CLINIC_OR_DEPARTMENT_OTHER): Payer: Medicare Other | Admitting: Oncology

## 2015-06-05 ENCOUNTER — Telehealth: Payer: Self-pay | Admitting: Oncology

## 2015-06-05 VITALS — BP 151/52 | HR 44 | Temp 97.5°F | Resp 16 | Ht 66.0 in | Wt 127.2 lb

## 2015-06-05 DIAGNOSIS — C787 Secondary malignant neoplasm of liver and intrahepatic bile duct: Secondary | ICD-10-CM

## 2015-06-05 DIAGNOSIS — R911 Solitary pulmonary nodule: Secondary | ICD-10-CM

## 2015-06-05 DIAGNOSIS — Z86711 Personal history of pulmonary embolism: Secondary | ICD-10-CM | POA: Diagnosis not present

## 2015-06-05 DIAGNOSIS — C20 Malignant neoplasm of rectum: Secondary | ICD-10-CM | POA: Diagnosis not present

## 2015-06-05 DIAGNOSIS — Z7901 Long term (current) use of anticoagulants: Secondary | ICD-10-CM

## 2015-06-05 LAB — COMPREHENSIVE METABOLIC PANEL
ALT: 19 U/L (ref 0–55)
ANION GAP: 6 meq/L (ref 3–11)
AST: 24 U/L (ref 5–34)
Albumin: 3.7 g/dL (ref 3.5–5.0)
Alkaline Phosphatase: 117 U/L (ref 40–150)
BILIRUBIN TOTAL: 1.03 mg/dL (ref 0.20–1.20)
BUN: 18.9 mg/dL (ref 7.0–26.0)
CALCIUM: 9.6 mg/dL (ref 8.4–10.4)
CHLORIDE: 103 meq/L (ref 98–109)
CO2: 31 mEq/L — ABNORMAL HIGH (ref 22–29)
CREATININE: 0.7 mg/dL (ref 0.6–1.1)
EGFR: 80 mL/min/{1.73_m2} — ABNORMAL LOW (ref >90)
Glucose: 91 mg/dl (ref 70–140)
Potassium: 3.8 mEq/L (ref 3.5–5.1)
Sodium: 141 mEq/L (ref 136–145)
TOTAL PROTEIN: 6.2 g/dL — AB (ref 6.4–8.3)

## 2015-06-05 LAB — CBC WITH DIFFERENTIAL/PLATELET
BASO%: 0.3 % (ref 0.0–2.0)
Basophils Absolute: 0 10*3/uL (ref 0.0–0.1)
EOS%: 2.3 % (ref 0.0–7.0)
Eosinophils Absolute: 0.1 10*3/uL (ref 0.0–0.5)
HEMATOCRIT: 34.9 % (ref 34.8–46.6)
HEMOGLOBIN: 11.9 g/dL (ref 11.6–15.9)
LYMPH#: 0.4 10*3/uL — AB (ref 0.9–3.3)
LYMPH%: 10.5 % — ABNORMAL LOW (ref 14.0–49.7)
MCH: 35.2 pg — ABNORMAL HIGH (ref 25.1–34.0)
MCHC: 34.1 g/dL (ref 31.5–36.0)
MCV: 103.3 fL — ABNORMAL HIGH (ref 79.5–101.0)
MONO#: 0.2 10*3/uL (ref 0.1–0.9)
MONO%: 6 % (ref 0.0–14.0)
NEUT%: 80.9 % — ABNORMAL HIGH (ref 38.4–76.8)
NEUTROS ABS: 2.8 10*3/uL (ref 1.5–6.5)
PLATELETS: 130 10*3/uL — AB (ref 145–400)
RBC: 3.38 10*6/uL — ABNORMAL LOW (ref 3.70–5.45)
RDW: 13.6 % (ref 11.2–14.5)
WBC: 3.5 10*3/uL — AB (ref 3.9–10.3)

## 2015-06-05 NOTE — Telephone Encounter (Signed)
Gave and printed appt sched adn avs fo rpt for May  °

## 2015-06-05 NOTE — Progress Notes (Signed)
Hematology and Oncology Follow Up Visit  [name] [id number] 19-Oct-1928 82 y.o. 06/05/2015 11:43 AM Bethany [name], MDKim, [name], MD   Principle Diagnosis: 82 year old woman with rectal adenocarcinoma diagnosed in August of 2015. Her staging by EUS his T3 N0. MRI of the liver showed potential hepatic metastasis.  Past therapy:  Radiation therapy concomitantly with oral Xeloda at 500 mg twice a day started on 10/25/2013. Therapy concluded in October 2015. She received total dose to 4320 cGy. Therapy interrupted due to recurrent hospitalizations. She is status post robotic-assisted rectosigmoid low anterior resection done on 05/17/2014. The pathology revealed a T3 N1 disease. PET/CT scan in July 2016 showed progresses metastatic disease in the liver and pulmonary nodules.   Current therapy:  Xeloda 1000 mg daily started in September 2016. She takes it 2 weeks on 1 week off per cycle. She is status post 3 cycles of therapy related to proceed with a third one. The dose was increased to 1500 mg daily starting on 01/04/2015.  Interim History:  Ms. Bethany [name] presents today for a followup visit with her son. Since the last visit, she reports an acute gastrointestinal illness started a week ago. She reports she woke up with abdominal pain, diarrhea and vomited on one occasion. She has improved slightly in the last few days and she is no longer reporting any vomiting. She did have some loose bowel habits which have resolved now. She is able to eat certain times certain foods and despite her illness she is able to maintain her weight. She is feeling slightly better in the last 24 hours. Despite her illness, she continues to ambulate without any falls or syncope.  She continues to tolerate Xeloda without complications. She have done well with the increased dose up to 1500 mg. she denied any exacerbation of diarrhea associated with Xeloda. Despite her recent illness, she maintained her schedule of Xeloda.  She  has not had any headaches or blurry vision. She does not report any syncope or seizures. She does not report any chest pain, shortness of breath, cough or hemoptysis. She does not report any palpitation, orthopnea or PND. She does not report any leg edema. She denies abdominal pain, constipation or obstruction. She does not report any frequency urgency or hesitancy. She is not abort any skeletal complaints. Rest of the review of systems unremarkable.     Medications: I have reviewed the patient's current medications.  Current Outpatient Prescriptions  Medication Sig Dispense Refill  . acetaminophen (TYLENOL) 325 MG tablet Take 2 tablets (650 mg total) by mouth every 6 (six) hours as needed for mild pain (or Fever >/= 101).    . capecitabine (XELODA) 500 MG tablet Take 1 tablet in the morning and 2 tablets in the evening after meals for 14 days then 7 days off. 42 tablet 0  . cholecalciferol (VITAMIN D) 1000 UNITS tablet Take 2,000 Units by mouth daily.    . clobetasol cream (TEMOVATE) AB-123456789 % Apply 1 application topically 2 (two) times daily. 30 g 0  . feeding supplement, RESOURCE BREEZE, (RESOURCE BREEZE) LIQD Take 1 Container by mouth 2 (two) times daily between meals.  0  . gabapentin (NEURONTIN) 100 MG capsule Take 1 capsule (100 mg total) by mouth 3 (three) times daily. 90 capsule 4  . levothyroxine (SYNTHROID, LEVOTHROID) 25 MCG tablet Take 25 mcg by mouth See admin instructions. 25 mcg  on Monday, Wednesday, Friday and Sunday.    . levothyroxine (SYNTHROID, LEVOTHROID) 75 MCG tablet Take 1 tablet  by mouth See admin instructions. 70mcg on Tuesday, Thursday, Saturday.    . magnesium chloride (SLOW-MAG) 64 MG TBEC SR tablet Take 1 tablet (64 mg total) by mouth daily. 30 tablet 2  . ondansetron (ZOFRAN) 8 MG tablet Take 1 tablet (8 mg total) by mouth every 8 (eight) hours as needed for nausea or vomiting. 20 tablet 0  . polyethylene glycol (MIRALAX / GLYCOLAX) packet Take 17 g by mouth every 12  (twelve) hours as needed for mild constipation, moderate constipation or severe constipation. 14 each 0  . Probiotic Product (RISA-BID PROBIOTIC PO) Take 1 tablet by mouth 2 (two) times daily.    . prochlorperazine (COMPAZINE) 10 MG tablet Take 1 tablet (10 mg total) by mouth every 6 (six) hours as needed for nausea or vomiting. 30 tablet 0  . rivaroxaban (XARELTO) 20 MG TABS tablet Take 1 tablet (20 mg total) by mouth daily with supper. 30 tablet 0   No current facility-administered medications for this visit.     Allergies:  Allergies  Allergen Reactions  . Cataflam [Diclofenac] Other (See Comments)    Unknown; patient is unaware of allergy.  . Cephalosporins Shortness Of Breath  . Erythromycin Shortness Of Breath  . Keflex [Cephalexin] Shortness Of Breath  . Propoxyphene     Unknown rxn  . Codeine Nausea And Vomiting  . Naproxen Other (See Comments)    Unknown; patient is unaware of allergy  . Penicillins Other (See Comments)    Patient states that she CAN take this; unaware of allergy  . Synalgos-Dc [Aspirin-Caff-Dihydrocodeine] Other (See Comments)    Unknown; patient is unaware of allergy  . Ultram [Tramadol] Other (See Comments)    Unknown; patient is unaware of allergy    Past Medical History, Surgical history, Social history, and Family History were reviewed and updated.   Physical Exam: Blood pressure 151/52, pulse 44, temperature 97.5 F (36.4 C), temperature source Oral, resp. rate 16, height 5\' 6"  (1.676 m), weight 127 lb 3.2 oz (57.698 kg), SpO2 100 %. ECOG: 2 General appearance: Chronically ill-appearing woman without distress.  Head: Normocephalic, without obvious abnormality  No oral ulcers or lesions.  Neck: no adenopathy or thyroid masses. Lymph nodes: Cervical, supraclavicular, and axillary nodes normal. Heart: Irregular without murmurs, rubs or gallops. Lung:chest clear, no wheezing, rales, normal symmetric air entry.  Abdomen: soft, non-tender, without  masses or organomegaly. No rebound or guarding.  EXT: Slight erythema noted on her palms. Dry skin also noted. Neurological: No ambulation difficulties. No deficits noted on exam.   Lab Results: Lab Results  Component Value Date   WBC 3.5* 06/05/2015   HGB 11.9 06/05/2015   HCT 34.9 06/05/2015   MCV 103.3* 06/05/2015   PLT 130* 06/05/2015     Chemistry      Component Value Date/Time   NA 143 05/01/2015 0922   NA 141 05/18/2014 0340   K 3.7 05/01/2015 0922   K 4.1 05/23/2014 0430   CL 102 05/18/2014 0340   CO2 32* 05/01/2015 0922   CO2 33* 05/18/2014 0340   BUN 21.1 05/01/2015 0922   BUN 14 05/18/2014 0340   CREATININE 0.7 05/01/2015 0922   CREATININE 0.52 05/22/2014 0558      Component Value Date/Time   CALCIUM 9.7 05/01/2015 0922   CALCIUM 8.4 05/18/2014 0340   ALKPHOS 115 05/01/2015 0922   ALKPHOS 62 01/23/2014 0511   AST 20 05/01/2015 0922   AST 15 01/23/2014 0511   ALT 16 05/01/2015 [id number]  ALT 10 01/23/2014 0511   BILITOT 1.22* 05/01/2015 0922   BILITOT 0.7 01/23/2014 0511            Impression and Plan:  82 year old woman with the following issues:   1. Rectal cancer presented with hematochezia and status post colonoscopy and EUS. The care of by EUS criteria was 4 cm in length around 4-5 cm from the anal verge. The staging was T3 N0 M1 with possible liver metastasis. She is status post radiation therapy with oral Xeloda with still residual tumor as evident by the PET scan results.   She is status post surgical resection with a pathology revealed invasive adenocarcinoma through the muscularis propria. There are 2 extramural satellite tumor nodules. 12 lymph nodes were negative for metastatic cancer.  PET CT scan on 09/05/2014 showed progression of disease with disease in the liver and lung nodules.   She is currently on Xeloda and have tolerated it well so far. PET CT scan on 05/01/2015 showed  some progression of disease but for the most part stable  disease. There are no new lung or liver masses noted. The current masses have increased slightly.  Risks and benefits of continuing Xeloda at 1500 mg 2 weeks on and one-week off was reviewed again. Given the excellent quality of life she is enjoying and relatively disease control, she has opted to continue at this time. We'll continue to monitor periodically to ensure stability of her symptoms. I do not think her GI symptoms are related to cancer progression and likely a gastrointestinal viral infection.   2. Hepatic masses. Liver directed therapy can be an option if systemic therapy is not successful or she becomes symptomatic.  3. History of pulmonary embolism: She is on full dose anticoagulation with Xarelto. No bleeding noted or interaction with Xeloda.  4. Vomiting and diarrhea: Likely related to an acute gastrointestinal viral infection and seems to be resolving. Treatment for this condition would be mainly supportive including increasing hydration and certain foods that does not exacerbate her stomach. These instructions were discussed with the patient and her son extensively today and she is to let me know if her symptoms get worse in the near future.  5. Follow-up: Will be to 4 weeks to assess complications of her next cycle of chemotherapy.  [facility], MD 4/26/201711:43 AM

## 2015-06-06 LAB — CEA (PARALLEL TESTING): CEA: 9.6 ng/mL — ABNORMAL HIGH

## 2015-06-06 LAB — CEA: CEA: 18.4 ng/mL — ABNORMAL HIGH (ref 0.0–4.7)

## 2015-06-26 ENCOUNTER — Other Ambulatory Visit: Payer: Self-pay | Admitting: *Deleted

## 2015-06-26 DIAGNOSIS — C20 Malignant neoplasm of rectum: Secondary | ICD-10-CM

## 2015-06-26 DIAGNOSIS — C787 Secondary malignant neoplasm of liver and intrahepatic bile duct: Secondary | ICD-10-CM

## 2015-06-26 MED ORDER — CAPECITABINE 500 MG PO TABS: ORAL_TABLET | ORAL | Status: DC

## 2015-07-02 ENCOUNTER — Other Ambulatory Visit: Payer: Self-pay | Admitting: *Deleted

## 2015-07-02 DIAGNOSIS — C787 Secondary malignant neoplasm of liver and intrahepatic bile duct: Secondary | ICD-10-CM

## 2015-07-02 DIAGNOSIS — C20 Malignant neoplasm of rectum: Secondary | ICD-10-CM

## 2015-07-02 MED ORDER — CAPECITABINE 500 MG PO TABS: ORAL_TABLET | ORAL | Status: DC

## 2015-07-04 ENCOUNTER — Other Ambulatory Visit (HOSPITAL_BASED_OUTPATIENT_CLINIC_OR_DEPARTMENT_OTHER): Payer: Medicare Other

## 2015-07-04 ENCOUNTER — Ambulatory Visit (HOSPITAL_BASED_OUTPATIENT_CLINIC_OR_DEPARTMENT_OTHER): Payer: Medicare Other | Admitting: Oncology

## 2015-07-04 ENCOUNTER — Telehealth: Payer: Self-pay | Admitting: Oncology

## 2015-07-04 VITALS — BP 161/58 | HR 58 | Temp 98.2°F | Resp 18 | Ht 66.0 in | Wt 123.9 lb

## 2015-07-04 DIAGNOSIS — C20 Malignant neoplasm of rectum: Secondary | ICD-10-CM

## 2015-07-04 DIAGNOSIS — C7800 Secondary malignant neoplasm of unspecified lung: Secondary | ICD-10-CM

## 2015-07-04 DIAGNOSIS — C787 Secondary malignant neoplasm of liver and intrahepatic bile duct: Secondary | ICD-10-CM

## 2015-07-04 DIAGNOSIS — Z86711 Personal history of pulmonary embolism: Secondary | ICD-10-CM | POA: Diagnosis not present

## 2015-07-04 LAB — COMPREHENSIVE METABOLIC PANEL
ALBUMIN: 3.4 g/dL — AB (ref 3.5–5.0)
ALK PHOS: 86 U/L (ref 40–150)
ALT: 21 U/L (ref 0–55)
ANION GAP: 7 meq/L (ref 3–11)
AST: 26 U/L (ref 5–34)
BUN: 12.2 mg/dL (ref 7.0–26.0)
CALCIUM: 9.4 mg/dL (ref 8.4–10.4)
CO2: 32 mEq/L — ABNORMAL HIGH (ref 22–29)
Chloride: 103 mEq/L (ref 98–109)
Creatinine: 0.6 mg/dL (ref 0.6–1.1)
EGFR: 82 mL/min/{1.73_m2} — AB (ref >90)
Glucose: 94 mg/dl (ref 70–140)
POTASSIUM: 3.5 meq/L (ref 3.5–5.1)
Sodium: 143 mEq/L (ref 136–145)
Total Bilirubin: 1.03 mg/dL (ref 0.20–1.20)
Total Protein: 5.7 g/dL — ABNORMAL LOW (ref 6.4–8.3)

## 2015-07-04 LAB — CBC WITH DIFFERENTIAL/PLATELET
BASO%: 0.8 % (ref 0.0–2.0)
BASOS ABS: 0 10*3/uL (ref 0.0–0.1)
EOS ABS: 0.2 10*3/uL (ref 0.0–0.5)
EOS%: 4.6 % (ref 0.0–7.0)
HEMATOCRIT: 34.4 % — AB (ref 34.8–46.6)
HEMOGLOBIN: 11.7 g/dL (ref 11.6–15.9)
LYMPH#: 0.6 10*3/uL — AB (ref 0.9–3.3)
LYMPH%: 15.2 % (ref 14.0–49.7)
MCH: 35.9 pg — AB (ref 25.1–34.0)
MCHC: 34 g/dL (ref 31.5–36.0)
MCV: 105.5 fL — AB (ref 79.5–101.0)
MONO#: 0.3 10*3/uL (ref 0.1–0.9)
MONO%: 6.8 % (ref 0.0–14.0)
NEUT#: 2.7 10*3/uL (ref 1.5–6.5)
NEUT%: 72.6 % (ref 38.4–76.8)
PLATELETS: 166 10*3/uL (ref 145–400)
RBC: 3.26 10*6/uL — ABNORMAL LOW (ref 3.70–5.45)
RDW: 15.6 % — AB (ref 11.2–14.5)
WBC: 3.7 10*3/uL — ABNORMAL LOW (ref 3.9–10.3)

## 2015-07-04 NOTE — Progress Notes (Signed)
Hematology and Oncology Follow Up Visit  Bethany [name] [id number] 1928/02/14 82 y.o. 07/04/2015 3:22 PM Bethany [name], MDKim, [name], MD   Principle Diagnosis: 82 year old woman with rectal adenocarcinoma diagnosed in August of 2015. Her staging by EUS his T3 N0. MRI of the liver showed potential hepatic metastasis.  Past therapy:  Radiation therapy concomitantly with oral Xeloda at 500 mg twice a day started on 10/25/2013. Therapy concluded in October 2015. She received total dose to 4320 cGy. Therapy interrupted due to recurrent hospitalizations. She is status post robotic-assisted rectosigmoid low anterior resection done on 05/17/2014. The pathology revealed a T3 N1 disease. PET/CT scan in July 2016 showed progresses metastatic disease in the liver and pulmonary nodules.   Current therapy:  Xeloda 1000 mg daily started in September 2016. She takes it 2 weeks on 1 week off per cycle. She is status post 3 cycles of therapy related to proceed with a third one. The dose was increased to 1500 mg daily starting on 01/04/2015.  Interim History:  Ms. [name] presents today for a followup visit with her son. Since the last visit, she reports episode of diarrhea that lasted for a few days which caused her to lose close to 7 pounds. She was seen by her primary care physician and was started on the antidiarrheal medication and her symptoms resolved. Her C. difficile test was negative and currently feels relatively fair. After period of feeling weak, fatigued and tired she is regaining a lot of her activities. She is eating better at this time and keeping food down without any nausea or vomiting.  She continues to tolerate Xeloda without complications. She did not miss any doses despite her recent illness. Does not report any other side effects related to this medication.  She has not had any headaches or blurry vision. She does not report any syncope or seizures. She does not report any chest pain, shortness of  breath, cough or hemoptysis. She does not report any palpitation, orthopnea or PND. She does not report any leg edema. She denies abdominal pain, constipation or obstruction. She does not report any frequency urgency or hesitancy. She is not abort any skeletal complaints. Rest of the review of systems unremarkable.     Medications: I have reviewed the patient's current medications.  Current Outpatient Prescriptions  Medication Sig Dispense Refill  . acetaminophen (TYLENOL) 325 MG tablet Take 2 tablets (650 mg total) by mouth every 6 (six) hours as needed for mild pain (or Fever >/= 101).    . capecitabine (XELODA) 500 MG tablet Take 1 tablet in the morning and 2 tablets in the evening after meals for 14 days then 7 days off. 42 tablet 0  . cholecalciferol (VITAMIN D) 1000 UNITS tablet Take 2,000 Units by mouth daily.    . clobetasol cream (TEMOVATE) [id number] % Apply 1 application topically 2 (two) times daily. 30 g 0  . feeding supplement, RESOURCE BREEZE, (RESOURCE BREEZE) LIQD Take 1 Container by mouth 2 (two) times daily between meals.  0  . gabapentin (NEURONTIN) 100 MG capsule Take 1 capsule (100 mg total) by mouth 3 (three) times daily. 90 capsule 4  . levothyroxine (SYNTHROID, LEVOTHROID) 25 MCG tablet Take 25 mcg by mouth See admin instructions. 25 mcg  on Monday, Wednesday, Friday and Sunday.    . levothyroxine (SYNTHROID, LEVOTHROID) 75 MCG tablet Take 1 tablet by mouth See admin instructions. 73mcg on Tuesday, Thursday, Saturday.    . magnesium chloride (SLOW-MAG) 64 MG TBEC SR tablet  Take 1 tablet (64 mg total) by mouth daily. 30 tablet 2  . ondansetron (ZOFRAN) 8 MG tablet Take 1 tablet (8 mg total) by mouth every 8 (eight) hours as needed for nausea or vomiting. 20 tablet 0  . polyethylene glycol (MIRALAX / GLYCOLAX) packet Take 17 g by mouth every 12 (twelve) hours as needed for mild constipation, moderate constipation or severe constipation. 14 each 0  . Probiotic Product (RISA-BID  PROBIOTIC PO) Take 1 tablet by mouth 2 (two) times daily.    . prochlorperazine (COMPAZINE) 10 MG tablet Take 1 tablet (10 mg total) by mouth every 6 (six) hours as needed for nausea or vomiting. 30 tablet 0  . rivaroxaban (XARELTO) 20 MG TABS tablet Take 1 tablet (20 mg total) by mouth daily with supper. 30 tablet 0   No current facility-administered medications for this visit.     Allergies:  Allergies  Allergen Reactions  . Cataflam [Diclofenac] Other (See Comments)    Unknown; patient is unaware of allergy.  . Cephalosporins Shortness Of Breath  . Erythromycin Shortness Of Breath  . Keflex [Cephalexin] Shortness Of Breath  . Propoxyphene     Unknown rxn  . Codeine Nausea And Vomiting  . Naproxen Other (See Comments)    Unknown; patient is unaware of allergy  . Penicillins Other (See Comments)    Patient states that she CAN take this; unaware of allergy  . Synalgos-Dc [Aspirin-Caff-Dihydrocodeine] Other (See Comments)    Unknown; patient is unaware of allergy  . Ultram [Tramadol] Other (See Comments)    Unknown; patient is unaware of allergy    Past Medical History, Surgical history, Social history, and Family History were reviewed and updated.   Physical Exam: Blood pressure 161/58, pulse 58, temperature 98.2 F (36.8 C), temperature source Oral, resp. rate 18, height 5\' 6"  (1.676 m), weight 123 lb 14.4 oz (56.201 kg), SpO2 99 %. ECOG: 2 General appearance: Chronically ill-appearing woman without distress. Head: Normocephalic, without obvious abnormality  No oral thrush noted. Oral mucosa is moist and pink. Neck: no adenopathy or thyroid masses. Lymph nodes: Cervical, supraclavicular, and axillary nodes normal. Heart: Irregular without murmurs, rubs or gallops. Lung:chest clear, no wheezing, rales, normal symmetric air entry.  Abdomen: soft, non-tender, without masses or organomegaly. No rebound or guarding.  EXT: Slight erythema noted on her palms. Edema noted at her  ankles at 1+. Neurological: No ambulation difficulties. No deficits noted on exam.   Lab Results: Lab Results  Component Value Date   WBC 3.7* 07/04/2015   HGB 11.7 07/04/2015   HCT 34.4* 07/04/2015   MCV 105.5* 07/04/2015   PLT 166 07/04/2015     Chemistry      Component Value Date/Time   NA 141 06/05/2015 1103   NA 141 05/18/2014 0340   K 3.8 06/05/2015 1103   K 4.1 05/23/2014 0430   CL 102 05/18/2014 0340   CO2 31* 06/05/2015 1103   CO2 33* 05/18/2014 0340   BUN 18.9 06/05/2015 1103   BUN 14 05/18/2014 0340   CREATININE 0.7 06/05/2015 1103   CREATININE 0.52 05/22/2014 0558      Component Value Date/Time   CALCIUM 9.6 06/05/2015 1103   CALCIUM 8.4 05/18/2014 0340   ALKPHOS 117 06/05/2015 1103   ALKPHOS 62 01/23/2014 0511   AST 24 06/05/2015 1103   AST 15 01/23/2014 0511   ALT 19 06/05/2015 1103   ALT 10 01/23/2014 0511   BILITOT 1.03 06/05/2015 1103   BILITOT 0.7 01/23/2014 0511  Impression and Plan:  82 year old woman with the following issues:   1. Rectal cancer presented with hematochezia and status post colonoscopy and EUS. The care of by EUS criteria was 4 cm in length around 4-5 cm from the anal verge. The staging was T3 N0 M1 with possible liver metastasis. She is status post radiation therapy with oral Xeloda with still residual tumor as evident by the PET scan results.   She is status post surgical resection with a pathology revealed invasive adenocarcinoma through the muscularis propria. There are 2 extramural satellite tumor nodules. 12 lymph nodes were negative for metastatic cancer.  PET CT scan on 09/05/2014 showed progression of disease with disease in the liver and lung nodules.   She is currently on Xeloda and have tolerated it well so far. PET CT scan on 05/01/2015 showed  some progression of disease but for the most part stable disease With some mild progression.  Risks and benefits of continuing Xeloda at at the current  dose was reviewed again and she is willing to continue. Plan is to repeat imaging studies in August 2017.   2. Hepatic masses. Liver directed therapy can be an option if progression noted on the next imaging study..  3. History of pulmonary embolism: She is on full dose anticoagulation with Xarelto. No bleeding noted or interaction with Xeloda.  4. Vomiting and diarrhea: Likely related to an acute gastrointestinal viral infection which have resolved at this time. I doubt this is related to Xeloda but certainly a possibility.  5. Follow-up: Will be to 4 weeks to assess complications of her next cycle of chemotherapy.  Zola Button, MD 5/25/20173:22 PM

## 2015-07-04 NOTE — Telephone Encounter (Signed)
Gave and printed appt sched and avs for pt for June °

## 2015-07-15 ENCOUNTER — Other Ambulatory Visit: Payer: Self-pay | Admitting: Neurology

## 2015-07-17 ENCOUNTER — Other Ambulatory Visit: Payer: Self-pay | Admitting: *Deleted

## 2015-07-17 DIAGNOSIS — C20 Malignant neoplasm of rectum: Secondary | ICD-10-CM

## 2015-07-17 DIAGNOSIS — C787 Secondary malignant neoplasm of liver and intrahepatic bile duct: Secondary | ICD-10-CM

## 2015-07-17 MED ORDER — CAPECITABINE 500 MG PO TABS: ORAL_TABLET | ORAL | Status: DC

## 2015-07-30 ENCOUNTER — Telehealth: Payer: Self-pay | Admitting: Oncology

## 2015-07-30 NOTE — Telephone Encounter (Signed)
PAL---- moved 6/30 lab/FS from 6/30 to 6/22 @ 2:15 pm for lab and 3:15 pm for FS.

## 2015-07-30 NOTE — Telephone Encounter (Signed)
Spoke with patient she is aware of appointment change below.

## 2015-07-31 ENCOUNTER — Inpatient Hospital Stay (HOSPITAL_COMMUNITY)
Admission: EM | Admit: 2015-07-31 | Discharge: 2015-08-12 | DRG: 371 | Disposition: A | Discharge status: Home Health | Payer: Medicare Other | Attending: Internal Medicine | Admitting: Internal Medicine

## 2015-07-31 ENCOUNTER — Encounter (HOSPITAL_COMMUNITY): Payer: Self-pay

## 2015-07-31 DIAGNOSIS — F419 Anxiety disorder, unspecified: Secondary | ICD-10-CM | POA: Diagnosis present

## 2015-07-31 DIAGNOSIS — F418 Other specified anxiety disorders: Secondary | ICD-10-CM

## 2015-07-31 DIAGNOSIS — M5410 Radiculopathy, site unspecified: Secondary | ICD-10-CM | POA: Diagnosis present

## 2015-07-31 DIAGNOSIS — Z833 Family history of diabetes mellitus: Secondary | ICD-10-CM

## 2015-07-31 DIAGNOSIS — M5416 Radiculopathy, lumbar region: Secondary | ICD-10-CM | POA: Diagnosis present

## 2015-07-31 DIAGNOSIS — F32A Depression, unspecified: Secondary | ICD-10-CM | POA: Diagnosis present

## 2015-07-31 DIAGNOSIS — I10 Essential (primary) hypertension: Secondary | ICD-10-CM | POA: Diagnosis present

## 2015-07-31 DIAGNOSIS — A047 Enterocolitis due to Clostridium difficile: Secondary | ICD-10-CM | POA: Diagnosis not present

## 2015-07-31 DIAGNOSIS — Z808 Family history of malignant neoplasm of other organs or systems: Secondary | ICD-10-CM

## 2015-07-31 DIAGNOSIS — R197 Diarrhea, unspecified: Secondary | ICD-10-CM | POA: Diagnosis not present

## 2015-07-31 DIAGNOSIS — Z7901 Long term (current) use of anticoagulants: Secondary | ICD-10-CM

## 2015-07-31 DIAGNOSIS — N838 Other noninflammatory disorders of ovary, fallopian tube and broad ligament: Secondary | ICD-10-CM | POA: Diagnosis present

## 2015-07-31 DIAGNOSIS — Z8249 Family history of ischemic heart disease and other diseases of the circulatory system: Secondary | ICD-10-CM

## 2015-07-31 DIAGNOSIS — R531 Weakness: Secondary | ICD-10-CM | POA: Insufficient documentation

## 2015-07-31 DIAGNOSIS — E43 Unspecified severe protein-calorie malnutrition: Secondary | ICD-10-CM | POA: Diagnosis present

## 2015-07-31 DIAGNOSIS — Z681 Body mass index (BMI) 19 or less, adult: Secondary | ICD-10-CM

## 2015-07-31 DIAGNOSIS — E876 Hypokalemia: Secondary | ICD-10-CM | POA: Diagnosis present

## 2015-07-31 DIAGNOSIS — A0472 Enterocolitis due to Clostridium difficile, not specified as recurrent: Secondary | ICD-10-CM | POA: Diagnosis present

## 2015-07-31 DIAGNOSIS — Z933 Colostomy status: Secondary | ICD-10-CM

## 2015-07-31 DIAGNOSIS — E860 Dehydration: Secondary | ICD-10-CM | POA: Diagnosis present

## 2015-07-31 DIAGNOSIS — K219 Gastro-esophageal reflux disease without esophagitis: Secondary | ICD-10-CM | POA: Diagnosis present

## 2015-07-31 DIAGNOSIS — C7800 Secondary malignant neoplasm of unspecified lung: Secondary | ICD-10-CM | POA: Diagnosis present

## 2015-07-31 DIAGNOSIS — C787 Secondary malignant neoplasm of liver and intrahepatic bile duct: Secondary | ICD-10-CM | POA: Diagnosis present

## 2015-07-31 DIAGNOSIS — Z90710 Acquired absence of both cervix and uterus: Secondary | ICD-10-CM

## 2015-07-31 DIAGNOSIS — Z923 Personal history of irradiation: Secondary | ICD-10-CM

## 2015-07-31 DIAGNOSIS — E039 Hypothyroidism, unspecified: Secondary | ICD-10-CM | POA: Diagnosis present

## 2015-07-31 DIAGNOSIS — C20 Malignant neoplasm of rectum: Secondary | ICD-10-CM | POA: Diagnosis present

## 2015-07-31 DIAGNOSIS — Z86711 Personal history of pulmonary embolism: Secondary | ICD-10-CM | POA: Diagnosis present

## 2015-07-31 DIAGNOSIS — F329 Major depressive disorder, single episode, unspecified: Secondary | ICD-10-CM | POA: Diagnosis present

## 2015-07-31 DIAGNOSIS — Z9221 Personal history of antineoplastic chemotherapy: Secondary | ICD-10-CM

## 2015-07-31 DIAGNOSIS — Z66 Do not resuscitate: Secondary | ICD-10-CM | POA: Diagnosis present

## 2015-07-31 DIAGNOSIS — E440 Moderate protein-calorie malnutrition: Secondary | ICD-10-CM | POA: Diagnosis present

## 2015-07-31 DIAGNOSIS — I2782 Chronic pulmonary embolism: Secondary | ICD-10-CM

## 2015-07-31 LAB — URINALYSIS, ROUTINE W REFLEX MICROSCOPIC
Glucose, UA: NEGATIVE mg/dL
Ketones, ur: NEGATIVE mg/dL
Nitrite: NEGATIVE
Protein, ur: NEGATIVE mg/dL
SPECIFIC GRAVITY, URINE: 1.02 (ref 1.005–1.030)
pH: 5.5 (ref 5.0–8.0)

## 2015-07-31 LAB — C DIFFICILE QUICK SCREEN W PCR REFLEX
C DIFFICILE (CDIFF) INTERP: NEGATIVE
C DIFFICILE (CDIFF) TOXIN: NEGATIVE
C DIFFICLE (CDIFF) ANTIGEN: NEGATIVE

## 2015-07-31 LAB — BASIC METABOLIC PANEL
Anion gap: 5 (ref 5–15)
BUN: 25 mg/dL — ABNORMAL HIGH (ref 6–20)
CO2: 25 mmol/L (ref 22–32)
Calcium: 9.3 mg/dL (ref 8.9–10.3)
Chloride: 108 mmol/L (ref 101–111)
Creatinine, Ser: 0.53 mg/dL (ref 0.44–1.00)
GFR calc Af Amer: >60 mL/min (ref >60)
GFR calc non Af Amer: >60 mL/min (ref >60)
Glucose, Bld: 114 mg/dL — ABNORMAL HIGH (ref 65–99)
Potassium: 3 mmol/L — ABNORMAL LOW (ref 3.5–5.1)
Sodium: 138 mmol/L (ref 135–145)

## 2015-07-31 LAB — CBC WITH DIFFERENTIAL/PLATELET
Basophils Absolute: 0 10*3/uL (ref 0.0–0.1)
Basophils Relative: 0 %
Eosinophils Absolute: 0 10*3/uL (ref 0.0–0.7)
Eosinophils Relative: 1 %
HCT: 33.9 % — ABNORMAL LOW (ref 36.0–46.0)
Hemoglobin: 12.1 g/dL (ref 12.0–15.0)
Lymphocytes Relative: 7 %
Lymphs Abs: 0.3 10*3/uL — ABNORMAL LOW (ref 0.7–4.0)
MCH: 36.6 pg — ABNORMAL HIGH (ref 26.0–34.0)
MCHC: 35.7 g/dL (ref 30.0–36.0)
MCV: 102.4 fL — ABNORMAL HIGH (ref 78.0–100.0)
Monocytes Absolute: 0.5 10*3/uL (ref 0.1–1.0)
Monocytes Relative: 12 %
Neutro Abs: 3.7 10*3/uL (ref 1.7–7.7)
Neutrophils Relative %: 80 %
Platelets: 168 10*3/uL (ref 150–400)
RBC: 3.31 MIL/uL — ABNORMAL LOW (ref 3.87–5.11)
RDW: 16.4 % — ABNORMAL HIGH (ref 11.5–15.5)
WBC: 4.6 10*3/uL (ref 4.0–10.5)

## 2015-07-31 LAB — URINE MICROSCOPIC-ADD ON

## 2015-07-31 LAB — PROTIME-INR
INR: 1.9 — AB (ref 0.00–1.49)
Prothrombin Time: 21.7 seconds — ABNORMAL HIGH (ref 11.6–15.2)

## 2015-07-31 LAB — PROCALCITONIN: PROCALCITONIN: 0.13 ng/mL

## 2015-07-31 LAB — APTT: APTT: 30 s (ref 24–37)

## 2015-07-31 LAB — MAGNESIUM: Magnesium: 1.5 mg/dL — ABNORMAL LOW (ref 1.7–2.4)

## 2015-07-31 LAB — LACTIC ACID, PLASMA: Lactic Acid, Venous: 1.7 mmol/L (ref 0.5–2.0)

## 2015-07-31 MED ORDER — FOLIC ACID 1 MG PO TABS
1.0000 mg | ORAL_TABLET | Freq: Every day | ORAL | Status: DC
  Administered 2015-08-01 – 2015-08-12 (×11): 1 mg via ORAL
  Filled 2015-07-31 (×11): qty 1

## 2015-07-31 MED ORDER — SODIUM CHLORIDE 0.9 % IV SOLN
INTRAVENOUS | Status: DC
Start: 2015-07-31 — End: 2015-08-03
  Administered 2015-08-01 – 2015-08-03 (×4): via INTRAVENOUS

## 2015-07-31 MED ORDER — BOOST HIGH PROTEIN PO LIQD
1.0000 | Freq: Three times a day (TID) | ORAL | Status: DC | PRN
  Filled 2015-07-31: qty 237

## 2015-07-31 MED ORDER — SODIUM CHLORIDE 0.9 % IV BOLUS (SEPSIS)
1000.0000 mL | Freq: Once | INTRAVENOUS | Status: AC
  Administered 2015-07-31: 1000 mL via INTRAVENOUS

## 2015-07-31 MED ORDER — GABAPENTIN 100 MG PO CAPS
100.0000 mg | ORAL_CAPSULE | Freq: Three times a day (TID) | ORAL | Status: DC
  Administered 2015-07-31 – 2015-08-12 (×36): 100 mg via ORAL
  Filled 2015-07-31 (×39): qty 1

## 2015-07-31 MED ORDER — LEVOTHYROXINE SODIUM 75 MCG PO TABS
75.0000 ug | ORAL_TABLET | ORAL | Status: DC
  Administered 2015-08-01 – 2015-08-08 (×4): 75 ug via ORAL
  Filled 2015-07-31 (×8): qty 1

## 2015-07-31 MED ORDER — CHOLESTYRAMINE 4 G PO PACK
4.0000 g | PACK | ORAL | Status: DC
  Administered 2015-08-01: 4 g via ORAL
  Filled 2015-07-31 (×2): qty 1

## 2015-07-31 MED ORDER — MAGNESIUM CHLORIDE 64 MG PO TBEC
1.0000 | DELAYED_RELEASE_TABLET | Freq: Every day | ORAL | Status: DC
  Filled 2015-07-31: qty 1

## 2015-07-31 MED ORDER — VANCOMYCIN 50 MG/ML ORAL SOLUTION
125.0000 mg | Freq: Once | ORAL | Status: AC
  Administered 2015-07-31: 125 mg via ORAL
  Filled 2015-07-31: qty 2.5

## 2015-07-31 MED ORDER — MAGNESIUM SULFATE 2 GM/50ML IV SOLN
2.0000 g | Freq: Once | INTRAVENOUS | Status: AC
  Administered 2015-07-31: 2 g via INTRAVENOUS
  Filled 2015-07-31: qty 50

## 2015-07-31 MED ORDER — RIVAROXABAN 20 MG PO TABS
20.0000 mg | ORAL_TABLET | Freq: Every day | ORAL | Status: DC
  Administered 2015-07-31 – 2015-08-11 (×11): 20 mg via ORAL
  Filled 2015-07-31 (×13): qty 1

## 2015-07-31 MED ORDER — PEDIALYTE PO SOLN: 240.0000 mL | Freq: Three times a day (TID) | ORAL | Status: DC | PRN

## 2015-07-31 MED ORDER — LEVOTHYROXINE SODIUM 25 MCG PO TABS
25.0000 ug | ORAL_TABLET | ORAL | Status: DC
  Administered 2015-08-02 – 2015-08-12 (×7): 25 ug via ORAL
  Filled 2015-07-31 (×6): qty 1

## 2015-07-31 MED ORDER — ONDANSETRON HCL 4 MG/2ML IJ SOLN
4.0000 mg | Freq: Four times a day (QID) | INTRAMUSCULAR | Status: DC | PRN
  Administered 2015-08-01 – 2015-08-09 (×3): 4 mg via INTRAVENOUS
  Filled 2015-07-31 (×3): qty 2

## 2015-07-31 MED ORDER — METRONIDAZOLE IN NACL 5-0.79 MG/ML-% IV SOLN
500.0000 mg | Freq: Three times a day (TID) | INTRAVENOUS | Status: DC
  Administered 2015-07-31 – 2015-08-03 (×9): 500 mg via INTRAVENOUS
  Filled 2015-07-31 (×10): qty 100

## 2015-07-31 MED ORDER — CLOBETASOL PROPIONATE 0.05 % EX CREA: 1.0000 "application " | TOPICAL_CREAM | Freq: Two times a day (BID) | CUTANEOUS | Status: DC | PRN

## 2015-07-31 MED ORDER — VITAMIN B-12 1000 MCG PO TABS
2000.0000 ug | ORAL_TABLET | Freq: Every day | ORAL | Status: DC
  Administered 2015-08-01 – 2015-08-12 (×11): 2000 ug via ORAL
  Filled 2015-07-31 (×11): qty 2

## 2015-07-31 MED ORDER — POTASSIUM CHLORIDE 10 MEQ/100ML IV SOLN
10.0000 meq | INTRAVENOUS | Status: AC
Start: 2015-07-31 — End: 2015-07-31
  Administered 2015-07-31 (×3): 10 meq via INTRAVENOUS
  Filled 2015-07-31 (×2): qty 100

## 2015-07-31 MED ORDER — ACETAMINOPHEN 325 MG PO TABS
650.0000 mg | ORAL_TABLET | Freq: Four times a day (QID) | ORAL | Status: DC | PRN
  Administered 2015-08-03 – 2015-08-08 (×2): 650 mg via ORAL
  Filled 2015-07-31 (×2): qty 2

## 2015-07-31 MED ORDER — BOOST PLUS PO LIQD
237.0000 mL | Freq: Three times a day (TID) | ORAL | Status: DC | PRN
  Administered 2015-08-03: 237 mL via ORAL
  Filled 2015-07-31 (×2): qty 237

## 2015-07-31 MED ORDER — FAMOTIDINE 20 MG PO TABS
20.0000 mg | ORAL_TABLET | Freq: Every day | ORAL | Status: DC
  Administered 2015-07-31 – 2015-08-01 (×2): 20 mg via ORAL
  Filled 2015-07-31 (×2): qty 1

## 2015-07-31 MED ORDER — ONDANSETRON HCL 4 MG PO TABS: 4.0000 mg | ORAL_TABLET | Freq: Four times a day (QID) | ORAL | Status: DC | PRN

## 2015-07-31 MED ORDER — VANCOMYCIN 50 MG/ML ORAL SOLUTION
500.0000 mg | Freq: Four times a day (QID) | ORAL | Status: DC
  Administered 2015-07-31 – 2015-08-06 (×24): 500 mg via ORAL
  Filled 2015-07-31 (×27): qty 10

## 2015-07-31 MED ORDER — SODIUM CHLORIDE 0.9 % IV SOLN: INTRAVENOUS | Status: DC

## 2015-07-31 MED ORDER — VITAMIN D3 25 MCG (1000 UNIT) PO TABS
2000.0000 [IU] | ORAL_TABLET | Freq: Every day | ORAL | Status: DC
  Administered 2015-08-01 – 2015-08-05 (×5): 2000 [IU] via ORAL
  Filled 2015-07-31 (×6): qty 2

## 2015-07-31 NOTE — ED Notes (Signed)
Patient was diagnosed with c-diff at beginning of May and was prescribed Flagyl x14 days.  The diarrhea came back and patient took another 14 day course of Flagyl with no relief.  Patient was started on oral Vanc on 6/13.  Patient continues to have diarrhea and Monday saw GI doctor [place].  Patient has a colostomy bag.  Patient is putting out @2  liters of stool in bag a day.  Patient was directed to ED.

## 2015-07-31 NOTE — ED Provider Notes (Signed)
CSN: [id number]     Arrival date & time 07/31/15  1235 History   First MD Initiated Contact with Patient 07/31/15 1305     Chief Complaint  Patient presents with  . c diff      (Consider location/radiation/quality/duration/timing/severity/associated sxs/prior Treatment) HPI   82 year old female with diarrhea. History is primarily from review of records and her nephew who lives with her. She was hospitalized in November 2015 critically ill. Diagnoses included pulmonary embolism, septic shock and C. difficile. Never reports that she was treated for the C. difficile and has not had significant issues with regards to this until the beginning of May. She has a colostomy secondary to rectal cancer. She began putting out diffuse watery stool. She completing a 14 day course of Flagyl and seemed to improve for about a week after completing treatment. Her stool became increasingly watery again though she finished another 14 day course of vancomycin. Her symptoms did not change and she was started on 125 mg of vancomycin 4 times a day on 6/13. Despite this she has continued to have diffuse watery output. She was seen on Monday by Dr. Benson Norway, gastroenterology. He advised nephew to quantify her output. With the last 24 hours she has put out over 2 L. Patient herself says she has had intermittent abdominal pain. None currently. Her biggest complaint is that she feels "terrible." Generally very weak. Achy. She feels like she has no energy.  Past Medical History  Diagnosis Date  . Migraine   . GERD (gastroesophageal reflux disease)   . Hypercholesteremia   . Pulmonary embolism ([place]) 01/01/2012  / 11/28/2013  . Hypothyroidism   . Rectal mass   . Ovarian tumor     sees dr Lisbeth Renshaw ob-gyn yearly for evaluation  . Cancer ([place]) 09/29/13    rectal adenocarcinoma  . Rectal cancer ([place]) 09/29/13    invasive adeocarcinoma  . Hx of radiation therapy 10/25/13-11/28/13    pelvis, 4500 cGy in 25 sessions  . Incontinence of  urine   . Hypertension     stopped med during chemo /radiation - BP has been stable  . Numbness     "I have no feeling in my left leg" unknown cause  . Arthritis   . History of skin cancer     removed from forehead  . Rosacea   . Rectal cancer ([place])   . Frequent bowel movements   . Hx: UTI (urinary tract infection)   . Depression   . Anxiety   . Severe sepsis with septic shock ([place]) 11/28/2013  . UTI (lower urinary tract infection) 01/23/2014  . Acute encephalopathy 12/08/2013  . Clostridium difficile colitis 12/08/2013  . Cor pulmonale, acute ([place]) 01/02/2012  . Fall from standing 01/22/2014  . Hypokalemia 11/28/2013  . Dysphonia, spasmodic 05/18/2014   Past Surgical History  Procedure Laterality Date  . Ovarian cyst surgery  yrs ago  . Cataract extraction Bilateral   . Colonoscopy N/A 09/22/2013    Procedure: COLONOSCOPY;  Surgeon: Beryle Beams, MD;  Location: [facility];  Service: Endoscopy;  Laterality: N/A;  . Eus N/A 09/29/2013    Procedure: LOWER ENDOSCOPIC ULTRASOUND (EUS);  Surgeon: Beryle Beams, MD;  Location: [facility];  Service: Endoscopy;  Laterality: N/A;  . Abdominal hysterectomy      years ago, prolapsed uterus   Family History  Problem Relation Age of Onset  . Brain cancer Mother   . Heart attack Father   . Diabetes Sister   . Heart  disease Sister    Social History  Substance Use Topics  . Smoking status: Never Smoker   . Smokeless tobacco: Never Used  . Alcohol Use: No   OB History    No data available     Review of Systems  All systems reviewed and negative, other than as noted in HPI.   Allergies  Cataflam; Cephalosporins; Erythromycin; Keflex; Propoxyphene; Codeine; Naproxen; Penicillins; Synalgos-dc; and Ultram  Home Medications   Prior to Admission medications   Medication Sig Start Date End Date Taking? Authorizing Provider  acetaminophen (TYLENOL) 325 MG tablet Take 2 tablets (650 mg total) by mouth every 6 (six) hours as  needed for mild pain (or Fever >/= 101). 01/25/14  Yes Delfina Redwood, MD  capecitabine (XELODA) 500 MG tablet Take 1 tablet in the morning and 2 tablets in the evening after meals for 14 days then 7 days off. 07/17/15  Yes Wyatt Portela, MD  cholecalciferol (VITAMIN D) 1000 UNITS tablet Take 2,000 Units by mouth daily.   Yes Historical Provider, MD  cholestyramine (QUESTRAN) 4 g packet Take 4 g by mouth daily as needed. diarrhea 06/20/15  Yes Historical Provider, MD  clobetasol cream (TEMOVATE) [id number] % Apply 1 application topically 2 (two) times daily. Patient taking differently: Apply 1 application topically 2 (two) times daily as needed (irritation).  04/02/15  Yes Wyatt Portela, MD  feeding supplement (BOOST HIGH PROTEIN) LIQD Take 1 Container by mouth 3 (three) times daily as needed (appetite).   Yes Historical Provider, MD  folic acid (FOLVITE) [id number] MCG tablet Take 800 mcg by mouth daily.   Yes Historical Provider, MD  gabapentin (NEURONTIN) 100 MG capsule Take 1 capsule (100 mg total) by mouth 3 (three) times daily. 03/08/15  Yes Melvenia Beam, MD  levothyroxine (SYNTHROID, LEVOTHROID) 25 MCG tablet Take 25 mcg by mouth See admin instructions. 25 mcg  on Monday, Wednesday, Friday and Sunday.   Yes Historical Provider, MD  levothyroxine (SYNTHROID, LEVOTHROID) 75 MCG tablet Take 1 tablet by mouth See admin instructions. 47mcg on Tuesday, Thursday, Saturday. 02/22/14  Yes Historical Provider, MD  magnesium chloride (SLOW-MAG) 64 MG TBEC SR tablet Take 1 tablet (64 mg total) by mouth daily. 05/23/14  Yes Michael Boston, MD  ondansetron (ZOFRAN) 8 MG tablet Take 1 tablet (8 mg total) by mouth every 8 (eight) hours as needed for nausea or vomiting. 06/03/15  Yes Maryanna Shape, NP  PEDIALYTE (PEDIALYTE) SOLN Take 240 mLs by mouth 3 (three) times daily as needed (poor appetite).   Yes Historical Provider, MD  polyethylene glycol (MIRALAX / GLYCOLAX) packet Take 17 g by mouth every 12 (twelve) hours as  needed for mild constipation, moderate constipation or severe constipation. 05/23/14  Yes Michael Boston, MD  Probiotic Product (RISA-BID PROBIOTIC PO) Take 1 tablet by mouth 2 (two) times daily. 11/22/14  Yes Historical Provider, MD  prochlorperazine (COMPAZINE) 10 MG tablet Take 1 tablet (10 mg total) by mouth every 6 (six) hours as needed for nausea or vomiting. 06/03/15  Yes Maryanna Shape, NP  rivaroxaban (XARELTO) 20 MG TABS tablet Take 1 tablet (20 mg total) by mouth daily with supper. 12/21/13  Yes Maryann Mikhail, DO  vancomycin (VANCOCIN) 125 MG capsule Take 125 mg by mouth every 6 (six) hours.   Yes Historical Provider, MD  vitamin B-12 (CYANOCOBALAMIN) 1000 MCG tablet Place 2,000 mcg under the tongue daily.   Yes Historical Provider, MD  feeding supplement, RESOURCE BREEZE, (RESOURCE BREEZE) LIQD Take  1 Container by mouth 2 (two) times daily between meals. Patient not taking: Reported on 07/31/2015 01/25/14   Delfina Redwood, MD   BP 147/77 mmHg  Pulse 92  Temp(Src) 98 F (36.7 C) (Oral)  Resp 18  SpO2 100% Physical Exam  Constitutional: She appears well-developed and well-nourished. No distress.  HENT:  Head: Normocephalic and atraumatic.  Eyes: Conjunctivae are normal. Right eye exhibits no discharge. Left eye exhibits no discharge.  Neck: Neck supple.  Cardiovascular: Normal rate, regular rhythm and normal heart sounds.  Exam reveals no gallop and no friction rub.   No murmur heard. Pulmonary/Chest: Effort normal and breath sounds normal. No respiratory distress.  Abdominal: Soft. She exhibits no distension. There is no tenderness.  Ostomy with healthy-appearing stoma. Air and watery brown stool in bag. Abdomen soft and mildly tender diffusely.  Musculoskeletal: She exhibits no edema or tenderness.  Neurological: She is alert.  Skin: Skin is warm and dry.  Psychiatric: She has a normal mood and affect. Her behavior is normal. Thought content normal.  Nursing note and  vitals reviewed.   ED Course  Procedures (including critical care time) Labs Review Labs Reviewed  CBC WITH DIFFERENTIAL/PLATELET - Abnormal; Notable for the following:    RBC 3.31 (*)    HCT 33.9 (*)    MCV 102.4 (*)    MCH 36.6 (*)    RDW 16.4 (*)    Lymphs Abs 0.3 (*)    All other components within normal limits  BASIC METABOLIC PANEL - Abnormal; Notable for the following:    Potassium 3.0 (*)    Glucose, Bld 114 (*)    BUN 25 (*)    All other components within normal limits  MAGNESIUM - Abnormal; Notable for the following:    Magnesium 1.5 (*)    All other components within normal limits  URINALYSIS, ROUTINE W REFLEX MICROSCOPIC (NOT AT [facility]) - Abnormal; Notable for the following:    Hgb urine dipstick SMALL (*)    Bilirubin Urine SMALL (*)    Leukocytes, UA SMALL (*)    All other components within normal limits  PROTIME-INR - Abnormal; Notable for the following:    Prothrombin Time 21.7 (*)    INR 1.90 (*)    All other components within normal limits  URINE MICROSCOPIC-ADD ON - Abnormal; Notable for the following:    Squamous Epithelial / LPF 0-5 (*)    Bacteria, UA FEW (*)    Casts HYALINE CASTS (*)    Crystals CA OXALATE CRYSTALS (*)    All other components within normal limits  BASIC METABOLIC PANEL - Abnormal; Notable for the following:    Potassium 2.8 (*)    Chloride 116 (*)    Calcium 8.1 (*)    Anion gap 2 (*)    All other components within normal limits  CBC - Abnormal; Notable for the following:    RBC 2.85 (*)    Hemoglobin 10.5 (*)    HCT 30.0 (*)    MCV 105.3 (*)    MCH 36.8 (*)    RDW 16.5 (*)    All other components within normal limits  BASIC METABOLIC PANEL - Abnormal; Notable for the following:    Potassium 2.9 (*)    Chloride 116 (*)    CO2 21 (*)    Creatinine, Ser 0.40 (*)    Calcium 8.1 (*)    Anion gap 4 (*)    All other components within normal limits  CBC -  Abnormal; Notable for the following:    RBC 2.94 (*)    Hemoglobin  10.8 (*)    HCT 31.1 (*)    MCV 105.8 (*)    MCH 36.7 (*)    RDW 16.6 (*)    Platelets 139 (*)    All other components within normal limits  MAGNESIUM - Abnormal; Notable for the following:    Magnesium 1.1 (*)    All other components within normal limits  BASIC METABOLIC PANEL - Abnormal; Notable for the following:    Potassium 2.6 (*)    Chloride 114 (*)    Calcium 8.5 (*)    All other components within normal limits  BASIC METABOLIC PANEL - Abnormal; Notable for the following:    Potassium 2.6 (*)    Chloride 112 (*)    Glucose, Bld 102 (*)    Calcium 8.6 (*)    All other components within normal limits  MAGNESIUM - Abnormal; Notable for the following:    Magnesium 1.4 (*)    All other components within normal limits  MAGNESIUM - Abnormal; Notable for the following:    Magnesium 1.6 (*)    All other components within normal limits  BASIC METABOLIC PANEL - Abnormal; Notable for the following:    Glucose, Bld 104 (*)    Creatinine, Ser 0.34 (*)    Calcium 8.3 (*)    Anion gap 3 (*)    All other components within normal limits  CULTURE, BLOOD (ROUTINE X 2)  CULTURE, BLOOD (ROUTINE X 2)  GASTROINTESTINAL PANEL BY PCR, STOOL (REPLACES STOOL CULTURE)  C DIFFICILE QUICK SCREEN W PCR REFLEX  LACTIC ACID, PLASMA  PROCALCITONIN  APTT  MAGNESIUM  MAGNESIUM    Imaging Review No results found. I have personally reviewed and evaluated these images and lab results as part of my medical decision-making.   EKG Interpretation None      MDM   Final diagnoses:  Diarrhea, unspecified type  Hypokalemia  Hypomagnesemia  Generalized weakness    82 year old female with continued diffuse diarrhea. Diagnosis of C. difficile at the beginning of May. She has now completed two 14 day courses of Flagyl and has been on oral vancomycin since 6/13 but still has had significant watety and has been increasingly weak.  Will place IV. Hydration. Check electrolytes. At this point  anticipate admission.     Virgel Manifold, MD 08/07/15 1432

## 2015-07-31 NOTE — H&P (Signed)
History and Physical    Bethany [name] [id number] DOB: [birth year] DOA: 07/31/2015  Referring MD/NP/PA:   PCP: [name], MD   Patient coming from:  The patient is coming from home.  At baseline, pt is independent for most of ADL.  Chief Complaint: Diarrhea and abdominal pain  HPI: Bethany [name] is a 82 y.o. female with medical history significant of c diff colitis, hypertension, hyperlipidemia, GERD, hypothyroidism, depression, sciatica, migraine headache, PE on Xarelto, skin cancer, rectal cancer (s/p of surgery and radiation therapy, currently on chemotherapy), s/p of colostomy, who presents with diarrhea and abdominal pain.  Patient states that she was diagnosed as C. difficile colitis in May, she was treated with 10 days of Flagyl, got better for 1 week, then had recurrent diarrhea. She was again treated with 10 days of Flagyl , but she continues to have diarrhea and abdominal pain. She was started with oral Vancomycine on 07/23/15. She was seen by GI, Dr. Benson Norway on Monday and recommended tocontinue with oral vancomycin and monitor colostomy output. Per her nephew, pt has had approximately 2L of colostomy output per day. Patient has mild abdominal pain, but no nausea, no fever or chills. She does not have chest pain, shortness breath, cough, symptoms of UTI. She has generalized weakness, but no unilateral weakness.  ED Course: pt was found to have WBC 4.6, lactic acid 1.7, temperature normal, no tachycardia, no tachypnea, potassium 3.0, creatinine normal. Patient is placed on MedSurg bed for observation.  Review of Systems:   General: no fevers, chills, no changes in body weight, has poor appetite, has fatigue HEENT: no blurry vision, hearing changes or sore throat Pulm: no dyspnea, coughing, wheezing CV: no chest pain, no palpitations Abd: no nausea, vomiting, has abdominal pain, diarrhea, no constipation GU: no dysuria, burning on urination, increased urinary frequency, hematuria  Ext: no  leg edema Neuro: no unilateral weakness, numbness, or tingling, no vision change or hearing loss Skin: no rash MSK: No muscle spasm, no deformity, no limitation of range of movement in spin Heme: No easy bruising.  Travel history: No recent long distant travel.  Allergy:  Allergies  Allergen Reactions  . Cataflam [Diclofenac] Other (See Comments)    Unknown; patient is unaware of allergy.  . Cephalosporins Shortness Of Breath  . Erythromycin Shortness Of Breath  . Keflex [Cephalexin] Shortness Of Breath  . Propoxyphene     Unknown rxn  . Codeine Nausea And Vomiting  . Naproxen Other (See Comments)    Unknown; patient is unaware of allergy  . Penicillins Other (See Comments)    Patient states that she CAN take this; unaware of allergy, pt/family is unable to answer penicillin related questions as they do not recall this allergy reaction at all  . Synalgos-Dc [Aspirin-Caff-Dihydrocodeine] Other (See Comments)    Unknown; patient is unaware of allergy  . Ultram [Tramadol] Other (See Comments)    Unknown; patient is unaware of allergy    Past Medical History  Diagnosis Date  . Migraine   . GERD (gastroesophageal reflux disease)   . Hypercholesteremia   . Pulmonary embolism ([place]) 01/01/2012  / 11/28/2013  . Hypothyroidism   . Rectal mass   . Ovarian tumor     sees dr Lisbeth Renshaw ob-gyn yearly for evaluation  . Cancer ([place]) 09/29/13    rectal adenocarcinoma  . Rectal cancer ([place]) 09/29/13    invasive adeocarcinoma  . Hx of radiation therapy 10/25/13-11/28/13    pelvis, 4500 cGy in 25 sessions  .  Incontinence of urine   . Hypertension     stopped med during chemo /radiation - BP has been stable  . Numbness     "I have no feeling in my left leg" unknown cause  . Arthritis   . History of skin cancer     removed from forehead  . Rosacea   . Rectal cancer ([place])   . Frequent bowel movements   . Hx: UTI (urinary tract infection)   . Depression   . Anxiety   . Severe sepsis with  septic shock ([place]) 11/28/2013  . UTI (lower urinary tract infection) 01/23/2014  . Acute encephalopathy 12/08/2013  . Clostridium difficile colitis 12/08/2013  . Cor pulmonale, acute ([place]) 01/02/2012  . Fall from standing 01/22/2014  . Hypokalemia 11/28/2013  . Dysphonia, spasmodic 05/18/2014    Past Surgical History  Procedure Laterality Date  . Ovarian cyst surgery  yrs ago  . Cataract extraction Bilateral   . Colonoscopy N/A 09/22/2013    Procedure: COLONOSCOPY;  Surgeon: Beryle Beams, MD;  Location: [facility];  Service: Endoscopy;  Laterality: N/A;  . Eus N/A 09/29/2013    Procedure: LOWER ENDOSCOPIC ULTRASOUND (EUS);  Surgeon: Beryle Beams, MD;  Location: [facility];  Service: Endoscopy;  Laterality: N/A;  . Abdominal hysterectomy      years ago, prolapsed uterus    Social History:  reports that she has never smoked. She has never used smokeless tobacco. She reports that she does not drink alcohol or use illicit drugs.  Family History:  Family History  Problem Relation Age of Onset  . Brain cancer Mother   . Heart attack Father   . Diabetes Sister   . Heart disease Sister      Prior to Admission medications   Medication Sig Start Date End Date Taking? Authorizing Provider  acetaminophen (TYLENOL) 325 MG tablet Take 2 tablets (650 mg total) by mouth every 6 (six) hours as needed for mild pain (or Fever >/= 101). 01/25/14  Yes Delfina Redwood, MD  capecitabine (XELODA) 500 MG tablet Take 1 tablet in the morning and 2 tablets in the evening after meals for 14 days then 7 days off. 07/17/15  Yes Wyatt Portela, MD  cholecalciferol (VITAMIN D) 1000 UNITS tablet Take 2,000 Units by mouth daily.   Yes Historical Provider, MD  cholestyramine (QUESTRAN) 4 g packet Take 4 g by mouth daily as needed. diarrhea 06/20/15  Yes Historical Provider, MD  clobetasol cream (TEMOVATE) [id number] % Apply 1 application topically 2 (two) times daily. Patient taking differently: Apply 1  application topically 2 (two) times daily as needed (irritation).  04/02/15  Yes Wyatt Portela, MD  feeding supplement (BOOST HIGH PROTEIN) LIQD Take 1 Container by mouth 3 (three) times daily as needed (appetite).   Yes Historical Provider, MD  folic acid (FOLVITE) [id number] MCG tablet Take 800 mcg by mouth daily.   Yes Historical Provider, MD  gabapentin (NEURONTIN) 100 MG capsule Take 1 capsule (100 mg total) by mouth 3 (three) times daily. 03/08/15  Yes Melvenia Beam, MD  levothyroxine (SYNTHROID, LEVOTHROID) 25 MCG tablet Take 25 mcg by mouth See admin instructions. 25 mcg  on Monday, Wednesday, Friday and Sunday.   Yes Historical Provider, MD  levothyroxine (SYNTHROID, LEVOTHROID) 75 MCG tablet Take 1 tablet by mouth See admin instructions. 61mcg on Tuesday, Thursday, Saturday. 02/22/14  Yes Historical Provider, MD  magnesium chloride (SLOW-MAG) 64 MG TBEC SR tablet Take 1 tablet (64 mg total) by  mouth daily. 05/23/14  Yes Michael Boston, MD  ondansetron (ZOFRAN) 8 MG tablet Take 1 tablet (8 mg total) by mouth every 8 (eight) hours as needed for nausea or vomiting. 06/03/15  Yes Maryanna Shape, NP  PEDIALYTE (PEDIALYTE) SOLN Take 240 mLs by mouth 3 (three) times daily as needed (poor appetite).   Yes Historical Provider, MD  polyethylene glycol (MIRALAX / GLYCOLAX) packet Take 17 g by mouth every 12 (twelve) hours as needed for mild constipation, moderate constipation or severe constipation. 05/23/14  Yes Michael Boston, MD  Probiotic Product (RISA-BID PROBIOTIC PO) Take 1 tablet by mouth 2 (two) times daily. 11/22/14  Yes Historical Provider, MD  prochlorperazine (COMPAZINE) 10 MG tablet Take 1 tablet (10 mg total) by mouth every 6 (six) hours as needed for nausea or vomiting. 06/03/15  Yes Maryanna Shape, NP  rivaroxaban (XARELTO) 20 MG TABS tablet Take 1 tablet (20 mg total) by mouth daily with supper. 12/21/13  Yes Maryann Mikhail, DO  vancomycin (VANCOCIN) 125 MG capsule Take 125 mg by mouth every 6  (six) hours.   Yes Historical Provider, MD  vitamin B-12 (CYANOCOBALAMIN) 1000 MCG tablet Place 2,000 mcg under the tongue daily.   Yes Historical Provider, MD  feeding supplement, RESOURCE BREEZE, (RESOURCE BREEZE) LIQD Take 1 Container by mouth 2 (two) times daily between meals. Patient not taking: Reported on 07/31/2015 01/25/14   Delfina Redwood, MD    Physical Exam: Filed Vitals:   07/31/15 1254 07/31/15 1456 07/31/15 1506 07/31/15 1551  BP: 147/77 119/65  165/71  Pulse: 92 81  82  Temp: 98 F (36.7 C)  97.6 F (36.4 C) 97.9 F (36.6 C)  TempSrc: Oral  Oral Axillary  Resp: 18 20  20   SpO2: 100% 96%  100%   General: Not in acute distress HEENT:       Eyes: PERRL, EOMI, no scleral icterus.       ENT: No discharge from the ears and nose, no pharynx injection, no tonsillar enlargement.        Neck: No JVD, no bruit, no mass felt. Heme: No neck lymph node enlargement. Cardiac: S1/S2, RRR, No murmurs, No gallops or rubs. Pulm: No rales, wheezing, rhonchi or rubs. Abd: Soft, nondistended, has mild tenderness diffusely, no rebound pain, no organomegaly, BS present. Has colostomy bag over left abdomen. GU: No hematuria Ext: No pitting leg edema bilaterally. 2+DP/PT pulse bilaterally. Musculoskeletal: No joint deformities, No joint redness or warmth, no limitation of ROM in spin. Skin: No rashes.  Neuro: Alert, oriented X3, cranial nerves II-XII grossly intact, moves all extremities normally. Psych: Patient is not psychotic, no suicidal or hemocidal ideation.  Labs on Admission: I have personally reviewed following labs and imaging studies  CBC:  Recent Labs Lab 07/31/15 1328  WBC 4.6  NEUTROABS 3.7  HGB 12.1  HCT 33.9*  MCV 102.4*  PLT [id number]   Basic Metabolic Panel:  Recent Labs Lab 07/31/15 1328  NA 138  K 3.0*  CL 108  CO2 25  GLUCOSE 114*  BUN 25*  CREATININE 0.53  CALCIUM 9.3  MG 1.5*   GFR: CrCl cannot be calculated (Unknown ideal weight.). Liver  Function Tests: No results for input(s): AST, ALT, ALKPHOS, BILITOT, PROT, ALBUMIN in the last 168 hours. No results for input(s): LIPASE, AMYLASE in the last 168 hours. No results for input(s): AMMONIA in the last 168 hours. Coagulation Profile:  Recent Labs Lab 07/31/15 1614  INR 1.90*   Cardiac Enzymes: No  results for input(s): CKTOTAL, CKMB, CKMBINDEX, TROPONINI in the last 168 hours. BNP (last 3 results) No results for input(s): PROBNP in the last 8760 hours. HbA1C: No results for input(s): HGBA1C in the last 72 hours. CBG: No results for input(s): GLUCAP in the last 168 hours. Lipid Profile: No results for input(s): CHOL, HDL, LDLCALC, TRIG, CHOLHDL, LDLDIRECT in the last 72 hours. Thyroid Function Tests: No results for input(s): TSH, T4TOTAL, FREET4, T3FREE, THYROIDAB in the last 72 hours. Anemia Panel: No results for input(s): VITAMINB12, FOLATE, FERRITIN, TIBC, IRON, RETICCTPCT in the last 72 hours. Urine analysis:    Component Value Date/Time   COLORURINE YELLOW 07/31/2015 [place] 07/31/2015 1456   LABSPEC 1.020 07/31/2015 1456   PHURINE 5.5 07/31/2015 1456   GLUCOSEU NEGATIVE 07/31/2015 1456   HGBUR SMALL* 07/31/2015 1456   BILIRUBINUR SMALL* 07/31/2015 1456   KETONESUR NEGATIVE 07/31/2015 1456   PROTEINUR NEGATIVE 07/31/2015 1456   UROBILINOGEN 0.2 01/22/2014 1930   NITRITE NEGATIVE 07/31/2015 1456   LEUKOCYTESUR SMALL* 07/31/2015 1456   Sepsis Labs: @LABRCNTIP (procalcitonin:4,lacticidven:4) )No results found for this or any previous visit (from the past 240 hour(s)).   Radiological Exams on Admission: No results found.   EKG: Not done in ED, will get one.   Assessment/Plan Principal Problem:   C. difficile colitis Active Problems:   Pulmonary embolism (HCC)   Gastroesophageal reflux disease   Hypertension   Ovarian mass, right, s/p robotic excision 05/17/2014   Rectal cancer s/p LAResectin/colostomy 05/17/2014   Hypothyroidism    Protein-calorie malnutrition, moderate (HCC)   Anxiety and depression   Lumbar radicular pain   Rectal cancer metastasized to liver (HCC)   C. difficile colitis: Patient has recurrent C. difficile colitis. Patient is not septic on admission. Hemodynamically stable.  -will place on Med-surg bed for obs -IV flagyl and oral Vanco -When necessary Zofran for nausea -IVF: 1L NS, then 100 cc/h -if no improvement, will need to call GI.  Hx of PE: no CP or SOB -continue Xarelto  Hx of rectal cancer: s/p LAResectin/colostomy 05/17/2014, s/p of XRT. Rectal cancer metastasized to liver. Pt is followed up by Dr. Alen Blew, currently on Xeloda -f/u with Dr. Burnard Bunting  GERD: -pepcide  Hypertension: bp is 147/77. Not taking meds at home -Monitor bp closely  Protein-calorie malnutrition, moderate ([place]): -Continue nutrition boost  Hypothyroidism: Last TSH was 1.8 on 01/24/15 -Continue home Synthroid  Depression and anxiety: Stable, no suicidal or homicidal ideations. -Continue home medications: Neurontin  DVT ppx: on Xarelto Code Status: DNR Family Communication: Yes, patient's nephew at bed side Disposition Plan:  Anticipate discharge back to previous home environment Consults called:  none Admission status: medical floor/obs   Date of Service 07/31/2015    Ivor Costa [facility] Pager [phone]  If 7PM-7AM, please contact night-coverage [email or web address] Password [facility] 07/31/2015, 6:48 PM

## 2015-07-31 NOTE — ED Notes (Signed)
She remains in no distress.  Her son remains with her.  I have just called report to Shanon Brow, RN and will tx to 5th floor shortly.

## 2015-08-01 ENCOUNTER — Other Ambulatory Visit: Payer: Medicare Other

## 2015-08-01 ENCOUNTER — Ambulatory Visit: Payer: Medicare Other | Admitting: Oncology

## 2015-08-01 DIAGNOSIS — R197 Diarrhea, unspecified: Secondary | ICD-10-CM | POA: Diagnosis not present

## 2015-08-01 DIAGNOSIS — A047 Enterocolitis due to Clostridium difficile: Secondary | ICD-10-CM | POA: Diagnosis not present

## 2015-08-01 LAB — BASIC METABOLIC PANEL
ANION GAP: 2 — AB (ref 5–15)
BUN: 16 mg/dL (ref 6–20)
CALCIUM: 8.1 mg/dL — AB (ref 8.9–10.3)
CHLORIDE: 116 mmol/L — AB (ref 101–111)
CO2: 22 mmol/L (ref 22–32)
CREATININE: 0.44 mg/dL (ref 0.44–1.00)
GFR calc non Af Amer: >60 mL/min (ref >60)
GLUCOSE: 99 mg/dL (ref 65–99)
Potassium: 2.8 mmol/L — ABNORMAL LOW (ref 3.5–5.1)
Sodium: 140 mmol/L (ref 135–145)

## 2015-08-01 LAB — CBC
HCT: 30 % — ABNORMAL LOW (ref 36.0–46.0)
HEMOGLOBIN: 10.5 g/dL — AB (ref 12.0–15.0)
MCH: 36.8 pg — AB (ref 26.0–34.0)
MCHC: 35 g/dL (ref 30.0–36.0)
MCV: 105.3 fL — AB (ref 78.0–100.0)
Platelets: 150 10*3/uL (ref 150–400)
RBC: 2.85 MIL/uL — AB (ref 3.87–5.11)
RDW: 16.5 % — ABNORMAL HIGH (ref 11.5–15.5)
WBC: 4.1 10*3/uL (ref 4.0–10.5)

## 2015-08-01 LAB — GASTROINTESTINAL PANEL BY PCR, STOOL (REPLACES STOOL CULTURE)

## 2015-08-01 LAB — MAGNESIUM: Magnesium: 1.7 mg/dL (ref 1.7–2.4)

## 2015-08-01 MED ORDER — POTASSIUM CHLORIDE 10 MEQ/100ML IV SOLN
10.0000 meq | INTRAVENOUS | Status: AC
  Administered 2015-08-01 (×5): 10 meq via INTRAVENOUS
  Filled 2015-08-01 (×5): qty 100

## 2015-08-01 NOTE — Consult Note (Signed)
Reason for Consult: C. Diff diarrhea and dehydration Referring Physician: [facility]  Bethany [name] HPI: This is an 82 year old female with a PMH of a rectal adenocarcinoma (T3N1) s/p chemo/XRT 9-11/2013, s/p LAR 05/2014, fibrothecoma, PE, and HTN admitted for persistent diarrhea secondary to C. Diff.  In the past she had a C. Diff infection while she was being treated for her rectal cancer.  Her diarrhea started earlier in May and her initial stool study was negative for C. Diff, but she was treated emperically with metronidazole x 14 days with an excellent response.  One week after stopping the antibiotics she had a recurrence of her diarrhea and the stool study during this second episode was positive for C. Diff.  She did not respond to the second round of treatment with metronidazole and she was subsequently started on vancomycin 125 mg QID, unfortunately, after 5-6 days of treatment she did not have a drop in her bowel movements.  Her blood work in the office on 05/29/2015 was negative for an elevated WBC >15,000, renal insufficiency, hypoalbuminemia, or hemoconcentration; however, she continued to have a copious amount of diarrhea into her ostomy bag.  In fact over the next 24-48 hours it was quantified to be >2000 ml/day.  At that point I recommended that she be admitted to the hospital.  Past Medical History  Diagnosis Date  . Migraine   . GERD (gastroesophageal reflux disease)   . Hypercholesteremia   . Pulmonary embolism ([place]) 01/01/2012  / 11/28/2013  . Hypothyroidism   . Rectal mass   . Ovarian tumor     sees dr Lisbeth Renshaw ob-gyn yearly for evaluation  . Cancer ([place]) 09/29/13    rectal adenocarcinoma  . Rectal cancer ([place]) 09/29/13    invasive adeocarcinoma  . Hx of radiation therapy 10/25/13-11/28/13    pelvis, 4500 cGy in 25 sessions  . Incontinence of urine   . Hypertension     stopped med during chemo /radiation - BP has been stable  . Numbness     "I have no feeling in my left  leg" unknown cause  . Arthritis   . History of skin cancer     removed from forehead  . Rosacea   . Rectal cancer ([place])   . Frequent bowel movements   . Hx: UTI (urinary tract infection)   . Depression   . Anxiety   . Severe sepsis with septic shock ([place]) 11/28/2013  . UTI (lower urinary tract infection) 01/23/2014  . Acute encephalopathy 12/08/2013  . Clostridium difficile colitis 12/08/2013  . Cor pulmonale, acute ([place]) 01/02/2012  . Fall from standing 01/22/2014  . Hypokalemia 11/28/2013  . Dysphonia, spasmodic 05/18/2014    Past Surgical History  Procedure Laterality Date  . Ovarian cyst surgery  yrs ago  . Cataract extraction Bilateral   . Colonoscopy N/A 09/22/2013    Procedure: COLONOSCOPY;  Surgeon: Beryle Beams, MD;  Location: [facility];  Service: Endoscopy;  Laterality: N/A;  . Eus N/A 09/29/2013    Procedure: LOWER ENDOSCOPIC ULTRASOUND (EUS);  Surgeon: Beryle Beams, MD;  Location: [facility];  Service: Endoscopy;  Laterality: N/A;  . Abdominal hysterectomy      years ago, prolapsed uterus    Family History  Problem Relation Age of Onset  . Brain cancer Mother   . Heart attack Father   . Diabetes Sister   . Heart disease Sister     Social History:  reports that she has never smoked. She  has never used smokeless tobacco. She reports that she does not drink alcohol or use illicit drugs.  Allergies:  Allergies  Allergen Reactions  . Cataflam [Diclofenac] Other (See Comments)    Unknown; patient is unaware of allergy.  . Cephalosporins Shortness Of Breath  . Erythromycin Shortness Of Breath  . Keflex [Cephalexin] Shortness Of Breath  . Propoxyphene     Unknown rxn  . Codeine Nausea And Vomiting  . Naproxen Other (See Comments)    Unknown; patient is unaware of allergy  . Penicillins Other (See Comments)    Patient states that she CAN take this; unaware of allergy, pt/family is unable to answer penicillin related questions as they do not recall this  allergy reaction at all  . Synalgos-Dc [Aspirin-Caff-Dihydrocodeine] Other (See Comments)    Unknown; patient is unaware of allergy  . Ultram [Tramadol] Other (See Comments)    Unknown; patient is unaware of allergy    Medications:  Scheduled: . cholecalciferol  2,000 Units Oral Daily  . cholestyramine  4 g Oral Q24H  . famotidine  20 mg Oral Daily  . folic acid  1 mg Oral Daily  . gabapentin  100 mg Oral TID  . [START ON 08/02/2015] levothyroxine  25 mcg Oral Once per day on Sun Mon Wed Fri  . levothyroxine  75 mcg Oral Once per day on Tue Thu Sat  . magnesium chloride  1 tablet Oral Daily  . metronidazole  500 mg Intravenous Q8H  . potassium chloride  10 mEq Intravenous Q1 Hr x 5  . rivaroxaban  20 mg Oral Q supper  . vancomycin  500 mg Oral Q6H  . vitamin B-12  2,000 mcg Oral Daily   Continuous: . sodium chloride 100 mL/hr at 07/31/15 1704    Results for orders placed or performed during the hospital encounter of 07/31/15 (from the past 24 hour(s))  CBC with Differential     Status: Abnormal   Collection Time: 07/31/15  1:28 PM  Result Value Ref Range   WBC 4.6 4.0 - 10.5 K/uL   RBC 3.31 (L) 3.87 - 5.11 MIL/uL   Hemoglobin 12.1 12.0 - 15.0 g/dL   HCT 33.9 (L) 36.0 - 46.0 %   MCV 102.4 (H) 78.0 - 100.0 fL   MCH 36.6 (H) 26.0 - 34.0 pg   MCHC 35.7 30.0 - 36.0 g/dL   RDW 16.4 (H) 11.5 - 15.5 %   Platelets 168 150 - 400 K/uL   Neutrophils Relative % 80 %   Neutro Abs 3.7 1.7 - 7.7 K/uL   Lymphocytes Relative 7 %   Lymphs Abs 0.3 (L) 0.7 - 4.0 K/uL   Monocytes Relative 12 %   Monocytes Absolute 0.5 0.1 - 1.0 K/uL   Eosinophils Relative 1 %   Eosinophils Absolute 0.0 0.0 - 0.7 K/uL   Basophils Relative 0 %   Basophils Absolute 0.0 0.0 - 0.1 K/uL  Basic metabolic panel     Status: Abnormal   Collection Time: 07/31/15  1:28 PM  Result Value Ref Range   Sodium 138 135 - 145 mmol/L   Potassium 3.0 (L) 3.5 - 5.1 mmol/L   Chloride 108 101 - 111 mmol/L   CO2 25 22 - 32  mmol/L   Glucose, Bld 114 (H) 65 - 99 mg/dL   BUN 25 (H) 6 - 20 mg/dL   Creatinine, Ser 0.53 0.44 - 1.00 mg/dL   Calcium 9.3 8.9 - 10.3 mg/dL   GFR calc non Af Amer >  60 >60 mL/min   GFR calc Af Amer >60 >60 mL/min   Anion gap 5 5 - 15  Magnesium     Status: Abnormal   Collection Time: 07/31/15  1:28 PM  Result Value Ref Range   Magnesium 1.5 (L) 1.7 - 2.4 mg/dL  Urinalysis, Routine w reflex microscopic (not at [facility])     Status: Abnormal   Collection Time: 07/31/15  2:56 PM  Result Value Ref Range   Color, Urine YELLOW YELLOW   APPearance CLEAR CLEAR   Specific Gravity, Urine 1.020 1.005 - 1.030   pH 5.5 5.0 - 8.0   Glucose, UA NEGATIVE NEGATIVE mg/dL   Hgb urine dipstick SMALL (A) NEGATIVE   Bilirubin Urine SMALL (A) NEGATIVE   Ketones, ur NEGATIVE NEGATIVE mg/dL   Protein, ur NEGATIVE NEGATIVE mg/dL   Nitrite NEGATIVE NEGATIVE   Leukocytes, UA SMALL (A) NEGATIVE  Urine microscopic-add on     Status: Abnormal   Collection Time: 07/31/15  2:56 PM  Result Value Ref Range   Squamous Epithelial / LPF 0-5 (A) NONE SEEN   WBC, UA 6-30 0 - 5 WBC/hpf   RBC / HPF 6-30 0 - 5 RBC/hpf   Bacteria, UA FEW (A) NONE SEEN   Casts HYALINE CASTS (A) NEGATIVE   Crystals CA OXALATE CRYSTALS (A) NEGATIVE   Urine-Other MUCOUS PRESENT   Lactic acid, plasma     Status: None   Collection Time: 07/31/15  4:14 PM  Result Value Ref Range   Lactic Acid, Venous 1.7 0.5 - 2.0 mmol/L  Procalcitonin     Status: None   Collection Time: 07/31/15  4:14 PM  Result Value Ref Range   Procalcitonin 0.13 ng/mL  APTT     Status: None   Collection Time: 07/31/15  4:14 PM  Result Value Ref Range   aPTT 30 24 - 37 seconds  Protime-INR     Status: Abnormal   Collection Time: 07/31/15  4:14 PM  Result Value Ref Range   Prothrombin Time 21.7 (H) 11.6 - 15.2 seconds   INR 1.90 (H) 0.00 - 1.49  C difficile quick scan w PCR reflex     Status: None   Collection Time: 07/31/15 10:30 PM  Result Value Ref Range   C  Diff antigen NEGATIVE NEGATIVE   C Diff toxin NEGATIVE NEGATIVE   C Diff interpretation Negative for toxigenic C. difficile   Magnesium     Status: None   Collection Time: 08/01/15  4:41 AM  Result Value Ref Range   Magnesium 1.7 1.7 - 2.4 mg/dL  Basic metabolic panel     Status: Abnormal   Collection Time: 08/01/15  4:43 AM  Result Value Ref Range   Sodium 140 135 - 145 mmol/L   Potassium 2.8 (L) 3.5 - 5.1 mmol/L   Chloride 116 (H) 101 - 111 mmol/L   CO2 22 22 - 32 mmol/L   Glucose, Bld 99 65 - 99 mg/dL   BUN 16 6 - 20 mg/dL   Creatinine, Ser 0.44 0.44 - 1.00 mg/dL   Calcium 8.1 (L) 8.9 - 10.3 mg/dL   GFR calc non Af Amer >60 >60 mL/min   GFR calc Af Amer >60 >60 mL/min   Anion gap 2 (L) 5 - 15  CBC     Status: Abnormal   Collection Time: 08/01/15  4:43 AM  Result Value Ref Range   WBC 4.1 4.0 - 10.5 K/uL   RBC 2.85 (L) 3.87 - 5.11 MIL/uL  Hemoglobin 10.5 (L) 12.0 - 15.0 g/dL   HCT 30.0 (L) 36.0 - 46.0 %   MCV 105.3 (H) 78.0 - 100.0 fL   MCH 36.8 (H) 26.0 - 34.0 pg   MCHC 35.0 30.0 - 36.0 g/dL   RDW 16.5 (H) 11.5 - 15.5 %   Platelets 150 150 - 400 K/uL     No results found.  ROS:  As stated above in the HPI otherwise negative.  Blood pressure 117/68, pulse 67, temperature 97.8 F (36.6 C), temperature source Oral, resp. rate 19, SpO2 99 %.    PE: Gen: NAD, Alert and Oriented HEENT:  [place]/AT, EOMI Neck: Supple, no LAD Lungs: CTA Bilaterally CV: RRR without M/G/R ABM: Soft, NTND, +BS Ext: No C/C/E  Assessment/Plan: 1) C. Diff diarrhea. 2) Rectal adenocarcinoma s/p treatment. 3) Metastatic disease.   The patient has improved with IV hydration, but she continues with diarrhea.  She was started on metronidazole IV and maintained on vancomycin.  I suspect that an ID consult will be necessary.  Another source of diarrhea could be from a radiation enteritis itself or from an enteric-colonic fistula.    Plan: 1) Continue with metronidazole and vancomycin. 2) If no  significant improvement by tomorrow and ID consult is required. 3) Continue IV hydration. 4) Stop oral magnesium as it causes diarrhea.  IV magnesium is fine.    Bethany [name] D 08/01/2015, 8:21 AM

## 2015-08-01 NOTE — Progress Notes (Signed)
   08/01/15 0929  PT Time Calculation  PT Start Time (ACUTE ONLY) 0904  PT Stop Time (ACUTE ONLY) 0917  PT Time Calculation (min) (ACUTE ONLY) 13 min  PT G-Codes **NOT FOR INPATIENT CLASS**  Functional Assessment Tool Used clinical judgement  Functional Limitation Mobility: Walking and moving around  Mobility: Walking and Moving Around Current Status [id number]) CI  Mobility: Walking and Moving Around Goal Status [id number]) CI  PT General Charges  $$ ACUTE PT VISIT 1 Procedure  PT Evaluation  $PT Eval Low Complexity 1 Procedure   Weston Anna, MPT [phone]

## 2015-08-01 NOTE — Progress Notes (Signed)
PROGRESS NOTE    Bethany [name]  [id number] DOB: [birth year] DOA: 07/31/2015 PCP: [name], MD   Brief Narrative:  82 year old female with a PMH of a rectal adenocarcinoma (T3N1) s/p chemo/XRT 9-11/2013, s/p LAR 05/2014, fibrothecoma, PE, and HTN admitted for persistent diarrhea secondary to C. Diff. In the past she had a C. Diff infection while she was being treated for her rectal cancer. Her diarrhea started earlier in May and her initial stool study was negative for C. Diff, but she was treated emperically with metronidazole x 14 days with an excellent response. One week after stopping the antibiotics she had a recurrence of her diarrhea and the stool study during this second episode was positive for C. Diff. She did not respond to the second round of treatment with metronidazole and she was subsequently started on vancomycin 125 mg QID, unfortunately, after 5-6 days of treatment she did not have a drop in her bowel movements   Assessment & Plan:   Principal Problem:   C. difficile colitis - GI on board have read their note and recommendations. For now we'll continue metronidazole and vancomycin. Continue supportive therapy  Active Problems:   Pulmonary embolism ([place]) - Plan is to continue anticoagulants xarelto    Gastroesophageal reflux disease - In lieu of principal problem Pepcid contraindicated as such will discontinue.    Hypertension - Given age but pressure stable at this moment. We'll continue monitoring  Hypokalemia - Patient had IV replacement. Magnesium level within normal limits on last check we'll reassess potassium levels next a.m.    Ovarian mass, right, s/p robotic excision 05/17/2014   Rectal cancer s/p LAResectin/colostomy 05/17/2014   Hypothyroidism - stable continue synthroid    Protein-calorie malnutrition, moderate (HCC)    Anxiety and depression   Lumbar radicular pain   Rectal cancer metastasized to liver [facility])   DVT prophylaxis: on xarelto Code  Status: DNR Family Communication: d/c patient directly Disposition Plan: continued antibiotics   Consultants:   GI   Procedures: none   Antimicrobials: Flagyl and Vancomycin   Subjective: Pt has no new complaints. No acute issues overnight.  Objective: Filed Vitals:   07/31/15 1551 07/31/15 2217 08/01/15 0645 08/01/15 1442  BP: 165/71 125/57 117/68 146/61  Pulse: 82 67 67 66  Temp: 97.9 F (36.6 C) 98 F (36.7 C) 97.8 F (36.6 C) 97.8 F (36.6 C)  TempSrc: Axillary Oral Oral Oral  Resp: 20 19 19 18   SpO2: 100% 99% 99% 99%    Intake/Output Summary (Last 24 hours) at 08/01/15 1720 Last data filed at 08/01/15 1315  Gross per 24 hour  Intake 1613.33 ml  Output   1575 ml  Net  38.33 ml   There were no vitals filed for this visit.  Examination:  General exam: Appears calm and comfortable  Respiratory system: Clear to auscultation. Respiratory effort normal. Cardiovascular system: S1 & S2 heard, RRR. No JVD, murmurs, rubs, gallops or clicks. No pedal edema. Gastrointestinal system: Abdomen is nondistended, soft and nontender. No organomegaly or masses felt. Normal bowel sounds heard.  Central nervous system: Alert and oriented. No focal neurological deficits. Extremities: Symmetric 5 x 5 power. Skin: No rashes, lesions or ulcers on limited exam. Psychiatry: Mood & affect appropriate.     Data Reviewed: I have personally reviewed following labs and imaging studies  CBC:  Recent Labs Lab 07/31/15 1328 08/01/15 0443  WBC 4.6 4.1  NEUTROABS 3.7  --   HGB 12.1 10.5*  HCT 33.9*  30.0*  MCV 102.4* 105.3*  PLT 168 [id number]   Basic Metabolic Panel:  Recent Labs Lab 07/31/15 1328 08/01/15 0441 08/01/15 0443  NA 138  --  140  K 3.0*  --  2.8*  CL 108  --  116*  CO2 25  --  22  GLUCOSE 114*  --  99  BUN 25*  --  16  CREATININE 0.53  --  0.44  CALCIUM 9.3  --  8.1*  MG 1.5* 1.7  --    GFR: CrCl cannot be calculated (Unknown ideal weight.). Liver  Function Tests: No results for input(s): AST, ALT, ALKPHOS, BILITOT, PROT, ALBUMIN in the last 168 hours. No results for input(s): LIPASE, AMYLASE in the last 168 hours. No results for input(s): AMMONIA in the last 168 hours. Coagulation Profile:  Recent Labs Lab 07/31/15 1614  INR 1.90*   Cardiac Enzymes: No results for input(s): CKTOTAL, CKMB, CKMBINDEX, TROPONINI in the last 168 hours. BNP (last 3 results) No results for input(s): PROBNP in the last 8760 hours. HbA1C: No results for input(s): HGBA1C in the last 72 hours. CBG: No results for input(s): GLUCAP in the last 168 hours. Lipid Profile: No results for input(s): CHOL, HDL, LDLCALC, TRIG, CHOLHDL, LDLDIRECT in the last 72 hours. Thyroid Function Tests: No results for input(s): TSH, T4TOTAL, FREET4, T3FREE, THYROIDAB in the last 72 hours. Anemia Panel: No results for input(s): VITAMINB12, FOLATE, FERRITIN, TIBC, IRON, RETICCTPCT in the last 72 hours. Sepsis Labs:  Recent Labs Lab 07/31/15 1614  PROCALCITON 0.13  LATICACIDVEN 1.7    Recent Results (from the past 240 hour(s))  Culture, blood (x 2)     Status: None (Preliminary result)   Collection Time: 07/31/15  4:10 PM  Result Value Ref Range Status   Specimen Description BLOOD RIGHT WRIST  Final   Special Requests BOTTLES DRAWN AEROBIC ONLY 5 CC  Final   Culture   Final    NO GROWTH < 24 HOURS Performed at [facility]    Report Status PENDING  Incomplete  Culture, blood (x 2)     Status: None (Preliminary result)   Collection Time: 07/31/15  4:15 PM  Result Value Ref Range Status   Specimen Description BLOOD RIGHT HAND  Final   Special Requests BOTTLES DRAWN AEROBIC AND ANAEROBIC 5 CC  Final   Culture   Final    NO GROWTH < 24 HOURS Performed at [facility]    Report Status PENDING  Incomplete  Gastrointestinal Panel by PCR , Stool     Status: None   Collection Time: 07/31/15 10:30 PM  Result Value Ref Range Status   Campylobacter  species NOT DETECTED NOT DETECTED Final   Plesimonas shigelloides NOT DETECTED NOT DETECTED Final   Salmonella species NOT DETECTED NOT DETECTED Final   Yersinia enterocolitica NOT DETECTED NOT DETECTED Final   Vibrio species NOT DETECTED NOT DETECTED Final   Vibrio cholerae NOT DETECTED NOT DETECTED Final   Enteroaggregative E coli (EAEC) NOT DETECTED NOT DETECTED Final   Enteropathogenic E coli (EPEC) NOT DETECTED NOT DETECTED Final   Enterotoxigenic E coli (ETEC) NOT DETECTED NOT DETECTED Final   Shiga like toxin producing E coli (STEC) NOT DETECTED NOT DETECTED Final   E. coli O157 NOT DETECTED NOT DETECTED Final   Shigella/Enteroinvasive E coli (EIEC) NOT DETECTED NOT DETECTED Final   Cryptosporidium NOT DETECTED NOT DETECTED Final   Cyclospora cayetanensis NOT DETECTED NOT DETECTED Final   Entamoeba histolytica NOT DETECTED NOT  DETECTED Final   Giardia lamblia NOT DETECTED NOT DETECTED Final   Adenovirus F40/41 NOT DETECTED NOT DETECTED Final   Astrovirus NOT DETECTED NOT DETECTED Final   Norovirus GI/GII NOT DETECTED NOT DETECTED Final   Rotavirus A NOT DETECTED NOT DETECTED Final   Sapovirus (I, II, IV, and V) NOT DETECTED NOT DETECTED Final  C difficile quick scan w PCR reflex     Status: None   Collection Time: 07/31/15 10:30 PM  Result Value Ref Range Status   C Diff antigen NEGATIVE NEGATIVE Final   C Diff toxin NEGATIVE NEGATIVE Final   C Diff interpretation Negative for toxigenic C. difficile  Final         Radiology Studies: No results found.      Scheduled Meds: . cholecalciferol  2,000 Units Oral Daily  . cholestyramine  4 g Oral Q24H  . famotidine  20 mg Oral Daily  . folic acid  1 mg Oral Daily  . gabapentin  100 mg Oral TID  . [START ON 08/02/2015] levothyroxine  25 mcg Oral Once per day on Sun Mon Wed Fri  . levothyroxine  75 mcg Oral Once per day on Tue Thu Sat  . metronidazole  500 mg Intravenous Q8H  . rivaroxaban  20 mg Oral Q supper  .  vancomycin  500 mg Oral Q6H  . vitamin B-12  2,000 mcg Oral Daily   Continuous Infusions: . sodium chloride 100 mL/hr at 07/31/15 1704     LOS: 1 day    Time spent: > 35 minutes    Velvet Bathe, MD [facility] Pager [phone]  If 7PM-7AM, please contact night-coverage [email or web address] Password [email or web address] 08/01/2015, 5:20 PM

## 2015-08-01 NOTE — Progress Notes (Signed)
Occupational Therapy Evaluation Patient Details Name: Bethany [name] MRN: [id number] DOB: [birth year] Today's Date: 08/01/2015    History of Present Illness 82 yo female admitted with C-diff.    Clinical Impression   Patient presents to OT with decreased ADL independence due to the deficits listed below. She will benefit from skilled OT to maximize function and to facilitate a safe discharge. OT will follow.    Follow Up Recommendations  Home health OT;Supervision - Intermittent    Equipment Recommendations  None recommended by OT    Recommendations for Other Services       Precautions / Restrictions Precautions Precautions: Fall Restrictions Weight Bearing Restrictions: No      Mobility Bed Mobility Overal bed mobility: Modified Independent                Transfers Overall transfer level: Needs assistance Equipment used: Rolling walker (2 wheeled) Transfers: Sit to/from Stand Sit to Stand: Supervision         General transfer comment: for safety    Balance                                            ADL Overall ADL's : Needs assistance/impaired     Grooming: Wash/dry hands;Min guard;Standing           Upper Body Dressing : Minimal assistance;Sitting Upper Body Dressing Details (indicate cue type and reason): don gown as robe Lower Body Dressing: Minimal assistance;Sit to/from stand Lower Body Dressing Details (indicate cue type and reason): usually uses AE at home Toilet Transfer: Min guard;Ambulation;Comfort height toilet;RW   Toileting- Water quality scientist and Hygiene: Min guard;Sit to/from stand       Functional mobility during ADLs: Herbalist     Praxis      Pertinent Vitals/Pain Pain Assessment: No/denies pain     Hand Dominance     Extremity/Trunk Assessment Upper Extremity Assessment Upper Extremity Assessment: Generalized weakness   Lower Extremity  Assessment Lower Extremity Assessment: Defer to PT evaluation   Cervical / Trunk Assessment Cervical / Trunk Assessment: Kyphotic   Communication Communication Communication: No difficulties   Cognition Arousal/Alertness: Awake/alert Behavior During Therapy: WFL for tasks assessed/performed Overall Cognitive Status: Within Functional Limits for tasks assessed                     General Comments       Exercises       Shoulder Instructions      Home Living Family/patient expects to be discharged to:: Private residence Living Arrangements: Other relatives (nephew) Available Help at Discharge: Family Type of Home: House Home Access: Stairs to enter Technical brewer of Steps: 2   Home Layout: One level     Bathroom Shower/Tub: Occupational psychologist: Handicapped height Bathroom Accessibility: Yes How Accessible: Accessible via walker Home Equipment: [place] - 2 wheels;Bedside commode;Shower seat;Adaptive equipment Adaptive Equipment: Reacher;Sock aid;Long-handled shoe horn;Long-handled sponge        Prior Functioning/Environment Level of Independence: Independent with assistive device(s)             OT Diagnosis: Generalized weakness   OT Problem List: Decreased strength;Decreased activity tolerance;Decreased knowledge of use of DME or AE;Pain   OT Treatment/Interventions: Self-care/ADL training;DME and/or AE instruction;Therapeutic activities;Patient/family education    OT Goals(Current  goals can be found in the care plan section) Acute Rehab OT Goals Patient Stated Goal: home OT Goal Formulation: With patient Time For Goal Achievement: 08/15/15 Potential to Achieve Goals: Good  OT Frequency: Min 2X/week   Barriers to D/C:            Co-evaluation              End of Session Equipment Utilized During Treatment: Rolling walker Nurse Communication: Mobility status  Activity Tolerance: Patient tolerated treatment  well Patient left: in bed;with call bell/phone within reach;with family/visitor present   Time: [id number] OT Time Calculation (min): 15 min Charges:  OT General Charges $OT Visit: 1 Procedure OT Evaluation $OT Eval Low Complexity: 1 Procedure G-Codes: OT G-codes **NOT FOR INPATIENT CLASS** Functional Assessment Tool Used: clinical judgment Functional Limitation: Self care Self Care Current Status [id number]): At least 1 percent but less than 20 percent impaired, limited or restricted Self Care Goal Status [id number]): At least 1 percent but less than 20 percent impaired, limited or restricted  Bethany [name] A 08/01/2015, 12:37 PM

## 2015-08-01 NOTE — Evaluation (Signed)
Physical Therapy Evaluation Patient Details Name: Bethany [name] MRN: [id number] DOB: [birth year] Today's Date: 08/01/2015   History of Present Illness  82 yo female admitted with C-diff.   Clinical Impression  On eval, pt was Min guard assist for mobility-walked ~75 feet with RW. Recommend HHPT follow up. Will follow and progress activity as tolerated.     Follow Up Recommendations Home health PT;Supervision - Intermittent    Equipment Recommendations  None recommended by PT    Recommendations for Other Services       Precautions / Restrictions Precautions Precautions: Fall Restrictions Weight Bearing Restrictions: No      Mobility  Bed Mobility Overal bed mobility: Modified Independent                Transfers Overall transfer level: Needs assistance Equipment used: Rolling walker (2 wheeled) Transfers: Sit to/from Stand Sit to Stand: Supervision         General transfer comment: for safety  Ambulation/Gait Ambulation/Gait assistance: Min guard Ambulation Distance (Feet): 75 Feet Assistive device: Rolling walker (2 wheeled) Gait Pattern/deviations: Step-through pattern;Decreased stride length     General Gait Details: good gait speed. Pt fatigues fairly easily  Stairs            Wheelchair Mobility    Modified Rankin (Stroke Patients Only)       Balance                                             Pertinent Vitals/Pain Pain Assessment: No/denies pain    Home Living Family/patient expects to be discharged to:: Private residence Living Arrangements: Other relatives (nephew) Available Help at Discharge: Family Type of Home: House Home Access: Stairs to enter   Technical brewer of Steps: 2 Home Layout: One level Home Equipment: Environmental consultant - 2 wheels      Prior Function                 Hand Dominance        Extremity/Trunk Assessment   Upper Extremity Assessment: Defer to OT evaluation            Lower Extremity Assessment: Generalized weakness      Cervical / Trunk Assessment: Kyphotic  Communication      Cognition Arousal/Alertness: Awake/alert Behavior During Therapy: WFL for tasks assessed/performed Overall Cognitive Status: Within Functional Limits for tasks assessed                      General Comments      Exercises        Assessment/Plan    PT Assessment Patient needs continued PT services  PT Diagnosis Difficulty walking;Generalized weakness   PT Problem List Decreased strength;Decreased mobility;Decreased activity tolerance;Decreased balance  PT Treatment Interventions DME instruction;Gait training;Functional mobility training;Therapeutic activities;Patient/family education;Balance training;Therapeutic exercise   PT Goals (Current goals can be found in the Care Plan section) Acute Rehab PT Goals Patient Stated Goal: home PT Goal Formulation: With patient/family Time For Goal Achievement: 08/15/15 Potential to Achieve Goals: Good    Frequency Min 3X/week   Barriers to discharge        Co-evaluation               End of Session   Activity Tolerance: Patient tolerated treatment well Patient left: in bed;with call bell/phone within reach;with bed alarm set;with family/visitor present  Time: [id number] PT Time Calculation (min) (ACUTE ONLY): 13 min   Charges:   PT Evaluation $PT Eval Low Complexity: 1 Procedure     PT G Codes:        Weston Anna, MPT Pager: [phone]

## 2015-08-01 NOTE — Progress Notes (Signed)
Patients potassium level 2.8 with no symptoms. Dr. Wendee Beavers notified. Will continue to monitor and update as needed

## 2015-08-02 ENCOUNTER — Telehealth: Payer: Self-pay | Admitting: *Deleted

## 2015-08-02 ENCOUNTER — Encounter: Payer: Self-pay | Admitting: *Deleted

## 2015-08-02 DIAGNOSIS — A047 Enterocolitis due to Clostridium difficile: Secondary | ICD-10-CM | POA: Diagnosis not present

## 2015-08-02 LAB — BASIC METABOLIC PANEL
ANION GAP: 4 — AB (ref 5–15)
BUN: 9 mg/dL (ref 6–20)
CHLORIDE: 116 mmol/L — AB (ref 101–111)
CO2: 21 mmol/L — AB (ref 22–32)
Calcium: 8.1 mg/dL — ABNORMAL LOW (ref 8.9–10.3)
Creatinine, Ser: 0.4 mg/dL — ABNORMAL LOW (ref 0.44–1.00)
GFR calc non Af Amer: >60 mL/min (ref >60)
GLUCOSE: 94 mg/dL (ref 65–99)
Potassium: 2.9 mmol/L — ABNORMAL LOW (ref 3.5–5.1)
Sodium: 141 mmol/L (ref 135–145)

## 2015-08-02 LAB — CBC
HEMATOCRIT: 31.1 % — AB (ref 36.0–46.0)
HEMOGLOBIN: 10.8 g/dL — AB (ref 12.0–15.0)
MCH: 36.7 pg — AB (ref 26.0–34.0)
MCHC: 34.7 g/dL (ref 30.0–36.0)
MCV: 105.8 fL — AB (ref 78.0–100.0)
Platelets: 139 10*3/uL — ABNORMAL LOW (ref 150–400)
RBC: 2.94 MIL/uL — AB (ref 3.87–5.11)
RDW: 16.6 % — ABNORMAL HIGH (ref 11.5–15.5)
WBC: 4.4 10*3/uL (ref 4.0–10.5)

## 2015-08-02 NOTE — Care Management Obs Status (Signed)
MEDICARE OBSERVATION STATUS NOTIFICATION   Patient Details  Name: Bethany [name] MRN: [id number] Date of Birth: 05/15/1928   Medicare Observation Status Notification Given:  Yes    Leeroy Cha, RN 08/02/2015, 10:30 AM

## 2015-08-02 NOTE — Telephone Encounter (Signed)
Per dr Alen Blew, she is to discontinue taking Xeloda while inpt. Nephew wallace verbalized understanding.

## 2015-08-02 NOTE — Progress Notes (Signed)
Occupational Therapy Treatment Patient Details Name: DEZMARIAH BELO MRN: [id number] DOB: 1929-01-29 Today's Date: 08/02/2015    History of present illness 82 yo female admitted with C-diff.    OT comments  Patient progressing well towards LTGs. Continue OT per plan of care. Continue to recommend HHOT at discharge.  Follow Up Recommendations  Home health OT;Supervision - Intermittent    Equipment Recommendations  None recommended by OT    Recommendations for Other Services      Precautions / Restrictions Precautions Precautions: Fall Restrictions Weight Bearing Restrictions: No       Mobility Bed Mobility Overal bed mobility: Modified Independent                Transfers Overall transfer level: Needs assistance Equipment used: Rolling walker (2 wheeled) Transfers: Sit to/from Stand Sit to Stand: Supervision         General transfer comment: for safety    Balance                                   ADL Overall ADL's : Needs assistance/impaired     Grooming: Wash/dry hands;Supervision/safety;Standing           Upper Body Dressing : Set up;Sitting   Lower Body Dressing: Supervision/safety;Sit to/from stand   Toilet Transfer: Supervision/safety;Ambulation;Comfort height toilet;RW   Toileting- Clothing Manipulation and Hygiene: Supervision/safety;Sit to/from stand       Functional mobility during ADLs: Supervision/safety;Rolling walker General ADL Comments: Patient practiced ambulation in room with RW, then to bathroom for toileting/grooming/dressing.       Vision                     Perception     Praxis      Cognition   Behavior During Therapy: WFL for tasks assessed/performed Overall Cognitive Status: Within Functional Limits for tasks assessed                       Extremity/Trunk Assessment               Exercises     Shoulder Instructions       General Comments      Pertinent Vitals/  Pain       Pain Assessment: No/denies pain  Home Living                                          Prior Functioning/Environment              Frequency Min 2X/week     Progress Toward Goals  OT Goals(current goals can now be found in the care plan section)  Progress towards OT goals: Progressing toward goals     Plan Discharge plan remains appropriate    Co-evaluation                 End of Session Equipment Utilized During Treatment: Rolling walker   Activity Tolerance Patient tolerated treatment well   Patient Left in bed;with call bell/phone within reach;with bed alarm set   Nurse Communication Mobility status    Functional Assessment Tool Used: clinical judgment Functional Limitation: Self care Self Care Current Status [id number]): At least 1 percent but less than 20 percent impaired, limited or restricted Self Care Goal Status [id number]): At least 1 percent  but less than 20 percent impaired, limited or restricted   Time: 1221-1237 OT Time Calculation (min): 16 min  Charges: OT G-codes **NOT FOR INPATIENT CLASS** Functional Assessment Tool Used: clinical judgment Functional Limitation: Self care Self Care Current Status [id number]): At least 1 percent but less than 20 percent impaired, limited or restricted Self Care Goal Status [id number]): At least 1 percent but less than 20 percent impaired, limited or restricted OT General Charges $OT Visit: 1 Procedure OT Treatments $Self Care/Home Management : 8-22 mins  Lizzete Gough A 08/02/2015, 1:05 PM

## 2015-08-02 NOTE — Progress Notes (Signed)
Oncology is aware of patient condition and I have recommended stopping Xeloda while hospitalizd. Patient is aware.

## 2015-08-02 NOTE — Progress Notes (Signed)
PROGRESS NOTE    Bethany [name]  [id number] DOB: 08-16-1928 DOA: 07/31/2015 PCP: [name], MD   Brief Narrative:  82 year old female with a PMH of a rectal adenocarcinoma (T3N1) s/p chemo/XRT 9-11/2013, s/p LAR 05/2014, fibrothecoma, PE, and HTN admitted for persistent diarrhea secondary to C. Diff. In the past she had a C. Diff infection while she was being treated for her rectal cancer. Her diarrhea started earlier in May and her initial stool study was negative for C. Diff, but she was treated emperically with metronidazole x 14 days with an excellent response. One week after stopping the antibiotics she had a recurrence of her diarrhea and the stool study during this second episode was positive for C. Diff. She did not respond to the second round of treatment with metronidazole and she was subsequently started on vancomycin 125 mg QID, unfortunately, after 5-6 days of treatment she did not have a drop in her bowel movements   Assessment & Plan:   Principal Problem:   C. difficile colitis - GI on board have read their note and recommendations. For now we'll continue metronidazole and vancomycin. Continue supportive therapy. Once stools become firm will plan on discharging on 125 mg of vancomycin for a total of 6 weeks   Active Problems:   Pulmonary embolism ([place]) - Plan is to continue anticoagulants xarelto    Gastroesophageal reflux disease - In lieu of principal problem Pepcid contraindicated as such will discontinue.    Hypertension - Given age but pressure stable at this moment. We'll continue monitoring  Hypokalemia - Patient had IV replacement. Magnesium level within normal limits on last check we'll reassess potassium levels next a.m.    Ovarian mass, right, s/p robotic excision 05/17/2014   Rectal cancer s/p LAResectin/colostomy 05/17/2014   Hypothyroidism - stable continue synthroid    Protein-calorie malnutrition, moderate (HCC)    Anxiety and depression   Lumbar  radicular pain   Rectal cancer metastasized to liver [facility])   DVT prophylaxis: on xarelto Code Status: DNR Family Communication: d/c patient directly Disposition Plan: continued antibiotics   Consultants:   GI   Procedures: none   Antimicrobials: Flagyl and Vancomycin   Subjective: Pt has no new complaints. No acute issues overnight.  Objective: Filed Vitals:   08/01/15 1442 08/01/15 2109 08/02/15 0656 08/02/15 1503  BP: 146/61 138/68 122/68 145/68  Pulse: 66 63 78 73  Temp: 97.8 F (36.6 C) 98.2 F (36.8 C) 97.7 F (36.5 C) 97.9 F (36.6 C)  TempSrc: Oral Oral Oral Oral  Resp: 18 18 18 18   SpO2: 99% 100% 100% 100%    Intake/Output Summary (Last 24 hours) at 08/02/15 1538 Last data filed at 08/02/15 1504  Gross per 24 hour  Intake   2470 ml  Output   2950 ml  Net   -480 ml   There were no vitals filed for this visit.  Examination:  General exam: Appears calm and comfortable  Respiratory system: Clear to auscultation. Respiratory effort normal. Cardiovascular system: S1 & S2 heard, RRR. No JVD, murmurs, rubs, gallops or clicks. No pedal edema. Gastrointestinal system: Abdomen is nondistended, soft and nontender. No organomegaly or masses felt. Normal bowel sounds heard.  Central nervous system: Alert and oriented. No focal neurological deficits. Extremities: Symmetric 5 x 5 power. Skin: No rashes, lesions or ulcers on limited exam. Psychiatry: Mood & affect appropriate.     Data Reviewed: I have personally reviewed following labs and imaging studies  CBC:  Recent  Labs Lab 07/31/15 1328 08/01/15 0443 08/02/15 0513  WBC 4.6 4.1 4.4  NEUTROABS 3.7  --   --   HGB 12.1 10.5* 10.8*  HCT 33.9* 30.0* 31.1*  MCV 102.4* 105.3* 105.8*  PLT 168 150 [id number]*   Basic Metabolic Panel:  Recent Labs Lab 07/31/15 1328 08/01/15 0441 08/01/15 0443 08/02/15 0513  NA 138  --  140 141  K 3.0*  --  2.8* 2.9*  CL 108  --  116* 116*  CO2 25  --  22 21*   GLUCOSE 114*  --  99 94  BUN 25*  --  16 9  CREATININE 0.53  --  0.44 0.40*  CALCIUM 9.3  --  8.1* 8.1*  MG 1.5* 1.7  --   --    GFR: CrCl cannot be calculated (Unknown ideal weight.). Liver Function Tests: No results for input(s): AST, ALT, ALKPHOS, BILITOT, PROT, ALBUMIN in the last 168 hours. No results for input(s): LIPASE, AMYLASE in the last 168 hours. No results for input(s): AMMONIA in the last 168 hours. Coagulation Profile:  Recent Labs Lab 07/31/15 1614  INR 1.90*   Cardiac Enzymes: No results for input(s): CKTOTAL, CKMB, CKMBINDEX, TROPONINI in the last 168 hours. BNP (last 3 results) No results for input(s): PROBNP in the last 8760 hours. HbA1C: No results for input(s): HGBA1C in the last 72 hours. CBG: No results for input(s): GLUCAP in the last 168 hours. Lipid Profile: No results for input(s): CHOL, HDL, LDLCALC, TRIG, CHOLHDL, LDLDIRECT in the last 72 hours. Thyroid Function Tests: No results for input(s): TSH, T4TOTAL, FREET4, T3FREE, THYROIDAB in the last 72 hours. Anemia Panel: No results for input(s): VITAMINB12, FOLATE, FERRITIN, TIBC, IRON, RETICCTPCT in the last 72 hours. Sepsis Labs:  Recent Labs Lab 07/31/15 1614  PROCALCITON 0.13  LATICACIDVEN 1.7    Recent Results (from the past 240 hour(s))  Culture, blood (x 2)     Status: None (Preliminary result)   Collection Time: 07/31/15  4:10 PM  Result Value Ref Range Status   Specimen Description BLOOD RIGHT WRIST  Final   Special Requests BOTTLES DRAWN AEROBIC ONLY 5 CC  Final   Culture   Final    NO GROWTH 2 DAYS Performed at [facility]    Report Status PENDING  Incomplete  Culture, blood (x 2)     Status: None (Preliminary result)   Collection Time: 07/31/15  4:15 PM  Result Value Ref Range Status   Specimen Description BLOOD RIGHT HAND  Final   Special Requests BOTTLES DRAWN AEROBIC AND ANAEROBIC 5 CC  Final   Culture   Final    NO GROWTH 2 DAYS Performed at [facility]    Report Status PENDING  Incomplete  Gastrointestinal Panel by PCR , Stool     Status: None   Collection Time: 07/31/15 10:30 PM  Result Value Ref Range Status   Campylobacter species NOT DETECTED NOT DETECTED Final   Plesimonas shigelloides NOT DETECTED NOT DETECTED Final   Salmonella species NOT DETECTED NOT DETECTED Final   Yersinia enterocolitica NOT DETECTED NOT DETECTED Final   Vibrio species NOT DETECTED NOT DETECTED Final   Vibrio cholerae NOT DETECTED NOT DETECTED Final   Enteroaggregative E coli (EAEC) NOT DETECTED NOT DETECTED Final   Enteropathogenic E coli (EPEC) NOT DETECTED NOT DETECTED Final   Enterotoxigenic E coli (ETEC) NOT DETECTED NOT DETECTED Final   Shiga like toxin producing E coli (STEC) NOT DETECTED NOT DETECTED Final  E. coli O157 NOT DETECTED NOT DETECTED Final   Shigella/Enteroinvasive E coli (EIEC) NOT DETECTED NOT DETECTED Final   Cryptosporidium NOT DETECTED NOT DETECTED Final   Cyclospora cayetanensis NOT DETECTED NOT DETECTED Final   Entamoeba histolytica NOT DETECTED NOT DETECTED Final   Giardia lamblia NOT DETECTED NOT DETECTED Final   Adenovirus F40/41 NOT DETECTED NOT DETECTED Final   Astrovirus NOT DETECTED NOT DETECTED Final   Norovirus GI/GII NOT DETECTED NOT DETECTED Final   Rotavirus A NOT DETECTED NOT DETECTED Final   Sapovirus (I, II, IV, and V) NOT DETECTED NOT DETECTED Final  C difficile quick scan w PCR reflex     Status: None   Collection Time: 07/31/15 10:30 PM  Result Value Ref Range Status   C Diff antigen NEGATIVE NEGATIVE Final   C Diff toxin NEGATIVE NEGATIVE Final   C Diff interpretation Negative for toxigenic C. difficile  Final     Radiology Studies: No results found.   Scheduled Meds: . cholecalciferol  2,000 Units Oral Daily  . folic acid  1 mg Oral Daily  . gabapentin  100 mg Oral TID  . levothyroxine  25 mcg Oral Once per day on Sun Mon Wed Fri  . levothyroxine  75 mcg Oral Once per day on Tue Thu Sat   . metronidazole  500 mg Intravenous Q8H  . rivaroxaban  20 mg Oral Q supper  . vancomycin  500 mg Oral Q6H  . vitamin B-12  2,000 mcg Oral Daily   Continuous Infusions: . sodium chloride 100 mL/hr at 08/02/15 0631     LOS: 2 days    Time spent: > 35 minutes   Velvet Bathe, MD [facility] Pager [phone]  If 7PM-7AM, please contact night-coverage [email or web address] Password [facility] 08/02/2015, 3:38 PM

## 2015-08-02 NOTE — Progress Notes (Signed)
Subjective: She continues to feel better.  She only needed to empty her ostomy once this AM.  Objective: Vital signs in last 24 hours: Temp:  [97.7 F (36.5 C)-98.2 F (36.8 C)] 97.7 F (36.5 C) (06/23 0656) Pulse Rate:  [63-78] 78 (06/23 0656) Resp:  [18] 18 (06/23 0656) BP: (122-146)/(61-68) 122/68 mmHg (06/23 0656) SpO2:  [99 %-100 %] 100 % (06/23 0656) Last BM Date: 08/01/15  Intake/Output from previous day: 06/22 0701 - 06/23 0700 In: 2590 [P.O.:840; I.V.:1500; IV Piggyback:250] Out: [id number] [Urine:1550; [id number] Intake/Output this shift: Total I/O In: 240 [P.O.:240] Out: 150 [Urine:150]  General appearance: alert and no distress GI: soft, non-tender; bowel sounds normal; no masses,  no organomegaly  Lab Results:  Recent Labs  07/31/15 1328 08/01/15 0443 08/02/15 0513  WBC 4.6 4.1 4.4  HGB 12.1 10.5* 10.8*  HCT 33.9* 30.0* 31.1*  PLT 168 150 139*   BMET  Recent Labs  07/31/15 1328 08/01/15 0443 08/02/15 0513  NA 138 140 141  K 3.0* 2.8* 2.9*  CL 108 116* 116*  CO2 25 22 21*  GLUCOSE 114* 99 94  BUN 25* 16 9  CREATININE 0.53 0.44 0.40*  CALCIUM 9.3 8.1* 8.1*   LFT No results for input(s): PROT, ALBUMIN, AST, ALT, ALKPHOS, BILITOT, BILIDIR, IBILI in the last 72 hours. PT/INR  Recent Labs  07/31/15 1614  LABPROT 21.7*  INR 1.90*   Hepatitis Panel No results for input(s): HEPBSAG, HCVAB, HEPAIGM, HEPBIGM in the last 72 hours. C-Diff  Recent Labs  07/31/15 2230  CDIFFTOX NEGATIVE   Fecal Lactopherrin No results for input(s): FECLLACTOFRN in the last 72 hours.  Studies/Results: No results found.  Medications:  Scheduled: . cholecalciferol  2,000 Units Oral Daily  . folic acid  1 mg Oral Daily  . gabapentin  100 mg Oral TID  . levothyroxine  25 mcg Oral Once per day on Sun Mon Wed Fri  . levothyroxine  75 mcg Oral Once per day on Tue Thu Sat  . metronidazole  500 mg Intravenous Q8H  . rivaroxaban  20 mg Oral Q supper  . vancomycin   500 mg Oral Q6H  . vitamin B-12  2,000 mcg Oral Daily   Continuous: . sodium chloride 100 mL/hr at 08/02/15 0631    Assessment/Plan: 1) C. Diff colitis - improving. 2) Diarrhea - improving.  3) Metastatic rectal cancer.   She appears to be responding to duel treatment.  Even though there is no significant difference between 125 mg versus 500 mg of vancomycin, she can remain on the higher dosing as she is improving.  The goal is to have formed stools.  I am hoping this will take place this weekend.  Subsequently she can be treated with 125 mg of vancomycin for a total of 6 weeks for the pulse taper regimen.  Plan: 1) Continue with vanc and metronidazole. 2) I left a message for Oncology to see if she needs to be back on her Xeloda.  LOS: 2 days   Tryton Bodi D 08/02/2015, 1:22 PM

## 2015-08-03 DIAGNOSIS — A047 Enterocolitis due to Clostridium difficile: Principal | ICD-10-CM

## 2015-08-03 DIAGNOSIS — E876 Hypokalemia: Secondary | ICD-10-CM | POA: Diagnosis not present

## 2015-08-03 DIAGNOSIS — R197 Diarrhea, unspecified: Secondary | ICD-10-CM | POA: Diagnosis present

## 2015-08-03 DIAGNOSIS — A09 Infectious gastroenteritis and colitis, unspecified: Secondary | ICD-10-CM | POA: Diagnosis not present

## 2015-08-03 LAB — MAGNESIUM: MAGNESIUM: 1.1 mg/dL — AB (ref 1.7–2.4)

## 2015-08-03 MED ORDER — ADULT MULTIVITAMIN LIQUID CH
15.0000 mL | Freq: Every day | ORAL | Status: DC
  Administered 2015-08-03 – 2015-08-12 (×8): 15 mL via ORAL
  Filled 2015-08-03 (×10): qty 15

## 2015-08-03 MED ORDER — METRONIDAZOLE 500 MG PO TABS
500.0000 mg | ORAL_TABLET | Freq: Three times a day (TID) | ORAL | Status: DC
  Administered 2015-08-03 – 2015-08-05 (×7): 500 mg via ORAL
  Filled 2015-08-03 (×10): qty 1

## 2015-08-03 MED ORDER — POTASSIUM CHLORIDE CRYS ER 20 MEQ PO TBCR: 40.0000 meq | EXTENDED_RELEASE_TABLET | Freq: Once | ORAL | Status: DC

## 2015-08-03 MED ORDER — SACCHAROMYCES BOULARDII 250 MG PO CAPS
250.0000 mg | ORAL_CAPSULE | Freq: Two times a day (BID) | ORAL | Status: DC
Start: 2015-08-03 — End: 2015-08-12
  Administered 2015-08-03 – 2015-08-12 (×18): 250 mg via ORAL
  Filled 2015-08-03 (×19): qty 1

## 2015-08-03 MED ORDER — POTASSIUM CHLORIDE 10 MEQ/100ML IV SOLN
10.0000 meq | INTRAVENOUS | Status: DC
  Administered 2015-08-03: 10 meq via INTRAVENOUS
  Filled 2015-08-03 (×4): qty 100

## 2015-08-03 MED ORDER — DIPHENOXYLATE-ATROPINE 2.5-0.025 MG PO TABS
1.0000 | ORAL_TABLET | Freq: Four times a day (QID) | ORAL | Status: DC
  Administered 2015-08-03 – 2015-08-08 (×21): 1 via ORAL
  Filled 2015-08-03 (×21): qty 1

## 2015-08-03 MED ORDER — POTASSIUM CHLORIDE CRYS ER 20 MEQ PO TBCR
40.0000 meq | EXTENDED_RELEASE_TABLET | Freq: Once | ORAL | Status: AC
  Administered 2015-08-03: 40 meq via ORAL
  Filled 2015-08-03: qty 2

## 2015-08-03 NOTE — Progress Notes (Signed)
Pharmacy IV to PO conversion  This patient is receiving metronidazole by the intravenous route. Based on criteria approved by the Pharmacy and Therapeutics Committee, and the Infectious Disease Division, the antibiotic(s) is/are being converted to equivalent oral dose form(s). These criteria include:   Patient being treated for a respiratory tract infection, urinary tract infection, cellulitis, or Clostridium Difficile Associated Diarrhea  The patient is not neutropenic and does not exhibit a GI malabsorption state  The patient is eating (either orally or per tube) and/or has been taking other orally administered medications for at least 24 hours.  The patient is improving clinically (physician assessment and a 24-hour Tmax of <=100.5 F)  If you have any questions about this conversion, please contact the Pharmacy Department (ext [phone]).  Thank you.  Reuel Boom, PharmD Pager: [phone] 08/03/2015, 1:54 PM

## 2015-08-03 NOTE — Progress Notes (Signed)
PT Cancellation Note  Patient Details Name: Bethany [name] MRN: [id number] DOB: 11-Dec-1928   Cancelled Treatment:    Reason Eval/Treat Not Completed: Attempted PT tx session. Spoke with NT who reported pt requested she be allowed to rest on today. Will check back another day.    Weston Anna, MPT Pager: [phone]

## 2015-08-03 NOTE — Progress Notes (Signed)
Notified physician that patient unable to tolerate IV potassium.  Physician changing order to PO potassium.

## 2015-08-03 NOTE — Progress Notes (Signed)
Progress Note   Subjective  Bethany [name] is an 82 y/o Caucasian female with a history of rectal adenocarcinoma status post chemotherapy and radiation, PE and hypertension admitted for persistent diarrhea secondary to C. difficile.  This morning the patient is found standing up at the sink brushing her teeth, she is in some distress as she describes that after she saw Dr. Benson Norway yesterday morning everything "fell to pieces". She describes having a large amount of diarrhea and feeling "ill". She ate a very good lunch, but was unable to eat much or for dinner and it "didn't taste good". This morning she feels somewhat nauseous. She has just emptied her ostomy bag and is in distress that her symptoms have not decreased more by now. Her nephew is by her bedside and is very supportive.  Per nursing, there has been no large increase in output compared to the day before. Per chart review patient's total output from stool did increase compared to June 22 to the 23rd when it was 1525 and from June 23 of June 24 it was 2300. Though there is some report that the patient empties her own ostomy bag at times, so it is unsure how accurate this is.   Objective   Vital signs in last 24 hours: Temp:  [97.9 F (36.6 C)-98.5 F (36.9 C)] 98.2 F (36.8 C) (06/24 0426) Pulse Rate:  [64-73] 64 (06/24 0426) Resp:  [18-20] 20 (06/24 0426) BP: (129-145)/(50-68) 138/50 mmHg (06/24 0426) SpO2:  [99 %-100 %] 99 % (06/24 0426) Weight:  [97 lb (44 kg)] 97 lb (44 kg) (06/23 1700) Last BM Date: 08/02/15 General:   Caucasian female in mild emotional distress Heart:  Regular rate and rhythm; no murmurs Lungs: Respirations even and unlabored, lungs CTA bilaterally Abdomen:  Soft, nontender and nondistended. Normal bowel sounds. Ostomy bag in place has just been emptied Extremities:  Without edema. Neurologic:  Alert and oriented,  grossly normal neurologically. Psych:  Cooperative. Normal mood and affect. Moderately  distressed regarding increase in symptoms yesterday afternoon and last night  Intake/Output from previous day: 06/23 0701 - 06/24 0700 In: 2346.7 [P.O.:540; I.V.:1806.7] Out: 2656 [Urine:356; Stool:2300]  Lab Results:  Recent Labs  07/31/15 1328 08/01/15 0443 08/02/15 0513  WBC 4.6 4.1 4.4  HGB 12.1 10.5* 10.8*  HCT 33.9* 30.0* 31.1*  PLT 168 150 139*   BMET  Recent Labs  07/31/15 1328 08/01/15 0443 08/02/15 0513  NA 138 140 141  K 3.0* 2.8* 2.9*  CL 108 116* 116*  CO2 25 22 21*  GLUCOSE 114* 99 94  BUN 25* 16 9  CREATININE 0.53 0.44 0.40*  CALCIUM 9.3 8.1* 8.1*   LFT No results for input(s): PROT, ALBUMIN, AST, ALT, ALKPHOS, BILITOT, BILIDIR, IBILI in the last 72 hours. PT/INR  Recent Labs  07/31/15 1614  LABPROT 21.7*  INR 1.90*    Assessment / Plan:   Assessment: 1. C. difficile colitis-some increase in stool output compared to the day before, patient is in some distress, question relation to diet 2. Diarrhea-see above 3. Metastatic rectal cancer 4. Hypokalemia-Per family medicine they are monitoring with plans for replacement today  Plan: 1. Spend some time reassuring the patient that sometimes symptoms do take some time to get completely better. She verbalized understanding 2. Continue Vancomycin 500 mg and Metronidazole 3. Per oncology patient's Xeloda should remain stopped during her hospital stay 4. Will discuss above with Dr. Hilarie Fredrickson, please await any further recommendations  Thank you, we will  continue to follow along for Dr. Benson Norway over the weekend.  Principal Problem:   C. difficile colitis Active Problems:   Pulmonary embolism (HCC)   Gastroesophageal reflux disease   Hypertension   Ovarian mass, right, s/p robotic excision 05/17/2014   Rectal cancer s/p LAResectin/colostomy 05/17/2014   Hypothyroidism   Protein-calorie malnutrition, moderate (HCC)   Anxiety and depression   Lumbar radicular pain   Rectal cancer metastasized to liver  ([place])     LOS: 3 days   Levin Erp  08/03/2015, 8:38 AM  Pager # [phone]

## 2015-08-03 NOTE — Progress Notes (Signed)
PROGRESS NOTE    Bethany [name]  [id number] DOB: 1928-07-17 DOA: 07/31/2015 PCP: [name], MD   Brief Narrative:  82 year old female with a PMH of a rectal adenocarcinoma (T3N1) s/p chemo/XRT 9-11/2013, s/p LAR 05/2014, fibrothecoma, PE, and HTN admitted for persistent diarrhea secondary to C. Diff. In the past she had a C. Diff infection while she was being treated for her rectal cancer. Her diarrhea started earlier in May and her initial stool study was negative for C. Diff, but she was treated emperically with metronidazole x 14 days with an excellent response. One week after stopping the antibiotics she had a recurrence of her diarrhea and the stool study during this second episode was positive for C. Diff. She did not respond to the second round of treatment with metronidazole and she was subsequently started on vancomycin 125 mg QID, unfortunately, after 5-6 days of treatment she did not have a drop in her bowel movements   Assessment & Plan:   Principal Problem:   C. difficile colitis - GI on board have read their note and recommendations. For now we'll continue metronidazole and vancomycin. Continue supportive therapy. Once stools become firm will plan on discharging on 125 mg of vancomycin for a total of 6 weeks   Active Problems:   Pulmonary embolism ([place]) - Plan is to continue anticoagulants xarelto    Gastroesophageal reflux disease - In lieu of principal problem Pepcid contraindicated as such will discontinue.    Hypertension - Given age but pressure stable at this moment. We'll continue monitoring  Hypokalemia - Patient reports she did not tolerate IV replacement. Such will transition to oral replacement. If potassium level still low will have to make arrangements to try IV replacement again.    Ovarian mass, right, s/p robotic excision 05/17/2014   Rectal cancer s/p LAResectin/colostomy 05/17/2014   Hypothyroidism - stable continue synthroid    Protein-calorie  malnutrition, moderate (HCC)    Anxiety and depression   Lumbar radicular pain   Rectal cancer metastasized to liver [facility])   DVT prophylaxis: on xarelto Code Status: DNR Family Communication: d/c patient directly Disposition Plan: continued antibiotics   Consultants:   GI   Procedures: none   Antimicrobials: Flagyl and Vancomycin   Subjective: Patient is still having diarrhea. But she reports no new complaints  Objective: Filed Vitals:   08/02/15 1503 08/02/15 1700 08/02/15 2105 08/03/15 0426  BP: 145/68  129/62 138/50  Pulse: 73  68 64  Temp: 97.9 F (36.6 C)  98.5 F (36.9 C) 98.2 F (36.8 C)  TempSrc: Oral  Oral Oral  Resp: 18  19 20   Height:  5\' 7"  (1.702 m)    Weight:  44 kg (97 lb)    SpO2: 100%  99% 99%    Intake/Output Summary (Last 24 hours) at 08/03/15 1501 Last data filed at 08/03/15 1200  Gross per 24 hour  Intake 2346.67 ml  Output   3906 ml  Net -1559.33 ml   Filed Weights   08/02/15 1700  Weight: 44 kg (97 lb)    Examination:  General exam: Appears calm and comfortable  Respiratory system: Clear to auscultation. Respiratory effort normal. Cardiovascular system: S1 & S2 heard, RRR. No JVD, murmurs, rubs, gallops or clicks. No pedal edema. Gastrointestinal system: Abdomen is nondistended, soft and nontender. No organomegaly or masses felt. Normal bowel sounds heard.  Central nervous system: Alert and oriented. No focal neurological deficits. Extremities: Symmetric 5 x 5 power. Skin: No rashes,  lesions or ulcers on limited exam. Psychiatry: Mood & affect appropriate.     Data Reviewed: I have personally reviewed following labs and imaging studies  CBC:  Recent Labs Lab 07/31/15 1328 08/01/15 0443 08/02/15 0513  WBC 4.6 4.1 4.4  NEUTROABS 3.7  --   --   HGB 12.1 10.5* 10.8*  HCT 33.9* 30.0* 31.1*  MCV 102.4* 105.3* 105.8*  PLT 168 150 [id number]*   Basic Metabolic Panel:  Recent Labs Lab 07/31/15 1328 08/01/15 0441  08/01/15 0443 08/02/15 0513 08/03/15 1044  NA 138  --  140 141  --   K 3.0*  --  2.8* 2.9*  --   CL 108  --  116* 116*  --   CO2 25  --  22 21*  --   GLUCOSE 114*  --  99 94  --   BUN 25*  --  16 9  --   CREATININE 0.53  --  0.44 0.40*  --   CALCIUM 9.3  --  8.1* 8.1*  --   MG 1.5* 1.7  --   --  1.1*   GFR: Estimated Creatinine Clearance: 35.7 mL/min (by C-G formula based on Cr of 0.4). Liver Function Tests: No results for input(s): AST, ALT, ALKPHOS, BILITOT, PROT, ALBUMIN in the last 168 hours. No results for input(s): LIPASE, AMYLASE in the last 168 hours. No results for input(s): AMMONIA in the last 168 hours. Coagulation Profile:  Recent Labs Lab 07/31/15 1614  INR 1.90*   Cardiac Enzymes: No results for input(s): CKTOTAL, CKMB, CKMBINDEX, TROPONINI in the last 168 hours. BNP (last 3 results) No results for input(s): PROBNP in the last 8760 hours. HbA1C: No results for input(s): HGBA1C in the last 72 hours. CBG: No results for input(s): GLUCAP in the last 168 hours. Lipid Profile: No results for input(s): CHOL, HDL, LDLCALC, TRIG, CHOLHDL, LDLDIRECT in the last 72 hours. Thyroid Function Tests: No results for input(s): TSH, T4TOTAL, FREET4, T3FREE, THYROIDAB in the last 72 hours. Anemia Panel: No results for input(s): VITAMINB12, FOLATE, FERRITIN, TIBC, IRON, RETICCTPCT in the last 72 hours. Sepsis Labs:  Recent Labs Lab 07/31/15 1614  PROCALCITON 0.13  LATICACIDVEN 1.7    Recent Results (from the past 240 hour(s))  Culture, blood (x 2)     Status: None (Preliminary result)   Collection Time: 07/31/15  4:10 PM  Result Value Ref Range Status   Specimen Description BLOOD RIGHT WRIST  Final   Special Requests BOTTLES DRAWN AEROBIC ONLY 5 CC  Final   Culture   Final    NO GROWTH 2 DAYS Performed at [facility]    Report Status PENDING  Incomplete  Culture, blood (x 2)     Status: None (Preliminary result)   Collection Time: 07/31/15  4:15 PM   Result Value Ref Range Status   Specimen Description BLOOD RIGHT HAND  Final   Special Requests BOTTLES DRAWN AEROBIC AND ANAEROBIC 5 CC  Final   Culture   Final    NO GROWTH 2 DAYS Performed at [facility]    Report Status PENDING  Incomplete  Gastrointestinal Panel by PCR , Stool     Status: None   Collection Time: 07/31/15 10:30 PM  Result Value Ref Range Status   Campylobacter species NOT DETECTED NOT DETECTED Final   Plesimonas shigelloides NOT DETECTED NOT DETECTED Final   Salmonella species NOT DETECTED NOT DETECTED Final   Yersinia enterocolitica NOT DETECTED NOT DETECTED Final   Vibrio  species NOT DETECTED NOT DETECTED Final   Vibrio cholerae NOT DETECTED NOT DETECTED Final   Enteroaggregative E coli (EAEC) NOT DETECTED NOT DETECTED Final   Enteropathogenic E coli (EPEC) NOT DETECTED NOT DETECTED Final   Enterotoxigenic E coli (ETEC) NOT DETECTED NOT DETECTED Final   Shiga like toxin producing E coli (STEC) NOT DETECTED NOT DETECTED Final   E. coli O157 NOT DETECTED NOT DETECTED Final   Shigella/Enteroinvasive E coli (EIEC) NOT DETECTED NOT DETECTED Final   Cryptosporidium NOT DETECTED NOT DETECTED Final   Cyclospora cayetanensis NOT DETECTED NOT DETECTED Final   Entamoeba histolytica NOT DETECTED NOT DETECTED Final   Giardia lamblia NOT DETECTED NOT DETECTED Final   Adenovirus F40/41 NOT DETECTED NOT DETECTED Final   Astrovirus NOT DETECTED NOT DETECTED Final   Norovirus GI/GII NOT DETECTED NOT DETECTED Final   Rotavirus A NOT DETECTED NOT DETECTED Final   Sapovirus (I, II, IV, and V) NOT DETECTED NOT DETECTED Final  C difficile quick scan w PCR reflex     Status: None   Collection Time: 07/31/15 10:30 PM  Result Value Ref Range Status   C Diff antigen NEGATIVE NEGATIVE Final   C Diff toxin NEGATIVE NEGATIVE Final   C Diff interpretation Negative for toxigenic C. difficile  Final     Radiology Studies: No results found.   Scheduled Meds: .  cholecalciferol  2,000 Units Oral Daily  . diphenoxylate-atropine  1 tablet Oral QID  . folic acid  1 mg Oral Daily  . gabapentin  100 mg Oral TID  . levothyroxine  25 mcg Oral Once per day on Sun Mon Wed Fri  . levothyroxine  75 mcg Oral Once per day on Tue Thu Sat  . metroNIDAZOLE  500 mg Oral Q8H  . multivitamin  15 mL Oral Daily  . rivaroxaban  20 mg Oral Q supper  . saccharomyces boulardii  250 mg Oral BID  . vancomycin  500 mg Oral Q6H  . vitamin B-12  2,000 mcg Oral Daily   Continuous Infusions:     LOS: 3 days    Time spent: > 35 minutes   Velvet Bathe, MD [facility] Pager [phone]  If 7PM-7AM, please contact night-coverage [email or web address] Password [facility] 08/03/2015, 3:01 PM

## 2015-08-04 DIAGNOSIS — A047 Enterocolitis due to Clostridium difficile: Secondary | ICD-10-CM | POA: Diagnosis not present

## 2015-08-04 DIAGNOSIS — A09 Infectious gastroenteritis and colitis, unspecified: Secondary | ICD-10-CM

## 2015-08-04 LAB — BASIC METABOLIC PANEL
ANION GAP: 5 (ref 5–15)
BUN: 7 mg/dL (ref 6–20)
CHLORIDE: 114 mmol/L — AB (ref 101–111)
CO2: 23 mmol/L (ref 22–32)
CREATININE: 0.53 mg/dL (ref 0.44–1.00)
Calcium: 8.5 mg/dL — ABNORMAL LOW (ref 8.9–10.3)
GFR calc non Af Amer: >60 mL/min (ref >60)
GLUCOSE: 96 mg/dL (ref 65–99)
Potassium: 2.6 mmol/L — CL (ref 3.5–5.1)
Sodium: 142 mmol/L (ref 135–145)

## 2015-08-04 MED ORDER — POTASSIUM CHLORIDE 10 MEQ/100ML IV SOLN
10.0000 meq | INTRAVENOUS | Status: AC
  Administered 2015-08-04 (×4): 10 meq via INTRAVENOUS
  Filled 2015-08-04 (×4): qty 100

## 2015-08-04 MED ORDER — MAGNESIUM SULFATE 2 GM/50ML IV SOLN
2.0000 g | Freq: Once | INTRAVENOUS | Status: AC
  Administered 2015-08-04: 2 g via INTRAVENOUS
  Filled 2015-08-04: qty 50

## 2015-08-04 NOTE — Progress Notes (Signed)
PROGRESS NOTE    Bethany [name]  [id number] DOB: May 29, 1928 DOA: 07/31/2015 PCP: [name], MD   Brief Narrative:  82 year old female with a PMH of a rectal adenocarcinoma (T3N1) s/p chemo/XRT 9-11/2013, s/p LAR 05/2014, fibrothecoma, PE, and HTN admitted for persistent diarrhea secondary to C. Diff. In the past she had a C. Diff infection while she was being treated for her rectal cancer. Her diarrhea started earlier in May and her initial stool study was negative for C. Diff, but she was treated emperically with metronidazole x 14 days with an excellent response. One week after stopping the antibiotics she had a recurrence of her diarrhea and the stool study during this second episode was positive for C. Diff. She did not respond to the second round of treatment with metronidazole and she was subsequently started on vancomycin 125 mg QID, unfortunately, after 5-6 days of treatment she did not have a drop in her bowel movements   Assessment & Plan:   Principal Problem:   C. difficile colitis - GI on board and managing. They have placed patient on florastor and lomotil.  Active Problems:   Pulmonary embolism ([place]) - Plan is to continue anticoagulants xarelto - stable no shortness of breath.    Gastroesophageal reflux disease - In lieu of principal problem Pepcid contraindicated as such will discontinue.    Hypertension - Given age but pressure stable at this moment. We'll continue monitoring  Hypokalemia - Still hypokalemic. As such agree with continuing IV replacement  Hypomagnesemia - Most likely contributing to hypokalemia. We'll replace today    Ovarian mass, right, s/p robotic excision 05/17/2014   Rectal cancer s/p LAResectin/colostomy 05/17/2014   Hypothyroidism - stable continue synthroid    Protein-calorie malnutrition, moderate (HCC)    Anxiety and depression   Lumbar radicular pain   Rectal cancer metastasized to liver [facility])   DVT prophylaxis: on xarelto Code  Status: DNR Family Communication: d/c patient directly Disposition Plan: continued antibiotics   Consultants:   GI   Procedures: none   Antimicrobials: Flagyl and Vancomycin   Subjective: Patient reports that diarrhea is slowing down. No new complaints.   Objective: Filed Vitals:   08/03/15 0426 08/03/15 1320 08/03/15 2324 08/04/15 0657  BP: 138/50 119/61 116/72 119/73  Pulse: 64 64 84 60  Temp: 98.2 F (36.8 C) 97.5 F (36.4 C) 97.6 F (36.4 C) 97.7 F (36.5 C)  TempSrc: Oral Oral Oral Oral  Resp: 20 18 19 18   Height:      Weight:      SpO2: 99% 97% 99% 99%    Intake/Output Summary (Last 24 hours) at 08/04/15 1337 Last data filed at 08/04/15 1253  Gross per 24 hour  Intake    780 ml  Output   5925 ml  Net  -5145 ml   Filed Weights   08/02/15 1700  Weight: 44 kg (97 lb)    Examination:  General exam: Appears calm and comfortable  Respiratory system: Clear to auscultation. Respiratory effort normal. Cardiovascular system: S1 & S2 heard, RRR. No JVD, murmurs, rubs, gallops or clicks. No pedal edema. Gastrointestinal system: Abdomen is nondistended, soft and nontender. No organomegaly or masses felt. Normal bowel sounds heard.  Central nervous system: Alert and oriented. No focal neurological deficits. Extremities: Symmetric 5 x 5 power. Skin: No rashes, lesions or ulcers on limited exam. Psychiatry: Mood & affect appropriate.     Data Reviewed: I have personally reviewed following labs and imaging studies  CBC:  Recent Labs Lab 07/31/15 1328 08/01/15 0443 08/02/15 0513  WBC 4.6 4.1 4.4  NEUTROABS 3.7  --   --   HGB 12.1 10.5* 10.8*  HCT 33.9* 30.0* 31.1*  MCV 102.4* 105.3* 105.8*  PLT 168 150 [id number]*   Basic Metabolic Panel:  Recent Labs Lab 07/31/15 1328 08/01/15 0441 08/01/15 0443 08/02/15 0513 08/03/15 1044 08/04/15 0538  NA 138  --  140 141  --  142  K 3.0*  --  2.8* 2.9*  --  2.6*  CL 108  --  116* 116*  --  114*  CO2 25  --   22 21*  --  23  GLUCOSE 114*  --  99 94  --  96  BUN 25*  --  16 9  --  7  CREATININE 0.53  --  0.44 0.40*  --  0.53  CALCIUM 9.3  --  8.1* 8.1*  --  8.5*  MG 1.5* 1.7  --   --  1.1*  --    GFR: Estimated Creatinine Clearance: 35.7 mL/min (by C-G formula based on Cr of 0.53). Liver Function Tests: No results for input(s): AST, ALT, ALKPHOS, BILITOT, PROT, ALBUMIN in the last 168 hours. No results for input(s): LIPASE, AMYLASE in the last 168 hours. No results for input(s): AMMONIA in the last 168 hours. Coagulation Profile:  Recent Labs Lab 07/31/15 1614  INR 1.90*   Cardiac Enzymes: No results for input(s): CKTOTAL, CKMB, CKMBINDEX, TROPONINI in the last 168 hours. BNP (last 3 results) No results for input(s): PROBNP in the last 8760 hours. HbA1C: No results for input(s): HGBA1C in the last 72 hours. CBG: No results for input(s): GLUCAP in the last 168 hours. Lipid Profile: No results for input(s): CHOL, HDL, LDLCALC, TRIG, CHOLHDL, LDLDIRECT in the last 72 hours. Thyroid Function Tests: No results for input(s): TSH, T4TOTAL, FREET4, T3FREE, THYROIDAB in the last 72 hours. Anemia Panel: No results for input(s): VITAMINB12, FOLATE, FERRITIN, TIBC, IRON, RETICCTPCT in the last 72 hours. Sepsis Labs:  Recent Labs Lab 07/31/15 1614  PROCALCITON 0.13  LATICACIDVEN 1.7    Recent Results (from the past 240 hour(s))  Culture, blood (x 2)     Status: None (Preliminary result)   Collection Time: 07/31/15  4:10 PM  Result Value Ref Range Status   Specimen Description BLOOD RIGHT WRIST  Final   Special Requests BOTTLES DRAWN AEROBIC ONLY 5 CC  Final   Culture   Final    NO GROWTH 4 DAYS Performed at [facility]    Report Status PENDING  Incomplete  Culture, blood (x 2)     Status: None (Preliminary result)   Collection Time: 07/31/15  4:15 PM  Result Value Ref Range Status   Specimen Description BLOOD RIGHT HAND  Final   Special Requests BOTTLES DRAWN AEROBIC  AND ANAEROBIC 5 CC  Final   Culture   Final    NO GROWTH 4 DAYS Performed at [facility]    Report Status PENDING  Incomplete  Gastrointestinal Panel by PCR , Stool     Status: None   Collection Time: 07/31/15 10:30 PM  Result Value Ref Range Status   Campylobacter species NOT DETECTED NOT DETECTED Final   Plesimonas shigelloides NOT DETECTED NOT DETECTED Final   Salmonella species NOT DETECTED NOT DETECTED Final   Yersinia enterocolitica NOT DETECTED NOT DETECTED Final   Vibrio species NOT DETECTED NOT DETECTED Final   Vibrio cholerae NOT DETECTED NOT DETECTED Final  Enteroaggregative E coli (EAEC) NOT DETECTED NOT DETECTED Final   Enteropathogenic E coli (EPEC) NOT DETECTED NOT DETECTED Final   Enterotoxigenic E coli (ETEC) NOT DETECTED NOT DETECTED Final   Shiga like toxin producing E coli (STEC) NOT DETECTED NOT DETECTED Final   E. coli O157 NOT DETECTED NOT DETECTED Final   Shigella/Enteroinvasive E coli (EIEC) NOT DETECTED NOT DETECTED Final   Cryptosporidium NOT DETECTED NOT DETECTED Final   Cyclospora cayetanensis NOT DETECTED NOT DETECTED Final   Entamoeba histolytica NOT DETECTED NOT DETECTED Final   Giardia lamblia NOT DETECTED NOT DETECTED Final   Adenovirus F40/41 NOT DETECTED NOT DETECTED Final   Astrovirus NOT DETECTED NOT DETECTED Final   Norovirus GI/GII NOT DETECTED NOT DETECTED Final   Rotavirus A NOT DETECTED NOT DETECTED Final   Sapovirus (I, II, IV, and V) NOT DETECTED NOT DETECTED Final  C difficile quick scan w PCR reflex     Status: None   Collection Time: 07/31/15 10:30 PM  Result Value Ref Range Status   C Diff antigen NEGATIVE NEGATIVE Final   C Diff toxin NEGATIVE NEGATIVE Final   C Diff interpretation Negative for toxigenic C. difficile  Final     Radiology Studies: No results found.   Scheduled Meds: . cholecalciferol  2,000 Units Oral Daily  . diphenoxylate-atropine  1 tablet Oral QID  . folic acid  1 mg Oral Daily  . gabapentin   100 mg Oral TID  . levothyroxine  25 mcg Oral Once per day on Sun Mon Wed Fri  . levothyroxine  75 mcg Oral Once per day on Tue Thu Sat  . metroNIDAZOLE  500 mg Oral Q8H  . multivitamin  15 mL Oral Daily  . rivaroxaban  20 mg Oral Q supper  . saccharomyces boulardii  250 mg Oral BID  . vancomycin  500 mg Oral Q6H  . vitamin B-12  2,000 mcg Oral Daily   Continuous Infusions:     LOS: 4 days    Time spent: > 35 minutes   Velvet Bathe, MD [facility] Pager [phone]  If 7PM-7AM, please contact night-coverage [email or web address] Password [facility] 08/04/2015, 1:37 PM

## 2015-08-04 NOTE — Progress Notes (Signed)
Progress Note   Subjective  Bethany [name] Is an 82 year old Caucasian female with a history of rectal adenocarcinoma status post chemotherapy and radiation, PE and hypertension admitted for persistent diarrhea secondary to C. difficile.  This morning, the patient is found in much better spirits, sitting up in her bed with a smile. She tells me that she believes things are getting somewhat better, though she continues with "liquid stool" and wonders when this will stop. She denies any abdominal pain, but is experiencing some more gas today. She does have fears that her "bag will burst", as apparently this has happened in the past.  Per nursing the patient is allowing her to run IV potassium this morning with no complaints. Stool emptied from ostomy bag this morning was only 200 mL and did appear to have some "sort of form".   Objective   Vital signs in last 24 hours: Temp:  [97.5 F (36.4 C)-97.7 F (36.5 C)] 97.7 F (36.5 C) (06/25 0657) Pulse Rate:  [60-84] 60 (06/25 0657) Resp:  [18-19] 18 (06/25 0657) BP: (116-119)/(61-73) 119/73 mmHg (06/25 0657) SpO2:  [97 %-99 %] 99 % (06/25 0657) Last BM Date: 08/03/15 General:  Caucasian female in NAD Heart:  Regular rate and rhythm; no murmurs Lungs: Respirations even and unlabored, lungs CTA bilaterally Abdomen:  Soft, nontender and nondistended. Normal bowel sounds. Ostomy bag in place, has just been emptied. Extremities:  Without edema. Neurologic:  Alert and oriented,  grossly normal neurologically. Psych:  Cooperative. Normal mood and affect.  Intake/Output from previous day: 06/24 0701 - 06/25 0700 In: 1020 [P.O.:1020] Out: 6625 [Urine:1300; Stool:5325] Intake/Output this shift: Total I/O In: -  Out: 200 [Stool:200]  Lab Results:  Recent Labs  08/02/15 0513  WBC 4.4  HGB 10.8*  HCT 31.1*  PLT 139*   BMET  Recent Labs  08/02/15 0513 08/04/15 0538  NA 141 142  K 2.9* 2.6*  CL 116* 114*  CO2 21* 23  GLUCOSE 94  96  BUN 9 7  CREATININE 0.40* 0.53  CALCIUM 8.1* 8.5*   LFT No results for input(s): PROT, ALBUMIN, AST, ALT, ALKPHOS, BILITOT, BILIDIR, IBILI in the last 72 hours. PT/INR No results for input(s): LABPROT, INR in the last 72 hours.  Studies/Results: No results found.     Assessment / Plan:   Assessment: 1. C. difficile per intake/output record patient did have increase from 2300 mL to 5325 mL of stool yesterday, Florastor and Lomotil were added by Dr. Hilarie Fredrickson last night, patient has only received 1 dose-per nursing patient's stool does have "some sort of form" this morning 2. Diarrhea-see above 3. Metastatic rectal cancer 4. Hypokalmeia: Initially patient did not allow for IV potassium replacement as this "burned", she was given 1 oral dose yesterday, potassium continude to decrease, she is allowing IV replacement this morning-thought related to diarrhea  Plan: 1. Continue Metronidazole and Vancomycin 2. Continue Florastor 250mg  BID and Lomotil 1 QID 3. Will discuss above with Dr. Hilarie Fredrickson, please await any further recommendations  Thank you, we will continue to follow along for Dr. Benson Norway over the weekend.  Principal Problem:   C. difficile colitis Active Problems:   Pulmonary embolism (HCC)   Gastroesophageal reflux disease   Hypertension   Ovarian mass, right, s/p robotic excision 05/17/2014   Rectal cancer s/p LAResectin/colostomy 05/17/2014   Hypothyroidism   Protein-calorie malnutrition, moderate (HCC)   Anxiety and depression   Lumbar radicular pain   Rectal cancer metastasized to liver ([place])  Diarrhea     LOS: 4 days   Bethany [name]  08/04/2015, 9:16 AM  Pager # [phone]

## 2015-08-04 NOTE — Progress Notes (Signed)
CRITICAL VALUE ALERT  Critical value received:  K 2.6  Date of notification:  08/04/2015   Time of notification:  6:36 AM   Critical value read back:Yes.    Nurse who received alert:  Jeanie Sewer   MD notified (1st page):  L Harduk  Time of first page:     MD notified (2nd page):  Time of second page:  Responding MD:     Time MD responded:

## 2015-08-04 NOTE — Progress Notes (Signed)
CRITICAL VALUE ALERT  Critical value received: K 2.6  Date of notification:  08/04/2015   Time of notification:  6:33 AM   Critical value read back:Yes.    Nurse who received alert:  Jeanie Sewer   MD notified (1st page):   L Harduk  Time of first page:   MD notified (2nd page):  Time of second page:  Responding MD:   Time MD responded:

## 2015-08-05 DIAGNOSIS — Z933 Colostomy status: Secondary | ICD-10-CM | POA: Diagnosis not present

## 2015-08-05 DIAGNOSIS — K9409 Other complications of colostomy: Secondary | ICD-10-CM | POA: Diagnosis not present

## 2015-08-05 DIAGNOSIS — C787 Secondary malignant neoplasm of liver and intrahepatic bile duct: Secondary | ICD-10-CM | POA: Diagnosis present

## 2015-08-05 DIAGNOSIS — Z8249 Family history of ischemic heart disease and other diseases of the circulatory system: Secondary | ICD-10-CM | POA: Diagnosis not present

## 2015-08-05 DIAGNOSIS — R197 Diarrhea, unspecified: Secondary | ICD-10-CM | POA: Diagnosis present

## 2015-08-05 DIAGNOSIS — A047 Enterocolitis due to Clostridium difficile: Secondary | ICD-10-CM | POA: Diagnosis present

## 2015-08-05 DIAGNOSIS — Z9221 Personal history of antineoplastic chemotherapy: Secondary | ICD-10-CM | POA: Diagnosis not present

## 2015-08-05 DIAGNOSIS — F329 Major depressive disorder, single episode, unspecified: Secondary | ICD-10-CM | POA: Diagnosis present

## 2015-08-05 DIAGNOSIS — Z66 Do not resuscitate: Secondary | ICD-10-CM | POA: Diagnosis present

## 2015-08-05 DIAGNOSIS — I10 Essential (primary) hypertension: Secondary | ICD-10-CM | POA: Diagnosis present

## 2015-08-05 DIAGNOSIS — C7800 Secondary malignant neoplasm of unspecified lung: Secondary | ICD-10-CM | POA: Diagnosis present

## 2015-08-05 DIAGNOSIS — K219 Gastro-esophageal reflux disease without esophagitis: Secondary | ICD-10-CM | POA: Diagnosis present

## 2015-08-05 DIAGNOSIS — Z86711 Personal history of pulmonary embolism: Secondary | ICD-10-CM | POA: Diagnosis not present

## 2015-08-05 DIAGNOSIS — Z923 Personal history of irradiation: Secondary | ICD-10-CM | POA: Diagnosis not present

## 2015-08-05 DIAGNOSIS — Z90710 Acquired absence of both cervix and uterus: Secondary | ICD-10-CM | POA: Diagnosis not present

## 2015-08-05 DIAGNOSIS — Z681 Body mass index (BMI) 19 or less, adult: Secondary | ICD-10-CM | POA: Diagnosis not present

## 2015-08-05 DIAGNOSIS — E440 Moderate protein-calorie malnutrition: Secondary | ICD-10-CM | POA: Diagnosis not present

## 2015-08-05 DIAGNOSIS — C20 Malignant neoplasm of rectum: Secondary | ICD-10-CM | POA: Diagnosis present

## 2015-08-05 DIAGNOSIS — F419 Anxiety disorder, unspecified: Secondary | ICD-10-CM | POA: Diagnosis present

## 2015-08-05 DIAGNOSIS — E039 Hypothyroidism, unspecified: Secondary | ICD-10-CM | POA: Diagnosis present

## 2015-08-05 DIAGNOSIS — E860 Dehydration: Secondary | ICD-10-CM | POA: Diagnosis present

## 2015-08-05 DIAGNOSIS — Z833 Family history of diabetes mellitus: Secondary | ICD-10-CM | POA: Diagnosis not present

## 2015-08-05 DIAGNOSIS — M5410 Radiculopathy, site unspecified: Secondary | ICD-10-CM | POA: Diagnosis present

## 2015-08-05 DIAGNOSIS — Z7901 Long term (current) use of anticoagulants: Secondary | ICD-10-CM | POA: Diagnosis not present

## 2015-08-05 DIAGNOSIS — E876 Hypokalemia: Secondary | ICD-10-CM | POA: Diagnosis present

## 2015-08-05 DIAGNOSIS — Z808 Family history of malignant neoplasm of other organs or systems: Secondary | ICD-10-CM | POA: Diagnosis not present

## 2015-08-05 DIAGNOSIS — E43 Unspecified severe protein-calorie malnutrition: Secondary | ICD-10-CM | POA: Diagnosis present

## 2015-08-05 LAB — BASIC METABOLIC PANEL
Anion gap: 5 (ref 5–15)
BUN: 6 mg/dL (ref 6–20)
CHLORIDE: 112 mmol/L — AB (ref 101–111)
CO2: 22 mmol/L (ref 22–32)
CREATININE: 0.5 mg/dL (ref 0.44–1.00)
Calcium: 8.6 mg/dL — ABNORMAL LOW (ref 8.9–10.3)
GFR calc Af Amer: >60 mL/min (ref >60)
GFR calc non Af Amer: >60 mL/min (ref >60)
GLUCOSE: 102 mg/dL — AB (ref 65–99)
POTASSIUM: 2.6 mmol/L — AB (ref 3.5–5.1)
Sodium: 139 mmol/L (ref 135–145)

## 2015-08-05 LAB — MAGNESIUM: Magnesium: 1.4 mg/dL — ABNORMAL LOW (ref 1.7–2.4)

## 2015-08-05 LAB — CULTURE, BLOOD (ROUTINE X 2)
CULTURE: NO GROWTH
CULTURE: NO GROWTH

## 2015-08-05 MED ORDER — MAGNESIUM SULFATE 50 % IJ SOLN
3.0000 g | Freq: Once | INTRAVENOUS | Status: AC
  Administered 2015-08-05: 3 g via INTRAVENOUS
  Filled 2015-08-05: qty 6

## 2015-08-05 MED ORDER — KCL IN DEXTROSE-NACL 40-5-0.45 MEQ/L-%-% IV SOLN
INTRAVENOUS | Status: DC
  Administered 2015-08-05 – 2015-08-12 (×5): via INTRAVENOUS
  Filled 2015-08-05 (×15): qty 1000

## 2015-08-05 MED ORDER — POTASSIUM CHLORIDE 10 MEQ/100ML IV SOLN
10.0000 meq | INTRAVENOUS | Status: AC
  Administered 2015-08-05 (×6): 10 meq via INTRAVENOUS
  Filled 2015-08-05 (×7): qty 100

## 2015-08-05 NOTE — Progress Notes (Signed)
Physical Therapy Treatment Patient Details Name: KARYNNE REZENTES MRN: [id number] DOB: [birth year] Today's Date: 08/05/2015    History of Present Illness 82 yo female admitted with C-diff.     PT Comments    Pt feeling poorly.  K+ 2.6 Limited activity tolerance.  Assisted pt OOB to amb to bathroom only.  Unsteady/weak. Pt declined to sit in recliner stating it hurts her back and buttocks so assisted back to bed.  Pt c/o "burning" IV site.  Reported to RN.   Follow Up Recommendations  Home health PT;Supervision - Intermittent     Equipment Recommendations       Recommendations for Other Services       Precautions / Restrictions Precautions Precautions: Fall Restrictions Weight Bearing Restrictions: No    Mobility  Bed Mobility Overal bed mobility: Needs Assistance Bed Mobility: Supine to Sit;Sit to Supine     Supine to sit: Supervision;Min guard Sit to supine: Supervision;Min guard   General bed mobility comments: increased time as pt was not feeling well and her R hand was hurting from IV   Transfers Overall transfer level: Needs assistance Equipment used: Rolling walker (2 wheeled) Transfers: Sit to/from Stand Sit to Stand: Min guard         General transfer comment: for safety as pt was feeling weak and unsteady  Ambulation/Gait Ambulation/Gait assistance: Min guard;Min assist Ambulation Distance (Feet): 22 Feet Assistive device: Rolling walker (2 wheeled) Gait Pattern/deviations: Step-to pattern;Step-through pattern Gait velocity: decreased   General Gait Details: limited distance to and from the bathroom due to ill feeling and weak.    Stairs            Wheelchair Mobility    Modified Rankin (Stroke Patients Only)       Balance                                    Cognition Arousal/Alertness: Awake/alert Behavior During Therapy: WFL for tasks assessed/performed Overall Cognitive Status: Within Functional Limits for tasks  assessed                      Exercises      General Comments        Pertinent Vitals/Pain Pain Assessment: Faces Pain Location: R IV site Pain Descriptors / Indicators: Burning Pain Intervention(s): Monitored during session;Repositioned    Home Living                      Prior Function            PT Goals (current goals can now be found in the care plan section) Progress towards PT goals: Progressing toward goals    Frequency       PT Plan Current plan remains appropriate    Co-evaluation             End of Session Equipment Utilized During Treatment: Gait belt Activity Tolerance: Patient limited by fatigue Patient left: in bed;with call bell/phone within reach;with bed alarm set     Time: [id number] PT Time Calculation (min) (ACUTE ONLY): 12 min  Charges:  $Gait Training: 8-22 mins                    G Codes:      Rica Koyanagi  PTA [facility]  Acute  Rehab Pager      [phone]

## 2015-08-05 NOTE — Progress Notes (Signed)
CRITICAL VALUE ALERT  Critical value received:  K+ 2.6  Date of notification:  08/05/15  Time of notification:  0603  Critical value read back:Yes.    Nurse who received alert:  Gaylene Brooks  MD notified (1st page):  [place]  Time of first page:  0610  MD notified (2nd page):  Time of second page:  Responding MD:  Rachelle Hora  Time MD responded:  [phone]

## 2015-08-05 NOTE — Progress Notes (Signed)
Subjective: No feeling well.  She feels as if she is worsening.  Objective: Vital signs in last 24 hours: Temp:  [97.8 F (36.6 C)-97.9 F (36.6 C)] 97.9 F (36.6 C) (06/26 0448) Pulse Rate:  [68-72] 72 (06/26 0448) Resp:  [18] 18 (06/26 0448) BP: (130-146)/(56-68) 130/67 mmHg (06/26 0448) SpO2:  [99 %-100 %] 100 % (06/26 0448) Last BM Date: 08/05/15  Intake/Output from previous day: 06/25 0701 - 06/26 0700 In: 1080 [P.O.:1080] Out: 5800 [Urine:1650; Stool:4150] Intake/Output this shift:    General appearance: alert and no distress GI: soft, non-tender; bowel sounds normal; no masses,  no organomegaly  Lab Results: No results for input(s): WBC, HGB, HCT, PLT in the last 72 hours. BMET  Recent Labs  08/04/15 0538 08/05/15 0510  NA 142 139  K 2.6* 2.6*  CL 114* 112*  CO2 23 22  GLUCOSE 96 102*  BUN 7 6  CREATININE 0.53 0.50  CALCIUM 8.5* 8.6*   LFT No results for input(s): PROT, ALBUMIN, AST, ALT, ALKPHOS, BILITOT, BILIDIR, IBILI in the last 72 hours. PT/INR No results for input(s): LABPROT, INR in the last 72 hours. Hepatitis Panel No results for input(s): HEPBSAG, HCVAB, HEPAIGM, HEPBIGM in the last 72 hours. C-Diff No results for input(s): CDIFFTOX in the last 72 hours. Fecal Lactopherrin No results for input(s): FECLLACTOFRN in the last 72 hours.  Studies/Results: No results found.  Medications:  Scheduled: . cholecalciferol  2,000 Units Oral Daily  . diphenoxylate-atropine  1 tablet Oral QID  . folic acid  1 mg Oral Daily  . gabapentin  100 mg Oral TID  . levothyroxine  25 mcg Oral Once per day on Sun Mon Wed Fri  . levothyroxine  75 mcg Oral Once per day on Tue Thu Sat  . metroNIDAZOLE  500 mg Oral Q8H  . multivitamin  15 mL Oral Daily  . potassium chloride  10 mEq Intravenous Q1 Hr x 6  . rivaroxaban  20 mg Oral Q supper  . saccharomyces boulardii  250 mg Oral BID  . vancomycin  500 mg Oral Q6H  . vitamin B-12  2,000 mcg Oral Daily    Continuous:   Assessment/Plan: 1) C. Diff colitis. 2) Diarrhea. 3) Hypokalemia.   There does not appear to be any significant improvement over the weekend, as previously hoped.  She continues to have high stool output, if the stool recordings were accurate.  It is clear that she has hypokalemia and I am attributing this to her diarrhea.  No significant improvement with the use of Lomotil QID.  The C. Diff is felt to be the culprit at this time, but she may have an enteric colonic fistula secondary to her radiation treatment.  However, her CT scan 03/2014 did not suggest any abnormalities at that time.  Plan: 1) Continue with antibiotics. 2) Continue with Lomotil. 3) An ID consult will be beneficial.  I will call.  LOS: 5 days   Bethany [name] D 08/05/2015, 8:18 AM

## 2015-08-05 NOTE — Progress Notes (Signed)
PROGRESS NOTE    Bethany [name]  [id number] DOB: 1928/04/28 DOA: 07/31/2015 PCP: [name], MD   Brief Narrative:  82 year old female with a PMH of a rectal adenocarcinoma (T3N1) s/p chemo/XRT 9-11/2013, s/p LAR 05/2014, fibrothecoma, PE, and HTN admitted for persistent diarrhea secondary to C. Diff. In the past she had a C. Diff infection while she was being treated for her rectal cancer. Her diarrhea started earlier in May and her initial stool study was negative for C. Diff, but she was treated emperically with metronidazole x 14 days with an excellent response. One week after stopping the antibiotics she had a recurrence of her diarrhea and the stool study during this second episode was positive for C. Diff. She did not respond to the second round of treatment with metronidazole and she was subsequently started on vancomycin 125 mg QID, unfortunately, after 5-6 days of treatment she did not have a drop in her bowel movements   Assessment & Plan:   Principal Problem:   C. difficile colitis - GI on board and managing. They have placed patient on florastor and lomotil. - Pt still with high output, will place on MIVF with 40 meq of KCL - GI consulting ID.   Active Problems:   Pulmonary embolism ([place]) - Plan is to continue anticoagulants xarelto - stable no shortness of breath.    Gastroesophageal reflux disease - In lieu of principal problem Pepcid contraindicated as such will discontinue.    Hypertension - Given age but pressure stable at this moment. We'll continue monitoring  Hypokalemia - Still hypokalemic. As such agree with continuing IV replacement - secondary to principle problem listed above.   Hypomagnesemia - Most likely contributing to hypokalemia. We'll order replacement - reassess next am. Most likely due to principle problem.     Ovarian mass, right, s/p robotic excision 05/17/2014   Rectal cancer s/p LAResectin/colostomy 05/17/2014   Hypothyroidism - stable  continue synthroid    Protein-calorie malnutrition, moderate (HCC)    Anxiety and depression   Lumbar radicular pain   Rectal cancer metastasized to liver [facility])   DVT prophylaxis: on xarelto Code Status: DNR Family Communication: d/c patient directly Disposition Plan: continued antibiotics   Consultants:   GI   Procedures: none   Antimicrobials: Flagyl and Vancomycin   Subjective: Patient has no new complaints. Wants to get better soon.   Objective: Filed Vitals:   08/04/15 0657 08/04/15 1451 08/04/15 2150 08/05/15 0448  BP: 119/73 146/56 141/68 130/67  Pulse: 60 68 71 72  Temp: 97.7 F (36.5 C) 97.8 F (36.6 C) 97.8 F (36.6 C) 97.9 F (36.6 C)  TempSrc: Oral Oral Oral Oral  Resp: 18 18 18 18   Height:      Weight:      SpO2: 99% 100% 99% 100%    Intake/Output Summary (Last 24 hours) at 08/05/15 1359 Last data filed at 08/05/15 1200  Gross per 24 hour  Intake   1020 ml  Output   4450 ml  Net  -3430 ml   Filed Weights   08/02/15 1700  Weight: 44 kg (97 lb)    Examination:  General exam: Appears calm and comfortable  Respiratory system: Clear to auscultation. Respiratory effort normal. Cardiovascular system: S1 & S2 heard, RRR. No JVD, murmurs, rubs, gallops or clicks. No pedal edema. Gastrointestinal system: Abdomen is nondistended, soft and nontender. No organomegaly or masses felt. Normal bowel sounds heard.  Central nervous system: Alert and oriented. No focal neurological  deficits. Extremities: Symmetric 5 x 5 power. Skin: No rashes, lesions or ulcers on limited exam. Psychiatry: Mood & affect appropriate.   Data Reviewed: I have personally reviewed following labs and imaging studies  CBC:  Recent Labs Lab 07/31/15 1328 08/01/15 0443 08/02/15 0513  WBC 4.6 4.1 4.4  NEUTROABS 3.7  --   --   HGB 12.1 10.5* 10.8*  HCT 33.9* 30.0* 31.1*  MCV 102.4* 105.3* 105.8*  PLT 168 150 [id number]*   Basic Metabolic Panel:  Recent Labs Lab  07/31/15 1328 08/01/15 0441 08/01/15 0443 08/02/15 0513 08/03/15 1044 08/04/15 0538 08/05/15 0510  NA 138  --  140 141  --  142 139  K 3.0*  --  2.8* 2.9*  --  2.6* 2.6*  CL 108  --  116* 116*  --  114* 112*  CO2 25  --  22 21*  --  23 22  GLUCOSE 114*  --  99 94  --  96 102*  BUN 25*  --  16 9  --  7 6  CREATININE 0.53  --  0.44 0.40*  --  0.53 0.50  CALCIUM 9.3  --  8.1* 8.1*  --  8.5* 8.6*  MG 1.5* 1.7  --   --  1.1*  --  1.4*   GFR: Estimated Creatinine Clearance: 35.7 mL/min (by C-G formula based on Cr of 0.5). Liver Function Tests: No results for input(s): AST, ALT, ALKPHOS, BILITOT, PROT, ALBUMIN in the last 168 hours. No results for input(s): LIPASE, AMYLASE in the last 168 hours. No results for input(s): AMMONIA in the last 168 hours. Coagulation Profile:  Recent Labs Lab 07/31/15 1614  INR 1.90*   Cardiac Enzymes: No results for input(s): CKTOTAL, CKMB, CKMBINDEX, TROPONINI in the last 168 hours. BNP (last 3 results) No results for input(s): PROBNP in the last 8760 hours. HbA1C: No results for input(s): HGBA1C in the last 72 hours. CBG: No results for input(s): GLUCAP in the last 168 hours. Lipid Profile: No results for input(s): CHOL, HDL, LDLCALC, TRIG, CHOLHDL, LDLDIRECT in the last 72 hours. Thyroid Function Tests: No results for input(s): TSH, T4TOTAL, FREET4, T3FREE, THYROIDAB in the last 72 hours. Anemia Panel: No results for input(s): VITAMINB12, FOLATE, FERRITIN, TIBC, IRON, RETICCTPCT in the last 72 hours. Sepsis Labs:  Recent Labs Lab 07/31/15 1614  PROCALCITON 0.13  LATICACIDVEN 1.7    Recent Results (from the past 240 hour(s))  Culture, blood (x 2)     Status: None (Preliminary result)   Collection Time: 07/31/15  4:10 PM  Result Value Ref Range Status   Specimen Description BLOOD RIGHT WRIST  Final   Special Requests BOTTLES DRAWN AEROBIC ONLY 5 CC  Final   Culture   Final    NO GROWTH 4 DAYS Performed at [facility]     Report Status PENDING  Incomplete  Culture, blood (x 2)     Status: None (Preliminary result)   Collection Time: 07/31/15  4:15 PM  Result Value Ref Range Status   Specimen Description BLOOD RIGHT HAND  Final   Special Requests BOTTLES DRAWN AEROBIC AND ANAEROBIC 5 CC  Final   Culture   Final    NO GROWTH 4 DAYS Performed at [facility]    Report Status PENDING  Incomplete  Gastrointestinal Panel by PCR , Stool     Status: None   Collection Time: 07/31/15 10:30 PM  Result Value Ref Range Status   Campylobacter species NOT DETECTED NOT  DETECTED Final   Plesimonas shigelloides NOT DETECTED NOT DETECTED Final   Salmonella species NOT DETECTED NOT DETECTED Final   Yersinia enterocolitica NOT DETECTED NOT DETECTED Final   Vibrio species NOT DETECTED NOT DETECTED Final   Vibrio cholerae NOT DETECTED NOT DETECTED Final   Enteroaggregative E coli (EAEC) NOT DETECTED NOT DETECTED Final   Enteropathogenic E coli (EPEC) NOT DETECTED NOT DETECTED Final   Enterotoxigenic E coli (ETEC) NOT DETECTED NOT DETECTED Final   Shiga like toxin producing E coli (STEC) NOT DETECTED NOT DETECTED Final   E. coli O157 NOT DETECTED NOT DETECTED Final   Shigella/Enteroinvasive E coli (EIEC) NOT DETECTED NOT DETECTED Final   Cryptosporidium NOT DETECTED NOT DETECTED Final   Cyclospora cayetanensis NOT DETECTED NOT DETECTED Final   Entamoeba histolytica NOT DETECTED NOT DETECTED Final   Giardia lamblia NOT DETECTED NOT DETECTED Final   Adenovirus F40/41 NOT DETECTED NOT DETECTED Final   Astrovirus NOT DETECTED NOT DETECTED Final   Norovirus GI/GII NOT DETECTED NOT DETECTED Final   Rotavirus A NOT DETECTED NOT DETECTED Final   Sapovirus (I, II, IV, and V) NOT DETECTED NOT DETECTED Final  C difficile quick scan w PCR reflex     Status: None   Collection Time: 07/31/15 10:30 PM  Result Value Ref Range Status   C Diff antigen NEGATIVE NEGATIVE Final   C Diff toxin NEGATIVE NEGATIVE Final   C Diff  interpretation Negative for toxigenic C. difficile  Final     Radiology Studies: No results found.   Scheduled Meds: . cholecalciferol  2,000 Units Oral Daily  . diphenoxylate-atropine  1 tablet Oral QID  . folic acid  1 mg Oral Daily  . gabapentin  100 mg Oral TID  . levothyroxine  25 mcg Oral Once per day on Sun Mon Wed Fri  . levothyroxine  75 mcg Oral Once per day on Tue Thu Sat  . magnesium sulfate 1 - 4 g bolus IVPB  3 g Intravenous Once  . metroNIDAZOLE  500 mg Oral Q8H  . multivitamin  15 mL Oral Daily  . rivaroxaban  20 mg Oral Q supper  . saccharomyces boulardii  250 mg Oral BID  . vancomycin  500 mg Oral Q6H  . vitamin B-12  2,000 mcg Oral Daily   Continuous Infusions: . dextrose 5 % and 0.45 % NaCl with KCl 40 mEq/L       LOS: 5 days    Time spent: > 35 minutes   Velvet Bathe, MD [facility] Pager [phone]  If 7PM-7AM, please contact night-coverage [email or web address] Password [facility] 08/05/2015, 1:59 PM

## 2015-08-05 NOTE — Consult Note (Signed)
[facility] for Infectious Disease    Date of Admission:  07/31/2015           Day 14 oral vancomycin        Day 6 oral metronidazole       Reason for Consult: Possible persistent C. difficile colitis    Referring Physician: Dr. Carol Ada  Principal Problem:   Diarrhea Active Problems:   C. difficile colitis   Pulmonary embolism (HCC)   Gastroesophageal reflux disease   Hypertension   Ovarian mass, right, s/p robotic excision 05/17/2014   Rectal cancer s/p LAResectin/colostomy 05/17/2014   Hypothyroidism   Protein-calorie malnutrition, moderate (HCC)   Anxiety and depression   Lumbar radicular pain   Rectal cancer metastasized to liver ([place])   . cholecalciferol  2,000 Units Oral Daily  . diphenoxylate-atropine  1 tablet Oral QID  . folic acid  1 mg Oral Daily  . gabapentin  100 mg Oral TID  . levothyroxine  25 mcg Oral Once per day on Sun Mon Wed Fri  . levothyroxine  75 mcg Oral Once per day on Tue Thu Sat  . metroNIDAZOLE  500 mg Oral Q8H  . multivitamin  15 mL Oral Daily  . rivaroxaban  20 mg Oral Q supper  . saccharomyces boulardii  250 mg Oral BID  . vancomycin  500 mg Oral Q6H  . vitamin B-12  2,000 mcg Oral Daily    Recommendations: 1. Continue oral vancomycin and start taper soon 2. Discontinue metronidazole 3. Observe closely for clinical worsening on Lomotil 4. Consider abdominal CT scan to evaluate further for possible fistula   Assessment: I am not absolutely certain what is causing her current diarrhea. I will try to locate outpatient C. difficile test to see what led to her initial diagnosis. Certainly possible that this is severe persistent C. difficile colitis. I will continue a tapering course of vancomycin but do not believe that there is a current indication for dual therapy with vancomycin and metronidazole unless there is significant ileus which she does not appear to have. Dr. Benson Norway mentioned that he is concerned about the  possibility of an enteric colonic fistula causing her high colostomy output. In this situation it may be worth pursuing further diagnostic evaluation to see if that is the case. Lomotil may help if there is a fistula but can worsen toxin induced colitis so we will need to watch carefully for any clinical worsening since Lomotil was started 48 hours ago. I will follow-up tomorrow afternoon.    HPI: Bethany [name] is a 82 y.o. female who was supposedly diagnosed with C. difficile colitis last month. I do not have access to those test results. She received 210 day courses of oral metronidazole with only partial improvement. She had clinical worsening and was started on oral vancomycin on 07/23/2015 but did not improve leading to admission on 07/31/2015. Her C. difficile toxin screen at that time was negative for C. difficile antigen and toxin. She has been treated with dual therapy with vancomycin and metronidazole without improvement throughout the hospitalization. She was started on Lomotil 2 days ago but has had persistent high output from her colostomy. She has not had any fever, chills or sweats.   Review of Systems: Review of Systems  Constitutional: Positive for weight loss and malaise/fatigue. Negative for fever, chills and diaphoresis.  HENT: Negative for sore throat.   Respiratory: Negative for cough, sputum production and shortness of  breath.   Cardiovascular: Negative for chest pain.  Gastrointestinal: Positive for diarrhea. Negative for nausea, vomiting and abdominal pain.  Genitourinary: Negative for dysuria.  Skin: Negative for rash.  Neurological: Negative for headaches.    Past Medical History  Diagnosis Date  . Migraine   . GERD (gastroesophageal reflux disease)   . Hypercholesteremia   . Pulmonary embolism ([place]) 01/01/2012  / 11/28/2013  . Hypothyroidism   . Rectal mass   . Ovarian tumor     sees dr Lisbeth Renshaw ob-gyn yearly for evaluation  . Cancer ([place]) 09/29/13    rectal  adenocarcinoma  . Rectal cancer ([place]) 09/29/13    invasive adeocarcinoma  . Hx of radiation therapy 10/25/13-11/28/13    pelvis, 4500 cGy in 25 sessions  . Incontinence of urine   . Hypertension     stopped med during chemo /radiation - BP has been stable  . Numbness     "I have no feeling in my left leg" unknown cause  . Arthritis   . History of skin cancer     removed from forehead  . Rosacea   . Rectal cancer ([place])   . Frequent bowel movements   . Hx: UTI (urinary tract infection)   . Depression   . Anxiety   . Severe sepsis with septic shock ([place]) 11/28/2013  . UTI (lower urinary tract infection) 01/23/2014  . Acute encephalopathy 12/08/2013  . Clostridium difficile colitis 12/08/2013  . Cor pulmonale, acute ([place]) 01/02/2012  . Fall from standing 01/22/2014  . Hypokalemia 11/28/2013  . 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 Smoking status: Never Smoker   . Smokeless tobacco: Never Used  . Alcohol Use: No    Family History  Problem Relation Age of Onset  . Brain cancer Mother   . Heart attack Father   . Diabetes Sister   . Heart disease Sister    Allergies  Allergen Reactions  . Cataflam [Diclofenac] Other (See Comments)    Unknown; patient is unaware of allergy.  . Cephalosporins Shortness Of Breath  . Erythromycin Shortness Of Breath  . Keflex [Cephalexin] Shortness Of Breath  . Propoxyphene     Unknown rxn  . Codeine Nausea And Vomiting  . Naproxen Other (See Comments)    Unknown; patient is unaware of allergy  . Penicillins Other (See Comments)    Patient states that she CAN take this; unaware of allergy, pt/family is unable to answer penicillin related questions as they do not recall this allergy reaction at all  . Synalgos-Dc [Aspirin-Caff-Dihydrocodeine] Other (See Comments)    Unknown; patient is unaware of allergy  . Ultram [Tramadol] Other (See Comments)    Unknown; patient is unaware of allergy     OBJECTIVE: Blood pressure 123/64, pulse 66, temperature 97.9 F (36.6 C), temperature source Oral, resp. rate 18, height 5\' 7"  (1.702 m), weight 97 lb (44 kg), SpO2 100 %.  Physical Exam  Constitutional:  Very thin and weak.  Cardiovascular: Normal rate and regular rhythm.   No murmur heard. Pulmonary/Chest: Effort normal and breath sounds normal.  Abdominal: Soft. Bowel sounds are normal. She exhibits no mass. There is no tenderness.    Lab Results Lab Results  Component Value Date   WBC 4.4 08/02/2015   HGB 10.8* 08/02/2015   HCT 31.1* 08/02/2015   MCV 105.8* 08/02/2015   PLT 139* 08/02/2015    Lab Results  Component Value Date   CREATININE 0.50 08/05/2015  BUN 6 08/05/2015   NA 139 08/05/2015   K 2.6* 08/05/2015   CL 112* 08/05/2015   CO2 22 08/05/2015    Lab Results  Component Value Date   ALT 21 07/04/2015   AST 26 07/04/2015   ALKPHOS 86 07/04/2015   BILITOT 1.03 07/04/2015     Microbiology: Recent Results (from the past 240 hour(s))  Culture, blood (x 2)     Status: None   Collection Time: 07/31/15  4:10 PM  Result Value Ref Range Status   Specimen Description BLOOD RIGHT WRIST  Final   Special Requests BOTTLES DRAWN AEROBIC ONLY 5 CC  Final   Culture   Final    NO GROWTH 5 DAYS Performed at [facility]    Report Status 08/05/2015 FINAL  Final  Culture, blood (x 2)     Status: None   Collection Time: 07/31/15  4:15 PM  Result Value Ref Range Status   Specimen Description BLOOD RIGHT HAND  Final   Special Requests BOTTLES DRAWN AEROBIC AND ANAEROBIC 5 CC  Final   Culture   Final    NO GROWTH 5 DAYS Performed at [facility]    Report Status 08/05/2015 FINAL  Final  Gastrointestinal Panel by PCR , Stool     Status: None   Collection Time: 07/31/15 10:30 PM  Result Value Ref Range Status   Campylobacter species NOT DETECTED NOT DETECTED Final   Plesimonas shigelloides NOT DETECTED NOT DETECTED Final   Salmonella species NOT  DETECTED NOT DETECTED Final   Yersinia enterocolitica NOT DETECTED NOT DETECTED Final   Vibrio species NOT DETECTED NOT DETECTED Final   Vibrio cholerae NOT DETECTED NOT DETECTED Final   Enteroaggregative E coli (EAEC) NOT DETECTED NOT DETECTED Final   Enteropathogenic E coli (EPEC) NOT DETECTED NOT DETECTED Final   Enterotoxigenic E coli (ETEC) NOT DETECTED NOT DETECTED Final   Shiga like toxin producing E coli (STEC) NOT DETECTED NOT DETECTED Final   E. coli O157 NOT DETECTED NOT DETECTED Final   Shigella/Enteroinvasive E coli (EIEC) NOT DETECTED NOT DETECTED Final   Cryptosporidium NOT DETECTED NOT DETECTED Final   Cyclospora cayetanensis NOT DETECTED NOT DETECTED Final   Entamoeba histolytica NOT DETECTED NOT DETECTED Final   Giardia lamblia NOT DETECTED NOT DETECTED Final   Adenovirus F40/41 NOT DETECTED NOT DETECTED Final   Astrovirus NOT DETECTED NOT DETECTED Final   Norovirus GI/GII NOT DETECTED NOT DETECTED Final   Rotavirus A NOT DETECTED NOT DETECTED Final   Sapovirus (I, II, IV, and V) NOT DETECTED NOT DETECTED Final  C difficile quick scan w PCR reflex     Status: None   Collection Time: 07/31/15 10:30 PM  Result Value Ref Range Status   C Diff antigen NEGATIVE NEGATIVE Final   C Diff toxin NEGATIVE NEGATIVE Final   C Diff interpretation Negative for toxigenic C. difficile  Final    Michel Bickers, MD [facility] [place] [place] [phone] pager   [phone] cell 08/05/2015, 3:56 PM

## 2015-08-05 NOTE — Progress Notes (Signed)
Occupational Therapy Treatment Patient Details Name: MELLONY SCHABEL MRN: [id number] DOB: 10/29/1928 Today's Date: 08/05/2015    History of present illness 82 yo female admitted with C-diff.    OT comments  Pt making progress with OT. Pt plans to DC home with A during day  Follow Up Recommendations  Home health OT;Supervision/Assistance - 24 hour    Equipment Recommendations  None recommended by OT    Recommendations for Other Services      Precautions / Restrictions Precautions Precautions: Fall Restrictions Weight Bearing Restrictions: No       Mobility Bed Mobility Overal bed mobility: Needs Assistance Bed Mobility: Supine to Sit;Sit to Supine     Supine to sit: Supervision;Min guard Sit to supine: Supervision;Min guard   General bed mobility comments: pt sitting EOB   Transfers Overall transfer level: Needs assistance Equipment used: Rolling walker (2 wheeled) Transfers: Sit to/from [facility] Sit to Stand: Min guard Stand pivot transfers: Min guard       General transfer comment: for safety as pt was feeling weak and unsteady        ADL Overall ADL's : Needs assistance/impaired     Grooming: Standing;Wash/dry face;Wash/dry hands               Lower Body Dressing: Minimal assistance;Sit to/from stand;Cueing for compensatory techniques;Cueing for safety   Toilet Transfer: Min guard;Ambulation;Comfort height toilet   Toileting- Clothing Manipulation and Hygiene: Min guard;Sit to/from stand;Cueing for sequencing;Cueing for safety       Functional mobility during ADLs: Min guard;Cueing for safety;Cueing for sequencing General ADL Comments: Pt states she will have A during day-but will be alone at night.  Discussed putting BSC beside bed.  Pt states her bathroom is very close to her bed.                  Cognition   Behavior During Therapy: WFL for tasks assessed/performed Overall Cognitive Status: Within Functional Limits  for tasks assessed                       Extremity/Trunk Assessment                     General Comments Pt willing to work with OT but did not want to sit in bed as chair hurt her bottom    Pertinent Vitals/ Pain       Pain Assessment: No/denies pain Pain Location: R IV site Pain Descriptors / Indicators: Burning Pain Intervention(s): Monitored during session;Repositioned         Frequency Min 2X/week     Progress Toward Goals  OT Goals(current goals can now be found in the care plan section)  Progress towards OT goals: Progressing toward goals     Plan Discharge plan needs to be updated       End of Session Equipment Utilized During Treatment: Rolling walker   Activity Tolerance Patient tolerated treatment well   Patient Left in bed;with call bell/phone within reach;with bed alarm set   Nurse Communication Mobility status    Functional Assessment Tool Used: clinical judgment Functional Limitation: Self care Self Care Current Status [id number]): At least 20 percent but less than 40 percent impaired, limited or restricted Self Care Goal Status [id number]): At least 1 percent but less than 20 percent impaired, limited or restricted   Time: [id number] OT Time Calculation (min): 17 min  Charges: OT G-codes **NOT FOR INPATIENT CLASS** Functional Assessment Tool Used:  clinical judgment Functional Limitation: Self care Self Care Current Status [phone]): At least 20 percent but less than 40 percent impaired, limited or restricted Self Care Goal Status [id number]): At least 1 percent but less than 20 percent impaired, limited or restricted OT General Charges $OT Visit: 1 Procedure OT Treatments $Self Care/Home Management : 8-22 mins  Jagger Demonte, Thereasa Parkin 08/05/2015, 1:42 PM

## 2015-08-06 ENCOUNTER — Encounter (HOSPITAL_COMMUNITY): Payer: Self-pay | Admitting: Radiology

## 2015-08-06 ENCOUNTER — Other Ambulatory Visit: Payer: Medicare Other

## 2015-08-06 ENCOUNTER — Inpatient Hospital Stay (HOSPITAL_COMMUNITY): Payer: Medicare Other

## 2015-08-06 ENCOUNTER — Ambulatory Visit: Payer: Medicare Other | Admitting: Oncology

## 2015-08-06 LAB — MAGNESIUM: MAGNESIUM: 1.6 mg/dL — AB (ref 1.7–2.4)

## 2015-08-06 MED ORDER — VANCOMYCIN 50 MG/ML ORAL SOLUTION
500.0000 mg | Freq: Two times a day (BID) | ORAL | Status: DC
  Administered 2015-08-06 – 2015-08-09 (×6): 500 mg via ORAL
  Filled 2015-08-06 (×6): qty 10

## 2015-08-06 MED ORDER — MAGNESIUM SULFATE 2 GM/50ML IV SOLN
2.0000 g | Freq: Once | INTRAVENOUS | Status: AC
  Administered 2015-08-06: 2 g via INTRAVENOUS
  Filled 2015-08-06: qty 50

## 2015-08-06 MED ORDER — IOPAMIDOL (ISOVUE-300) INJECTION 61%
100.0000 mL | Freq: Once | INTRAVENOUS | Status: AC | PRN
  Administered 2015-08-06: 80 mL via INTRAVENOUS

## 2015-08-06 MED ORDER — MAGNESIUM SULFATE IN D5W 1-5 GM/100ML-% IV SOLN
1.0000 g | Freq: Once | INTRAVENOUS | Status: AC
  Administered 2015-08-06: 1 g via INTRAVENOUS
  Filled 2015-08-06: qty 100

## 2015-08-06 MED ORDER — VITAMIN D3 25 MCG (1000 UNIT) PO TABS
2000.0000 [IU] | ORAL_TABLET | Freq: Every day | ORAL | Status: DC
  Administered 2015-08-06 – 2015-08-12 (×6): 2000 [IU] via ORAL
  Filled 2015-08-06 (×6): qty 2

## 2015-08-06 MED ORDER — DIATRIZOATE MEGLUMINE & SODIUM 66-10 % PO SOLN
15.0000 mL | ORAL | Status: AC | PRN
  Administered 2015-08-06 (×2): 15 mL via ORAL
  Filled 2015-08-06 (×3): qty 30

## 2015-08-06 NOTE — Progress Notes (Signed)
Patient ID: Bethany [name], female   DOB: 11/24/1928, 82 y.o.   MRN: [id number]         [facility]    Date of Admission:  07/31/2015           Day 15 oral vancomycin  Principal Problem:   Diarrhea Active Problems:   C. difficile colitis   Pulmonary embolism (HCC)   Gastroesophageal reflux disease   Hypertension   Ovarian mass, right, s/p robotic excision 05/17/2014   Rectal cancer s/p LAResectin/colostomy 05/17/2014   Hypothyroidism   Protein-calorie malnutrition, moderate (HCC)   Anxiety and depression   Lumbar radicular pain   Rectal cancer metastasized to liver ([place])   . cholecalciferol  2,000 Units Oral Daily  . diphenoxylate-atropine  1 tablet Oral QID  . folic acid  1 mg Oral Daily  . gabapentin  100 mg Oral TID  . levothyroxine  25 mcg Oral Once per day on Sun Mon Wed Fri  . levothyroxine  75 mcg Oral Once per day on Tue Thu Sat  . magnesium sulfate 1 - 4 g bolus IVPB  1 g Intravenous Once  . multivitamin  15 mL Oral Daily  . rivaroxaban  20 mg Oral Q supper  . saccharomyces boulardii  250 mg Oral BID  . vancomycin  500 mg Oral Q6H  . vitamin B-12  2,000 mcg Oral Daily    SUBJECTIVE: She is feeling a little bit better over the past 48 hours. She has had decreased colostomy output. Her stool is still liquid. She denies having any abdominal pain, nausea or vomiting. She is anxious about being off of her chemotherapy.  Review of Systems: Review of Systems  Constitutional: Positive for weight loss and malaise/fatigue. Negative for fever, chills and diaphoresis.  Gastrointestinal: Positive for diarrhea. Negative for heartburn, nausea, vomiting and abdominal pain.  Neurological: Positive for weakness.  Psychiatric/Behavioral: The patient is nervous/anxious.     Past Medical History  Diagnosis Date  . Migraine   . GERD (gastroesophageal reflux disease)   . Hypercholesteremia   . Pulmonary embolism ([place]) 01/01/2012  / 11/28/2013  .  Hypothyroidism   . Rectal mass   . Ovarian tumor     sees dr Lisbeth Renshaw ob-gyn yearly for evaluation  . Cancer ([place]) 09/29/13    rectal adenocarcinoma  . Rectal cancer ([place]) 09/29/13    invasive adeocarcinoma  . Hx of radiation therapy 10/25/13-11/28/13    pelvis, 4500 cGy in 25 sessions  . Incontinence of urine   . Hypertension     stopped med during chemo /radiation - BP has been stable  . Numbness     "I have no feeling in my left leg" unknown cause  . Arthritis   . History of skin cancer     removed from forehead  . Rosacea   . Rectal cancer ([place])   . Frequent bowel movements   . Hx: UTI (urinary tract infection)   . Depression   . Anxiety   . Severe sepsis with septic shock ([place]) 11/28/2013  . UTI (lower urinary tract infection) 01/23/2014  . Acute encephalopathy 12/08/2013  . Clostridium difficile colitis 12/08/2013  . Cor pulmonale, acute ([place]) 01/02/2012  . Fall from standing 01/22/2014  . Hypokalemia 11/28/2013  . 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 Smoking status: Never Smoker   . Smokeless tobacco: Never Used  . Alcohol Use: No    Family History  Problem  Relation Age of Onset  . Brain cancer Mother   . Heart attack Father   . Diabetes Sister   . Heart disease Sister    Allergies  Allergen Reactions  . Cataflam [Diclofenac] Other (See Comments)    Unknown; patient is unaware of allergy.  . Cephalosporins Shortness Of Breath  . Erythromycin Shortness Of Breath  . Keflex [Cephalexin] Shortness Of Breath  . Propoxyphene     Unknown rxn  . Codeine Nausea And Vomiting  . Naproxen Other (See Comments)    Unknown; patient is unaware of allergy  . Penicillins Other (See Comments)    Patient states that she CAN take this; unaware of allergy, pt/family is unable to answer penicillin related questions as they do not recall this allergy reaction at all  . Synalgos-Dc [Aspirin-Caff-Dihydrocodeine] Other (See Comments)     Unknown; patient is unaware of allergy  . Ultram [Tramadol] Other (See Comments)    Unknown; patient is unaware of allergy    OBJECTIVE: Filed Vitals:   08/05/15 1510 08/05/15 2143 08/06/15 0406 08/06/15 1407  BP: 123/64 120/60 139/58 121/64  Pulse: 66 61 66 97  Temp: 97.9 F (36.6 C) 98.4 F (36.9 C) 97.8 F (36.6 C) 97.8 F (36.6 C)  TempSrc: Oral Oral Oral Oral  Resp: 18 18 20 18   Height:      Weight:      SpO2: 100% 100% 100% 100%   Body mass index is 15.19 kg/(m^2).  Physical Exam  Constitutional:  She is alert and comfortable resting quietly in bed.  Cardiovascular: Normal rate and regular rhythm.   No murmur heard. Pulmonary/Chest: Effort normal and breath sounds normal.  Abdominal: Soft. Bowel sounds are normal. There is no tenderness.  Her colostomy bag is empty.    Lab Results Lab Results  Component Value Date   WBC 4.4 08/02/2015   HGB 10.8* 08/02/2015   HCT 31.1* 08/02/2015   MCV 105.8* 08/02/2015   PLT 139* 08/02/2015    Lab Results  Component Value Date   CREATININE 0.50 08/05/2015   BUN 6 08/05/2015   NA 139 08/05/2015   K 2.6* 08/05/2015   CL 112* 08/05/2015   CO2 22 08/05/2015    Lab Results  Component Value Date   ALT 21 07/04/2015   AST 26 07/04/2015   ALKPHOS 86 07/04/2015   BILITOT 1.03 07/04/2015     Microbiology: Recent Results (from the past 240 hour(s))  Culture, blood (x 2)     Status: None   Collection Time: 07/31/15  4:10 PM  Result Value Ref Range Status   Specimen Description BLOOD RIGHT WRIST  Final   Special Requests BOTTLES DRAWN AEROBIC ONLY 5 CC  Final   Culture   Final    NO GROWTH 5 DAYS Performed at [facility]    Report Status 08/05/2015 FINAL  Final  Culture, blood (x 2)     Status: None   Collection Time: 07/31/15  4:15 PM  Result Value Ref Range Status   Specimen Description BLOOD RIGHT HAND  Final   Special Requests BOTTLES DRAWN AEROBIC AND ANAEROBIC 5 CC  Final   Culture   Final    NO  GROWTH 5 DAYS Performed at [facility]    Report Status 08/05/2015 FINAL  Final  Gastrointestinal Panel by PCR , Stool     Status: None   Collection Time: 07/31/15 10:30 PM  Result Value Ref Range Status   Campylobacter species NOT DETECTED NOT  DETECTED Final   Plesimonas shigelloides NOT DETECTED NOT DETECTED Final   Salmonella species NOT DETECTED NOT DETECTED Final   Yersinia enterocolitica NOT DETECTED NOT DETECTED Final   Vibrio species NOT DETECTED NOT DETECTED Final   Vibrio cholerae NOT DETECTED NOT DETECTED Final   Enteroaggregative E coli (EAEC) NOT DETECTED NOT DETECTED Final   Enteropathogenic E coli (EPEC) NOT DETECTED NOT DETECTED Final   Enterotoxigenic E coli (ETEC) NOT DETECTED NOT DETECTED Final   Shiga like toxin producing E coli (STEC) NOT DETECTED NOT DETECTED Final   E. coli O157 NOT DETECTED NOT DETECTED Final   Shigella/Enteroinvasive E coli (EIEC) NOT DETECTED NOT DETECTED Final   Cryptosporidium NOT DETECTED NOT DETECTED Final   Cyclospora cayetanensis NOT DETECTED NOT DETECTED Final   Entamoeba histolytica NOT DETECTED NOT DETECTED Final   Giardia lamblia NOT DETECTED NOT DETECTED Final   Adenovirus F40/41 NOT DETECTED NOT DETECTED Final   Astrovirus NOT DETECTED NOT DETECTED Final   Norovirus GI/GII NOT DETECTED NOT DETECTED Final   Rotavirus A NOT DETECTED NOT DETECTED Final   Sapovirus (I, II, IV, and V) NOT DETECTED NOT DETECTED Final  C difficile quick scan w PCR reflex     Status: None   Collection Time: 07/31/15 10:30 PM  Result Value Ref Range Status   C Diff antigen NEGATIVE NEGATIVE Final   C Diff toxin NEGATIVE NEGATIVE Final   C Diff interpretation Negative for toxigenic C. difficile  Final   CT abdomen and pelvis with contrast 08/06/2015  IMPRESSION: 1. Interval progression of liver and pulmonary metastases. 2. Diffuse moth-eaten appearance of the bones, a nonspecific finding that could represent diffuse osteopenia or an  infiltrative marrow process. 3. New trace layering bilateral pleural effusions. Stable trace free fluid in the deep pelvis. No focal fluid collections in the abdomen or pelvis. 4. No evidence of bowel obstruction or acute bowel inflammation. No evidence of an entero-colonic fistula.   Electronically Signed  By: Ilona Sorrel M.D.  On: 08/06/2015 13:17  ASSESSMENT: She seems to be improving over the past 48 hours. It is unclear if this is due to continued treatment of persistent C. difficile colitis with vancomycin the addition of Lomotil recently for both.  PLAN: 1. Start vancomycin taper 2. I will follow-up tomorrow 3. I discussed this with Ms. Juniper and her nephew this afternoon  Michel Bickers, MD [facility] [phone] pager   [phone] cell 08/06/2015, 4:54 PM

## 2015-08-06 NOTE — Progress Notes (Signed)
PROGRESS NOTE    Bethany [name]  [id number] DOB: 02-May-1928 DOA: 07/31/2015 PCP: [name], MD   Brief Narrative:  82 year old female with a PMH of a rectal adenocarcinoma (T3N1) s/p chemo/XRT 9-11/2013, s/p LAR 05/2014, fibrothecoma, PE, and HTN admitted for persistent diarrhea secondary to C. Diff. In the past she had a C. Diff infection while she was being treated for her rectal cancer. Her diarrhea started earlier in May and her initial stool study was negative for C. Diff, but she was treated emperically with metronidazole x 14 days with an excellent response. One week after stopping the antibiotics she had a recurrence of her diarrhea and the stool study during this second episode was positive for C. Diff. She did not respond to the second round of treatment with metronidazole and she was subsequently started on vancomycin 125 mg QID, unfortunately, after 5-6 days of treatment she did not have a drop in her bowel movements   Assessment & Plan:   Principal Problem:   C. difficile colitis - GI on board and managing. They have placed patient on florastor and lomotil. - Pt still with high output, will place on MIVF with 40 meq of KCL - GI consulting ID.  - Obtaining CT scan to assess for fistula  Active Problems:   Pulmonary embolism ([place]) - Plan is to continue anticoagulants xarelto - stable no shortness of breath.    Gastroesophageal reflux disease - In lieu of principal problem Pepcid contraindicated as such will discontinue.    Hypertension - Given age but pressure stable at this moment. We'll continue monitoring  Hypokalemia - Still hypokalemic. As such agree with continuing IV replacement - Will reassess  Hypomagnesemia - Improved after several rounds of IV replacement. We'll administer 1 g of magnesium today    Ovarian mass, right, s/p robotic excision 05/17/2014   Rectal cancer s/p LAResectin/colostomy 05/17/2014   Hypothyroidism - stable continue synthroid   Protein-calorie malnutrition, moderate (HCC)    Anxiety and depression   Lumbar radicular pain   Rectal cancer metastasized to liver [facility])   DVT prophylaxis: on xarelto Code Status: DNR Family Communication: d/c patient directly Disposition Plan: continued antibiotics   Consultants:   GI   Procedures: none   Antimicrobials: Flagyl and Vancomycin   Subjective: Patient has no new complaints. In better spirits today.  Objective: Filed Vitals:   08/05/15 1510 08/05/15 2143 08/06/15 0406 08/06/15 1407  BP: 123/64 120/60 139/58 121/64  Pulse: 66 61 66 97  Temp: 97.9 F (36.6 C) 98.4 F (36.9 C) 97.8 F (36.6 C) 97.8 F (36.6 C)  TempSrc: Oral Oral Oral Oral  Resp: 18 18 20 18   Height:      Weight:      SpO2: 100% 100% 100% 100%    Intake/Output Summary (Last 24 hours) at 08/06/15 1618 Last data filed at 08/06/15 1528  Gross per 24 hour  Intake 2031.25 ml  Output   2575 ml  Net -543.75 ml   Filed Weights   08/02/15 1700  Weight: 44 kg (97 lb)    Examination:  General exam: Appears calm and comfortable  Respiratory system: Clear to auscultation. Respiratory effort normal. Cardiovascular system: S1 & S2 heard, RRR. No JVD, murmurs, rubs, gallops or clicks. No pedal edema. Gastrointestinal system: Abdomen is nondistended, soft and nontender. No organomegaly or masses felt. Normal bowel sounds heard.  Central nervous system: Alert and oriented. No focal neurological deficits. Extremities: Symmetric 5 x 5 power. Skin: No  rashes, lesions or ulcers on limited exam. Psychiatry: Mood & affect appropriate.   Data Reviewed: I have personally reviewed following labs and imaging studies  CBC:  Recent Labs Lab 07/31/15 1328 08/01/15 0443 08/02/15 0513  WBC 4.6 4.1 4.4  NEUTROABS 3.7  --   --   HGB 12.1 10.5* 10.8*  HCT 33.9* 30.0* 31.1*  MCV 102.4* 105.3* 105.8*  PLT 168 150 [id number]*   Basic Metabolic Panel:  Recent Labs Lab 07/31/15 1328 08/01/15 0441  08/01/15 0443 08/02/15 0513 08/03/15 1044 08/04/15 0538 08/05/15 0510 08/06/15 0536  NA 138  --  140 141  --  142 139  --   K 3.0*  --  2.8* 2.9*  --  2.6* 2.6*  --   CL 108  --  116* 116*  --  114* 112*  --   CO2 25  --  22 21*  --  23 22  --   GLUCOSE 114*  --  99 94  --  96 102*  --   BUN 25*  --  16 9  --  7 6  --   CREATININE 0.53  --  0.44 0.40*  --  0.53 0.50  --   CALCIUM 9.3  --  8.1* 8.1*  --  8.5* 8.6*  --   MG 1.5* 1.7  --   --  1.1*  --  1.4* 1.6*   GFR: Estimated Creatinine Clearance: 35.7 mL/min (by C-G formula based on Cr of 0.5). Liver Function Tests: No results for input(s): AST, ALT, ALKPHOS, BILITOT, PROT, ALBUMIN in the last 168 hours. No results for input(s): LIPASE, AMYLASE in the last 168 hours. No results for input(s): AMMONIA in the last 168 hours. Coagulation Profile:  Recent Labs Lab 07/31/15 1614  INR 1.90*   Cardiac Enzymes: No results for input(s): CKTOTAL, CKMB, CKMBINDEX, TROPONINI in the last 168 hours. BNP (last 3 results) No results for input(s): PROBNP in the last 8760 hours. HbA1C: No results for input(s): HGBA1C in the last 72 hours. CBG: No results for input(s): GLUCAP in the last 168 hours. Lipid Profile: No results for input(s): CHOL, HDL, LDLCALC, TRIG, CHOLHDL, LDLDIRECT in the last 72 hours. Thyroid Function Tests: No results for input(s): TSH, T4TOTAL, FREET4, T3FREE, THYROIDAB in the last 72 hours. Anemia Panel: No results for input(s): VITAMINB12, FOLATE, FERRITIN, TIBC, IRON, RETICCTPCT in the last 72 hours. Sepsis Labs:  Recent Labs Lab 07/31/15 1614  PROCALCITON 0.13  LATICACIDVEN 1.7    Recent Results (from the past 240 hour(s))  Culture, blood (x 2)     Status: None   Collection Time: 07/31/15  4:10 PM  Result Value Ref Range Status   Specimen Description BLOOD RIGHT WRIST  Final   Special Requests BOTTLES DRAWN AEROBIC ONLY 5 CC  Final   Culture   Final    NO GROWTH 5 DAYS Performed at [facility]    Report Status 08/05/2015 FINAL  Final  Culture, blood (x 2)     Status: None   Collection Time: 07/31/15  4:15 PM  Result Value Ref Range Status   Specimen Description BLOOD RIGHT HAND  Final   Special Requests BOTTLES DRAWN AEROBIC AND ANAEROBIC 5 CC  Final   Culture   Final    NO GROWTH 5 DAYS Performed at [facility]    Report Status 08/05/2015 FINAL  Final  Gastrointestinal Panel by PCR , Stool     Status: None   Collection Time:  07/31/15 10:30 PM  Result Value Ref Range Status   Campylobacter species NOT DETECTED NOT DETECTED Final   Plesimonas shigelloides NOT DETECTED NOT DETECTED Final   Salmonella species NOT DETECTED NOT DETECTED Final   Yersinia enterocolitica NOT DETECTED NOT DETECTED Final   Vibrio species NOT DETECTED NOT DETECTED Final   Vibrio cholerae NOT DETECTED NOT DETECTED Final   Enteroaggregative E coli (EAEC) NOT DETECTED NOT DETECTED Final   Enteropathogenic E coli (EPEC) NOT DETECTED NOT DETECTED Final   Enterotoxigenic E coli (ETEC) NOT DETECTED NOT DETECTED Final   Shiga like toxin producing E coli (STEC) NOT DETECTED NOT DETECTED Final   E. coli O157 NOT DETECTED NOT DETECTED Final   Shigella/Enteroinvasive E coli (EIEC) NOT DETECTED NOT DETECTED Final   Cryptosporidium NOT DETECTED NOT DETECTED Final   Cyclospora cayetanensis NOT DETECTED NOT DETECTED Final   Entamoeba histolytica NOT DETECTED NOT DETECTED Final   Giardia lamblia NOT DETECTED NOT DETECTED Final   Adenovirus F40/41 NOT DETECTED NOT DETECTED Final   Astrovirus NOT DETECTED NOT DETECTED Final   Norovirus GI/GII NOT DETECTED NOT DETECTED Final   Rotavirus A NOT DETECTED NOT DETECTED Final   Sapovirus (I, II, IV, and V) NOT DETECTED NOT DETECTED Final  C difficile quick scan w PCR reflex     Status: None   Collection Time: 07/31/15 10:30 PM  Result Value Ref Range Status   C Diff antigen NEGATIVE NEGATIVE Final   C Diff toxin NEGATIVE NEGATIVE Final   C Diff  interpretation Negative for toxigenic C. difficile  Final     Radiology Studies: Ct Abdomen Pelvis W Contrast  08/06/2015  CLINICAL DATA:  82 year old female with rectal adenocarcinoma status post low anterior resection 05/17/2014 and radiation therapy, with liver and lung metastases, admitted with diarrhea and high colostomy output. Patient has been receiving therapy for C diff colitis. EXAM: CT ABDOMEN AND PELVIS WITH CONTRAST TECHNIQUE: Multidetector CT imaging of the abdomen and pelvis was performed using the standard protocol following bolus administration of intravenous contrast. CONTRAST:  64mL ISOVUE-300 IOPAMIDOL (ISOVUE-300) INJECTION 61% COMPARISON:  05/01/2015 PET-CT.  04/04/2014 CT abdomen/pelvis. FINDINGS: Lower chest: New trace layering bilateral pleural effusions. At least 6 pulmonary nodules scattered throughout both lung bases have increased in size since 05/01/2015. For example a medial right lower lobe 1.7 x 1.3 cm pulmonary nodule (series 5/ image 3) increased from 1.2 x 0.9 cm and a left lower lobe 1.3 x 1.0 cm pulmonary nodule (series 5/ image 3), increased from 0.8 x 0.7 cm. Hepatobiliary: There are 5 liver masses demonstrating heterogeneous hypo enhancement, which have increased in size since 05/01/2015 with representative masses as follows: - segment 2 left liver lobe 5.3 x 4.8 cm mass (series 2/image 17), increased from 3.8 x 3.4 cm - segment 8 right liver lobe 4.3 x 3.3 cm mass (series 2/image 17), increased from 3.0 x 2.4 cm - segment 7/8 right liver lobe 7.4 x 4.6 cm mass (series 2/image 17), increased from 5.6 x 4.6 cm Normal gallbladder with no radiopaque cholelithiasis. No biliary ductal dilatation. Pancreas: Normal, with no mass or duct dilation. Spleen: Normal size. No mass. Adrenals/Urinary Tract: Normal adrenals. No hydronephrosis. Simple parapelvic renal cysts in both renal sinuses. Subcentimeter hypodense renal cortical lesions in both kidneys are too small to  characterize. Normal bladder. Stomach/Bowel: Grossly normal stomach. Normal caliber small bowel with no small bowel wall thickening. Appendix not discretely visualized. Status post low anterior resection with left end-colostomy. No large bowel wall  thickening or pericolonic fat stranding. Oral contrast progresses to the end colostomy. No evidence of an entero-colonic fistula. Vascular/Lymphatic: Atherosclerotic nonaneurysmal abdominal aorta. Patent portal, splenic, hepatic and renal veins. No pathologically enlarged lymph nodes in the abdomen or pelvis. Reproductive: Status post hysterectomy, with no abnormal findings at the vaginal cuff. No adnexal mass. Other: No pneumoperitoneum. Stable trace free fluid in the deep pelvis. No focal fluid collection. Musculoskeletal: No aggressive appearing focal osseous lesions. Diffuse moth-eaten appearance of the bones. Moderate degenerative changes in the visualized thoracolumbar spine. IMPRESSION: 1. Interval progression of liver and pulmonary metastases. 2. Diffuse moth-eaten appearance of the bones, a nonspecific finding that could represent diffuse osteopenia or an infiltrative marrow process. 3. New trace layering bilateral pleural effusions. Stable trace free fluid in the deep pelvis. No focal fluid collections in the abdomen or pelvis. 4. No evidence of bowel obstruction or acute bowel inflammation. No evidence of an entero-colonic fistula. Electronically Signed   By: Ilona Sorrel M.D.   On: 08/06/2015 13:17     Scheduled Meds: . cholecalciferol  2,000 Units Oral Daily  . diphenoxylate-atropine  1 tablet Oral QID  . folic acid  1 mg Oral Daily  . gabapentin  100 mg Oral TID  . levothyroxine  25 mcg Oral Once per day on Sun Mon Wed Fri  . levothyroxine  75 mcg Oral Once per day on Tue Thu Sat  . magnesium sulfate 1 - 4 g bolus IVPB  1 g Intravenous Once  . multivitamin  15 mL Oral Daily  . rivaroxaban  20 mg Oral Q supper  . saccharomyces boulardii  250 mg  Oral BID  . vancomycin  500 mg Oral Q6H  . vitamin B-12  2,000 mcg Oral Daily   Continuous Infusions: . dextrose 5 % and 0.45 % NaCl with KCl 40 mEq/L 75 mL/hr at 08/06/15 1300     LOS: 6 days    Time spent: > 35 minutes   Velvet Bathe, MD [facility] Pager [phone]  If 7PM-7AM, please contact night-coverage [email or web address] Password [facility] 08/06/2015, 4:18 PM

## 2015-08-06 NOTE — Care Management Important Message (Addendum)
Important Message  Patient Details IM Letter given to Rhonda/Case Manager to present to Patient Name: Bethany [name] MRN: [id number] Date of Birth: 1928/10/01   Medicare Important Message Given:  Yes    Camillo Flaming 08/06/2015, 9:15 AMImportant Message  Patient Details  Name: Bethany [name] MRN: [id number] Date of Birth: 09-May-1928   Medicare Important Message Given:  Yes    Camillo Flaming 08/06/2015, 9:15 AM

## 2015-08-06 NOTE — Progress Notes (Signed)
Occupational Therapy Treatment Patient Details Name: ESTEFANIA GERECKE MRN: [id number] DOB: [birth year] Today's Date: 08/06/2015    History of present illness 82 yo female admitted with C-diff.    OT comments  Patient progressing well towards OT goals. Continue OT per plan of care.  Follow Up Recommendations  Home health OT;Supervision/Assistance - 24 hour    Equipment Recommendations  None recommended by OT    Recommendations for Other Services      Precautions / Restrictions Precautions Precautions: Fall Restrictions Weight Bearing Restrictions: No       Mobility Bed Mobility Overal bed mobility: Modified Independent                Transfers Overall transfer level: Needs assistance Equipment used: Rolling walker (2 wheeled) Transfers: Sit to/from Stand Sit to Stand: Min guard              Balance                                   ADL Overall ADL's : Needs assistance/impaired     Grooming: Oral care;Applying deodorant;Supervision/safety;Standing   Upper Body Bathing: Set up;Sitting   Lower Body Bathing: Moderate assistance;Sit to/from stand   Upper Body Dressing : Minimal assistance;Sitting Upper Body Dressing Details (indicate cue type and reason): due to IV line Lower Body Dressing: Minimal assistance;Sit to/from stand   Toilet Transfer: Min guard;Comfort height toilet;Ambulation;RW   Toileting- Water quality scientist and Hygiene: Min guard;Sit to/from stand       Functional mobility during ADLs: Min guard;Rolling walker General ADL Comments: Performed toilet transfer, toileting, sponge bath, change gown and brief, grooming in standing at sink during session.      Vision                     Perception     Praxis      Cognition   Behavior During Therapy: WFL for tasks assessed/performed Overall Cognitive Status: Within Functional Limits for tasks assessed                       Extremity/Trunk Assessment                Exercises     Shoulder Instructions       General Comments      Pertinent Vitals/ Pain       Pain Assessment: No/denies pain  Home Living                                          Prior Functioning/Environment              Frequency Min 2X/week     Progress Toward Goals  OT Goals(current goals can now be found in the care plan section)  Progress towards OT goals: Progressing toward goals  Acute Rehab OT Goals Patient Stated Goal: home  Plan Discharge plan remains appropriate    Co-evaluation                 End of Session Equipment Utilized During Treatment: Rolling walker   Activity Tolerance Patient tolerated treatment well   Patient Left in bed;with call bell/phone within reach;with nursing/sitter in room   Nurse Communication Mobility status        Time: [id number] OT Time Calculation (  min): 32 min  Charges: OT General Charges $OT Visit: 1 Procedure OT Treatments $Self Care/Home Management : 23-37 mins  Cordera Stineman A 08/06/2015, 9:07 AM

## 2015-08-06 NOTE — Progress Notes (Signed)
Subjective: No new complaints.  Objective: Vital signs in last 24 hours: Temp:  [97.8 F (36.6 C)-98.4 F (36.9 C)] 97.8 F (36.6 C) (06/27 0406) Pulse Rate:  [61-66] 66 (06/27 0406) Resp:  [18-20] 20 (06/27 0406) BP: (120-139)/(58-64) 139/58 mmHg (06/27 0406) SpO2:  [100 %] 100 % (06/27 0406) Last BM Date: 08/05/15  Intake/Output from previous day: 06/26 0701 - 06/27 0700 In: 1471.3 [P.O.:300; I.V.:1171.3] Out: 1250 [Urine:950; Stool:300] Intake/Output this shift:    General appearance: alert and no distress GI: soft, non-tender; bowel sounds normal; no masses,  no organomegaly  Lab Results: No results for input(s): WBC, HGB, HCT, PLT in the last 72 hours. BMET  Recent Labs  08/04/15 0538 08/05/15 0510  NA 142 139  K 2.6* 2.6*  CL 114* 112*  CO2 23 22  GLUCOSE 96 102*  BUN 7 6  CREATININE 0.53 0.50  CALCIUM 8.5* 8.6*   LFT No results for input(s): PROT, ALBUMIN, AST, ALT, ALKPHOS, BILITOT, BILIDIR, IBILI in the last 72 hours. PT/INR No results for input(s): LABPROT, INR in the last 72 hours. Hepatitis Panel No results for input(s): HEPBSAG, HCVAB, HEPAIGM, HEPBIGM in the last 72 hours. C-Diff No results for input(s): CDIFFTOX in the last 72 hours. Fecal Lactopherrin No results for input(s): FECLLACTOFRN in the last 72 hours.  Studies/Results: No results found.  Medications:  Scheduled: . cholecalciferol  2,000 Units Oral Daily  . diphenoxylate-atropine  1 tablet Oral QID  . folic acid  1 mg Oral Daily  . gabapentin  100 mg Oral TID  . levothyroxine  25 mcg Oral Once per day on Sun Mon Wed Fri  . levothyroxine  75 mcg Oral Once per day on Tue Thu Sat  . magnesium sulfate 1 - 4 g bolus IVPB  2 g Intravenous Once  . multivitamin  15 mL Oral Daily  . rivaroxaban  20 mg Oral Q supper  . saccharomyces boulardii  250 mg Oral BID  . vancomycin  500 mg Oral Q6H  . vitamin B-12  2,000 mcg Oral Daily   Continuous: . dextrose 5 % and 0.45 % NaCl with KCl  40 mEq/L 75 mL/hr at 08/05/15 1423    Assessment/Plan: 1) C. Diff colitis. 2) Diarrhea. 3) Metastatic rectal adenocarcinoma.   The reported I/O from 0700-1900 yesterday was 300 ml.  I am suspicious about this value as it is markedly low.  The reported stool output the day before was 4000 ml.  I am always highly suspicious about the accuracy of these reported values for I/O's.  She is not certain about the actual output yesterday.  The bag is empty this AM.  I am appreciative for Dr. Hale Bogus input.  Plan: 1) CT scan to check for fistula. 2) Continue with vancomycin.  3) Continue with electrolyte replacements.  LOS: 6 days   Zoe Nordin D 08/06/2015, 7:19 AM

## 2015-08-07 DIAGNOSIS — C20 Malignant neoplasm of rectum: Secondary | ICD-10-CM

## 2015-08-07 DIAGNOSIS — E440 Moderate protein-calorie malnutrition: Secondary | ICD-10-CM

## 2015-08-07 DIAGNOSIS — C787 Secondary malignant neoplasm of liver and intrahepatic bile duct: Secondary | ICD-10-CM

## 2015-08-07 LAB — BASIC METABOLIC PANEL
Anion gap: 3 — ABNORMAL LOW (ref 5–15)
BUN: 7 mg/dL (ref 6–20)
CHLORIDE: 109 mmol/L (ref 101–111)
CO2: 24 mmol/L (ref 22–32)
Calcium: 8.3 mg/dL — ABNORMAL LOW (ref 8.9–10.3)
Creatinine, Ser: 0.34 mg/dL — ABNORMAL LOW (ref 0.44–1.00)
GFR calc non Af Amer: >60 mL/min (ref >60)
Glucose, Bld: 104 mg/dL — ABNORMAL HIGH (ref 65–99)
POTASSIUM: 3.5 mmol/L (ref 3.5–5.1)
SODIUM: 136 mmol/L (ref 135–145)

## 2015-08-07 LAB — MAGNESIUM: MAGNESIUM: 1.7 mg/dL (ref 1.7–2.4)

## 2015-08-07 MED ORDER — ENSURE ENLIVE PO LIQD
237.0000 mL | Freq: Three times a day (TID) | ORAL | Status: DC
  Administered 2015-08-08 – 2015-08-12 (×8): 237 mL via ORAL

## 2015-08-07 NOTE — Progress Notes (Signed)
Physical Therapy Treatment Patient Details Name: Bethany [name] MRN: [id number] DOB: April 13, 1928 Today's Date: 08/07/2015    History of Present Illness 82 yo female admitted with C-diff.     PT Comments    Assisted OOB to amb a greater distance in hallway.  Still feeling "tiered" and "weak".  Assisted to bathroom.  Pt only required Supervision level.  Slightly anxious/nervous about her IV.  Positioned in recliner with multiple pillows.    Follow Up Recommendations  Home health PT;Supervision - Intermittent     Equipment Recommendations  None recommended by PT    Recommendations for Other Services       Precautions / Restrictions Precautions Precautions: Fall Restrictions Weight Bearing Restrictions: No    Mobility  Bed Mobility Overal bed mobility: Needs Assistance Bed Mobility: Supine to Sit     Supine to sit: Supervision     General bed mobility comments: assist with IV and increased time  Transfers Overall transfer level: Needs assistance Equipment used: Rolling walker (2 wheeled) Transfers: Sit to/from Stand Sit to Stand: Supervision Stand pivot transfers: Supervision       General transfer comment: for safety as pt was feeling weak and unsteady plus nervous about pulling her IV out  Ambulation/Gait Ambulation/Gait assistance: Supervision;Min guard Ambulation Distance (Feet): 85 Feet Assistive device: Rolling walker (2 wheeled) Gait Pattern/deviations: Step-through pattern Gait velocity: decreased   General Gait Details: tolareated increased distance.  Good use of walker.  Knowledgable about her own abilities.     Stairs            Wheelchair Mobility    Modified Rankin (Stroke Patients Only)       Balance                                    Cognition Arousal/Alertness: Awake/alert Behavior During Therapy: WFL for tasks assessed/performed;Anxious Overall Cognitive Status: Within Functional Limits for tasks assessed                       Exercises      General Comments        Pertinent Vitals/Pain Pain Assessment: No/denies pain    Home Living                      Prior Function            PT Goals (current goals can now be found in the care plan section) Progress towards PT goals: Progressing toward goals    Frequency  Min 3X/week    PT Plan Current plan remains appropriate    Co-evaluation             End of Session Equipment Utilized During Treatment: Gait belt Activity Tolerance: Patient tolerated treatment well Patient left: with call bell/phone within reach;with chair alarm set     Time: 1111-1136 PT Time Calculation (min) (ACUTE ONLY): 25 min  Charges:  $Gait Training: 8-22 mins $Therapeutic Activity: 8-22 mins                    G Codes:      Rica Koyanagi  PTA [facility]  Acute  Rehab Pager      [phone]

## 2015-08-07 NOTE — Progress Notes (Signed)
PROGRESS NOTE                                                                                                                                                                                                             Patient Demographics:    Bethany [name], is a 82 y.o. female, DOB - [birth year], [id number]  Admit date - 07/31/2015   Admitting Physician Ivor Costa, MD  Outpatient Primary MD for the patient is [name], MD  LOS - 7  Outpatient Specialists: none  Chief Complaint  Patient presents with  . c diff        Brief Narrative   82 year old female with history of rectal adenocarcinoma (T3N1) s/p chemo/XRT 9-11/2013, s/p LAR 05/2014, fibrothecoma, PE, and HTN admitted for persistent diarrhea secondary to C. Diff. In the past she had a C. Diff infection while she was being treated for her rectal cancer. Her diarrhea started earlier in May and her initial stool study was negative for C. Diff, but she was treated emperically with metronidazole x 14 days with an excellent response. One week after stopping the antibiotics she had a recurrence of her diarrhea and the stool study during this second episode was positive for C. Diff. She did not respond to the second round of treatment with metronidazole and she was subsequently started on vancomycin 125 mg QID. , after 5-6 days of treatment she did not have a drop in her bowel movements.   Subjective:   . No nausea or vomiting. Able to ambulate with the help of physical therapy. Colostomy output improving.   Assessment  & Plan :    Principal Problem:  C. difficile colitis Persistent C. difficile diarrhea being treated with slow vancomycin taper. Still has high Colostomy output (2500 mL), improving with Lomotil and vancomycin. As per ID vancomycin taper regimen will be, 500 mg orally twice daily for 7 days, then 125 mg orally twice daily for 7 days, then 125 mg orally once  daily for 7 days, then 125 mg orally every other day for 7 days, then 125 mg orally every 3 days for 14 days.  Active Problems:   Pulmonary embolism (HCC)  stable. Continue xarelto.    Gastroesophageal reflux disease D/c pepcid.    essential Hypertension   Stable  Hypokalemia/ hypomagnesemia Replenished aggressively   Hypothyroidism continue synthroid  Protein-calorie malnutrition, moderate (HCC) Continue supplement    Ovarian mass, right, s/p robotic excision 05/17/2014    Rectal cancer s/p LAResectin/colostomy 05/17/2014 Noted for liver metastases. Follows with Dr. Alen Blew. xeloda on hold while hospitalized. As per oncologist note from May, considering liver directed therapy if progression noted on repeat imaging.         Code Status : DO NOT RESUSCITATE  Family Communication  : None at bedside  Disposition Plan  : Home with home health once colostomy output improves.  Barriers For Discharge : Improving symptoms  Consults  :   [facility] (Dr. Benson Norway  Procedures  :  CT abdomen and pelvis  DVT Prophylaxis  :  Xarelto  Lab Results  Component Value Date   PLT 139* 08/02/2015    Antibiotics  :  Anti-infectives    Start     Dose/Rate Route Frequency Ordered Stop   08/06/15 2200  vancomycin (VANCOCIN) 50 mg/mL oral solution 500 mg     500 mg Oral Every 12 hours 08/06/15 1703     08/03/15 1400  metroNIDAZOLE (FLAGYL) tablet 500 mg  Status:  Discontinued     500 mg Oral Every 8 hours 08/03/15 1358 08/05/15 1604   07/31/15 1800  vancomycin (VANCOCIN) 50 mg/mL oral solution 500 mg  Status:  Discontinued     500 mg Oral Every 6 hours 07/31/15 1504 08/06/15 1703   07/31/15 1600  metroNIDAZOLE (FLAGYL) IVPB 500 mg  Status:  Discontinued     500 mg 100 mL/hr over 60 Minutes Intravenous Every 8 hours 07/31/15 1504 08/03/15 1358   07/31/15 1330  vancomycin (VANCOCIN) 50 mg/mL oral solution 125 mg     125 mg Oral  Once 07/31/15 1315 07/31/15 1441        Objective:    Filed Vitals:   08/06/15 1407 08/06/15 2044 08/07/15 0425 08/07/15 1500  BP: 121/64 112/53 120/62 126/57  Pulse: 97 72 80 77  Temp: 97.8 F (36.6 C) 98.5 F (36.9 C) 98.1 F (36.7 C) 97.8 F (36.6 C)  TempSrc: Oral Oral Oral Oral  Resp: 18 20 18 16   Height:      Weight:      SpO2: 100% 100% 98% 100%    Wt Readings from Last 3 Encounters:  08/02/15 44 kg (97 lb)  07/04/15 56.201 kg (123 lb 14.4 oz)  06/05/15 57.698 kg (127 lb 3.2 oz)     Intake/Output Summary (Last 24 hours) at 08/07/15 1606 Last data filed at 08/07/15 1515  Gross per 24 hour  Intake   2095 ml  Output   1200 ml  Net    895 ml     Physical Exam  Gen: Elderly thin built female appears frail, not in distress HEENT: no pallor, moist mucosa, supple neck Chest: clear b/l, no added sounds CVS: N S1&S2, no murmurs, rubs or gallop GI: soft, NT, ND, BS+, colostomy bag empty on exam Musculoskeletal: warm, no edema CNS: Alert and oriented, nonfocal    Data Review:    CBC  Recent Labs Lab 08/01/15 0443 08/02/15 0513  WBC 4.1 4.4  HGB 10.5* 10.8*  HCT 30.0* 31.1*  PLT 150 139*  MCV 105.3* 105.8*  MCH 36.8* 36.7*  MCHC 35.0 34.7  RDW 16.5* 16.6*    Chemistries   Recent Labs Lab 08/01/15 0441 08/01/15 0443 08/02/15 0513 08/03/15 1044 08/04/15 0538 08/05/15 0510 08/06/15 0536 08/07/15 0506  NA  --  140 141  --  142 139  --  136  K  --  2.8* 2.9*  --  2.6* 2.6*  --  3.5  CL  --  116* 116*  --  114* 112*  --  109  CO2  --  22 21*  --  23 22  --  24  GLUCOSE  --  99 94  --  96 102*  --  104*  BUN  --  16 9  --  7 6  --  7  CREATININE  --  0.44 0.40*  --  0.53 0.50  --  0.34*  CALCIUM  --  8.1* 8.1*  --  8.5* 8.6*  --  8.3*  MG 1.7  --   --  1.1*  --  1.4* 1.6* 1.7   ------------------------------------------------------------------------------------------------------------------ No results for input(s): CHOL, HDL, LDLCALC, TRIG, CHOLHDL, LDLDIRECT in the last 72 hours.  Lab  Results  Component Value Date   HGBA1C 5.2 05/09/2014   ------------------------------------------------------------------------------------------------------------------ No results for input(s): TSH, T4TOTAL, T3FREE, THYROIDAB in the last 72 hours.  Invalid input(s): FREET3 ------------------------------------------------------------------------------------------------------------------ No results for input(s): VITAMINB12, FOLATE, FERRITIN, TIBC, IRON, RETICCTPCT in the last 72 hours.  Coagulation profile  Recent Labs Lab 07/31/15 1614  INR 1.90*    No results for input(s): DDIMER in the last 72 hours.  Cardiac Enzymes No results for input(s): CKMB, TROPONINI, MYOGLOBIN in the last 168 hours.  Invalid input(s): CK ------------------------------------------------------------------------------------------------------------------ No results found for: BNP  Inpatient Medications  Scheduled Meds: . cholecalciferol  2,000 Units Oral Daily  . diphenoxylate-atropine  1 tablet Oral QID  . feeding supplement (ENSURE ENLIVE)  237 mL Oral TID BM  . folic acid  1 mg Oral Daily  . gabapentin  100 mg Oral TID  . levothyroxine  25 mcg Oral Once per day on Sun Mon Wed Fri  . levothyroxine  75 mcg Oral Once per day on Tue Thu Sat  . multivitamin  15 mL Oral Daily  . rivaroxaban  20 mg Oral Q supper  . saccharomyces boulardii  250 mg Oral BID  . vancomycin  500 mg Oral Q12H  . vitamin B-12  2,000 mcg Oral Daily   Continuous Infusions: . dextrose 5 % and 0.45 % NaCl with KCl 40 mEq/L 75 mL/hr at 08/06/15 1900   PRN Meds:.acetaminophen, clobetasol cream, lactose free nutrition, ondansetron **OR** ondansetron (ZOFRAN) IV, PEDIALYTE  Micro Results Recent Results (from the past 240 hour(s))  Culture, blood (x 2)     Status: None   Collection Time: 07/31/15  4:10 PM  Result Value Ref Range Status   Specimen Description BLOOD RIGHT WRIST  Final   Special Requests BOTTLES DRAWN AEROBIC  ONLY 5 CC  Final   Culture   Final    NO GROWTH 5 DAYS Performed at [facility]    Report Status 08/05/2015 FINAL  Final  Culture, blood (x 2)     Status: None   Collection Time: 07/31/15  4:15 PM  Result Value Ref Range Status   Specimen Description BLOOD RIGHT HAND  Final   Special Requests BOTTLES DRAWN AEROBIC AND ANAEROBIC 5 CC  Final   Culture   Final    NO GROWTH 5 DAYS Performed at [facility]    Report Status 08/05/2015 FINAL  Final  Gastrointestinal Panel by PCR , Stool     Status: None   Collection Time: 07/31/15 10:30 PM  Result Value Ref Range Status   Campylobacter species NOT DETECTED NOT DETECTED Final   Plesimonas  shigelloides NOT DETECTED NOT DETECTED Final   Salmonella species NOT DETECTED NOT DETECTED Final   Yersinia enterocolitica NOT DETECTED NOT DETECTED Final   Vibrio species NOT DETECTED NOT DETECTED Final   Vibrio cholerae NOT DETECTED NOT DETECTED Final   Enteroaggregative E coli (EAEC) NOT DETECTED NOT DETECTED Final   Enteropathogenic E coli (EPEC) NOT DETECTED NOT DETECTED Final   Enterotoxigenic E coli (ETEC) NOT DETECTED NOT DETECTED Final   Shiga like toxin producing E coli (STEC) NOT DETECTED NOT DETECTED Final   E. coli O157 NOT DETECTED NOT DETECTED Final   Shigella/Enteroinvasive E coli (EIEC) NOT DETECTED NOT DETECTED Final   Cryptosporidium NOT DETECTED NOT DETECTED Final   Cyclospora cayetanensis NOT DETECTED NOT DETECTED Final   Entamoeba histolytica NOT DETECTED NOT DETECTED Final   Giardia lamblia NOT DETECTED NOT DETECTED Final   Adenovirus F40/41 NOT DETECTED NOT DETECTED Final   Astrovirus NOT DETECTED NOT DETECTED Final   Norovirus GI/GII NOT DETECTED NOT DETECTED Final   Rotavirus A NOT DETECTED NOT DETECTED Final   Sapovirus (I, II, IV, and V) NOT DETECTED NOT DETECTED Final  C difficile quick scan w PCR reflex     Status: None   Collection Time: 07/31/15 10:30 PM  Result Value Ref Range Status   C Diff  antigen NEGATIVE NEGATIVE Final   C Diff toxin NEGATIVE NEGATIVE Final   C Diff interpretation Negative for toxigenic C. difficile  Final    Radiology Reports Ct Abdomen Pelvis W Contrast  08/06/2015  CLINICAL DATA:  82 year old female with rectal adenocarcinoma status post low anterior resection 05/17/2014 and radiation therapy, with liver and lung metastases, admitted with diarrhea and high colostomy output. Patient has been receiving therapy for C diff colitis. EXAM: CT ABDOMEN AND PELVIS WITH CONTRAST TECHNIQUE: Multidetector CT imaging of the abdomen and pelvis was performed using the standard protocol following bolus administration of intravenous contrast. CONTRAST:  93mL ISOVUE-300 IOPAMIDOL (ISOVUE-300) INJECTION 61% COMPARISON:  05/01/2015 PET-CT.  04/04/2014 CT abdomen/pelvis. FINDINGS: Lower chest: New trace layering bilateral pleural effusions. At least 6 pulmonary nodules scattered throughout both lung bases have increased in size since 05/01/2015. For example a medial right lower lobe 1.7 x 1.3 cm pulmonary nodule (series 5/ image 3) increased from 1.2 x 0.9 cm and a left lower lobe 1.3 x 1.0 cm pulmonary nodule (series 5/ image 3), increased from 0.8 x 0.7 cm. Hepatobiliary: There are 5 liver masses demonstrating heterogeneous hypo enhancement, which have increased in size since 05/01/2015 with representative masses as follows: - segment 2 left liver lobe 5.3 x 4.8 cm mass (series 2/image 17), increased from 3.8 x 3.4 cm - segment 8 right liver lobe 4.3 x 3.3 cm mass (series 2/image 17), increased from 3.0 x 2.4 cm - segment 7/8 right liver lobe 7.4 x 4.6 cm mass (series 2/image 17), increased from 5.6 x 4.6 cm Normal gallbladder with no radiopaque cholelithiasis. No biliary ductal dilatation. Pancreas: Normal, with no mass or duct dilation. Spleen: Normal size. No mass. Adrenals/Urinary Tract: Normal adrenals. No hydronephrosis. Simple parapelvic renal cysts in both renal sinuses.  Subcentimeter hypodense renal cortical lesions in both kidneys are too small to characterize. Normal bladder. Stomach/Bowel: Grossly normal stomach. Normal caliber small bowel with no small bowel wall thickening. Appendix not discretely visualized. Status post low anterior resection with left end-colostomy. No large bowel wall thickening or pericolonic fat stranding. Oral contrast progresses to the end colostomy. No evidence of an entero-colonic fistula. Vascular/Lymphatic: Atherosclerotic nonaneurysmal abdominal  aorta. Patent portal, splenic, hepatic and renal veins. No pathologically enlarged lymph nodes in the abdomen or pelvis. Reproductive: Status post hysterectomy, with no abnormal findings at the vaginal cuff. No adnexal mass. Other: No pneumoperitoneum. Stable trace free fluid in the deep pelvis. No focal fluid collection. Musculoskeletal: No aggressive appearing focal osseous lesions. Diffuse moth-eaten appearance of the bones. Moderate degenerative changes in the visualized thoracolumbar spine. IMPRESSION: 1. Interval progression of liver and pulmonary metastases. 2. Diffuse moth-eaten appearance of the bones, a nonspecific finding that could represent diffuse osteopenia or an infiltrative marrow process. 3. New trace layering bilateral pleural effusions. Stable trace free fluid in the deep pelvis. No focal fluid collections in the abdomen or pelvis. 4. No evidence of bowel obstruction or acute bowel inflammation. No evidence of an entero-colonic fistula. Electronically Signed   By: Ilona Sorrel M.D.   On: 08/06/2015 13:17    Time Spent in minutes  25   Louellen Molder M.D on 08/07/2015 at 4:06 PM  Between 7am to 7pm - Pager - [phone]  After 7pm go to [email or web address] - password [facility]  [facility] -  Office  [phone]

## 2015-08-07 NOTE — Progress Notes (Signed)
Patient ID: Bethany [name], female   DOB: 05-May-1928, 82 y.o.   MRN: [id number]         [facility]    Date of Admission:  07/31/2015           Day 16 oral vancomycin  Principal Problem:   Diarrhea Active Problems:   C. difficile colitis   Pulmonary embolism (HCC)   Gastroesophageal reflux disease   Hypertension   Ovarian mass, right, s/p robotic excision 05/17/2014   Rectal cancer s/p LAResectin/colostomy 05/17/2014   Hypothyroidism   Protein-calorie malnutrition, moderate (HCC)   Anxiety and depression   Lumbar radicular pain   Rectal cancer metastasized to liver ([place])   . cholecalciferol  2,000 Units Oral Daily  . diphenoxylate-atropine  1 tablet Oral QID  . feeding supplement (ENSURE ENLIVE)  237 mL Oral TID BM  . folic acid  1 mg Oral Daily  . gabapentin  100 mg Oral TID  . levothyroxine  25 mcg Oral Once per day on Sun Mon Wed Fri  . levothyroxine  75 mcg Oral Once per day on Tue Thu Sat  . multivitamin  15 mL Oral Daily  . rivaroxaban  20 mg Oral Q supper  . saccharomyces boulardii  250 mg Oral BID  . vancomycin  500 mg Oral Q12H  . vitamin B-12  2,000 mcg Oral Daily    SUBJECTIVE: She is feeling better. She was able to walk in the hallway with a physical therapist today and set up for 2 hours. She had a fleeting episode of abdominal cramping this morning. She is not having any nausea or vomiting.  Review of Systems: Review of Systems  Constitutional: Positive for weight loss and malaise/fatigue. Negative for fever, chills and diaphoresis.  Gastrointestinal: Positive for diarrhea. Negative for heartburn, nausea, vomiting and abdominal pain.  Neurological: Positive for weakness.  Psychiatric/Behavioral: The patient is nervous/anxious.     Past Medical History  Diagnosis Date  . Migraine   . GERD (gastroesophageal reflux disease)   . Hypercholesteremia   . Pulmonary embolism ([place]) 01/01/2012  / 11/28/2013  . Hypothyroidism   . Rectal  mass   . Ovarian tumor     sees dr Lisbeth Renshaw ob-gyn yearly for evaluation  . Cancer ([place]) 09/29/13    rectal adenocarcinoma  . Rectal cancer ([place]) 09/29/13    invasive adeocarcinoma  . Hx of radiation therapy 10/25/13-11/28/13    pelvis, 4500 cGy in 25 sessions  . Incontinence of urine   . Hypertension     stopped med during chemo /radiation - BP has been stable  . Numbness     "I have no feeling in my left leg" unknown cause  . Arthritis   . History of skin cancer     removed from forehead  . Rosacea   . Rectal cancer ([place])   . Frequent bowel movements   . Hx: UTI (urinary tract infection)   . Depression   . Anxiety   . Severe sepsis with septic shock ([place]) 11/28/2013  . UTI (lower urinary tract infection) 01/23/2014  . Acute encephalopathy 12/08/2013  . Clostridium difficile colitis 12/08/2013  . Cor pulmonale, acute ([place]) 01/02/2012  . Fall from standing 01/22/2014  . Hypokalemia 11/28/2013  . 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 Smoking status: Never Smoker   . Smokeless tobacco: Never Used  . Alcohol Use: No    Family History  Problem Relation Age of  Onset  . Brain cancer Mother   . Heart attack Father   . Diabetes Sister   . Heart disease Sister    Allergies  Allergen Reactions  . Cataflam [Diclofenac] Other (See Comments)    Unknown; patient is unaware of allergy.  . Cephalosporins Shortness Of Breath  . Erythromycin Shortness Of Breath  . Keflex [Cephalexin] Shortness Of Breath  . Propoxyphene     Unknown rxn  . Codeine Nausea And Vomiting  . Naproxen Other (See Comments)    Unknown; patient is unaware of allergy  . Penicillins Other (See Comments)    Patient states that she CAN take this; unaware of allergy, pt/family is unable to answer penicillin related questions as they do not recall this allergy reaction at all  . Synalgos-Dc [Aspirin-Caff-Dihydrocodeine] Other (See Comments)    Unknown; patient is unaware of  allergy  . Ultram [Tramadol] Other (See Comments)    Unknown; patient is unaware of allergy    OBJECTIVE: Filed Vitals:   08/06/15 1407 08/06/15 2044 08/07/15 0425 08/07/15 1500  BP: 121/64 112/53 120/62 126/57  Pulse: 97 72 80 77  Temp: 97.8 F (36.6 C) 98.5 F (36.9 C) 98.1 F (36.7 C) 97.8 F (36.6 C)  TempSrc: Oral Oral Oral Oral  Resp: 18 20 18 16   Height:      Weight:      SpO2: 100% 100% 98% 100%   Body mass index is 15.19 kg/(m^2).  Physical Exam  Constitutional: She is oriented to person, place, and time.  She is in better spirits today. She is resting quietly in bed.  Cardiovascular: Normal rate and regular rhythm.   No murmur heard. Pulmonary/Chest: Effort normal and breath sounds normal.  Abdominal: Soft. Bowel sounds are normal. There is no tenderness.  Her colostomy bag is empty.  Neurological: She is alert and oriented to person, place, and time.  Skin: No rash noted.  Psychiatric: Mood and affect normal.    Lab Results Lab Results  Component Value Date   WBC 4.4 08/02/2015   HGB 10.8* 08/02/2015   HCT 31.1* 08/02/2015   MCV 105.8* 08/02/2015   PLT 139* 08/02/2015    Lab Results  Component Value Date   CREATININE 0.34* 08/07/2015   BUN 7 08/07/2015   NA 136 08/07/2015   K 3.5 08/07/2015   CL 109 08/07/2015   CO2 24 08/07/2015    Lab Results  Component Value Date   ALT 21 07/04/2015   AST 26 07/04/2015   ALKPHOS 86 07/04/2015   BILITOT 1.03 07/04/2015     Microbiology: Recent Results (from the past 240 hour(s))  Culture, blood (x 2)     Status: None   Collection Time: 07/31/15  4:10 PM  Result Value Ref Range Status   Specimen Description BLOOD RIGHT WRIST  Final   Special Requests BOTTLES DRAWN AEROBIC ONLY 5 CC  Final   Culture   Final    NO GROWTH 5 DAYS Performed at [facility]    Report Status 08/05/2015 FINAL  Final  Culture, blood (x 2)     Status: None   Collection Time: 07/31/15  4:15 PM  Result Value Ref Range  Status   Specimen Description BLOOD RIGHT HAND  Final   Special Requests BOTTLES DRAWN AEROBIC AND ANAEROBIC 5 CC  Final   Culture   Final    NO GROWTH 5 DAYS Performed at [facility]    Report Status 08/05/2015 FINAL  Final  Gastrointestinal  Panel by PCR , Stool     Status: None   Collection Time: 07/31/15 10:30 PM  Result Value Ref Range Status   Campylobacter species NOT DETECTED NOT DETECTED Final   Plesimonas shigelloides NOT DETECTED NOT DETECTED Final   Salmonella species NOT DETECTED NOT DETECTED Final   Yersinia enterocolitica NOT DETECTED NOT DETECTED Final   Vibrio species NOT DETECTED NOT DETECTED Final   Vibrio cholerae NOT DETECTED NOT DETECTED Final   Enteroaggregative E coli (EAEC) NOT DETECTED NOT DETECTED Final   Enteropathogenic E coli (EPEC) NOT DETECTED NOT DETECTED Final   Enterotoxigenic E coli (ETEC) NOT DETECTED NOT DETECTED Final   Shiga like toxin producing E coli (STEC) NOT DETECTED NOT DETECTED Final   E. coli O157 NOT DETECTED NOT DETECTED Final   Shigella/Enteroinvasive E coli (EIEC) NOT DETECTED NOT DETECTED Final   Cryptosporidium NOT DETECTED NOT DETECTED Final   Cyclospora cayetanensis NOT DETECTED NOT DETECTED Final   Entamoeba histolytica NOT DETECTED NOT DETECTED Final   Giardia lamblia NOT DETECTED NOT DETECTED Final   Adenovirus F40/41 NOT DETECTED NOT DETECTED Final   Astrovirus NOT DETECTED NOT DETECTED Final   Norovirus GI/GII NOT DETECTED NOT DETECTED Final   Rotavirus A NOT DETECTED NOT DETECTED Final   Sapovirus (I, II, IV, and V) NOT DETECTED NOT DETECTED Final  C difficile quick scan w PCR reflex     Status: None   Collection Time: 07/31/15 10:30 PM  Result Value Ref Range Status   C Diff antigen NEGATIVE NEGATIVE Final   C Diff toxin NEGATIVE NEGATIVE Final   C Diff interpretation Negative for toxigenic C. difficile  Final   ASSESSMENT: I called Dr. Julianne Rice office and learned that her stool was negative for C. difficile  toxin on 06/20/2015 but was positive on repeat testing on 07/10/2015. She Has been improving over the past 48 hours with a marked decrease in colostomy output. It is unclear if this is due to continued treatment of persistent C. difficile colitis with vancomycin the addition of Lomotil recently for both. I must assume that her recent diarrhea was due to stubborn, persistent C. difficile diarrhea. I will continue a slow vancomycin taper as outlined below. Normally I would not want to use antimotility agents in the setting of C. difficile colitis but her dramatic improvement did coincide with starting Lomotil. As long as she does not get worse I feel the lesser evil is to continue it along with the vancomycin taper.  500 mg orally twice daily for 7 days, then 125 mg orally twice daily for 7 days, then 125 mg orally once daily for 7 days, then 125 mg orally every other day for 7 days, then 125 mg orally every 3 days for 14 days   PLAN: 1. Continue vancomycin taper  Michel Bickers, MD [facility] [place] [phone] pager   [phone] cell 08/07/2015, 3:27 PM

## 2015-08-07 NOTE — Progress Notes (Signed)
Initial Nutrition Assessment  DOCUMENTATION CODES:   Severe malnutrition in context of chronic illness  INTERVENTION:  -Ensure Enlive po BID, each supplement provides 350 kcal and 20 grams of protein -Encourage PO intake  NUTRITION DIAGNOSIS:   Malnutrition related to chronic illness as evidenced by severe depletion of muscle mass, severe depletion of body fat, percent weight loss.  GOAL:   Patient will meet greater than or equal to 90% of their needs  MONITOR:   PO intake, Supplement acceptance, Labs, I & O's  REASON FOR ASSESSMENT:   Other (Comment) (Low BMI)    ASSESSMENT:   Bethany [name] is a 82 y.o. female with medical history significant of c diff colitis, hypertension, hyperlipidemia, GERD, hypothyroidism, depression, sciatica, migraine headache, PE on Xarelto, skin cancer, rectal cancer (s/p of surgery and radiation therapy, currently on chemotherapy), s/p of colostomy, who presents with diarrhea and abdominal pain.  Spoke with [name] at bedside. She endorses good appetite at home prior to onset of illness, but also endorses 10# wt loss in 2 weeks. Per chart she exhibits a 26#/21% severe wt loss in 1 month. Pt states she was feeling so bad at home with C-Diff flareups that she "couldn't get much food in her." She is feeling a little better today.  Nutrition-Focused physical exam completed. Findings are severe fat depletion, severe muscle depletion, and no edema.   Per chart, her documented PO intake has varied, but is sitting between the 60-100% range. She states she has a good appetite at this point.  Labs and Medications reviewed: Folic Acid, Multivitamin, B12 D5 @ 7mL/hr --> 306 calories  Diet Order:  Diet regular Room service appropriate?: Yes; Fluid consistency:: Thin  Skin:  Reviewed, no issues  Last BM:  6/28  Height:   Ht Readings from Last 1 Encounters:  08/02/15 5\' 7"  (1.702 m)    Weight:   Wt Readings from Last 1 Encounters:  08/02/15 97  lb (44 kg)    Ideal Body Weight:  61.36 kg  BMI:  Body mass index is 15.19 kg/(m^2).  Estimated Nutritional Needs:   Kcal:  1300-1500 (30-35 cal/kg)  Protein:  45-60 grams  Fluid:  >/= 1.3L  EDUCATION NEEDS:   No education needs identified at this time  Bethany [name]. [name], MS, RD LDN Inpatient Clinical Dietitian Pager [phone]

## 2015-08-08 DIAGNOSIS — Z933 Colostomy status: Secondary | ICD-10-CM

## 2015-08-08 LAB — BASIC METABOLIC PANEL
ANION GAP: 5 (ref 5–15)
BUN: 8 mg/dL (ref 6–20)
CALCIUM: 8.5 mg/dL — AB (ref 8.9–10.3)
CHLORIDE: 110 mmol/L (ref 101–111)
CO2: 23 mmol/L (ref 22–32)
Creatinine, Ser: 0.41 mg/dL — ABNORMAL LOW (ref 0.44–1.00)
GFR calc non Af Amer: >60 mL/min (ref >60)
Glucose, Bld: 104 mg/dL — ABNORMAL HIGH (ref 65–99)
POTASSIUM: 3.9 mmol/L (ref 3.5–5.1)
Sodium: 138 mmol/L (ref 135–145)

## 2015-08-08 LAB — MAGNESIUM: Magnesium: 1.4 mg/dL — ABNORMAL LOW (ref 1.7–2.4)

## 2015-08-08 MED ORDER — DIPHENOXYLATE-ATROPINE 2.5-0.025 MG PO TABS: 2.0000 | ORAL_TABLET | Freq: Four times a day (QID) | ORAL | Status: DC

## 2015-08-08 MED ORDER — SODIUM CHLORIDE 0.9 % IV SOLN
INTRAVENOUS | Status: DC
  Administered 2015-08-08 – 2015-08-09 (×2): via INTRAVENOUS

## 2015-08-08 MED ORDER — MAGNESIUM SULFATE 4 GM/100ML IV SOLN
4.0000 g | Freq: Once | INTRAVENOUS | Status: AC
  Administered 2015-08-08: 4 g via INTRAVENOUS
  Filled 2015-08-08: qty 100

## 2015-08-08 MED ORDER — OCTREOTIDE ACETATE 100 MCG/ML IJ SOLN
100.0000 ug | Freq: Three times a day (TID) | INTRAMUSCULAR | Status: DC
  Administered 2015-08-08 – 2015-08-10 (×8): 100 ug via SUBCUTANEOUS
  Filled 2015-08-08 (×10): qty 1

## 2015-08-08 NOTE — Progress Notes (Signed)
PROGRESS NOTE                                                                                                                                                                                                             Patient Demographics:    Bethany [name], is a 82 y.o. female, DOB - [birth year], [id number]  Admit date - 07/31/2015   Admitting Physician Ivor Costa, MD  Outpatient Primary MD for the patient is [name], MD  LOS - 8  Outpatient Specialists: none  Chief Complaint  Patient presents with  . c diff        Brief Narrative   82 year old female with history of rectal adenocarcinoma (T3N1) s/p chemo/XRT 9-11/2013, s/p LAR 05/2014, fibrothecoma, PE, and HTN admitted for persistent diarrhea secondary to C. Diff. In the past she had a C. Diff infection while she was being treated for her rectal cancer. Her diarrhea started earlier in May and her initial stool study was negative for C. Diff, but she was treated emperically with metronidazole x 14 days with an excellent response. One week after stopping the antibiotics she had a recurrence of her diarrhea and the stool study during this second episode was positive for C. Diff. She did not respond to the second round of treatment with metronidazole and she was subsequently started on vancomycin 125 mg QID. , after 5-6 days of treatment she did not have a drop in her bowel movements.   Subjective:   Till has significant colostomy output   Assessment  & Plan :    Principal Problem:  C. difficile colitis Persistent C. difficile diarrhea being treated with slow vancomycin taper. Still has high Colostomy output (2765mL), not improved with Lomotil. Discussed with ID and GI and discontinued Lomotil. . As per ID vancomycin taper regimen will be, 500 mg orally twice daily for 7 days, then 125 mg orally twice daily for 7 days, then 125 mg orally once daily for 7 days,  then 125 mg orally every other day for 7 days, then 125 mg orally every 3 days for 14 days.  Active Problems:  High colostomy output Not improved with Lomotil. Discontinued. Ordered stool sodium and potassium after discussing with Dr. Benson Norway.  Add IV hydration.    Pulmonary embolism (HCC)  stable. Continue xarelto.    Gastroesophageal reflux disease Off  pepcid.    essential Hypertension   Stable  Hypokalemia/ hypomagnesemia Replenished aggressively.    Hypothyroidism continue synthroid  Protein-calorie malnutrition, moderate (HCC) Continue supplement    Ovarian mass, right, s/p robotic excision 05/17/2014    Rectal cancer s/p LAResectin/colostomy 05/17/2014 Noted for liver metastases. Follows with Dr. Alen Blew. xeloda on hold while hospitalized. As per oncologist note from May, considering liver directed therapy if progression noted on repeat imaging.         Code Status : DO NOT RESUSCITATE  Family Communication  : None at bedside  Disposition Plan  : Home with home health once colostomy output improves.  Barriers For Discharge : High colostomy output  Consults  :   ID GI (Dr. Benson Norway)  Procedures  :  CT abdomen and pelvis  DVT Prophylaxis  :  Xarelto  Lab Results  Component Value Date   PLT 139* 08/02/2015    Antibiotics  :  Anti-infectives    Start     Dose/Rate Route Frequency Ordered Stop   08/06/15 2200  vancomycin (VANCOCIN) 50 mg/mL oral solution 500 mg     500 mg Oral Every 12 hours 08/06/15 1703     08/03/15 1400  metroNIDAZOLE (FLAGYL) tablet 500 mg  Status:  Discontinued     500 mg Oral Every 8 hours 08/03/15 1358 08/05/15 1604   07/31/15 1800  vancomycin (VANCOCIN) 50 mg/mL oral solution 500 mg  Status:  Discontinued     500 mg Oral Every 6 hours 07/31/15 1504 08/06/15 1703   07/31/15 1600  metroNIDAZOLE (FLAGYL) IVPB 500 mg  Status:  Discontinued     500 mg 100 mL/hr over 60 Minutes Intravenous Every 8 hours 07/31/15 1504 08/03/15 1358    07/31/15 1330  vancomycin (VANCOCIN) 50 mg/mL oral solution 125 mg     125 mg Oral  Once 07/31/15 1315 07/31/15 1441        Objective:   Filed Vitals:   08/07/15 0425 08/07/15 1500 08/07/15 2102 08/08/15 0540  BP: 120/62 126/57 125/50 146/60  Pulse: 80 77 70 63  Temp: 98.1 F (36.7 C) 97.8 F (36.6 C) 98.3 F (36.8 C) 97.6 F (36.4 C)  TempSrc: Oral Oral Oral Oral  Resp: 18 16 20 18   Height:      Weight:      SpO2: 98% 100% 100% 100%    Wt Readings from Last 3 Encounters:  08/02/15 44 kg (97 lb)  07/04/15 56.201 kg (123 lb 14.4 oz)  06/05/15 57.698 kg (127 lb 3.2 oz)     Intake/Output Summary (Last 24 hours) at 08/08/15 1458 Last data filed at 08/08/15 1252  Gross per 24 hour  Intake    240 ml  Output   3300 ml  Net  -3060 ml     Physical Exam  Gen: Elderly thin built female appears frail, not in distress HEENT: no pallor, Dry mucosa, supple neck Chest: clear b/l, no added sounds CVS: S1 and S2 irregular,, no murmurs,  GI: soft, NT, ND, BS+, colostomy bag  Musculoskeletal: warm, no edema     Data Review:    CBC  Recent Labs Lab 08/02/15 0513  WBC 4.4  HGB 10.8*  HCT 31.1*  PLT 139*  MCV 105.8*  MCH 36.7*  MCHC 34.7  RDW 16.6*    Chemistries   Recent Labs Lab 08/02/15 0513 08/03/15 1044 08/04/15 0538 08/05/15 0510 08/06/15 0536 08/07/15 0506 08/08/15 0519  NA 141  --  142 139  --  136 138  K 2.9*  --  2.6* 2.6*  --  3.5 3.9  CL 116*  --  114* 112*  --  109 110  CO2 21*  --  23 22  --  24 23  GLUCOSE 94  --  96 102*  --  104* 104*  BUN 9  --  7 6  --  7 8  CREATININE 0.40*  --  0.53 0.50  --  0.34* 0.41*  CALCIUM 8.1*  --  8.5* 8.6*  --  8.3* 8.5*  MG  --  1.1*  --  1.4* 1.6* 1.7 1.4*   ------------------------------------------------------------------------------------------------------------------ No results for input(s): CHOL, HDL, LDLCALC, TRIG, CHOLHDL, LDLDIRECT in the last 72 hours.  Lab Results  Component Value Date    HGBA1C 5.2 05/09/2014   ------------------------------------------------------------------------------------------------------------------ No results for input(s): TSH, T4TOTAL, T3FREE, THYROIDAB in the last 72 hours.  Invalid input(s): FREET3 ------------------------------------------------------------------------------------------------------------------ No results for input(s): VITAMINB12, FOLATE, FERRITIN, TIBC, IRON, RETICCTPCT in the last 72 hours.  Coagulation profile No results for input(s): INR, PROTIME in the last 168 hours.  No results for input(s): DDIMER in the last 72 hours.  Cardiac Enzymes No results for input(s): CKMB, TROPONINI, MYOGLOBIN in the last 168 hours.  Invalid input(s): CK ------------------------------------------------------------------------------------------------------------------ No results found for: BNP  Inpatient Medications  Scheduled Meds: . cholecalciferol  2,000 Units Oral Daily  . feeding supplement (ENSURE ENLIVE)  237 mL Oral TID BM  . folic acid  1 mg Oral Daily  . gabapentin  100 mg Oral TID  . levothyroxine  25 mcg Oral Once per day on Sun Mon Wed Fri  . levothyroxine  75 mcg Oral Once per day on Tue Thu Sat  . multivitamin  15 mL Oral Daily  . rivaroxaban  20 mg Oral Q supper  . saccharomyces boulardii  250 mg Oral BID  . vancomycin  500 mg Oral Q12H  . vitamin B-12  2,000 mcg Oral Daily   Continuous Infusions: . sodium chloride 100 mL/hr at 08/08/15 1423  . dextrose 5 % and 0.45 % NaCl with KCl 40 mEq/L 75 mL/hr at 08/07/15 1848   PRN Meds:.acetaminophen, clobetasol cream, lactose free nutrition, ondansetron **OR** ondansetron (ZOFRAN) IV, PEDIALYTE  Micro Results Recent Results (from the past 240 hour(s))  Culture, blood (x 2)     Status: None   Collection Time: 07/31/15  4:10 PM  Result Value Ref Range Status   Specimen Description BLOOD RIGHT WRIST  Final   Special Requests BOTTLES DRAWN AEROBIC ONLY 5 CC  Final    Culture   Final    NO GROWTH 5 DAYS Performed at [facility]    Report Status 08/05/2015 FINAL  Final  Culture, blood (x 2)     Status: None   Collection Time: 07/31/15  4:15 PM  Result Value Ref Range Status   Specimen Description BLOOD RIGHT HAND  Final   Special Requests BOTTLES DRAWN AEROBIC AND ANAEROBIC 5 CC  Final   Culture   Final    NO GROWTH 5 DAYS Performed at [facility]    Report Status 08/05/2015 FINAL  Final  Gastrointestinal Panel by PCR , Stool     Status: None   Collection Time: 07/31/15 10:30 PM  Result Value Ref Range Status   Campylobacter species NOT DETECTED NOT DETECTED Final   Plesimonas shigelloides NOT DETECTED NOT DETECTED Final   Salmonella species NOT DETECTED NOT DETECTED Final   Yersinia enterocolitica NOT DETECTED NOT  DETECTED Final   Vibrio species NOT DETECTED NOT DETECTED Final   Vibrio cholerae NOT DETECTED NOT DETECTED Final   Enteroaggregative E coli (EAEC) NOT DETECTED NOT DETECTED Final   Enteropathogenic E coli (EPEC) NOT DETECTED NOT DETECTED Final   Enterotoxigenic E coli (ETEC) NOT DETECTED NOT DETECTED Final   Shiga like toxin producing E coli (STEC) NOT DETECTED NOT DETECTED Final   E. coli O157 NOT DETECTED NOT DETECTED Final   Shigella/Enteroinvasive E coli (EIEC) NOT DETECTED NOT DETECTED Final   Cryptosporidium NOT DETECTED NOT DETECTED Final   Cyclospora cayetanensis NOT DETECTED NOT DETECTED Final   Entamoeba histolytica NOT DETECTED NOT DETECTED Final   Giardia lamblia NOT DETECTED NOT DETECTED Final   Adenovirus F40/41 NOT DETECTED NOT DETECTED Final   Astrovirus NOT DETECTED NOT DETECTED Final   Norovirus GI/GII NOT DETECTED NOT DETECTED Final   Rotavirus A NOT DETECTED NOT DETECTED Final   Sapovirus (I, II, IV, and V) NOT DETECTED NOT DETECTED Final  C difficile quick scan w PCR reflex     Status: None   Collection Time: 07/31/15 10:30 PM  Result Value Ref Range Status   C Diff antigen NEGATIVE NEGATIVE  Final   C Diff toxin NEGATIVE NEGATIVE Final   C Diff interpretation Negative for toxigenic C. difficile  Final    Radiology Reports Ct Abdomen Pelvis W Contrast  08/06/2015  CLINICAL DATA:  82 year old female with rectal adenocarcinoma status post low anterior resection 05/17/2014 and radiation therapy, with liver and lung metastases, admitted with diarrhea and high colostomy output. Patient has been receiving therapy for C diff colitis. EXAM: CT ABDOMEN AND PELVIS WITH CONTRAST TECHNIQUE: Multidetector CT imaging of the abdomen and pelvis was performed using the standard protocol following bolus administration of intravenous contrast. CONTRAST:  67mL ISOVUE-300 IOPAMIDOL (ISOVUE-300) INJECTION 61% COMPARISON:  05/01/2015 PET-CT.  04/04/2014 CT abdomen/pelvis. FINDINGS: Lower chest: New trace layering bilateral pleural effusions. At least 6 pulmonary nodules scattered throughout both lung bases have increased in size since 05/01/2015. For example a medial right lower lobe 1.7 x 1.3 cm pulmonary nodule (series 5/ image 3) increased from 1.2 x 0.9 cm and a left lower lobe 1.3 x 1.0 cm pulmonary nodule (series 5/ image 3), increased from 0.8 x 0.7 cm. Hepatobiliary: There are 5 liver masses demonstrating heterogeneous hypo enhancement, which have increased in size since 05/01/2015 with representative masses as follows: - segment 2 left liver lobe 5.3 x 4.8 cm mass (series 2/image 17), increased from 3.8 x 3.4 cm - segment 8 right liver lobe 4.3 x 3.3 cm mass (series 2/image 17), increased from 3.0 x 2.4 cm - segment 7/8 right liver lobe 7.4 x 4.6 cm mass (series 2/image 17), increased from 5.6 x 4.6 cm Normal gallbladder with no radiopaque cholelithiasis. No biliary ductal dilatation. Pancreas: Normal, with no mass or duct dilation. Spleen: Normal size. No mass. Adrenals/Urinary Tract: Normal adrenals. No hydronephrosis. Simple parapelvic renal cysts in both renal sinuses. Subcentimeter hypodense renal cortical  lesions in both kidneys are too small to characterize. Normal bladder. Stomach/Bowel: Grossly normal stomach. Normal caliber small bowel with no small bowel wall thickening. Appendix not discretely visualized. Status post low anterior resection with left end-colostomy. No large bowel wall thickening or pericolonic fat stranding. Oral contrast progresses to the end colostomy. No evidence of an entero-colonic fistula. Vascular/Lymphatic: Atherosclerotic nonaneurysmal abdominal aorta. Patent portal, splenic, hepatic and renal veins. No pathologically enlarged lymph nodes in the abdomen or pelvis. Reproductive: Status post hysterectomy,  with no abnormal findings at the vaginal cuff. No adnexal mass. Other: No pneumoperitoneum. Stable trace free fluid in the deep pelvis. No focal fluid collection. Musculoskeletal: No aggressive appearing focal osseous lesions. Diffuse moth-eaten appearance of the bones. Moderate degenerative changes in the visualized thoracolumbar spine. IMPRESSION: 1. Interval progression of liver and pulmonary metastases. 2. Diffuse moth-eaten appearance of the bones, a nonspecific finding that could represent diffuse osteopenia or an infiltrative marrow process. 3. New trace layering bilateral pleural effusions. Stable trace free fluid in the deep pelvis. No focal fluid collections in the abdomen or pelvis. 4. No evidence of bowel obstruction or acute bowel inflammation. No evidence of an entero-colonic fistula. Electronically Signed   By: Ilona Sorrel M.D.   On: 08/06/2015 13:17    Time Spent in minutes  25   Louellen Molder M.D on 08/08/2015 at 2:58 PM  Between 7am to 7pm - Pager - [phone]  After 7pm go to [email or web address] - password [facility]  [facility] -  Office  [phone]

## 2015-08-08 NOTE — Progress Notes (Signed)
Subjective: Stooling has increased.  She reports that in the afternoons the bowel movements increase.  Objective: Vital signs in last 24 hours: Temp:  [97.6 F (36.4 C)-98.3 F (36.8 C)] 97.6 F (36.4 C) (06/29 0540) Pulse Rate:  [63-70] 63 (06/29 0540) Resp:  [18-20] 18 (06/29 0540) BP: (125-146)/(50-60) 146/60 mmHg (06/29 0540) SpO2:  [100 %] 100 % (06/29 0540) Last BM Date: 08/08/15  Intake/Output from previous day: 06/28 0701 - 06/29 0700 In: 65 [P.O.:820] Out: 3400 [Urine:700; Stool:2700] Intake/Output this shift: Total I/O In: -  Out: 400 [Urine:200; Stool:200]  General appearance: alert and no distress GI: soft, non-tender; bowel sounds normal; no masses,  no organomegaly  Lab Results: No results for input(s): WBC, HGB, HCT, PLT in the last 72 hours. BMET  Recent Labs  08/07/15 0506 08/08/15 0519  NA 136 138  K 3.5 3.9  CL 109 110  CO2 24 23  GLUCOSE 104* 104*  BUN 7 8  CREATININE 0.34* 0.41*  CALCIUM 8.3* 8.5*   LFT No results for input(s): PROT, ALBUMIN, AST, ALT, ALKPHOS, BILITOT, BILIDIR, IBILI in the last 72 hours. PT/INR No results for input(s): LABPROT, INR in the last 72 hours. Hepatitis Panel No results for input(s): HEPBSAG, HCVAB, HEPAIGM, HEPBIGM in the last 72 hours. C-Diff No results for input(s): CDIFFTOX in the last 72 hours. Fecal Lactopherrin No results for input(s): FECLLACTOFRN in the last 72 hours.  Studies/Results: No results found.  Medications:  Scheduled: . cholecalciferol  2,000 Units Oral Daily  . feeding supplement (ENSURE ENLIVE)  237 mL Oral TID BM  . folic acid  1 mg Oral Daily  . gabapentin  100 mg Oral TID  . levothyroxine  25 mcg Oral Once per day on Sun Mon Wed Fri  . levothyroxine  75 mcg Oral Once per day on Tue Thu Sat  . magnesium sulfate 1 - 4 g bolus IVPB  4 g Intravenous Once  . multivitamin  15 mL Oral Daily  . octreotide  100 mcg Subcutaneous TID  . rivaroxaban  20 mg Oral Q supper  .  saccharomyces boulardii  250 mg Oral BID  . vancomycin  500 mg Oral Q12H  . vitamin B-12  2,000 mcg Oral Daily   Continuous: . sodium chloride 100 mL/hr at 08/08/15 1423  . dextrose 5 % and 0.45 % NaCl with KCl 40 mEq/L 75 mL/hr at 08/07/15 1848    Assessment/Plan: 1) Chronic diarrhea - ? Etiology. 2) Metastatic colon cancer.    Her stools have increased and it is not clear that this is an infectious source.  I will check stool sodium and potassium to discern if this is osmotic versus secretory diarrhea.  The test takes several days to obtain results.  In the meantime, I will initiate octreotide to treat for a secretory diarrhea.  If there is no improvement with octreotide, I will perform an FFS and obtain biopsies.  LOS: 8 days   Bethany [name] D 08/08/2015, 3:03 PM

## 2015-08-08 NOTE — Progress Notes (Signed)
OT Cancellation Note  Patient Details Name: Bethany [name] MRN: [id number] DOB: May 12, 1928   Cancelled Treatment:    Reason Eval/Treat Not Completed: Fatigue/lethargy limiting ability to participate.  Will check back tomorrow, if schedule permits.  Sahid Borba 08/08/2015, 2:30 PM  Lesle Chris, OTR/L [phone] 08/08/2015

## 2015-08-08 NOTE — Progress Notes (Signed)
Patient ID: Bethany [name], female   DOB: 1928/11/21, 82 y.o.   MRN: [id number]         [facility] for Infectious Disease    Date of Admission:  07/31/2015           Day 17 oral vancomycin  Principal Problem:   Diarrhea Active Problems:   C. difficile colitis   Pulmonary embolism (HCC)   Gastroesophageal reflux disease   Hypertension   Ovarian mass, right, s/p robotic excision 05/17/2014   Rectal cancer s/p LAResectin/colostomy 05/17/2014   Hypothyroidism   Protein-calorie malnutrition, moderate (HCC)   Anxiety and depression   Lumbar radicular pain   Rectal cancer metastasized to liver ([place])   . cholecalciferol  2,000 Units Oral Daily  . feeding supplement (ENSURE ENLIVE)  237 mL Oral TID BM  . folic acid  1 mg Oral Daily  . gabapentin  100 mg Oral TID  . levothyroxine  25 mcg Oral Once per day on Sun Mon Wed Fri  . levothyroxine  75 mcg Oral Once per day on Tue Thu Sat  . magnesium sulfate 1 - 4 g bolus IVPB  4 g Intravenous Once  . multivitamin  15 mL Oral Daily  . octreotide  100 mcg Subcutaneous TID  . rivaroxaban  20 mg Oral Q supper  . saccharomyces boulardii  250 mg Oral BID  . vancomycin  500 mg Oral Q12H  . vitamin B-12  2,000 mcg Oral Daily    SUBJECTIVE:   Review of Systems: Review of Systems  Constitutional: Positive for weight loss and malaise/fatigue. Negative for fever, chills and diaphoresis.  Gastrointestinal: Positive for diarrhea. Negative for heartburn, nausea, vomiting and abdominal pain.  Neurological: Positive for weakness and headaches.  Psychiatric/Behavioral: The patient is nervous/anxious.     Past Medical History  Diagnosis Date  . Migraine   . GERD (gastroesophageal reflux disease)   . Hypercholesteremia   . Pulmonary embolism ([place]) 01/01/2012  / 11/28/2013  . Hypothyroidism   . Rectal mass   . Ovarian tumor     sees dr Lisbeth Renshaw ob-gyn yearly for evaluation  . Cancer ([place]) 09/29/13    rectal adenocarcinoma  . Rectal cancer ([place])  09/29/13    invasive adeocarcinoma  . Hx of radiation therapy 10/25/13-11/28/13    pelvis, 4500 cGy in 25 sessions  . Incontinence of urine   . Hypertension     stopped med during chemo /radiation - BP has been stable  . Numbness     "I have no feeling in my left leg" unknown cause  . Arthritis   . History of skin cancer     removed from forehead  . Rosacea   . Rectal cancer ([place])   . Frequent bowel movements   . Hx: UTI (urinary tract infection)   . Depression   . Anxiety   . Severe sepsis with septic shock ([place]) 11/28/2013  . UTI (lower urinary tract infection) 01/23/2014  . Acute encephalopathy 12/08/2013  . Clostridium difficile colitis 12/08/2013  . Cor pulmonale, acute ([place]) 01/02/2012  . Fall from standing 01/22/2014  . Hypokalemia 11/28/2013  . 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 Smoking status: Never Smoker   . Smokeless tobacco: Never Used  . Alcohol Use: No    Family History  Problem Relation Age of Onset  . Brain cancer Mother   . Heart attack Father   . Diabetes Sister   . Heart disease Sister  Allergies  Allergen Reactions  . Cataflam [Diclofenac] Other (See Comments)    Unknown; patient is unaware of allergy.  . Cephalosporins Shortness Of Breath  . Erythromycin Shortness Of Breath  . Keflex [Cephalexin] Shortness Of Breath  . Propoxyphene     Unknown rxn  . Codeine Nausea And Vomiting  . Naproxen Other (See Comments)    Unknown; patient is unaware of allergy  . Penicillins Other (See Comments)    Patient states that she CAN take this; unaware of allergy, pt/family is unable to answer penicillin related questions as they do not recall this allergy reaction at all  . Synalgos-Dc [Aspirin-Caff-Dihydrocodeine] Other (See Comments)    Unknown; patient is unaware of allergy  . Ultram [Tramadol] Other (See Comments)    Unknown; patient is unaware of allergy    OBJECTIVE: Filed Vitals:   08/07/15 1500  08/07/15 2102 08/08/15 0540 08/08/15 1609  BP: 126/57 125/50 146/60 112/49  Pulse: 77 70 63 75  Temp: 97.8 F (36.6 C) 98.3 F (36.8 C) 97.6 F (36.4 C) 98.8 F (37.1 C)  TempSrc: Oral Oral Oral Oral  Resp: 16 20 18 18   Height:      Weight:      SpO2: 100% 100% 100% 100%   Body mass index is 15.19 kg/(m^2).  Physical Exam  Constitutional: She is oriented to person, place, and time.  She is more worried and anxious today. Her son is at the bedside.  Cardiovascular: Normal rate and regular rhythm.   No murmur heard. Pulmonary/Chest: Effort normal and breath sounds normal.  Abdominal: Soft. Bowel sounds are normal. There is no tenderness.  Her colostomy bag is half full with liquid, brown stool.  Neurological: She is alert and oriented to person, place, and time.  Skin: No rash noted.  Psychiatric: Affect normal.   Her colostomy output has increased dramatically again. She had 2700 mL out yesterday and 650 mL recorded so far today.  Lab Results Lab Results  Component Value Date   WBC 4.4 08/02/2015   HGB 10.8* 08/02/2015   HCT 31.1* 08/02/2015   MCV 105.8* 08/02/2015   PLT 139* 08/02/2015    Lab Results  Component Value Date   CREATININE 0.41* 08/08/2015   BUN 8 08/08/2015   NA 138 08/08/2015   K 3.9 08/08/2015   CL 110 08/08/2015   CO2 23 08/08/2015    Lab Results  Component Value Date   ALT 21 07/04/2015   AST 26 07/04/2015   ALKPHOS 86 07/04/2015   BILITOT 1.03 07/04/2015     Microbiology: Recent Results (from the past 240 hour(s))  Culture, blood (x 2)     Status: None   Collection Time: 07/31/15  4:10 PM  Result Value Ref Range Status   Specimen Description BLOOD RIGHT WRIST  Final   Special Requests BOTTLES DRAWN AEROBIC ONLY 5 CC  Final   Culture   Final    NO GROWTH 5 DAYS Performed at [facility]    Report Status 08/05/2015 FINAL  Final  Culture, blood (x 2)     Status: None   Collection Time: 07/31/15  4:15 PM  Result Value Ref  Range Status   Specimen Description BLOOD RIGHT HAND  Final   Special Requests BOTTLES DRAWN AEROBIC AND ANAEROBIC 5 CC  Final   Culture   Final    NO GROWTH 5 DAYS Performed at [facility]    Report Status 08/05/2015 FINAL  Final  Gastrointestinal Panel by  PCR , Stool     Status: None   Collection Time: 07/31/15 10:30 PM  Result Value Ref Range Status   Campylobacter species NOT DETECTED NOT DETECTED Final   Plesimonas shigelloides NOT DETECTED NOT DETECTED Final   Salmonella species NOT DETECTED NOT DETECTED Final   Yersinia enterocolitica NOT DETECTED NOT DETECTED Final   Vibrio species NOT DETECTED NOT DETECTED Final   Vibrio cholerae NOT DETECTED NOT DETECTED Final   Enteroaggregative E coli (EAEC) NOT DETECTED NOT DETECTED Final   Enteropathogenic E coli (EPEC) NOT DETECTED NOT DETECTED Final   Enterotoxigenic E coli (ETEC) NOT DETECTED NOT DETECTED Final   Shiga like toxin producing E coli (STEC) NOT DETECTED NOT DETECTED Final   E. coli O157 NOT DETECTED NOT DETECTED Final   Shigella/Enteroinvasive E coli (EIEC) NOT DETECTED NOT DETECTED Final   Cryptosporidium NOT DETECTED NOT DETECTED Final   Cyclospora cayetanensis NOT DETECTED NOT DETECTED Final   Entamoeba histolytica NOT DETECTED NOT DETECTED Final   Giardia lamblia NOT DETECTED NOT DETECTED Final   Adenovirus F40/41 NOT DETECTED NOT DETECTED Final   Astrovirus NOT DETECTED NOT DETECTED Final   Norovirus GI/GII NOT DETECTED NOT DETECTED Final   Rotavirus A NOT DETECTED NOT DETECTED Final   Sapovirus (I, II, IV, and V) NOT DETECTED NOT DETECTED Final  C difficile quick scan w PCR reflex     Status: None   Collection Time: 07/31/15 10:30 PM  Result Value Ref Range Status   C Diff antigen NEGATIVE NEGATIVE Final   C Diff toxin NEGATIVE NEGATIVE Final   C Diff interpretation Negative for toxigenic C. difficile  Final   ASSESSMENT: Her diarrhea decreased dramatically for 48 hours after she was started on  Lomotil recently but then picked up again yesterday.  I am concerned that Lomotil may have led to transient improvement then actually exacerbated her persistent C. Difficile infection. Lomotil has now been stopped. I plan on continuing her slow vancomycin taper. Since it is not entirely clear that C. Difficile is the sole cause of her diarrhea I agree with Dr. Ulyses Amor plan to look for evidence of any secretory diarrhea and to consider possible endoscopy and biopsies.  PLAN: 1. Continue vancomycin taper  Michel Bickers, MD [facility] for Infectious [place] [phone] pager   [phone] cell 08/08/2015, 4:45 PM

## 2015-08-08 NOTE — Progress Notes (Signed)
Physical Therapy Treatment Patient Details Name: Bethany [name] MRN: [id number] DOB: [birth year] Today's Date: 08/08/2015    History of Present Illness 82 yo female admitted with C-diff.     PT Comments    Assisted OOB to bathroom then amb in hallway.  Pt progressing.  C/O feeling weak. Plans to D/C to home.  Follow Up Recommendations  Home health PT;Supervision - Intermittent     Equipment Recommendations  None recommended by PT    Recommendations for Other Services       Precautions / Restrictions Precautions Precautions: Fall Restrictions Weight Bearing Restrictions: No    Mobility  Bed Mobility Overal bed mobility: Needs Assistance Bed Mobility: Supine to Sit;Sit to Supine     Supine to sit: Supervision Sit to supine: Supervision   General bed mobility comments: increased time  Transfers Overall transfer level: Needs assistance Equipment used: Rolling walker (2 wheeled) Transfers: Sit to/from Stand Sit to Stand: Supervision         General transfer comment: increased time and assist to bathroom  Ambulation/Gait Ambulation/Gait assistance: Supervision Ambulation Distance (Feet): 92 Feet Assistive device: Rolling walker (2 wheeled) Gait Pattern/deviations: Step-through pattern;Decreased stride length;Trunk flexed Gait velocity: decreased   General Gait Details: tolareated increased distance.  Good use of walker.  Knowledgable about her own abilities.     Stairs            Wheelchair Mobility    Modified Rankin (Stroke Patients Only)       Balance                                    Cognition Arousal/Alertness: Awake/alert Behavior During Therapy: WFL for tasks assessed/performed;Anxious Overall Cognitive Status: Within Functional Limits for tasks assessed                      Exercises      General Comments        Pertinent Vitals/Pain Pain Assessment: No/denies pain    Home Living                      Prior Function            PT Goals (current goals can now be found in the care plan section) Progress towards PT goals: Progressing toward goals    Frequency  Min 3X/week    PT Plan Current plan remains appropriate    Co-evaluation             End of Session Equipment Utilized During Treatment: Gait belt Activity Tolerance: Patient tolerated treatment well Patient left: in bed;with call bell/phone within reach;with bed alarm set     Time: [id number] PT Time Calculation (min) (ACUTE ONLY): 18 min  Charges:  $Gait Training: 8-22 mins                    G Codes:      Rica Koyanagi  PTA [facility]  Acute  Rehab Pager      [phone]

## 2015-08-09 ENCOUNTER — Other Ambulatory Visit: Payer: Medicare Other

## 2015-08-09 ENCOUNTER — Ambulatory Visit: Payer: Medicare Other | Admitting: Oncology

## 2015-08-09 LAB — BASIC METABOLIC PANEL
ANION GAP: 5 (ref 5–15)
BUN: 8 mg/dL (ref 6–20)
CALCIUM: 8.5 mg/dL — AB (ref 8.9–10.3)
CO2: 24 mmol/L (ref 22–32)
Chloride: 108 mmol/L (ref 101–111)
Creatinine, Ser: 0.39 mg/dL — ABNORMAL LOW (ref 0.44–1.00)
Glucose, Bld: 110 mg/dL — ABNORMAL HIGH (ref 65–99)
POTASSIUM: 4.1 mmol/L (ref 3.5–5.1)
Sodium: 137 mmol/L (ref 135–145)

## 2015-08-09 LAB — MAGNESIUM: MAGNESIUM: 1.9 mg/dL (ref 1.7–2.4)

## 2015-08-09 MED ORDER — VANCOMYCIN 50 MG/ML ORAL SOLUTION: 125.0000 mg | ORAL | Status: DC

## 2015-08-09 MED ORDER — VANCOMYCIN 50 MG/ML ORAL SOLUTION: 125.0000 mg | Freq: Every day | ORAL | Status: DC

## 2015-08-09 MED ORDER — SODIUM CHLORIDE 0.9 % IV SOLN
INTRAVENOUS | Status: AC
  Administered 2015-08-09 (×2): via INTRAVENOUS
  Administered 2015-08-10: 1000 mL via INTRAVENOUS

## 2015-08-09 MED ORDER — VANCOMYCIN 50 MG/ML ORAL SOLUTION: 125.0000 mg | Freq: Two times a day (BID) | ORAL | Status: DC

## 2015-08-09 MED ORDER — VANCOMYCIN 50 MG/ML ORAL SOLUTION
500.0000 mg | Freq: Two times a day (BID) | ORAL | Status: DC
  Administered 2015-08-09 – 2015-08-12 (×5): 500 mg via ORAL
  Filled 2015-08-09 (×6): qty 10

## 2015-08-09 NOTE — Progress Notes (Signed)
Subjective: Sleeping soundly.  Objective: Vital signs in last 24 hours: Temp:  [97.8 F (36.6 C)-98.8 F (37.1 C)] 97.8 F (36.6 C) (06/30 0529) Pulse Rate:  [71-77] 77 (06/30 0529) Resp:  [16-18] 16 (06/30 0529) BP: (112-135)/(49-69) 135/69 mmHg (06/30 0529) SpO2:  [99 %-100 %] 99 % (06/30 0529) Last BM Date: 08/08/15  Intake/Output from previous day: 06/29 0701 - 06/30 0700 In: 1831.7 [P.O.:720; I.V.:1011.7; IV Piggyback:100] Out: 3650 [Urine:400; Stool:3250] Intake/Output this shift: Total I/O In: 240 [P.O.:240] Out: 500 [Stool:500]  No performed as she is resting  Lab Results: No results for input(s): WBC, HGB, HCT, PLT in the last 72 hours. BMET  Recent Labs  08/07/15 0506 08/08/15 0519 08/09/15 0535  NA 136 138 137  K 3.5 3.9 4.1  CL 109 110 108  CO2 24 23 24   GLUCOSE 104* 104* 110*  BUN 7 8 8   CREATININE 0.34* 0.41* 0.39*  CALCIUM 8.3* 8.5* 8.5*   LFT No results for input(s): PROT, ALBUMIN, AST, ALT, ALKPHOS, BILITOT, BILIDIR, IBILI in the last 72 hours. PT/INR No results for input(s): LABPROT, INR in the last 72 hours. Hepatitis Panel No results for input(s): HEPBSAG, HCVAB, HEPAIGM, HEPBIGM in the last 72 hours. C-Diff No results for input(s): CDIFFTOX in the last 72 hours. Fecal Lactopherrin No results for input(s): FECLLACTOFRN in the last 72 hours.  Studies/Results: No results found.  Medications:  Scheduled: . cholecalciferol  2,000 Units Oral Daily  . feeding supplement (ENSURE ENLIVE)  237 mL Oral TID BM  . folic acid  1 mg Oral Daily  . gabapentin  100 mg Oral TID  . levothyroxine  25 mcg Oral Once per day on Sun Mon Wed Fri  . levothyroxine  75 mcg Oral Once per day on Tue Thu Sat  . multivitamin  15 mL Oral Daily  . octreotide  100 mcg Subcutaneous TID  . rivaroxaban  20 mg Oral Q supper  . saccharomyces boulardii  250 mg Oral BID  . vancomycin  500 mg Oral Q12H   Followed by  . [START ON 08/13/2015] vancomycin  125 mg Oral Q12H    Followed by  . [START ON 08/20/2015] vancomycin  125 mg Oral Daily   Followed by  . [START ON 08/27/2015] vancomycin  125 mg Oral Q48H   Followed by  . [START ON 09/04/2015] vancomycin  125 mg Oral Q72H  . vitamin B-12  2,000 mcg Oral Daily   Continuous: . sodium chloride 125 mL/hr at 08/09/15 1027  . dextrose 5 % and 0.45 % NaCl with KCl 40 mEq/L 75 mL/hr at 08/08/15 1420    Assessment/Plan: 1) Chronic diarrhea - Still with a significant amount of bowel movements yesterday.    Plan: 1) FFS tomorrow with biopsies.  The procedure will be performed in an unprepped colon.   LOS: 9 days   Bethany [name] 08/09/2015, 2:24 PM

## 2015-08-09 NOTE — Progress Notes (Signed)
Patient ID: Bethany [name], female   DOB: 09-02-1928, 82 y.o.   MRN: [id number]         [facility] for Infectious Disease    Date of Admission:  07/31/2015           Day 18 oral vancomycin  Principal Problem:   Diarrhea Active Problems:   C. difficile colitis   Pulmonary embolism (HCC)   Gastroesophageal reflux disease   Hypertension   Ovarian mass, right, s/p robotic excision 05/17/2014   Rectal cancer s/p LAResectin/colostomy 05/17/2014   Hypothyroidism   Protein-calorie malnutrition, moderate (HCC)   Anxiety and depression   Lumbar radicular pain   Rectal cancer metastasized to liver ([place])   . cholecalciferol  2,000 Units Oral Daily  . feeding supplement (ENSURE ENLIVE)  237 mL Oral TID BM  . folic acid  1 mg Oral Daily  . gabapentin  100 mg Oral TID  . levothyroxine  25 mcg Oral Once per day on Sun Mon Wed Fri  . levothyroxine  75 mcg Oral Once per day on Tue Thu Sat  . multivitamin  15 mL Oral Daily  . octreotide  100 mcg Subcutaneous TID  . rivaroxaban  20 mg Oral Q supper  . saccharomyces boulardii  250 mg Oral BID  . vancomycin  500 mg Oral Q12H  . vitamin B-12  2,000 mcg Oral Daily    SUBJECTIVE: She denies any abdominal pain, nausea or vomiting.  Review of Systems: Review of Systems  Constitutional: Positive for weight loss and malaise/fatigue. Negative for fever, chills and diaphoresis.  Gastrointestinal: Positive for diarrhea. Negative for heartburn, nausea, vomiting and abdominal pain.  Neurological: Positive for weakness and headaches.  Psychiatric/Behavioral: The patient is nervous/anxious.     Past Medical History  Diagnosis Date  . Migraine   . GERD (gastroesophageal reflux disease)   . Hypercholesteremia   . Pulmonary embolism ([place]) 01/01/2012  / 11/28/2013  . Hypothyroidism   . Rectal mass   . Ovarian tumor     sees dr Lisbeth Renshaw ob-gyn yearly for evaluation  . Cancer ([place]) 09/29/13    rectal adenocarcinoma  . Rectal cancer ([place]) 09/29/13   invasive adeocarcinoma  . Hx of radiation therapy 10/25/13-11/28/13    pelvis, 4500 cGy in 25 sessions  . Incontinence of urine   . Hypertension     stopped med during chemo /radiation - BP has been stable  . Numbness     "I have no feeling in my left leg" unknown cause  . Arthritis   . History of skin cancer     removed from forehead  . Rosacea   . Rectal cancer ([place])   . Frequent bowel movements   . Hx: UTI (urinary tract infection)   . Depression   . Anxiety   . Severe sepsis with septic shock ([place]) 11/28/2013  . UTI (lower urinary tract infection) 01/23/2014  . Acute encephalopathy 12/08/2013  . Clostridium difficile colitis 12/08/2013  . Cor pulmonale, acute ([place]) 01/02/2012  . Fall from standing 01/22/2014  . Hypokalemia 11/28/2013  . 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 Smoking status: Never Smoker   . Smokeless tobacco: Never Used  . Alcohol Use: No    Family History  Problem Relation Age of Onset  . Brain cancer Mother   . Heart attack Father   . Diabetes Sister   . Heart disease Sister    Allergies  Allergen Reactions  . Cataflam [  Diclofenac] Other (See Comments)    Unknown; patient is unaware of allergy.  . Cephalosporins Shortness Of Breath  . Erythromycin Shortness Of Breath  . Keflex [Cephalexin] Shortness Of Breath  . Propoxyphene     Unknown rxn  . Codeine Nausea And Vomiting  . Naproxen Other (See Comments)    Unknown; patient is unaware of allergy  . Penicillins Other (See Comments)    Patient states that she CAN take this; unaware of allergy, pt/family is unable to answer penicillin related questions as they do not recall this allergy reaction at all  . Synalgos-Dc [Aspirin-Caff-Dihydrocodeine] Other (See Comments)    Unknown; patient is unaware of allergy  . Ultram [Tramadol] Other (See Comments)    Unknown; patient is unaware of allergy    OBJECTIVE: Filed Vitals:   08/08/15 0540 08/08/15 1609  08/08/15 2207 08/09/15 0529  BP: 146/60 112/49 122/53 135/69  Pulse: 63 75 71 77  Temp: 97.6 F (36.4 C) 98.8 F (37.1 C) 97.9 F (36.6 C) 97.8 F (36.6 C)  TempSrc: Oral Oral Oral Oral  Resp: 18 18 16 16   Height:      Weight:      SpO2: 100% 100% 100% 99%   Body mass index is 15.19 kg/(m^2).  Physical Exam  Constitutional: She is oriented to person, place, and time.  She is anxious.  Cardiovascular: Normal rate and regular rhythm.   No murmur heard. Pulmonary/Chest: Effort normal and breath sounds normal.  Abdominal: Soft. Bowel sounds are normal. There is no tenderness.  Neurological: She is alert and oriented to person, place, and time.  Skin: No rash noted.  Psychiatric: Affect normal.   Her colostomy output was 3250 mL yesterday.  Lab Results Lab Results  Component Value Date   WBC 4.4 08/02/2015   HGB 10.8* 08/02/2015   HCT 31.1* 08/02/2015   MCV 105.8* 08/02/2015   PLT 139* 08/02/2015    Lab Results  Component Value Date   CREATININE 0.39* 08/09/2015   BUN 8 08/09/2015   NA 137 08/09/2015   K 4.1 08/09/2015   CL 108 08/09/2015   CO2 24 08/09/2015    Lab Results  Component Value Date   ALT 21 07/04/2015   AST 26 07/04/2015   ALKPHOS 86 07/04/2015   BILITOT 1.03 07/04/2015     Microbiology: Recent Results (from the past 240 hour(s))  Culture, blood (x 2)     Status: None   Collection Time: 07/31/15  4:10 PM  Result Value Ref Range Status   Specimen Description BLOOD RIGHT WRIST  Final   Special Requests BOTTLES DRAWN AEROBIC ONLY 5 CC  Final   Culture   Final    NO GROWTH 5 DAYS Performed at [facility]    Report Status 08/05/2015 FINAL  Final  Culture, blood (x 2)     Status: None   Collection Time: 07/31/15  4:15 PM  Result Value Ref Range Status   Specimen Description BLOOD RIGHT HAND  Final   Special Requests BOTTLES DRAWN AEROBIC AND ANAEROBIC 5 CC  Final   Culture   Final    NO GROWTH 5 DAYS Performed at [facility]    Report Status 08/05/2015 FINAL  Final  Gastrointestinal Panel by PCR , Stool     Status: None   Collection Time: 07/31/15 10:30 PM  Result Value Ref Range Status   Campylobacter species NOT DETECTED NOT DETECTED Final   Plesimonas shigelloides NOT DETECTED NOT DETECTED Final  Salmonella species NOT DETECTED NOT DETECTED Final   Yersinia enterocolitica NOT DETECTED NOT DETECTED Final   Vibrio species NOT DETECTED NOT DETECTED Final   Vibrio cholerae NOT DETECTED NOT DETECTED Final   Enteroaggregative E coli (EAEC) NOT DETECTED NOT DETECTED Final   Enteropathogenic E coli (EPEC) NOT DETECTED NOT DETECTED Final   Enterotoxigenic E coli (ETEC) NOT DETECTED NOT DETECTED Final   Shiga like toxin producing E coli (STEC) NOT DETECTED NOT DETECTED Final   E. coli O157 NOT DETECTED NOT DETECTED Final   Shigella/Enteroinvasive E coli (EIEC) NOT DETECTED NOT DETECTED Final   Cryptosporidium NOT DETECTED NOT DETECTED Final   Cyclospora cayetanensis NOT DETECTED NOT DETECTED Final   Entamoeba histolytica NOT DETECTED NOT DETECTED Final   Giardia lamblia NOT DETECTED NOT DETECTED Final   Adenovirus F40/41 NOT DETECTED NOT DETECTED Final   Astrovirus NOT DETECTED NOT DETECTED Final   Norovirus GI/GII NOT DETECTED NOT DETECTED Final   Rotavirus A NOT DETECTED NOT DETECTED Final   Sapovirus (I, II, IV, and V) NOT DETECTED NOT DETECTED Final  C difficile quick scan w PCR reflex     Status: None   Collection Time: 07/31/15 10:30 PM  Result Value Ref Range Status   C Diff antigen NEGATIVE NEGATIVE Final   C Diff toxin NEGATIVE NEGATIVE Final   C Diff interpretation Negative for toxigenic C. difficile  Final   ASSESSMENT: She continues to have profuse watery diarrhea. One outpatient C. difficile toxin screen was positive at the end of May so I must assume that she could have persistent C. difficile infection. I will continue a slow vancomycin taper but agree for further evaluation of other  possible causes. Stool electrolytes studies are pending.  PLAN: 1. Continue vancomycin taper 2. Please call Dr. Talbot Grumbling [phone]) for any infectious disease questions this weekend    Michel Bickers, MD [facility] for [place] [phone] pager   [phone] cell 08/09/2015, 11:28 AM

## 2015-08-09 NOTE — Progress Notes (Signed)
PROGRESS NOTE                                                                                                                                                                                                             Patient Demographics:    Bethany [name], is a 82 y.o. female, DOB - [birth year], [id number]  Admit date - 07/31/2015   Admitting Physician Ivor Costa, MD  Outpatient Primary MD for the patient is [name], MD  LOS - 9  Outpatient Specialists: none  Chief Complaint  Patient presents with  . c diff        Brief Narrative   82 year old female with history of rectal adenocarcinoma (T3N1) s/p chemo/XRT 9-11/2013, s/p LAR 05/2014, fibrothecoma, PE, and HTN admitted for persistent diarrhea secondary to C. Diff. In the past she had a C. Diff infection while she was being treated for her rectal cancer. Her diarrhea started earlier in May and her initial stool study was negative for C. Diff, but she was treated emperically with metronidazole x 14 days with an excellent response. One week after stopping the antibiotics she had a recurrence of her diarrhea and the stool study during this second episode was positive for C. Diff. She did not respond to the second round of treatment with metronidazole and she was subsequently started on vancomycin 125 mg QID. , after 5-6 days of treatment she did not have a drop in her bowel movements.   Subjective:   Still having significant output (increased from yesterday). Has a small a lot of blood mixed stool. Complains of having to go to bathroom to urinate frequently while on IV fluids.   Assessment  & Plan :    Principal Problem:  C. difficile colitis Persistent C. difficile diarrhea being treated with slow vancomycin taper. Still has high Colostomy output (2740mL), not improved with Lomotil. Discussed with ID and GI and discontinued Lomotil. . As per ID vancomycin taper regimen  will be, 500 mg orally twice daily for 7 days, then 125 mg orally twice daily for 7 days, then 125 mg orally once daily for 7 days, then 125 mg orally every other day for 7 days, then 125 mg orally every 3 days for 14 days.  Active Problems:  High colostomy output Not improved with Lomotil. Discontinued. Concerning for secretory diarrhea. Urine sodium  and potassium ordered. Started on IV octreotide by Dr. Benson Norway. Aggressive IV hydration.    Pulmonary embolism (HCC)  stable. Continue xarelto.    Gastroesophageal reflux disease Off  pepcid.    essential Hypertension   Stable  Hypokalemia/ hypomagnesemia Secondary daily to GI loss. Replenished aggressively.    Hypothyroidism continue synthroid  Protein-calorie malnutrition, moderate (HCC) Continue supplement    Ovarian mass, right, s/p robotic excision 05/17/2014    Rectal cancer s/p LAResectin/colostomy 05/17/2014 Noted for liver metastases. Follows with Dr. Alen Blew. xeloda on hold while hospitalized. As per oncologist note from May, considering liver directed therapy if progression noted on repeat imaging.         Code Status : DO NOT RESUSCITATE, I will involve palliative care if symptoms unimproved or worsen over the next 24-48 hours.  Family Communication  : None at bedside. Will call nephew to update.  Disposition Plan  : Home with home health once colostomy output improves.  Barriers For Discharge : High colostomy output  Consults  :   ID GI (Dr. Benson Norway)  Procedures  :  CT abdomen and pelvis  DVT Prophylaxis  :  Xarelto  Lab Results  Component Value Date   PLT 139* 08/02/2015    Antibiotics  :  Anti-infectives    Start     Dose/Rate Route Frequency Ordered Stop   09/04/15 1000  vancomycin (VANCOCIN) 50 mg/mL oral solution 125 mg     125 mg Oral every 72 hours 08/09/15 1133 09/19/15 0959   08/27/15 1000  vancomycin (VANCOCIN) 50 mg/mL oral solution 125 mg     125 mg Oral Every 48 hours 08/09/15 1133  09/04/15 0959   08/20/15 1000  vancomycin (VANCOCIN) 50 mg/mL oral solution 125 mg     125 mg Oral Daily 08/09/15 1133 08/27/15 0959   08/13/15 1000  vancomycin (VANCOCIN) 50 mg/mL oral solution 125 mg     125 mg Oral Every 12 hours 08/09/15 1133 08/20/15 0959   08/09/15 2200  vancomycin (VANCOCIN) 50 mg/mL oral solution 500 mg     500 mg Oral Every 12 hours 08/09/15 1133 08/13/15 0959   08/06/15 2200  vancomycin (VANCOCIN) 50 mg/mL oral solution 500 mg  Status:  Discontinued     500 mg Oral Every 12 hours 08/06/15 1703 08/09/15 1133   08/03/15 1400  metroNIDAZOLE (FLAGYL) tablet 500 mg  Status:  Discontinued     500 mg Oral Every 8 hours 08/03/15 1358 08/05/15 1604   07/31/15 1800  vancomycin (VANCOCIN) 50 mg/mL oral solution 500 mg  Status:  Discontinued     500 mg Oral Every 6 hours 07/31/15 1504 08/06/15 1703   07/31/15 1600  metroNIDAZOLE (FLAGYL) IVPB 500 mg  Status:  Discontinued     500 mg 100 mL/hr over 60 Minutes Intravenous Every 8 hours 07/31/15 1504 08/03/15 1358   07/31/15 1330  vancomycin (VANCOCIN) 50 mg/mL oral solution 125 mg     125 mg Oral  Once 07/31/15 1315 07/31/15 1441        Objective:   Filed Vitals:   08/08/15 0540 08/08/15 1609 08/08/15 2207 08/09/15 0529  BP: 146/60 112/49 122/53 135/69  Pulse: 63 75 71 77  Temp: 97.6 F (36.4 C) 98.8 F (37.1 C) 97.9 F (36.6 C) 97.8 F (36.6 C)  TempSrc: Oral Oral Oral Oral  Resp: 18 18 16 16   Height:      Weight:      SpO2: 100% 100% 100% 99%  Wt Readings from Last 3 Encounters:  08/02/15 44 kg (97 lb)  07/04/15 56.201 kg (123 lb 14.4 oz)  06/05/15 57.698 kg (127 lb 3.2 oz)     Intake/Output Summary (Last 24 hours) at 08/09/15 1406 Last data filed at 08/09/15 1219  Gross per 24 hour  Intake 1591.67 ml  Output   3750 ml  Net -2158.33 ml     Physical Exam  Gen: Elderly thin built female appears frail, not in distress HEENT: no pallor, Dry mucosa, supple neck Chest: clear b/l, no added  sounds CVS: S1 and S2 irregular,, no murmurs,  GI: soft, NT, ND, BS+, colostomy bag  Musculoskeletal: warm, no edema     Data Review:    CBC No results for input(s): WBC, HGB, HCT, PLT, MCV, MCH, MCHC, RDW, LYMPHSABS, MONOABS, EOSABS, BASOSABS, BANDABS in the last 168 hours.  Invalid input(s): NEUTRABS, BANDSABD  Chemistries   Recent Labs Lab 08/04/15 0538 08/05/15 0510 08/06/15 0536 08/07/15 0506 08/08/15 0519 08/09/15 0535  NA 142 139  --  136 138 137  K 2.6* 2.6*  --  3.5 3.9 4.1  CL 114* 112*  --  109 110 108  CO2 23 22  --  24 23 24   GLUCOSE 96 102*  --  104* 104* 110*  BUN 7 6  --  7 8 8   CREATININE 0.53 0.50  --  0.34* 0.41* 0.39*  CALCIUM 8.5* 8.6*  --  8.3* 8.5* 8.5*  MG  --  1.4* 1.6* 1.7 1.4* 1.9   ------------------------------------------------------------------------------------------------------------------ No results for input(s): CHOL, HDL, LDLCALC, TRIG, CHOLHDL, LDLDIRECT in the last 72 hours.  Lab Results  Component Value Date   HGBA1C 5.2 05/09/2014   ------------------------------------------------------------------------------------------------------------------ No results for input(s): TSH, T4TOTAL, T3FREE, THYROIDAB in the last 72 hours.  Invalid input(s): FREET3 ------------------------------------------------------------------------------------------------------------------ No results for input(s): VITAMINB12, FOLATE, FERRITIN, TIBC, IRON, RETICCTPCT in the last 72 hours.  Coagulation profile No results for input(s): INR, PROTIME in the last 168 hours.  No results for input(s): DDIMER in the last 72 hours.  Cardiac Enzymes No results for input(s): CKMB, TROPONINI, MYOGLOBIN in the last 168 hours.  Invalid input(s): CK ------------------------------------------------------------------------------------------------------------------ No results found for: BNP  Inpatient Medications  Scheduled Meds: . cholecalciferol  2,000 Units  Oral Daily  . feeding supplement (ENSURE ENLIVE)  237 mL Oral TID BM  . folic acid  1 mg Oral Daily  . gabapentin  100 mg Oral TID  . levothyroxine  25 mcg Oral Once per day on Sun Mon Wed Fri  . levothyroxine  75 mcg Oral Once per day on Tue Thu Sat  . multivitamin  15 mL Oral Daily  . octreotide  100 mcg Subcutaneous TID  . rivaroxaban  20 mg Oral Q supper  . saccharomyces boulardii  250 mg Oral BID  . vancomycin  500 mg Oral Q12H   Followed by  . [START ON 08/13/2015] vancomycin  125 mg Oral Q12H   Followed by  . [START ON 08/20/2015] vancomycin  125 mg Oral Daily   Followed by  . [START ON 08/27/2015] vancomycin  125 mg Oral Q48H   Followed by  . [START ON 09/04/2015] vancomycin  125 mg Oral Q72H  . vitamin B-12  2,000 mcg Oral Daily   Continuous Infusions: . sodium chloride 125 mL/hr at 08/09/15 1027  . dextrose 5 % and 0.45 % NaCl with KCl 40 mEq/L 75 mL/hr at 08/08/15 1420   PRN Meds:.acetaminophen, clobetasol cream, lactose free nutrition,  ondansetron **OR** ondansetron (ZOFRAN) IV, PEDIALYTE  Micro Results Recent Results (from the past 240 hour(s))  Culture, blood (x 2)     Status: None   Collection Time: 07/31/15  4:10 PM  Result Value Ref Range Status   Specimen Description BLOOD RIGHT WRIST  Final   Special Requests BOTTLES DRAWN AEROBIC ONLY 5 CC  Final   Culture   Final    NO GROWTH 5 DAYS Performed at [facility]    Report Status 08/05/2015 FINAL  Final  Culture, blood (x 2)     Status: None   Collection Time: 07/31/15  4:15 PM  Result Value Ref Range Status   Specimen Description BLOOD RIGHT HAND  Final   Special Requests BOTTLES DRAWN AEROBIC AND ANAEROBIC 5 CC  Final   Culture   Final    NO GROWTH 5 DAYS Performed at [facility]    Report Status 08/05/2015 FINAL  Final  Gastrointestinal Panel by PCR , Stool     Status: None   Collection Time: 07/31/15 10:30 PM  Result Value Ref Range Status   Campylobacter species NOT DETECTED NOT  DETECTED Final   Plesimonas shigelloides NOT DETECTED NOT DETECTED Final   Salmonella species NOT DETECTED NOT DETECTED Final   Yersinia enterocolitica NOT DETECTED NOT DETECTED Final   Vibrio species NOT DETECTED NOT DETECTED Final   Vibrio cholerae NOT DETECTED NOT DETECTED Final   Enteroaggregative E coli (EAEC) NOT DETECTED NOT DETECTED Final   Enteropathogenic E coli (EPEC) NOT DETECTED NOT DETECTED Final   Enterotoxigenic E coli (ETEC) NOT DETECTED NOT DETECTED Final   Shiga like toxin producing E coli (STEC) NOT DETECTED NOT DETECTED Final   E. coli O157 NOT DETECTED NOT DETECTED Final   Shigella/Enteroinvasive E coli (EIEC) NOT DETECTED NOT DETECTED Final   Cryptosporidium NOT DETECTED NOT DETECTED Final   Cyclospora cayetanensis NOT DETECTED NOT DETECTED Final   Entamoeba histolytica NOT DETECTED NOT DETECTED Final   Giardia lamblia NOT DETECTED NOT DETECTED Final   Adenovirus F40/41 NOT DETECTED NOT DETECTED Final   Astrovirus NOT DETECTED NOT DETECTED Final   Norovirus GI/GII NOT DETECTED NOT DETECTED Final   Rotavirus A NOT DETECTED NOT DETECTED Final   Sapovirus (I, II, IV, and V) NOT DETECTED NOT DETECTED Final  C difficile quick scan w PCR reflex     Status: None   Collection Time: 07/31/15 10:30 PM  Result Value Ref Range Status   C Diff antigen NEGATIVE NEGATIVE Final   C Diff toxin NEGATIVE NEGATIVE Final   C Diff interpretation Negative for toxigenic C. difficile  Final    Radiology Reports Ct Abdomen Pelvis W Contrast  08/06/2015  CLINICAL DATA:  82 year old female with rectal adenocarcinoma status post low anterior resection 05/17/2014 and radiation therapy, with liver and lung metastases, admitted with diarrhea and high colostomy output. Patient has been receiving therapy for C diff colitis. EXAM: CT ABDOMEN AND PELVIS WITH CONTRAST TECHNIQUE: Multidetector CT imaging of the abdomen and pelvis was performed using the standard protocol following bolus  administration of intravenous contrast. CONTRAST:  84mL ISOVUE-300 IOPAMIDOL (ISOVUE-300) INJECTION 61% COMPARISON:  05/01/2015 PET-CT.  04/04/2014 CT abdomen/pelvis. FINDINGS: Lower chest: New trace layering bilateral pleural effusions. At least 6 pulmonary nodules scattered throughout both lung bases have increased in size since 05/01/2015. For example a medial right lower lobe 1.7 x 1.3 cm pulmonary nodule (series 5/ image 3) increased from 1.2 x 0.9 cm and a left lower lobe 1.3  x 1.0 cm pulmonary nodule (series 5/ image 3), increased from 0.8 x 0.7 cm. Hepatobiliary: There are 5 liver masses demonstrating heterogeneous hypo enhancement, which have increased in size since 05/01/2015 with representative masses as follows: - segment 2 left liver lobe 5.3 x 4.8 cm mass (series 2/image 17), increased from 3.8 x 3.4 cm - segment 8 right liver lobe 4.3 x 3.3 cm mass (series 2/image 17), increased from 3.0 x 2.4 cm - segment 7/8 right liver lobe 7.4 x 4.6 cm mass (series 2/image 17), increased from 5.6 x 4.6 cm Normal gallbladder with no radiopaque cholelithiasis. No biliary ductal dilatation. Pancreas: Normal, with no mass or duct dilation. Spleen: Normal size. No mass. Adrenals/Urinary Tract: Normal adrenals. No hydronephrosis. Simple parapelvic renal cysts in both renal sinuses. Subcentimeter hypodense renal cortical lesions in both kidneys are too small to characterize. Normal bladder. Stomach/Bowel: Grossly normal stomach. Normal caliber small bowel with no small bowel wall thickening. Appendix not discretely visualized. Status post low anterior resection with left end-colostomy. No large bowel wall thickening or pericolonic fat stranding. Oral contrast progresses to the end colostomy. No evidence of an entero-colonic fistula. Vascular/Lymphatic: Atherosclerotic nonaneurysmal abdominal aorta. Patent portal, splenic, hepatic and renal veins. No pathologically enlarged lymph nodes in the abdomen or pelvis.  Reproductive: Status post hysterectomy, with no abnormal findings at the vaginal cuff. No adnexal mass. Other: No pneumoperitoneum. Stable trace free fluid in the deep pelvis. No focal fluid collection. Musculoskeletal: No aggressive appearing focal osseous lesions. Diffuse moth-eaten appearance of the bones. Moderate degenerative changes in the visualized thoracolumbar spine. IMPRESSION: 1. Interval progression of liver and pulmonary metastases. 2. Diffuse moth-eaten appearance of the bones, a nonspecific finding that could represent diffuse osteopenia or an infiltrative marrow process. 3. New trace layering bilateral pleural effusions. Stable trace free fluid in the deep pelvis. No focal fluid collections in the abdomen or pelvis. 4. No evidence of bowel obstruction or acute bowel inflammation. No evidence of an entero-colonic fistula. Electronically Signed   By: Ilona Sorrel M.D.   On: 08/06/2015 13:17    Time Spent in minutes  25   Louellen Molder M.D on 08/09/2015 at 2:06 PM  Between 7am to 7pm - Pager - [phone]  After 7pm go to [email or web address] - password [facility]  [facility] -  Office  [phone]

## 2015-08-09 NOTE — Progress Notes (Signed)
Occupational Therapy Treatment Patient Details Name: Bethany [name] MRN: [id number] DOB: Aug 02, 1928 Today's Date: 08/09/2015    History of present illness 82 yo female admitted with C-diff.    OT comments  Patient is progressing towards OT goals. Continue OT per plan of care.  Follow Up Recommendations  Home health OT;Supervision - Intermittent    Equipment Recommendations  None recommended by OT    Recommendations for Other Services      Precautions / Restrictions Precautions Precautions: Fall Restrictions Weight Bearing Restrictions: No       Mobility Bed Mobility Overal bed mobility: Modified Independent                Transfers Overall transfer level: Needs assistance Equipment used: Rolling walker (2 wheeled) Transfers: Sit to/from Stand Sit to Stand: Supervision         General transfer comment: for safety    Balance                                   ADL Overall ADL's : Needs assistance/impaired     Grooming: Oral care;Applying deodorant;Supervision/safety;Standing   Upper Body Bathing: Set up;Sitting   Lower Body Bathing: Minimal assistance;Sit to/from stand   Upper Body Dressing : Set up;Sitting   Lower Body Dressing: Minimal assistance;Sit to/from stand   Toilet Transfer: Supervision/safety;Ambulation;Regular Toilet;RW   Toileting- Clothing Manipulation and Hygiene: Supervision/safety;Sit to/from stand       Functional mobility during ADLs: Supervision/safety;Rolling walker General ADL Comments: Performed toilet transfer, toileting, sponge bath, change gown and brief, grooming in standing at sink during session.      Vision                     Perception     Praxis      Cognition   Behavior During Therapy: [facility] for tasks assessed/performed;Anxious Overall Cognitive Status: Within Functional Limits for tasks assessed                       Extremity/Trunk Assessment                Exercises     Shoulder Instructions       General Comments      Pertinent Vitals/ Pain       Pain Assessment: No/denies pain  Home Living                                          Prior Functioning/Environment              Frequency Min 2X/week     Progress Toward Goals  OT Goals(current goals can now be found in the care plan section)  Progress towards OT goals: Progressing toward goals  Acute Rehab OT Goals Patient Stated Goal: home ADL Goals Pt Will Perform Grooming: with modified independence;standing Pt Will Perform Upper Body Bathing: with modified independence;sitting Pt Will Perform Lower Body Bathing: with min assist;with adaptive equipment;sit to/from stand Pt Will Perform Upper Body Dressing: with modified independence;sitting Pt Will Perform Lower Body Dressing: with modified independence;with adaptive equipment;sit to/from stand Pt Will Transfer to Toilet: with modified independence;ambulating;regular height toilet Pt Will Perform Toileting - Clothing Manipulation and hygiene: with modified independence;sit to/from stand  Plan Discharge plan needs to be updated  Co-evaluation                 End of Session Equipment Utilized During Treatment: Rolling walker   Activity Tolerance Patient tolerated treatment well   Patient Left in bed;with call bell/phone within reach;with bed alarm set   Nurse Communication          Time: [phone] OT Time Calculation (min): 36 min  Charges: OT General Charges $OT Visit: 1 Procedure OT Treatments $Self Care/Home Management : 23-37 mins  Prisha Hiley A 08/09/2015, 10:14 AM

## 2015-08-10 ENCOUNTER — Encounter (HOSPITAL_COMMUNITY): Payer: Self-pay

## 2015-08-10 ENCOUNTER — Encounter (HOSPITAL_COMMUNITY)
Admission: EM | Disposition: A | Discharge status: Home Health | Payer: Self-pay | Source: Home / Self Care | Attending: Family Medicine

## 2015-08-10 DIAGNOSIS — K9409 Other complications of colostomy: Secondary | ICD-10-CM

## 2015-08-10 HISTORY — PX: FLEXIBLE SIGMOIDOSCOPY: SHX5431

## 2015-08-10 LAB — BASIC METABOLIC PANEL
ANION GAP: 5 (ref 5–15)
BUN: 7 mg/dL (ref 6–20)
CALCIUM: 8.4 mg/dL — AB (ref 8.9–10.3)
CO2: 23 mmol/L (ref 22–32)
CREATININE: 0.34 mg/dL — AB (ref 0.44–1.00)
Chloride: 109 mmol/L (ref 101–111)
Glucose, Bld: 104 mg/dL — ABNORMAL HIGH (ref 65–99)
Potassium: 3.8 mmol/L (ref 3.5–5.1)
SODIUM: 137 mmol/L (ref 135–145)

## 2015-08-10 LAB — MAGNESIUM: Magnesium: 1.4 mg/dL — ABNORMAL LOW (ref 1.7–2.4)

## 2015-08-10 SURGERY — SIGMOIDOSCOPY, FLEXIBLE
Anesthesia: Moderate Sedation

## 2015-08-10 MED ORDER — MAGNESIUM SULFATE 2 GM/50ML IV SOLN
2.0000 g | Freq: Once | INTRAVENOUS | Status: AC
  Administered 2015-08-10: 2 g via INTRAVENOUS
  Filled 2015-08-10: qty 50

## 2015-08-10 MED ORDER — SODIUM CHLORIDE 0.9 % IV SOLN: INTRAVENOUS | Status: DC

## 2015-08-10 NOTE — H&P (View-Only) (Signed)
Subjective: Sleeping soundly.  Objective: Vital signs in last 24 hours: Temp:  [97.8 F (36.6 C)-98.8 F (37.1 C)] 97.8 F (36.6 C) (06/30 0529) Pulse Rate:  [71-77] 77 (06/30 0529) Resp:  [16-18] 16 (06/30 0529) BP: (112-135)/(49-69) 135/69 mmHg (06/30 0529) SpO2:  [99 %-100 %] 99 % (06/30 0529) Last BM Date: 08/08/15  Intake/Output from previous day: 06/29 0701 - 06/30 0700 In: 1831.7 [P.O.:720; I.V.:1011.7; IV Piggyback:100] Out: 3650 [Urine:400; Stool:3250] Intake/Output this shift: Total I/O In: 240 [P.O.:240] Out: 500 [Stool:500]  No performed as she is resting  Lab Results: No results for input(s): WBC, HGB, HCT, PLT in the last 72 hours. BMET  Recent Labs  08/07/15 0506 08/08/15 0519 08/09/15 0535  NA 136 138 137  K 3.5 3.9 4.1  CL 109 110 108  CO2 24 23 24   GLUCOSE 104* 104* 110*  BUN 7 8 8   CREATININE 0.34* 0.41* 0.39*  CALCIUM 8.3* 8.5* 8.5*   LFT No results for input(s): PROT, ALBUMIN, AST, ALT, ALKPHOS, BILITOT, BILIDIR, IBILI in the last 72 hours. PT/INR No results for input(s): LABPROT, INR in the last 72 hours. Hepatitis Panel No results for input(s): HEPBSAG, HCVAB, HEPAIGM, HEPBIGM in the last 72 hours. C-Diff No results for input(s): CDIFFTOX in the last 72 hours. Fecal Lactopherrin No results for input(s): FECLLACTOFRN in the last 72 hours.  Studies/Results: No results found.  Medications:  Scheduled: . cholecalciferol  2,000 Units Oral Daily  . feeding supplement (ENSURE ENLIVE)  237 mL Oral TID BM  . folic acid  1 mg Oral Daily  . gabapentin  100 mg Oral TID  . levothyroxine  25 mcg Oral Once per day on Sun Mon Wed Fri  . levothyroxine  75 mcg Oral Once per day on Tue Thu Sat  . multivitamin  15 mL Oral Daily  . octreotide  100 mcg Subcutaneous TID  . rivaroxaban  20 mg Oral Q supper  . saccharomyces boulardii  250 mg Oral BID  . vancomycin  500 mg Oral Q12H   Followed by  . [START ON 08/13/2015] vancomycin  125 mg Oral Q12H    Followed by  . [START ON 08/20/2015] vancomycin  125 mg Oral Daily   Followed by  . [START ON 08/27/2015] vancomycin  125 mg Oral Q48H   Followed by  . [START ON 09/04/2015] vancomycin  125 mg Oral Q72H  . vitamin B-12  2,000 mcg Oral Daily   Continuous: . sodium chloride 125 mL/hr at 08/09/15 1027  . dextrose 5 % and 0.45 % NaCl with KCl 40 mEq/L 75 mL/hr at 08/08/15 1420    Assessment/Plan: 1) Chronic diarrhea - Still with a significant amount of bowel movements yesterday.    Plan: 1) FFS tomorrow with biopsies.  The procedure will be performed in an unprepped colon.   LOS: 9 days   Bethany [name] D 08/09/2015, 2:24 PM

## 2015-08-10 NOTE — Interval H&P Note (Signed)
History and Physical Interval Note:  08/10/2015 12:04 PM  Bethany [name]  has presented today for surgery, with the diagnosis of Diarrhea  The various methods of treatment have been discussed with the patient and family. After consideration of risks, benefits and other options for treatment, the patient has consented to  Procedure(s): FLEXIBLE SIGMOIDOSCOPY (N/A) as a surgical intervention .  The patient's history has been reviewed, patient examined, no change in status, stable for surgery.  I have reviewed the patient's chart and labs.  Questions were answered to the patient's satisfaction.     Laurelin Elson D

## 2015-08-10 NOTE — Op Note (Signed)
[facility] Patient Name: Bethany [name] Procedure Date: 08/10/2015 MRN: [id number] Attending MD: Carol Ada , MD Date of Birth: 04/26/1928 CSN: [id number] Age: 82 Admit Type: Inpatient Procedure:                Flexible Sigmoidoscopy Indications:              Diarrhea Providers:                Carol Ada, MD, Dortha Schwalbe RN, RN, Ralene Bathe, Technician Referring MD:              Medicines:                None Complications:            No immediate complications. Estimated Blood Loss:     Estimated blood loss was minimal. Procedure:                Pre-Anesthesia Assessment:                           - Prior to the procedure, a History and Physical                            was performed, and patient medications and                            allergies were reviewed. The patient's tolerance of                            previous anesthesia was also reviewed. The risks                            and benefits of the procedure and the sedation                            options and risks were discussed with the patient.                            All questions were answered, and informed consent                            was obtained. Prior Anticoagulants: The patient has                            taken Xarelto (rivaroxaban), last dose was 1 day                            prior to procedure. ASA Grade Assessment: III - A                            patient with severe systemic disease. After                            reviewing the  risks and benefits, the patient was                            deemed in satisfactory condition to undergo the                            procedure.                           After obtaining informed consent, the scope was                            passed under direct vision. The [id number]                            scope was introduced through the anus and advanced                            to the  the descending colon. The flexible                            sigmoidoscopy was accomplished without difficulty.                            The patient tolerated the procedure well. The                            quality of the bowel preparation was adequate. Scope In: Scope Out: Findings:      The entire examined colon appeared normal. Biopsies were taken with a       cold forceps for histology.      The pediatric colonoscope was advanced from the left sided colostomy up       30-40 cm. No overt evidence of inflammation, ulcerations, erosions, or       vascular abnormalities. The mucosa did appear granular. Six cold       biopsies were obtained randomly. Impression:               - The entire examined colon is normal. Biopsied. Moderate Sedation:      N/A- Per Anesthesia Care Recommendation:           - Return patient to hospital ward for ongoing care.                           - Resume previous diet.                           - Await pathology results. Procedure Code(s):        --- Professional ---                           [phone], Sigmoidoscopy, flexible; with biopsy, single                            or multiple Diagnosis Code(s):        --- Professional ---  R19.7, Diarrhea, unspecified CPT copyright 2016 [employer]. All rights reserved. The codes documented in this report are preliminary and upon coder review may  be revised to meet current compliance requirements. Carol Ada, MD Carol Ada, MD 08/10/2015 12:39:16 PM This report has been signed electronically. Number of Addenda: 0

## 2015-08-10 NOTE — Progress Notes (Signed)
PROGRESS NOTE                                                                                                                                                                                                             Patient Demographics:    Bethany [name], is a 82 y.o. female, DOB - 07-Apr-1928, [id number]  Admit date - 07/31/2015   Admitting Physician Ivor Costa, MD  Outpatient Primary MD for the patient is [name], MD  LOS - 10  Outpatient Specialists: none  Chief Complaint  Patient presents with  . c diff        Brief Narrative   82 year old female with history of rectal adenocarcinoma (T3N1) s/p chemo/XRT 9-11/2013, s/p LAR 05/2014, fibrothecoma, PE, and HTN admitted for persistent diarrhea secondary to C. Diff. In the past she had a C. Diff infection while she was being treated for her rectal cancer. Her diarrhea started earlier in May and her initial stool study was negative for C. Diff, but she was treated emperically with metronidazole x 14 days with an excellent response. One week after stopping the antibiotics she had a recurrence of her diarrhea and the stool study during this second episode was positive for C. Diff. She did not respond to the second round of treatment with metronidazole and she was subsequently started on vancomycin 125 mg QID. , after 5-6 days of treatment she did not have a drop in her bowel movements.   Subjective:   Stool output improved past 24 hours (1300 mL). No further blood in the colostomy bag. Patient complains of having to run to the bathroom frequently during the day due to being on saline.   Assessment  & Plan :    Principal Problem:  C. difficile colitis Persistent C. difficile diarrhea being treated with slow vancomycin taper. Colostomy output improving after IV octreotide initiated. Discontinued Lomotil.  As per ID vancomycin taper regimen will be, 500 mg orally twice  daily for 7 days, then 125 mg orally twice daily for 7 days, then 125 mg orally once daily for 7 days, then 125 mg orally every other day for 7 days, then 125 mg orally every 3 days for 14 days.  Active Problems:  High colostomy output Not improved with Lomotil and was discontinued. Concerning for secretory diarrhea. Urine sodium and potassium pending. Started  on IV octreotide by Dr. Benson Norway and has shown improvement in her stool output past 24 hours. Flexible sigmoidoscopy with biopsy through the colostomy scheduled today. Continue IV hydration. Instructed not to put a bedside commode for ease of having frequent urination.    Pulmonary embolism (HCC)  stable. Continue xarelto.    Gastroesophageal reflux disease Off  pepcid.    essential Hypertension   Stable  Hypokalemia/ hypomagnesemia Secondary daily to GI loss. Continue to replenish.   Hypothyroidism continue synthroid  Protein-calorie malnutrition, moderate (HCC) Continue supplement    Ovarian mass, right, s/p robotic excision 05/17/2014    Rectal cancer s/p LAResectin/colostomy 05/17/2014 Noted for liver metastases. Follows with Dr. Alen Blew. xeloda on hold while hospitalized. As per oncologist note from May, considering liver directed therapy if progression noted on repeat imaging.         Code Status : DO NOT RESUSCITATE, I will involve palliative care if symptoms unimproved or worsen    Family Communication  : Nephew at bedside  Disposition Plan  : Home with home health once colostomy output improves.  Barriers For Discharge : High colostomy output  Consults  :   ID GI (Dr. Benson Norway)  Procedures  :  CT abdomen and pelvis  DVT Prophylaxis  :  Xarelto  Lab Results  Component Value Date   PLT 139* 08/02/2015    Antibiotics  :  Anti-infectives    Start     Dose/Rate Route Frequency Ordered Stop   09/04/15 1000  [MAR Hold]  vancomycin (VANCOCIN) 50 mg/mL oral solution 125 mg     (MAR Hold since 08/10/15  1154)   125 mg Oral every 72 hours 08/09/15 1133 09/19/15 0959   08/27/15 1000  [MAR Hold]  vancomycin (VANCOCIN) 50 mg/mL oral solution 125 mg     (MAR Hold since 08/10/15 1154)   125 mg Oral Every 48 hours 08/09/15 1133 09/04/15 0959   08/20/15 1000  [MAR Hold]  vancomycin (VANCOCIN) 50 mg/mL oral solution 125 mg     (MAR Hold since 08/10/15 1154)   125 mg Oral Daily 08/09/15 1133 08/27/15 0959   08/13/15 1000  [MAR Hold]  vancomycin (VANCOCIN) 50 mg/mL oral solution 125 mg     (MAR Hold since 08/10/15 1154)   125 mg Oral Every 12 hours 08/09/15 1133 08/20/15 0959   08/09/15 2200  [MAR Hold]  vancomycin (VANCOCIN) 50 mg/mL oral solution 500 mg     (MAR Hold since 08/10/15 1154)   500 mg Oral Every 12 hours 08/09/15 1133 08/13/15 0959   08/06/15 2200  vancomycin (VANCOCIN) 50 mg/mL oral solution 500 mg  Status:  Discontinued     500 mg Oral Every 12 hours 08/06/15 1703 08/09/15 1133   08/03/15 1400  metroNIDAZOLE (FLAGYL) tablet 500 mg  Status:  Discontinued     500 mg Oral Every 8 hours 08/03/15 1358 08/05/15 1604   07/31/15 1800  vancomycin (VANCOCIN) 50 mg/mL oral solution 500 mg  Status:  Discontinued     500 mg Oral Every 6 hours 07/31/15 1504 08/06/15 1703   07/31/15 1600  metroNIDAZOLE (FLAGYL) IVPB 500 mg  Status:  Discontinued     500 mg 100 mL/hr over 60 Minutes Intravenous Every 8 hours 07/31/15 1504 08/03/15 1358   07/31/15 1330  vancomycin (VANCOCIN) 50 mg/mL oral solution 125 mg     125 mg Oral  Once 07/31/15 1315 07/31/15 1441        Objective:   Filed Vitals:  08/09/15 1430 08/09/15 2017 08/10/15 0520 08/10/15 0711  BP: 129/61 136/66 131/62   Pulse: 73 68 35 66  Temp: 97.6 F (36.4 C) 98 F (36.7 C) 97.4 F (36.3 C)   TempSrc: Oral Oral Oral   Resp: 16 16 18    Height:      Weight:      SpO2: 96% 99% 99%     Wt Readings from Last 3 Encounters:  08/02/15 44 kg (97 lb)  07/04/15 56.201 kg (123 lb 14.4 oz)  06/05/15 57.698 kg (127 lb 3.2 oz)      Intake/Output Summary (Last 24 hours) at 08/10/15 1225 Last data filed at 08/10/15 1025  Gross per 24 hour  Intake      0 ml  Output   1975 ml  Net  -1975 ml     Physical Exam  Gen: Elderly thin built female appears frail, not in distress HEENT: no pallor, Dry mucosa, supple neck Chest: clear b/l, no added sounds CVS: S1 and S2 irregular,, no murmurs,  GI: soft, NT, ND, BS+, colostomy bag  Musculoskeletal: warm, no edema     Data Review:    CBC No results for input(s): WBC, HGB, HCT, PLT, MCV, MCH, MCHC, RDW, LYMPHSABS, MONOABS, EOSABS, BASOSABS, BANDABS in the last 168 hours.  Invalid input(s): NEUTRABS, BANDSABD  Chemistries   Recent Labs Lab 08/05/15 0510 08/06/15 0536 08/07/15 0506 08/08/15 0519 08/09/15 0535 08/10/15 0538  NA 139  --  136 138 137 137  K 2.6*  --  3.5 3.9 4.1 3.8  CL 112*  --  109 110 108 109  CO2 22  --  24 23 24 23   GLUCOSE 102*  --  104* 104* 110* 104*  BUN 6  --  7 8 8 7   CREATININE 0.50  --  0.34* 0.41* 0.39* 0.34*  CALCIUM 8.6*  --  8.3* 8.5* 8.5* 8.4*  MG 1.4* 1.6* 1.7 1.4* 1.9 1.4*   ------------------------------------------------------------------------------------------------------------------ No results for input(s): CHOL, HDL, LDLCALC, TRIG, CHOLHDL, LDLDIRECT in the last 72 hours.  Lab Results  Component Value Date   HGBA1C 5.2 05/09/2014   ------------------------------------------------------------------------------------------------------------------ No results for input(s): TSH, T4TOTAL, T3FREE, THYROIDAB in the last 72 hours.  Invalid input(s): FREET3 ------------------------------------------------------------------------------------------------------------------ No results for input(s): VITAMINB12, FOLATE, FERRITIN, TIBC, IRON, RETICCTPCT in the last 72 hours.  Coagulation profile No results for input(s): INR, PROTIME in the last 168 hours.  No results for input(s): DDIMER in the last 72  hours.  Cardiac Enzymes No results for input(s): CKMB, TROPONINI, MYOGLOBIN in the last 168 hours.  Invalid input(s): CK ------------------------------------------------------------------------------------------------------------------ No results found for: BNP  Inpatient Medications  Scheduled Meds: . [MAR Hold] cholecalciferol  2,000 Units Oral Daily  . [MAR Hold] feeding supplement (ENSURE ENLIVE)  237 mL Oral TID BM  . [MAR Hold] folic acid  1 mg Oral Daily  . [MAR Hold] gabapentin  100 mg Oral TID  . [MAR Hold] levothyroxine  25 mcg Oral Once per day on Sun Mon Wed Fri  . [MAR Hold] levothyroxine  75 mcg Oral Once per day on Tue Thu Sat  . [MAR Hold] multivitamin  15 mL Oral Daily  . [MAR Hold] octreotide  100 mcg Subcutaneous TID  . [MAR Hold] rivaroxaban  20 mg Oral Q supper  . [MAR Hold] saccharomyces boulardii  250 mg Oral BID  . [MAR Hold] vancomycin  500 mg Oral Q12H   Followed by  . [MAR Hold] vancomycin  125 mg Oral Q12H  Followed by  . [MAR Hold] vancomycin  125 mg Oral Daily   Followed by  . [MAR Hold] vancomycin  125 mg Oral Q48H   Followed by  . [MAR Hold] vancomycin  125 mg Oral Q72H  . [MAR Hold] vitamin B-12  2,000 mcg Oral Daily   Continuous Infusions: . sodium chloride    . dextrose 5 % and 0.45 % NaCl with KCl 40 mEq/L 75 mL/hr at 08/10/15 1008   PRN Meds:.[MAR Hold] acetaminophen, [MAR Hold] clobetasol cream, [MAR Hold] lactose free nutrition, [MAR Hold] ondansetron **OR** [MAR Hold] ondansetron (ZOFRAN) IV, [MAR Hold] PEDIALYTE  Micro Results Recent Results (from the past 240 hour(s))  Culture, blood (x 2)     Status: None   Collection Time: 07/31/15  4:10 PM  Result Value Ref Range Status   Specimen Description BLOOD RIGHT WRIST  Final   Special Requests BOTTLES DRAWN AEROBIC ONLY 5 CC  Final   Culture   Final    NO GROWTH 5 DAYS Performed at [facility]    Report Status 08/05/2015 FINAL  Final  Culture, blood (x 2)     Status:  None   Collection Time: 07/31/15  4:15 PM  Result Value Ref Range Status   Specimen Description BLOOD RIGHT HAND  Final   Special Requests BOTTLES DRAWN AEROBIC AND ANAEROBIC 5 CC  Final   Culture   Final    NO GROWTH 5 DAYS Performed at [facility]    Report Status 08/05/2015 FINAL  Final  Gastrointestinal Panel by PCR , Stool     Status: None   Collection Time: 07/31/15 10:30 PM  Result Value Ref Range Status   Campylobacter species NOT DETECTED NOT DETECTED Final   Plesimonas shigelloides NOT DETECTED NOT DETECTED Final   Salmonella species NOT DETECTED NOT DETECTED Final   Yersinia enterocolitica NOT DETECTED NOT DETECTED Final   Vibrio species NOT DETECTED NOT DETECTED Final   Vibrio cholerae NOT DETECTED NOT DETECTED Final   Enteroaggregative E coli (EAEC) NOT DETECTED NOT DETECTED Final   Enteropathogenic E coli (EPEC) NOT DETECTED NOT DETECTED Final   Enterotoxigenic E coli (ETEC) NOT DETECTED NOT DETECTED Final   Shiga like toxin producing E coli (STEC) NOT DETECTED NOT DETECTED Final   E. coli O157 NOT DETECTED NOT DETECTED Final   Shigella/Enteroinvasive E coli (EIEC) NOT DETECTED NOT DETECTED Final   Cryptosporidium NOT DETECTED NOT DETECTED Final   Cyclospora cayetanensis NOT DETECTED NOT DETECTED Final   Entamoeba histolytica NOT DETECTED NOT DETECTED Final   Giardia lamblia NOT DETECTED NOT DETECTED Final   Adenovirus F40/41 NOT DETECTED NOT DETECTED Final   Astrovirus NOT DETECTED NOT DETECTED Final   Norovirus GI/GII NOT DETECTED NOT DETECTED Final   Rotavirus A NOT DETECTED NOT DETECTED Final   Sapovirus (I, II, IV, and V) NOT DETECTED NOT DETECTED Final  C difficile quick scan w PCR reflex     Status: None   Collection Time: 07/31/15 10:30 PM  Result Value Ref Range Status   C Diff antigen NEGATIVE NEGATIVE Final   C Diff toxin NEGATIVE NEGATIVE Final   C Diff interpretation Negative for toxigenic C. difficile  Final    Radiology Reports Ct  Abdomen Pelvis W Contrast  08/06/2015  CLINICAL DATA:  82 year old female with rectal adenocarcinoma status post low anterior resection 05/17/2014 and radiation therapy, with liver and lung metastases, admitted with diarrhea and high colostomy output. Patient has been receiving therapy for C diff  colitis. EXAM: CT ABDOMEN AND PELVIS WITH CONTRAST TECHNIQUE: Multidetector CT imaging of the abdomen and pelvis was performed using the standard protocol following bolus administration of intravenous contrast. CONTRAST:  26mL ISOVUE-300 IOPAMIDOL (ISOVUE-300) INJECTION 61% COMPARISON:  05/01/2015 PET-CT.  04/04/2014 CT abdomen/pelvis. FINDINGS: Lower chest: New trace layering bilateral pleural effusions. At least 6 pulmonary nodules scattered throughout both lung bases have increased in size since 05/01/2015. For example a medial right lower lobe 1.7 x 1.3 cm pulmonary nodule (series 5/ image 3) increased from 1.2 x 0.9 cm and a left lower lobe 1.3 x 1.0 cm pulmonary nodule (series 5/ image 3), increased from 0.8 x 0.7 cm. Hepatobiliary: There are 5 liver masses demonstrating heterogeneous hypo enhancement, which have increased in size since 05/01/2015 with representative masses as follows: - segment 2 left liver lobe 5.3 x 4.8 cm mass (series 2/image 17), increased from 3.8 x 3.4 cm - segment 8 right liver lobe 4.3 x 3.3 cm mass (series 2/image 17), increased from 3.0 x 2.4 cm - segment 7/8 right liver lobe 7.4 x 4.6 cm mass (series 2/image 17), increased from 5.6 x 4.6 cm Normal gallbladder with no radiopaque cholelithiasis. No biliary ductal dilatation. Pancreas: Normal, with no mass or duct dilation. Spleen: Normal size. No mass. Adrenals/Urinary Tract: Normal adrenals. No hydronephrosis. Simple parapelvic renal cysts in both renal sinuses. Subcentimeter hypodense renal cortical lesions in both kidneys are too small to characterize. Normal bladder. Stomach/Bowel: Grossly normal stomach. Normal caliber small bowel with  no small bowel wall thickening. Appendix not discretely visualized. Status post low anterior resection with left end-colostomy. No large bowel wall thickening or pericolonic fat stranding. Oral contrast progresses to the end colostomy. No evidence of an entero-colonic fistula. Vascular/Lymphatic: Atherosclerotic nonaneurysmal abdominal aorta. Patent portal, splenic, hepatic and renal veins. No pathologically enlarged lymph nodes in the abdomen or pelvis. Reproductive: Status post hysterectomy, with no abnormal findings at the vaginal cuff. No adnexal mass. Other: No pneumoperitoneum. Stable trace free fluid in the deep pelvis. No focal fluid collection. Musculoskeletal: No aggressive appearing focal osseous lesions. Diffuse moth-eaten appearance of the bones. Moderate degenerative changes in the visualized thoracolumbar spine. IMPRESSION: 1. Interval progression of liver and pulmonary metastases. 2. Diffuse moth-eaten appearance of the bones, a nonspecific finding that could represent diffuse osteopenia or an infiltrative marrow process. 3. New trace layering bilateral pleural effusions. Stable trace free fluid in the deep pelvis. No focal fluid collections in the abdomen or pelvis. 4. No evidence of bowel obstruction or acute bowel inflammation. No evidence of an entero-colonic fistula. Electronically Signed   By: Ilona Sorrel M.D.   On: 08/06/2015 13:17    Time Spent in minutes  25   Louellen Molder M.D on 08/10/2015 at 12:25 PM  Between 7am to 7pm - Pager - [phone]  After 7pm go to [email or web address] - password [facility]  [facility] -  Office  [phone]

## 2015-08-11 ENCOUNTER — Encounter (HOSPITAL_COMMUNITY): Payer: Self-pay | Admitting: Gastroenterology

## 2015-08-11 LAB — BASIC METABOLIC PANEL
Anion gap: 4 — ABNORMAL LOW (ref 5–15)
BUN: 6 mg/dL (ref 6–20)
CHLORIDE: 104 mmol/L (ref 101–111)
CO2: 28 mmol/L (ref 22–32)
CREATININE: 0.42 mg/dL — AB (ref 0.44–1.00)
Calcium: 8.6 mg/dL — ABNORMAL LOW (ref 8.9–10.3)
GFR calc Af Amer: >60 mL/min (ref >60)
GFR calc non Af Amer: >60 mL/min (ref >60)
Glucose, Bld: 126 mg/dL — ABNORMAL HIGH (ref 65–99)
POTASSIUM: 4.3 mmol/L (ref 3.5–5.1)
Sodium: 136 mmol/L (ref 135–145)

## 2015-08-11 LAB — POTASSIUM, STOOL: POTASSIUM STL: 39 mmol/L

## 2015-08-11 LAB — SODIUM, STOOL: Sodium, Stl: 72 mmol/L

## 2015-08-11 LAB — MAGNESIUM: Magnesium: 1.5 mg/dL — ABNORMAL LOW (ref 1.7–2.4)

## 2015-08-11 MED ORDER — OCTREOTIDE ACETATE 100 MCG/ML IJ SOLN
200.0000 ug | Freq: Three times a day (TID) | INTRAMUSCULAR | Status: DC
  Administered 2015-08-11 – 2015-08-12 (×5): 200 ug via SUBCUTANEOUS
  Filled 2015-08-11 (×6): qty 2

## 2015-08-11 MED ORDER — MAGNESIUM SULFATE 4 GM/100ML IV SOLN
4.0000 g | Freq: Once | INTRAVENOUS | Status: AC
  Administered 2015-08-11: 4 g via INTRAVENOUS
  Filled 2015-08-11: qty 100

## 2015-08-11 NOTE — Progress Notes (Signed)
Subjective: Urinating frequent at night.  Objective: Vital signs in last 24 hours: Temp:  [97.3 F (36.3 C)-97.9 F (36.6 C)] 97.8 F (36.6 C) (07/02 0451) Pulse Rate:  [66-111] 68 (07/02 0451) Resp:  [18-22] 20 (07/02 0451) BP: (124-185)/(58-72) 124/66 mmHg (07/02 0451) SpO2:  [98 %-100 %] 98 % (07/02 0451) Last BM Date: 08/11/15  Intake/Output from previous day: 07/01 0701 - 07/02 0700 In: 1525 [P.O.:600; I.V.:925] Out: 1050 [Urine:500; Stool:550] Intake/Output this shift: Total I/O In: 240 [P.O.:240] Out: -   General appearance: alert and no distress GI: soft, non-tender; bowel sounds normal; no masses,  no organomegaly  Lab Results: No results for input(s): WBC, HGB, HCT, PLT in the last 72 hours. BMET  Recent Labs  08/09/15 0535 08/10/15 0538 08/11/15 0524  NA 137 137 136  K 4.1 3.8 4.3  CL 108 109 104  CO2 24 23 28   GLUCOSE 110* 104* 126*  BUN 8 7 6   CREATININE 0.39* 0.34* 0.42*  CALCIUM 8.5* 8.4* 8.6*   LFT No results for input(s): PROT, ALBUMIN, AST, ALT, ALKPHOS, BILITOT, BILIDIR, IBILI in the last 72 hours. PT/INR No results for input(s): LABPROT, INR in the last 72 hours. Hepatitis Panel No results for input(s): HEPBSAG, HCVAB, HEPAIGM, HEPBIGM in the last 72 hours. C-Diff No results for input(s): CDIFFTOX in the last 72 hours. Fecal Lactopherrin No results for input(s): FECLLACTOFRN in the last 72 hours.  Studies/Results: No results found.  Medications:  Scheduled: . cholecalciferol  2,000 Units Oral Daily  . feeding supplement (ENSURE ENLIVE)  237 mL Oral TID BM  . folic acid  1 mg Oral Daily  . gabapentin  100 mg Oral TID  . levothyroxine  25 mcg Oral Once per day on Sun Mon Wed Fri  . levothyroxine  75 mcg Oral Once per day on Tue Thu Sat  . magnesium sulfate 1 - 4 g bolus IVPB  4 g Intravenous Once  . multivitamin  15 mL Oral Daily  . octreotide  200 mcg Subcutaneous TID  . rivaroxaban  20 mg Oral Q supper  . saccharomyces  boulardii  250 mg Oral BID  . vancomycin  500 mg Oral Q12H   Followed by  . [START ON 08/13/2015] vancomycin  125 mg Oral Q12H   Followed by  . [START ON 08/20/2015] vancomycin  125 mg Oral Daily   Followed by  . [START ON 08/27/2015] vancomycin  125 mg Oral Q48H   Followed by  . [START ON 09/04/2015] vancomycin  125 mg Oral Q72H  . vitamin B-12  2,000 mcg Oral Daily   Continuous: . dextrose 5 % and 0.45 % NaCl with KCl 40 mEq/L 75 mL/hr at 08/10/15 2246    Assessment/Plan: 1) Diarrhea - Only 150 ml over the night shift in her stool output.  She feels that she is improving in this area.  Hopefully she can be discharged home soon.  If she has good control of the stool output over the next 24 hours, I think she can be released to go home.   LOS: 11 days   Bethany [name] D 08/11/2015, 9:28 AM

## 2015-08-11 NOTE — Progress Notes (Signed)
PROGRESS NOTE                                                                                                                                                                                                             Patient Demographics:    Bethany [name], is a 82 y.o. female, DOB - [birth year], [id number]  Admit date - 07/31/2015   Admitting Physician Ivor Costa, MD  Outpatient Primary MD for the patient is [name], MD  LOS - 11  Outpatient Specialists: none  Chief Complaint  Patient presents with  . c diff        Brief Narrative   82 year old female with history of rectal adenocarcinoma (T3N1) s/p chemo/XRT 9-11/2013, s/p LAR 05/2014, fibrothecoma, PE, and HTN admitted for persistent diarrhea secondary to C. Diff. In the past she had a C. Diff infection while she was being treated for her rectal cancer. Her diarrhea started earlier in May and her initial stool study was negative for C. Diff, but she was treated emperically with metronidazole x 14 days with an excellent response. One week after stopping the antibiotics she had a recurrence of her diarrhea and the stool study during this second episode was positive for C. Diff. She did not respond to the second round of treatment with metronidazole and she was subsequently started on vancomycin 125 mg QID. , after 5-6 days of treatment she did not have a drop in her bowel movements.   Subjective:   Stool output Improving. Flexible sigmoidoscopy with biopsy sent on 7/1.   Assessment  & Plan :    Principal Problem:  C. difficile colitis Persistent C. difficile diarrhea being treated with slow vancomycin taper. Colostomy output improving after IV octreotide initiated. Discontinued Lomotil.  As per ID vancomycin taper regimen will be, 500 mg orally twice daily for 7 days, then 125 mg orally twice daily for 7 days, then 125 mg orally once daily for 7 days, then 125 mg  orally every other day for 7 days, then 125 mg orally every 3 days for 14 days.  Active Problems:  High colostomy output Lomotil  discontinued. Concerning for secretory diarrhea. Urine sodium and potassium normal. Started on IV octreotide by Dr. Benson Norway and has shown improvement in her stool output past 24 hours. Flexible sigmoidoscopy with biopsy through the colostomy.  bedside commode  for ease of having frequent urination.    Pulmonary embolism (HCC)  stable. Continue xarelto.    Gastroesophageal reflux disease Off  pepcid.    essential Hypertension   Stable  Hypokalemia/ hypomagnesemia Secondary to GI loss. Continue to replenish.   Hypothyroidism continue synthroid  Protein-calorie malnutrition, moderate (HCC) Continue supplement    Ovarian mass, right, s/p robotic excision 05/17/2014    Rectal cancer s/p LAResectin/colostomy 05/17/2014 Noted for liver metastases. Follows with Dr. Alen Blew. xeloda on hold while hospitalized. As per oncologist note from May, considering liver directed therapy if progression noted on repeat imaging.         Code Status : DO NOT RESUSCITATE,    Family Communication  : None  at bedside  Disposition Plan  : Home with home health once colostomy output improves., in 24-48 hrs  Barriers For Discharge : High colostomy output  Consults  :   ID GI (Dr. Benson Norway)  Procedures  :  CT abdomen and pelvis  DVT Prophylaxis  :  Xarelto  Lab Results  Component Value Date   PLT 139* 08/02/2015    Antibiotics  :  Anti-infectives    Start     Dose/Rate Route Frequency Ordered Stop   09/04/15 1000  vancomycin (VANCOCIN) 50 mg/mL oral solution 125 mg     125 mg Oral every 72 hours 08/09/15 1133 09/19/15 0959   08/27/15 1000  vancomycin (VANCOCIN) 50 mg/mL oral solution 125 mg     125 mg Oral Every 48 hours 08/09/15 1133 09/04/15 0959   08/20/15 1000  vancomycin (VANCOCIN) 50 mg/mL oral solution 125 mg     125 mg Oral Daily 08/09/15 1133 08/27/15  0959   08/13/15 1000  vancomycin (VANCOCIN) 50 mg/mL oral solution 125 mg     125 mg Oral Every 12 hours 08/09/15 1133 08/20/15 0959   08/09/15 2200  vancomycin (VANCOCIN) 50 mg/mL oral solution 500 mg     500 mg Oral Every 12 hours 08/09/15 1133 08/13/15 0959   08/06/15 2200  vancomycin (VANCOCIN) 50 mg/mL oral solution 500 mg  Status:  Discontinued     500 mg Oral Every 12 hours 08/06/15 1703 08/09/15 1133   08/03/15 1400  metroNIDAZOLE (FLAGYL) tablet 500 mg  Status:  Discontinued     500 mg Oral Every 8 hours 08/03/15 1358 08/05/15 1604   07/31/15 1800  vancomycin (VANCOCIN) 50 mg/mL oral solution 500 mg  Status:  Discontinued     500 mg Oral Every 6 hours 07/31/15 1504 08/06/15 1703   07/31/15 1600  metroNIDAZOLE (FLAGYL) IVPB 500 mg  Status:  Discontinued     500 mg 100 mL/hr over 60 Minutes Intravenous Every 8 hours 07/31/15 1504 08/03/15 1358   07/31/15 1330  vancomycin (VANCOCIN) 50 mg/mL oral solution 125 mg     125 mg Oral  Once 07/31/15 1315 07/31/15 1441        Objective:   Filed Vitals:   08/10/15 1235 08/10/15 1311 08/10/15 2041 08/11/15 0451  BP: 167/59 130/72 149/61 124/66  Pulse: 111 69 66 68  Temp:  97.3 F (36.3 C) 97.9 F (36.6 C) 97.8 F (36.6 C)  TempSrc:  Oral Oral Oral  Resp: 22 18 18 20   Height:      Weight:      SpO2: 100% 98% 100% 98%    Wt Readings from Last 3 Encounters:  08/02/15 44 kg (97 lb)  07/04/15 56.201 kg (123 lb 14.4 oz)  06/05/15 57.698 kg (127  lb 3.2 oz)     Intake/Output Summary (Last 24 hours) at 08/11/15 1128 Last data filed at 08/11/15 0800  Gross per 24 hour  Intake   1765 ml  Output    550 ml  Net   1215 ml     Physical Exam  Gen: Elderly thin built female appears frail, not in distress HEENT: no pallor, Dry mucosa, supple neck Chest: clear b/l, no added sounds CVS: S1 and S2 irregular,, no murmurs,  GI: soft, NT, ND, BS+, colostomy bag  Musculoskeletal: warm, no edema     Data Review:    CBC No results  for input(s): WBC, HGB, HCT, PLT, MCV, MCH, MCHC, RDW, LYMPHSABS, MONOABS, EOSABS, BASOSABS, BANDABS in the last 168 hours.  Invalid input(s): NEUTRABS, BANDSABD  Chemistries   Recent Labs Lab 08/07/15 0506 08/08/15 0519 08/09/15 0535 08/10/15 0538 08/11/15 0524  NA 136 138 137 137 136  K 3.5 3.9 4.1 3.8 4.3  CL 109 110 108 109 104  CO2 24 23 24 23 28   GLUCOSE 104* 104* 110* 104* 126*  BUN 7 8 8 7 6   CREATININE 0.34* 0.41* 0.39* 0.34* 0.42*  CALCIUM 8.3* 8.5* 8.5* 8.4* 8.6*  MG 1.7 1.4* 1.9 1.4* 1.5*   ------------------------------------------------------------------------------------------------------------------ No results for input(s): CHOL, HDL, LDLCALC, TRIG, CHOLHDL, LDLDIRECT in the last 72 hours.  Lab Results  Component Value Date   HGBA1C 5.2 05/09/2014   ------------------------------------------------------------------------------------------------------------------ No results for input(s): TSH, T4TOTAL, T3FREE, THYROIDAB in the last 72 hours.  Invalid input(s): FREET3 ------------------------------------------------------------------------------------------------------------------ No results for input(s): VITAMINB12, FOLATE, FERRITIN, TIBC, IRON, RETICCTPCT in the last 72 hours.  Coagulation profile No results for input(s): INR, PROTIME in the last 168 hours.  No results for input(s): DDIMER in the last 72 hours.  Cardiac Enzymes No results for input(s): CKMB, TROPONINI, MYOGLOBIN in the last 168 hours.  Invalid input(s): CK ------------------------------------------------------------------------------------------------------------------ No results found for: BNP  Inpatient Medications  Scheduled Meds: . cholecalciferol  2,000 Units Oral Daily  . feeding supplement (ENSURE ENLIVE)  237 mL Oral TID BM  . folic acid  1 mg Oral Daily  . gabapentin  100 mg Oral TID  . levothyroxine  25 mcg Oral Once per day on Sun Mon Wed Fri  . levothyroxine  75 mcg  Oral Once per day on Tue Thu Sat  . multivitamin  15 mL Oral Daily  . octreotide  200 mcg Subcutaneous TID  . rivaroxaban  20 mg Oral Q supper  . saccharomyces boulardii  250 mg Oral BID  . vancomycin  500 mg Oral Q12H   Followed by  . [START ON 08/13/2015] vancomycin  125 mg Oral Q12H   Followed by  . [START ON 08/20/2015] vancomycin  125 mg Oral Daily   Followed by  . [START ON 08/27/2015] vancomycin  125 mg Oral Q48H   Followed by  . [START ON 09/04/2015] vancomycin  125 mg Oral Q72H  . vitamin B-12  2,000 mcg Oral Daily   Continuous Infusions: . dextrose 5 % and 0.45 % NaCl with KCl 40 mEq/L 75 mL/hr at 08/10/15 2246   PRN Meds:.acetaminophen, clobetasol cream, lactose free nutrition, ondansetron **OR** ondansetron (ZOFRAN) IV, PEDIALYTE  Micro Results No results found for this or any previous visit (from the past 240 hour(s)).  Radiology Reports Ct Abdomen Pelvis W Contrast  08/06/2015  CLINICAL DATA:  82 year old female with rectal adenocarcinoma status post low anterior resection 05/17/2014 and radiation therapy, with liver and lung metastases, admitted with  diarrhea and high colostomy output. Patient has been receiving therapy for C diff colitis. EXAM: CT ABDOMEN AND PELVIS WITH CONTRAST TECHNIQUE: Multidetector CT imaging of the abdomen and pelvis was performed using the standard protocol following bolus administration of intravenous contrast. CONTRAST:  65mL ISOVUE-300 IOPAMIDOL (ISOVUE-300) INJECTION 61% COMPARISON:  05/01/2015 PET-CT.  04/04/2014 CT abdomen/pelvis. FINDINGS: Lower chest: New trace layering bilateral pleural effusions. At least 6 pulmonary nodules scattered throughout both lung bases have increased in size since 05/01/2015. For example a medial right lower lobe 1.7 x 1.3 cm pulmonary nodule (series 5/ image 3) increased from 1.2 x 0.9 cm and a left lower lobe 1.3 x 1.0 cm pulmonary nodule (series 5/ image 3), increased from 0.8 x 0.7 cm. Hepatobiliary: There are 5  liver masses demonstrating heterogeneous hypo enhancement, which have increased in size since 05/01/2015 with representative masses as follows: - segment 2 left liver lobe 5.3 x 4.8 cm mass (series 2/image 17), increased from 3.8 x 3.4 cm - segment 8 right liver lobe 4.3 x 3.3 cm mass (series 2/image 17), increased from 3.0 x 2.4 cm - segment 7/8 right liver lobe 7.4 x 4.6 cm mass (series 2/image 17), increased from 5.6 x 4.6 cm Normal gallbladder with no radiopaque cholelithiasis. No biliary ductal dilatation. Pancreas: Normal, with no mass or duct dilation. Spleen: Normal size. No mass. Adrenals/Urinary Tract: Normal adrenals. No hydronephrosis. Simple parapelvic renal cysts in both renal sinuses. Subcentimeter hypodense renal cortical lesions in both kidneys are too small to characterize. Normal bladder. Stomach/Bowel: Grossly normal stomach. Normal caliber small bowel with no small bowel wall thickening. Appendix not discretely visualized. Status post low anterior resection with left end-colostomy. No large bowel wall thickening or pericolonic fat stranding. Oral contrast progresses to the end colostomy. No evidence of an entero-colonic fistula. Vascular/Lymphatic: Atherosclerotic nonaneurysmal abdominal aorta. Patent portal, splenic, hepatic and renal veins. No pathologically enlarged lymph nodes in the abdomen or pelvis. Reproductive: Status post hysterectomy, with no abnormal findings at the vaginal cuff. No adnexal mass. Other: No pneumoperitoneum. Stable trace free fluid in the deep pelvis. No focal fluid collection. Musculoskeletal: No aggressive appearing focal osseous lesions. Diffuse moth-eaten appearance of the bones. Moderate degenerative changes in the visualized thoracolumbar spine. IMPRESSION: 1. Interval progression of liver and pulmonary metastases. 2. Diffuse moth-eaten appearance of the bones, a nonspecific finding that could represent diffuse osteopenia or an infiltrative marrow process. 3.  New trace layering bilateral pleural effusions. Stable trace free fluid in the deep pelvis. No focal fluid collections in the abdomen or pelvis. 4. No evidence of bowel obstruction or acute bowel inflammation. No evidence of an entero-colonic fistula. Electronically Signed   By: Ilona Sorrel M.D.   On: 08/06/2015 13:17    Time Spent in minutes  25   Louellen Molder M.D on 08/11/2015 at 11:28 AM  Between 7am to 7pm - Pager - [phone]  After 7pm go to [email or web address] - password [facility]  [facility] -  Office  [phone]

## 2015-08-12 DIAGNOSIS — E039 Hypothyroidism, unspecified: Secondary | ICD-10-CM

## 2015-08-12 DIAGNOSIS — N839 Noninflammatory disorder of ovary, fallopian tube and broad ligament, unspecified: Secondary | ICD-10-CM

## 2015-08-12 MED ORDER — SACCHAROMYCES BOULARDII 250 MG PO CAPS: 250.0000 mg | ORAL_CAPSULE | Freq: Two times a day (BID) | ORAL | Status: DC

## 2015-08-12 MED ORDER — MAGNESIUM CHLORIDE 64 MG PO TBEC: 2.0000 | DELAYED_RELEASE_TABLET | Freq: Every day | ORAL | Status: DC

## 2015-08-12 MED ORDER — VANCOMYCIN HCL 125 MG PO CAPS: 125.0000 mg | ORAL_CAPSULE | Freq: Four times a day (QID) | ORAL | Status: DC

## 2015-08-12 MED ORDER — OCTREOTIDE ACETATE 50 MCG/ML IJ SOLN: 200.0000 ug | Freq: Three times a day (TID) | INTRAMUSCULAR | Status: DC

## 2015-08-12 MED ORDER — ENSURE ENLIVE PO LIQD: 237.0000 mL | Freq: Three times a day (TID) | ORAL | Status: DC

## 2015-08-12 NOTE — Progress Notes (Signed)
Patient ID: Bethany [name], female   DOB: Aug 17, 1928, 82 y.o.   MRN: [id number]         [facility]    Date of Admission:  07/31/2015           Day 21 oral vancomycin  Principal Problem:   Diarrhea Active Problems:   C. difficile colitis   Pulmonary embolism (HCC)   Gastroesophageal reflux disease   Hypertension   Ovarian mass, right, s/p robotic excision 05/17/2014   Rectal cancer s/p LAResectin/colostomy 05/17/2014   Hypothyroidism   Protein-calorie malnutrition, moderate (HCC)   Anxiety and depression   Lumbar radicular pain   Rectal cancer metastasized to liver (HCC)   Hypokalemia   Hypomagnesemia   . cholecalciferol  2,000 Units Oral Daily  . feeding supplement (ENSURE ENLIVE)  237 mL Oral TID BM  . folic acid  1 mg Oral Daily  . gabapentin  100 mg Oral TID  . levothyroxine  25 mcg Oral Once per day on Sun Mon Wed Fri  . levothyroxine  75 mcg Oral Once per day on Tue Thu Sat  . multivitamin  15 mL Oral Daily  . octreotide  200 mcg Subcutaneous TID  . rivaroxaban  20 mg Oral Q supper  . saccharomyces boulardii  250 mg Oral BID  . vancomycin  500 mg Oral Q12H   Followed by  . [START ON 08/13/2015] vancomycin  125 mg Oral Q12H   Followed by  . [START ON 08/20/2015] vancomycin  125 mg Oral Daily   Followed by  . [START ON 08/27/2015] vancomycin  125 mg Oral Q48H   Followed by  . [START ON 09/04/2015] vancomycin  125 mg Oral Q72H  . vitamin B-12  2,000 mcg Oral Daily    SUBJECTIVE: She Is feeling better.  Review of Systems: Review of Systems  Constitutional: Positive for weight loss and malaise/fatigue. Negative for fever, chills and diaphoresis.  Gastrointestinal: Positive for diarrhea. Negative for heartburn, nausea, vomiting and abdominal pain.  Neurological: Positive for weakness and headaches.  Psychiatric/Behavioral: The patient is nervous/anxious.     Past Medical History  Diagnosis Date  . Migraine   . GERD (gastroesophageal reflux  disease)   . Hypercholesteremia   . Pulmonary embolism ([place]) 01/01/2012  / 11/28/2013  . Hypothyroidism   . Rectal mass   . Ovarian tumor     sees dr Lisbeth Renshaw ob-gyn yearly for evaluation  . Cancer ([place]) 09/29/13    rectal adenocarcinoma  . [place] 09/29/13    invasive adeocarcinoma  . Hx of radiation therapy 10/25/13-11/28/13    pelvis, 4500 cGy in 25 sessions  . Incontinence of urine   . Hypertension     stopped med during chemo /radiation - BP has been stable  . Numbness     "I have no feeling in my left leg" unknown cause  . Arthritis   . History of skin cancer     removed from forehead  . Rosacea   . Rectal cancer ([place])   . Frequent bowel movements   . Hx: UTI (urinary tract infection)   . Depression   . Anxiety   . Severe sepsis with septic shock ([name]) 11/28/2013  . UTI (lower urinary tract infection) 01/23/2014  . Acute encephalopathy 12/08/2013  . Clostridium difficile colitis 12/08/2013  . Cor pulmonale, acute ([place]) 01/02/2012  . Fall from standing 01/22/2014  . Hypokalemia 11/28/2013  . 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 Smoking status: Never Smoker   . Smokeless tobacco: Never Used  . Alcohol Use: No    Family History  Problem Relation Age of Onset  . Brain cancer Mother   . Heart attack Father   . Diabetes Sister   . Heart disease Sister    Allergies  Allergen Reactions  . Cataflam [Diclofenac] Other (See Comments)    Unknown; patient is unaware of allergy.  . Cephalosporins Shortness Of Breath  . Erythromycin Shortness Of Breath  . Keflex [Cephalexin] Shortness Of Breath  . Propoxyphene     Unknown rxn  . Codeine Nausea And Vomiting  . Naproxen Other (See Comments)    Unknown; patient is unaware of allergy  . Penicillins Other (See Comments)    Patient states that she CAN take this; unaware of allergy, pt/family is unable to answer penicillin related questions as they do not recall this allergy  reaction at all  . Synalgos-Dc [Aspirin-Caff-Dihydrocodeine] Other (See Comments)    Unknown; patient is unaware of allergy  . Ultram [Tramadol] Other (See Comments)    Unknown; patient is unaware of allergy    OBJECTIVE: Filed Vitals:   08/11/15 0451 08/11/15 1500 08/11/15 2253 08/12/15 0639  BP: 124/66 108/68 132/65 117/68  Pulse: 68 62 68 63  Temp: 97.8 F (36.6 C) 97.6 F (36.4 C) 97.4 F (36.3 C) 97.8 F (36.6 C)  TempSrc: Oral Oral Oral Oral  Resp: 20 18 18 17   Height:      Weight:      SpO2: 98% 100% 100% 99%   Body mass index is 15.19 kg/(m^2).  Physical Exam  Constitutional: She is oriented to person, place, and time.  She is looking better and is less anxious today. She is smiling.  Cardiovascular: Normal rate and regular rhythm.   No murmur heard. Pulmonary/Chest: Effort normal and breath sounds normal.  Abdominal: Soft. Bowel sounds are normal. There is no tenderness.  Neurological: She is alert and oriented to person, place, and time.  Skin: No rash noted.  Psychiatric: Mood and affect normal.   Her colostomy output was 3250 mL yesterday.  Lab Results Lab Results  Component Value Date   WBC 4.4 08/02/2015   HGB 10.8* 08/02/2015   HCT 31.1* 08/02/2015   MCV 105.8* 08/02/2015   PLT 139* 08/02/2015    Lab Results  Component Value Date   CREATININE 0.42* 08/11/2015   BUN 6 08/11/2015   NA 136 08/11/2015   K 4.3 08/11/2015   CL 104 08/11/2015   CO2 28 08/11/2015    Lab Results  Component Value Date   ALT 21 07/04/2015   AST 26 07/04/2015   ALKPHOS 86 07/04/2015   BILITOT 1.03 07/04/2015     Microbiology: No results found for this or any previous visit (from the past 240 hour(s)).   ASSESSMENT: She is improving slowly. Her colostomy output has decreased. It is unclear to me if this is due to ongoing treatment of persistent C. difficile colitis or institution of octreotide. I would complete her vancomycin taper presuming that she has  persistent C. difficile.  PLAN: 1. Complete vancomycin taper  On discharge start:  125 mg orally twice daily for 7 days, then  125 mg orally once daily for 7 days, then  125 mg orally every other day for 7 days, then  125 mg orally every 3 days for 14 days  I will sign off now.  Michel Bickers, MD [facility] for  Infectious Disease [facility] [phone] pager   [phone] cell 08/12/2015, 11:08 AM

## 2015-08-12 NOTE — Progress Notes (Signed)
Patient and nephew verbalized understanding of discharge instructions. Patient is stable at discharge. Nephew was shown how to give subcutanous injections. Nephew verbalized understanding of administrating injections.

## 2015-08-12 NOTE — Discharge Summary (Signed)
Physician Discharge Summary  Bethany [name] [id number] DOB: 1928/11/13 DOA: 07/31/2015  PCP: [name], MD  Admit date: 07/31/2015 Discharge date: 08/12/2015  Admitted From: Home Disposition: Home with home health RN and PT  Recommendations for Outpatient Follow-up:  1. Follow up with PCP in 1-2 weeks. Please check electrolytes including magnesium during outpatient follow-up. Patient will complete tapering dose of oral vancomycin on 09/16/2015. 2. Follow-up with GI Dr. Benson Norway in 1 week. 3. Follow-up with ID Dr. Megan Salon in 4 weeks.  Home Health: Yes, PT/OT and RN Equipment/Devices: None  Discharge Condition: Guarded CODE STATUS: DO NOT RESUSCITATE Diet recommendation: Regular with supplements     Discharge Diagnoses:  Principal Problem:   Diarrhea with high colostomy output  Active Problems:   C. difficile colitis   Protein-calorie malnutrition, severe (HCC)   Pulmonary embolism (HCC)   Gastroesophageal reflux disease   Hypertension   Ovarian mass, right, s/p robotic excision 05/17/2014   Rectal cancer s/p LAResectin/colostomy 05/17/2014   Hypothyroidism   Anxiety and depression   Lumbar radicular pain   Rectal cancer metastasized to liver [facility])  Brief narrative/history of present illness Please refer to admission H&P for details, in brief, 82 year old female with history of rectal adenocarcinoma (T3N1) s/p chemo/XRT 9-11/2013, s/p LAR 05/2014, fibrothecoma, PE, and HTN admitted for persistent diarrhea presumed secondary to C. Diff. In the past she had a C. Diff infection while she was being treated for her rectal cancer. Her diarrhea started earlier in May and her initial stool study was negative for C. Diff, but she was treated emperically with metronidazole x 14 days with an excellent response. One week after stopping the antibiotics she had a recurrence of her diarrhea and the stool study during this second episode was positive for C. Diff. She did not respond to the second  round of treatment with metronidazole and she was subsequently started on vancomycin 125 mg QID. , after 5-6 days of treatment she did not have a drop in her bowel movements. Patient admitted to hospital for further management of persistent diarrhea. Hospital course prolonged due to persistent high colostomy output.   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 difficile antigen and toxin both were negative in the hospital given recurrent diarrhea was decided that patient will be treated with slow tapering dose of oral vancomycin.  Added florastor.  As per ID vancomycin taper regimen will be, 125 mg orally twice daily for 7 days, then 125 mg orally once daily for 7 days, then 125 mg orally every other day for 7 days, then 125 mg orally every 3 days for 14 days. Stop date of antibiotic will be 09/16/2015. She can follow-up with Dr. Megan Salon after that.      Active Problems:  High colostomy output Lomotil discontinued. Concerning for secretory diarrhea although stool osmolality does not support it (107). He however showed response with IV octreotide and Dr. Benson Norway recommends discharging her on subcutaneous octreotide until he sees her in the office in one week. Her stool output in last 48 hours has much improved (patient informs it is less than her output at home)   History of Pulmonary embolism ([place]) Continue xarelto.   Gastroesophageal reflux disease Off pepcid.   essential Hypertension  Stable  Hypokalemia/ hypomagnesemia Secondary to GI loss. Replenished aggressively. Will increase magnesium supplement dose upon discharge.   Hypothyroidism continue synthroid  Protein-calorie malnutrition, moderate ([place]) Added further supplement.   Ovarian mass, right, s/p robotic excision 05/17/2014  Rectal cancer s/p LAResectin/colostomy 05/17/2014 Noted for liver metastases. Follows with Dr. Alen Blew. xeloda on hold while  hospitalized. As per oncologist note from May, considering liver directed therapy if progression noted on repeat imaging.   Generalized weakness Seen by physical therapy and recommended home health with supervision. Patient will be discharged home with home health RN and PT. Discussed with nephew on the phone who is alert to administer subcutaneous injection before patient is discharged home (full octreotide)        Family Communication : Discussed with nephew on the phone.  Disposition Plan : Home with home health    Consults :  ID GI (Dr. Benson Norway)  Procedures :  CT abdomen and pelvis  Discharge Instructions     Medication List    STOP taking these medications        cholestyramine 4 g packet  Commonly known as:  QUESTRAN     polyethylene glycol packet  Commonly known as:  MIRALAX / GLYCOLAX      TAKE these medications        acetaminophen 325 MG tablet  Commonly known as:  TYLENOL  Take 2 tablets (650 mg total) by mouth every 6 (six) hours as needed for mild pain (or Fever >/= 101).     capecitabine 500 MG tablet  Commonly known as:  XELODA  Take 1 tablet in the morning and 2 tablets in the evening after meals for 14 days then 7 days off.     cholecalciferol 1000 units tablet  Commonly known as:  VITAMIN D  Take 2,000 Units by mouth daily.     clobetasol cream 0.05 %  Commonly known as:  TEMOVATE  Apply 1 application topically 2 (two) times daily.     feeding supplement Liqd  Take 1 Container by mouth 3 (three) times daily as needed (appetite).     feeding supplement (ENSURE ENLIVE) Liqd  Take 237 mLs by mouth 3 (three) times daily between meals.     folic acid [id number] MCG tablet  Commonly known as:  FOLVITE  Take 800 mcg by mouth daily.     gabapentin 100 MG capsule  Commonly known as:  NEURONTIN  Take 1 capsule (100 mg total) by mouth 3 (three) times daily.     levothyroxine 75 MCG tablet  Commonly known as:  SYNTHROID, LEVOTHROID  Take 1  tablet by mouth See admin instructions. 27mcg on Tuesday, Thursday, Saturday.     levothyroxine 25 MCG tablet  Commonly known as:  SYNTHROID, LEVOTHROID  Take 25 mcg by mouth See admin instructions. 25 mcg  on Monday, Wednesday, Friday and Sunday.     magnesium chloride 64 MG Tbec SR tablet  Commonly known as:  SLOW-MAG  Take 2 tablets (128 mg total) by mouth daily.     octreotide 50 MCG/ML Soln injection  Commonly known as:  SANDOSTATIN  Inject 4 mLs (200 mcg total) into the skin every 8 (eight) hours.     ondansetron 8 MG tablet  Commonly known as:  ZOFRAN  Take 1 tablet (8 mg total) by mouth every 8 (eight) hours as needed for nausea or vomiting.     PEDIALYTE Soln  Take 240 mLs by mouth 3 (three) times daily as needed (poor appetite).     prochlorperazine 10 MG tablet  Commonly known as:  COMPAZINE  Take 1 tablet (10 mg total) by mouth every 6 (six) hours as needed for nausea or vomiting.     RISA-BID PROBIOTIC PO  Take 1 tablet by mouth 2 (two) times daily.     rivaroxaban 20 MG Tabs tablet  Commonly known as:  XARELTO  Take 1 tablet (20 mg total) by mouth daily with supper.     saccharomyces boulardii 250 MG capsule  Commonly known as:  FLORASTOR  Take 1 capsule (250 mg total) by mouth 2 (two) times daily.     vancomycin 125 MG capsule  Commonly known as:  VANCOCIN   125 mg orally twice daily for 7 days, then 125 mg orally once daily for 7 days, then 125 mg orally every other day for 7 days, then 125 mg orally every 3 days for 14 days.  Stop date 09/17/2015     vitamin B-12 1000 MCG tablet  Commonly known as:  CYANOCOBALAMIN  Place 2,000 mcg under the tongue daily.           Follow-up Information    Follow up with [name], MD. Schedule an appointment as soon as possible for a visit in 1 week.   Specialty:  Internal Medicine   Contact information:   [place] [address] [phone]       Follow up with Beryle Beams, MD.  Call in 1 week.   Specialty:  Gastroenterology   Contact information:   [address] [place] [address] [phone]       Follow up with Michel Bickers, MD. Schedule an appointment as soon as possible for a visit in 4 weeks.   Specialty:  Infectious Diseases   Contact information:   [address] [place] [address] [phone]      Allergies  Allergen Reactions  . Cataflam [Diclofenac] Other (See Comments)    Unknown; patient is unaware of allergy.  . Cephalosporins Shortness Of Breath  . Erythromycin Shortness Of Breath  . Keflex [Cephalexin] Shortness Of Breath  . Propoxyphene     Unknown rxn  . Codeine Nausea And Vomiting  . Naproxen Other (See Comments)    Unknown; patient is unaware of allergy  . Penicillins Other (See Comments)    Patient states that she CAN take this; unaware of allergy, pt/family is unable to answer penicillin related questions as they do not recall this allergy reaction at all  . Synalgos-Dc [Aspirin-Caff-Dihydrocodeine] Other (See Comments)    Unknown; patient is unaware of allergy  . Ultram [Tramadol] Other (See Comments)    Unknown; patient is unaware of allergy        Procedures/Studies: Ct Abdomen Pelvis W Contrast  08/06/2015  CLINICAL DATA:  82 year old female with rectal adenocarcinoma status post low anterior resection 05/17/2014 and radiation therapy, with liver and lung metastases, admitted with diarrhea and high colostomy output. Patient has been receiving therapy for C diff colitis. EXAM: CT ABDOMEN AND PELVIS WITH CONTRAST TECHNIQUE: Multidetector CT imaging of the abdomen and pelvis was performed using the standard protocol following bolus administration of intravenous contrast. CONTRAST:  66mL ISOVUE-300 IOPAMIDOL (ISOVUE-300) INJECTION 61% COMPARISON:  05/01/2015 PET-CT.  04/04/2014 CT abdomen/pelvis. FINDINGS: Lower chest: New trace layering bilateral pleural effusions. At least 6 pulmonary  nodules scattered throughout both lung bases have increased in size since 05/01/2015. For example a medial right lower lobe 1.7 x 1.3 cm pulmonary nodule (series 5/ image 3) increased from 1.2 x 0.9 cm and a left lower lobe 1.3 x 1.0 cm pulmonary nodule (series 5/ image 3), increased from 0.8 x 0.7 cm. Hepatobiliary: There are 5 liver masses demonstrating  heterogeneous hypo enhancement, which have increased in size since 05/01/2015 with representative masses as follows: - segment 2 left liver lobe 5.3 x 4.8 cm mass (series 2/image 17), increased from 3.8 x 3.4 cm - segment 8 right liver lobe 4.3 x 3.3 cm mass (series 2/image 17), increased from 3.0 x 2.4 cm - segment 7/8 right liver lobe 7.4 x 4.6 cm mass (series 2/image 17), increased from 5.6 x 4.6 cm Normal gallbladder with no radiopaque cholelithiasis. No biliary ductal dilatation. Pancreas: Normal, with no mass or duct dilation. Spleen: Normal size. No mass. Adrenals/Urinary Tract: Normal adrenals. No hydronephrosis. Simple parapelvic renal cysts in both renal sinuses. Subcentimeter hypodense renal cortical lesions in both kidneys are too small to characterize. Normal bladder. Stomach/Bowel: Grossly normal stomach. Normal caliber small bowel with no small bowel wall thickening. Appendix not discretely visualized. Status post low anterior resection with left end-colostomy. No large bowel wall thickening or pericolonic fat stranding. Oral contrast progresses to the end colostomy. No evidence of an entero-colonic fistula. Vascular/Lymphatic: Atherosclerotic nonaneurysmal abdominal aorta. Patent portal, splenic, hepatic and renal veins. No pathologically enlarged lymph nodes in the abdomen or pelvis. Reproductive: Status post hysterectomy, with no abnormal findings at the vaginal cuff. No adnexal mass. Other: No pneumoperitoneum. Stable trace free fluid in the deep pelvis. No focal fluid collection. Musculoskeletal: No aggressive appearing focal osseous lesions.  Diffuse moth-eaten appearance of the bones. Moderate degenerative changes in the visualized thoracolumbar spine. IMPRESSION: 1. Interval progression of liver and pulmonary metastases. 2. Diffuse moth-eaten appearance of the bones, a nonspecific finding that could represent diffuse osteopenia or an infiltrative marrow process. 3. New trace layering bilateral pleural effusions. Stable trace free fluid in the deep pelvis. No focal fluid collections in the abdomen or pelvis. 4. No evidence of bowel obstruction or acute bowel inflammation. No evidence of an entero-colonic fistula. Electronically Signed   By: Ilona Sorrel M.D.   On: 08/06/2015 13:17       Discharge Exam: Filed Vitals:   08/11/15 2253 08/12/15 0639  BP: 132/65 117/68  Pulse: 68 63  Temp: 97.4 F (36.3 C) 97.8 F (36.6 C)  Resp: 18 17   Filed Vitals:   08/11/15 0451 08/11/15 1500 08/11/15 2253 08/12/15 0639  BP: 124/66 108/68 132/65 117/68  Pulse: 68 62 68 63  Temp: 97.8 F (36.6 C) 97.6 F (36.4 C) 97.4 F (36.3 C) 97.8 F (36.6 C)  TempSrc: Oral Oral Oral Oral  Resp: 20 18 18 17   Height:      Weight:      SpO2: 98% 100% 100% 99%    Gen: Elderly thin built female appears frail, not in distress HEENT: no pallor, moist mucosa, supple neck Chest: clear b/l, no added sounds CVS: S1 and S2 regular,, no murmurs,  GI: soft, NT, ND, BS+, colostomy bag  Musculoskeletal: warm, no edema CNS: Alert and oriented    The results of significant diagnostics from this hospitalization (including imaging, microbiology, ancillary and laboratory) are listed below for reference.     Microbiology: No results found for this or any previous visit (from the past 240 hour(s)).   Labs: BNP (last 3 results) No results for input(s): BNP in the last 8760 hours. Basic Metabolic Panel:  Recent Labs Lab 08/07/15 0506 08/08/15 0519 08/09/15 0535 08/10/15 0538 08/11/15 0524  NA 136 138 137 137 136  K 3.5 3.9 4.1 3.8 4.3  CL 109 110  108 109 104  CO2 24 23 24 23 28   GLUCOSE  104* 104* 110* 104* 126*  BUN 7 8 8 7 6   CREATININE 0.34* 0.41* 0.39* 0.34* 0.42*  CALCIUM 8.3* 8.5* 8.5* 8.4* 8.6*  MG 1.7 1.4* 1.9 1.4* 1.5*   Liver Function Tests: No results for input(s): AST, ALT, ALKPHOS, BILITOT, PROT, ALBUMIN in the last 168 hours. No results for input(s): LIPASE, AMYLASE in the last 168 hours. No results for input(s): AMMONIA in the last 168 hours. CBC: No results for input(s): WBC, NEUTROABS, HGB, HCT, MCV, PLT in the last 168 hours. Cardiac Enzymes: No results for input(s): CKTOTAL, CKMB, CKMBINDEX, TROPONINI in the last 168 hours. BNP: Invalid input(s): POCBNP CBG: No results for input(s): GLUCAP in the last 168 hours. D-Dimer No results for input(s): DDIMER in the last 72 hours. Hgb A1c No results for input(s): HGBA1C in the last 72 hours. Lipid Profile No results for input(s): CHOL, HDL, LDLCALC, TRIG, CHOLHDL, LDLDIRECT in the last 72 hours. Thyroid function studies No results for input(s): TSH, T4TOTAL, T3FREE, THYROIDAB in the last 72 hours.  Invalid input(s): FREET3 Anemia work up No results for input(s): VITAMINB12, FOLATE, FERRITIN, TIBC, IRON, RETICCTPCT in the last 72 hours. Urinalysis    Component Value Date/Time   COLORURINE YELLOW 07/31/2015 1456   APPEARANCEUR CLEAR 07/31/2015 1456   LABSPEC 1.020 07/31/2015 1456   PHURINE 5.5 07/31/2015 1456   GLUCOSEU NEGATIVE 07/31/2015 1456   HGBUR SMALL* 07/31/2015 1456   BILIRUBINUR SMALL* 07/31/2015 1456   KETONESUR NEGATIVE 07/31/2015 1456   PROTEINUR NEGATIVE 07/31/2015 1456   UROBILINOGEN 0.2 01/22/2014 1930   NITRITE NEGATIVE 07/31/2015 1456   LEUKOCYTESUR SMALL* 07/31/2015 1456   Sepsis Labs Invalid input(s): PROCALCITONIN,  WBC,  LACTICIDVEN Microbiology No results found for this or any previous visit (from the past 240 hour(s)).   Time coordinating discharge: Over 30 minutes  SIGNED:   Louellen Molder, MD  [facility] 08/12/2015, 10:43 AM Pager   If 7PM-7AM, please contact night-coverage [email or web address] Password [email or web address]

## 2015-08-12 NOTE — Progress Notes (Signed)
Subjective: No complaints.  Current stool output is "a little more" than her baseline.  Objective: Vital signs in last 24 hours: Temp:  [97.4 F (36.3 C)-97.8 F (36.6 C)] 97.8 F (36.6 C) (07/03 0639) Pulse Rate:  [62-68] 63 (07/03 0639) Resp:  [17-18] 17 (07/03 0639) BP: (108-132)/(65-68) 117/68 mmHg (07/03 0639) SpO2:  [99 %-100 %] 99 % (07/03 0639) Last BM Date: 08/11/15  Intake/Output from previous day: 07/02 0701 - 07/03 0700 In: 2770 [P.O.:960; I.V.:1810] Out: 2350 [Urine:1200; Stool:1150] Intake/Output this shift: Total I/O In: 240 [P.O.:240] Out: 25 [Stool:25]  General appearance: alert and no distress GI: Not examined  Lab Results: No results for input(s): WBC, HGB, HCT, PLT in the last 72 hours. BMET  Recent Labs  08/10/15 0538 08/11/15 0524  NA 137 136  K 3.8 4.3  CL 109 104  CO2 23 28  GLUCOSE 104* 126*  BUN 7 6  CREATININE 0.34* 0.42*  CALCIUM 8.4* 8.6*   LFT No results for input(s): PROT, ALBUMIN, AST, ALT, ALKPHOS, BILITOT, BILIDIR, IBILI in the last 72 hours. PT/INR No results for input(s): LABPROT, INR in the last 72 hours. Hepatitis Panel No results for input(s): HEPBSAG, HCVAB, HEPAIGM, HEPBIGM in the last 72 hours. C-Diff No results for input(s): CDIFFTOX in the last 72 hours. Fecal Lactopherrin No results for input(s): FECLLACTOFRN in the last 72 hours.  Studies/Results: No results found.  Medications:  Scheduled: . cholecalciferol  2,000 Units Oral Daily  . feeding supplement (ENSURE ENLIVE)  237 mL Oral TID BM  . folic acid  1 mg Oral Daily  . gabapentin  100 mg Oral TID  . levothyroxine  25 mcg Oral Once per day on Sun Mon Wed Fri  . levothyroxine  75 mcg Oral Once per day on Tue Thu Sat  . multivitamin  15 mL Oral Daily  . octreotide  200 mcg Subcutaneous TID  . rivaroxaban  20 mg Oral Q supper  . saccharomyces boulardii  250 mg Oral BID  . vancomycin  500 mg Oral Q12H   Followed by  . [START ON 08/13/2015] vancomycin   125 mg Oral Q12H   Followed by  . [START ON 08/20/2015] vancomycin  125 mg Oral Daily   Followed by  . [START ON 08/27/2015] vancomycin  125 mg Oral Q48H   Followed by  . [START ON 09/04/2015] vancomycin  125 mg Oral Q72H  . vitamin B-12  2,000 mcg Oral Daily   Continuous: . dextrose 5 % and 0.45 % NaCl with KCl 40 mEq/L 75 mL/hr at 08/12/15 0038    Assessment/Plan: 1)  Diarrhea - 24 hour output around 1100.  She reports that this is a little more than her baseline.  Her electrolytes are stable.  The stool osmolality calcuation is at 107.  This does not appear to be secretory, however, she has made a response to octreotide.  I think it is fine for her to be D/C'ed home today and I will manage her as an outpatient.  Most likely she will not require the octreotide.  Continue with the vanc taper.  She will follow up in the office in 1 week.  LOS: 12 days   Kollin Udell D 08/12/2015, 10:16 AM

## 2015-08-12 NOTE — Progress Notes (Signed)
Physical Therapy Treatment Patient Details Name: Bethany [name] MRN: [id number] DOB: Dec 14, 1928 Today's Date: Aug 20, 2015    History of Present Illness 82 yo female admitted with C-diff.     PT Comments    Pt progressing, hopeful she will go home today; will benefit from HHPT  Follow Up Recommendations  Home health PT;Supervision - Intermittent     Equipment Recommendations  None recommended by PT    Recommendations for Other Services       Precautions / Restrictions Precautions Precautions: Fall Restrictions Weight Bearing Restrictions: No    Mobility  Bed Mobility               General bed mobility comments: NT--pt on EOB with nurse tech  Transfers Overall transfer level: Needs assistance Equipment used: Rolling walker (2 wheeled) Transfers: Sit to/from Stand Sit to Stand: Supervision;Modified independent (Device/Increase time)         General transfer comment: initial cues for hand placement  Ambulation/Gait Ambulation/Gait assistance: Supervision Ambulation Distance (Feet): 80 Feet Assistive device: Rolling walker (2 wheeled) Gait Pattern/deviations: Step-to pattern;Step-through pattern;Decreased stride length;Trunk flexed     General Gait Details: pt able to return demo safe technique with RW, one 15sec standing rest   Stairs            Wheelchair Mobility    Modified Rankin (Stroke Patients Only)       Balance Overall balance assessment: Needs assistance   Sitting balance-Leahy Scale: Good     Standing balance support: Bilateral upper extremity supported;Single extremity supported;During functional activity Standing balance-Leahy Scale: Fair                      Cognition Arousal/Alertness: Awake/alert Behavior During Therapy: WFL for tasks assessed/performed;Anxious Overall Cognitive Status: Within Functional Limits for tasks assessed                      Exercises      General Comments         Pertinent Vitals/Pain Pain Assessment: No/denies pain    Home Living                      Prior Function            PT Goals (current goals can now be found in the care plan section) Acute Rehab PT Goals Patient Stated Goal: home PT Goal Formulation: With patient/family Time For Goal Achievement: 08/15/15 Potential to Achieve Goals: Good Progress towards PT goals: Progressing toward goals    Frequency  Min 3X/week    PT Plan Current plan remains appropriate    Co-evaluation             End of Session Equipment Utilized During Treatment: Gait belt Activity Tolerance: Patient tolerated treatment well Patient left: in bed;with call bell/phone within reach;with bed alarm set     Time: [id number] PT Time Calculation (min) (ACUTE ONLY): 12 min  Charges:  $Gait Training: 8-22 mins                    G Codes:      Naitik Hermann 08/20/2015, 12:41 PM

## 2015-08-12 NOTE — Progress Notes (Signed)
Occupational Therapy Treatment Patient Details Name: Bethany [name] MRN: [id number] DOB: July 14, 1928 Today's Date: 08/12/2015    History of present illness 82 yo female admitted with C-diff.    OT comments  Pt hopeful to DC home this day  Follow Up Recommendations  Supervision - Intermittent;Home health OT    Equipment Recommendations  None recommended by OT    Recommendations for Other Services      Precautions / Restrictions Precautions Precautions: Fall Restrictions Weight Bearing Restrictions: No       Mobility Bed Mobility Overal bed mobility: Modified Independent             General bed mobility comments: NT--pt on EOB with nurse tech  Transfers Overall transfer level: Needs assistance Equipment used: Rolling walker (2 wheeled) Transfers: Sit to/from [facility] Sit to Stand: Supervision Stand pivot transfers: Supervision       General transfer comment: for safety    Balance Overall balance assessment: Needs assistance   Sitting balance-Leahy Scale: Good     Standing balance support: Bilateral upper extremity supported;Single extremity supported;During functional activity Standing balance-Leahy Scale: Fair                     ADL                           Toilet Transfer: Supervision/safety;Ambulation;Regular Toilet;RW   Toileting- Clothing Manipulation and Hygiene: Supervision/safety;Sit to/from stand       Functional mobility during ADLs: Supervision/safety;Rolling walker General ADL Comments: Pt hopes to go home this day /  Pt will be alone at night. Pt benefit from [facility] OT                Cognition   Behavior During Therapy: [facility] for tasks assessed/performed;Anxious Overall Cognitive Status: Within Functional Limits for tasks assessed                                    Pertinent Vitals/ Pain       Pain Assessment: No/denies pain         Frequency       Progress Toward Goals  OT  Goals(current goals can now be found in the care plan section)  Progress towards OT goals: Progressing toward goals  Acute Rehab OT Goals Patient Stated Goal: home  Plan Discharge plan remains appropriate    Co-evaluation                 End of Session Equipment Utilized During Treatment: Rolling walker   Activity Tolerance Patient tolerated treatment well   Patient Left in bed;with call bell/phone within reach   Nurse Communication Mobility status        Time: [id number] OT Time Calculation (min): 12 min  Charges: OT General Charges $OT Visit: 1 Procedure OT Treatments $Self Care/Home Management : 8-22 mins  Altamese Deguire, Edwena Felty D 08/12/2015, 2:20 PM

## 2015-08-14 ENCOUNTER — Telehealth: Payer: Self-pay | Admitting: Oncology

## 2015-08-14 ENCOUNTER — Other Ambulatory Visit: Payer: Self-pay | Admitting: *Deleted

## 2015-08-14 NOTE — Telephone Encounter (Signed)
spoke w/ son per pof, pt sched w/ MD next available, confirmed 8/8 apt times

## 2015-08-14 NOTE — Progress Notes (Signed)
POF to schedulers for f/u hospital visit, 1st available.

## 2015-08-15 ENCOUNTER — Encounter: Payer: Self-pay | Admitting: *Deleted

## 2015-08-19 ENCOUNTER — Telehealth: Payer: Self-pay | Admitting: Oncology

## 2015-08-19 ENCOUNTER — Telehealth: Payer: Self-pay | Admitting: Hematology

## 2015-08-19 ENCOUNTER — Other Ambulatory Visit: Payer: Self-pay | Admitting: Oncology

## 2015-08-19 NOTE — Telephone Encounter (Signed)
per pof to sch pt appt-pt has scan to [facility]-they will call to sch

## 2015-08-19 NOTE — Telephone Encounter (Signed)
per pof to sch pt appt-*per Staci she will call pt to make aware of appt

## 2015-08-19 NOTE — Telephone Encounter (Signed)
Spoke with patient this morning re 7/17 f/u. Per patient she is not able to come 7/17 and would like to keep already scheduled appointment for 8/8. Message to FS.

## 2015-08-26 ENCOUNTER — Inpatient Hospital Stay: Payer: Medicare Other | Admitting: Oncology

## 2015-08-26 ENCOUNTER — Other Ambulatory Visit (HOSPITAL_BASED_OUTPATIENT_CLINIC_OR_DEPARTMENT_OTHER): Payer: Medicare Other

## 2015-08-26 ENCOUNTER — Telehealth: Payer: Self-pay | Admitting: Oncology

## 2015-08-26 ENCOUNTER — Ambulatory Visit (HOSPITAL_BASED_OUTPATIENT_CLINIC_OR_DEPARTMENT_OTHER): Payer: Medicare Other | Admitting: Oncology

## 2015-08-26 ENCOUNTER — Ambulatory Visit: Payer: Medicare Other | Admitting: Oncology

## 2015-08-26 VITALS — BP 141/47 | HR 67 | Temp 97.5°F | Resp 18 | Ht 67.0 in | Wt 114.2 lb

## 2015-08-26 DIAGNOSIS — C787 Secondary malignant neoplasm of liver and intrahepatic bile duct: Secondary | ICD-10-CM | POA: Diagnosis not present

## 2015-08-26 DIAGNOSIS — C7800 Secondary malignant neoplasm of unspecified lung: Secondary | ICD-10-CM

## 2015-08-26 DIAGNOSIS — C20 Malignant neoplasm of rectum: Secondary | ICD-10-CM | POA: Diagnosis not present

## 2015-08-26 LAB — CBC WITH DIFFERENTIAL/PLATELET
BASO%: 0.9 % (ref 0.0–2.0)
Basophils Absolute: 0 10e3/uL (ref 0.0–0.1)
EOS%: 3.3 % (ref 0.0–7.0)
Eosinophils Absolute: 0.2 10e3/uL (ref 0.0–0.5)
HCT: 34.6 % — ABNORMAL LOW (ref 34.8–46.6)
HGB: 11.5 g/dL — ABNORMAL LOW (ref 11.6–15.9)
LYMPH%: 6.9 % — ABNORMAL LOW (ref 14.0–49.7)
MCH: 35.5 pg — ABNORMAL HIGH (ref 25.1–34.0)
MCHC: 33.3 g/dL (ref 31.5–36.0)
MCV: 106.4 fL — ABNORMAL HIGH (ref 79.5–101.0)
MONO#: 0.4 10e3/uL (ref 0.1–0.9)
MONO%: 6.7 % (ref 0.0–14.0)
NEUT#: 4.5 10e3/uL (ref 1.5–6.5)
NEUT%: 82.2 % — ABNORMAL HIGH (ref 38.4–76.8)
Platelets: 195 10e3/uL (ref 145–400)
RBC: 3.25 10e6/uL — ABNORMAL LOW (ref 3.70–5.45)
RDW: 14.7 % — ABNORMAL HIGH (ref 11.2–14.5)
WBC: 5.5 10e3/uL (ref 3.9–10.3)
lymph#: 0.4 10e3/uL — ABNORMAL LOW (ref 0.9–3.3)

## 2015-08-26 LAB — COMPREHENSIVE METABOLIC PANEL WITH GFR
ALT: 17 U/L (ref 0–55)
AST: 28 U/L (ref 5–34)
Albumin: 3.3 g/dL — ABNORMAL LOW (ref 3.5–5.0)
Alkaline Phosphatase: 198 U/L — ABNORMAL HIGH (ref 40–150)
Anion Gap: 9 meq/L (ref 3–11)
BUN: 26 mg/dL (ref 7.0–26.0)
CO2: 29 meq/L (ref 22–29)
Calcium: 9.5 mg/dL (ref 8.4–10.4)
Chloride: 105 meq/L (ref 98–109)
Creatinine: 0.7 mg/dL (ref 0.6–1.1)
EGFR: 80 ml/min/1.73 m2 — ABNORMAL LOW
Glucose: 95 mg/dL (ref 70–140)
Potassium: 4.2 meq/L (ref 3.5–5.1)
Sodium: 143 meq/L (ref 136–145)
Total Bilirubin: 0.5 mg/dL (ref 0.20–1.20)
Total Protein: 6.1 g/dL — ABNORMAL LOW (ref 6.4–8.3)

## 2015-08-26 NOTE — Telephone Encounter (Signed)
per pof to sch pt appt-gave pt copy of avs °

## 2015-08-26 NOTE — Progress Notes (Signed)
Hematology and Oncology Follow Up Visit  Bethany [name] [id number] [birth year] 82 y.o. 08/26/2015 9:35 AM Bethany [name], MDKim, Bethany Rinks, MD   Principle Diagnosis: 82 year old woman with rectal adenocarcinoma diagnosed in August of 2015. Her staging by EUS his T3 N0. MRI of the liver showed potential hepatic metastasis.  Past therapy:  Radiation therapy concomitantly with oral Xeloda at 500 mg twice a day started on 10/25/2013. Therapy concluded in October 2015. She received total dose to 4320 cGy. Therapy interrupted due to recurrent hospitalizations. She is status post robotic-assisted rectosigmoid low anterior resection done on 05/17/2014. The pathology revealed a T3 N1 disease. PET/CT scan in July 2016 showed progresses metastatic disease in the liver and pulmonary nodules.   Current therapy:  Xeloda 1000 mg daily started in September 2016. She takes it 2 weeks on 1 week off per cycle. She is status post 3 cycles of therapy related to proceed with a third one. The dose was increased to 1500 mg daily starting on 01/04/2015.  Interim History:  Bethany [name] presents today for a followup visit with her son. Since the last visit, she was hospitalized in June 2017 for C. difficile colitis. She was admitted on 07/31/2015 gel July 3. She was treated with oral vancomycin which she is currently taking with tapering doses. Her diarrhea subsided at this time and her weight is improving. After she lost 10 pounds she is recovering quite nicely. She is no longer reporting any abdominal pain or distention. Has not reported any fevers.  Xeloda have been on hold since her acute illness and severe diarrhea from colitis. She have regained most activities of daily living including ambulation without any syncope or falls.  She has not had any headaches or blurry vision. She does not report any syncope or seizures. She does not report any chest pain, shortness of breath, cough or hemoptysis. She does not report any  palpitation, orthopnea or PND. She does not report any leg edema. She denies abdominal pain, constipation or obstruction. She does not report any frequency urgency or hesitancy. She is not abort any skeletal complaints. Rest of the review of systems unremarkable.     Medications: I have reviewed the patient's current medications.  Current Outpatient Prescriptions  Medication Sig Dispense Refill  . acetaminophen (TYLENOL) 325 MG tablet Take 2 tablets (650 mg total) by mouth every 6 (six) hours as needed for mild pain (or Fever >/= 101).    . cholecalciferol (VITAMIN D) 1000 UNITS tablet Take 2,000 Units by mouth daily.    . clobetasol cream (TEMOVATE) [id number] % Apply 1 application topically 2 (two) times daily. (Patient taking differently: Apply 1 application topically 2 (two) times daily as needed (irritation). ) 30 g 0  . feeding supplement (BOOST HIGH PROTEIN) LIQD Take 1 Container by mouth 3 (three) times daily as needed (appetite).    . feeding supplement, ENSURE ENLIVE, (ENSURE ENLIVE) LIQD Take 237 mLs by mouth 3 (three) times daily between meals. [id number] mL 12  . folic acid (FOLVITE) A999333 MCG tablet Take 800 mcg by mouth daily.    Marland Kitchen gabapentin (NEURONTIN) 100 MG capsule Take 1 capsule (100 mg total) by mouth 3 (three) times daily. 90 capsule 4  . levothyroxine (SYNTHROID, LEVOTHROID) 25 MCG tablet Take 25 mcg by mouth See admin instructions. 25 mcg  on Monday, Wednesday, Friday and Sunday.    . levothyroxine (SYNTHROID, LEVOTHROID) 75 MCG tablet Take 1 tablet by mouth See admin instructions. 90mcg on Tuesday, Thursday, Saturday.    Marland Kitchen  magnesium chloride (SLOW-MAG) 64 MG TBEC SR tablet Take 2 tablets (128 mg total) by mouth daily. 60 tablet 0  . ondansetron (ZOFRAN) 8 MG tablet Take 1 tablet (8 mg total) by mouth every 8 (eight) hours as needed for nausea or vomiting. 20 tablet 0  . PEDIALYTE (PEDIALYTE) SOLN Take 240 mLs by mouth 3 (three) times daily as needed (poor appetite).    . Probiotic  Product (RISA-BID PROBIOTIC PO) Take 1 tablet by mouth 2 (two) times daily.    . prochlorperazine (COMPAZINE) 10 MG tablet Take 1 tablet (10 mg total) by mouth every 6 (six) hours as needed for nausea or vomiting. 30 tablet 0  . rivaroxaban (XARELTO) 20 MG TABS tablet Take 1 tablet (20 mg total) by mouth daily with supper. 30 tablet 0  . saccharomyces boulardii (FLORASTOR) 250 MG capsule Take 1 capsule (250 mg total) by mouth 2 (two) times daily. 60 capsule 0  . vancomycin (VANCOCIN) 125 MG capsule Take 1 capsule (125 mg total) by mouth every 6 (six) hours. 500 mg orally twice daily for 7 days, then 125 mg orally twice daily for 7 days, then 125 mg orally once daily for 7 days, then 125 mg orally every other day for 7 days, then 125 mg orally every 3 days for 14 days.  Stop date 09/23/2015 85 capsule 0  . vitamin B-12 (CYANOCOBALAMIN) 1000 MCG tablet Place 2,000 mcg under the tongue daily.    . capecitabine (XELODA) 500 MG tablet Take 1 tablet in the morning and 2 tablets in the evening after meals for 14 days then 7 days off. (Patient not taking: Reported on 08/26/2015) 42 tablet 0   No current facility-administered medications for this visit.     Allergies:  Allergies  Allergen Reactions  . Cataflam [Diclofenac] Other (See Comments)    Unknown; patient is unaware of allergy.  . Cephalosporins Shortness Of Breath  . Erythromycin Shortness Of Breath  . Keflex [Cephalexin] Shortness Of Breath  . Propoxyphene     Unknown rxn  . Codeine Nausea And Vomiting  . Naproxen Other (See Comments)    Unknown; patient is unaware of allergy  . Penicillins Other (See Comments)    Patient states that she CAN take this; unaware of allergy, pt/family is unable to answer penicillin related questions as they do not recall this allergy reaction at all  . Synalgos-Dc [Aspirin-Caff-Dihydrocodeine] Other (See Comments)    Unknown; patient is unaware of allergy  . Ultram [Tramadol] Other (See Comments)     Unknown; patient is unaware of allergy    Past Medical History, Surgical history, Social history, and Family History were reviewed and updated.   Physical Exam: Blood pressure 141/47, pulse 67, temperature 97.5 F (36.4 C), temperature source Oral, resp. rate 18, height 5\' 7"  (1.702 m), weight 114 lb 3.2 oz (51.801 kg), SpO2 100 %. ECOG: 2 General appearance: Pleasant-appearing woman appeared without distress. Head: Normocephalic, without obvious abnormality  No oral ulcers or lesions. Neck: no adenopathy or thyroid masses. Lymph nodes: Cervical, supraclavicular, and axillary nodes normal. Heart: Irregular without murmurs, rubs or gallops. Lung:chest clear, no wheezing, rales, normal symmetric air entry.  Abdomen: soft, non-tender, without masses or organomegaly. No rebound or guarding.  EXT: Trace edema noted bilaterally. Neurological: No ambulation difficulties. No deficits noted on exam.   Lab Results: Lab Results  Component Value Date   WBC 5.5 08/26/2015   HGB 11.5* 08/26/2015   HCT 34.6* 08/26/2015   MCV 106.4* 08/26/2015  PLT 195 08/26/2015     Chemistry      Component Value Date/Time   NA 136 08/11/2015 0524   NA 143 07/04/2015 1456   K 4.3 08/11/2015 0524   K 3.5 07/04/2015 1456   CL 104 08/11/2015 0524   CO2 28 08/11/2015 0524   CO2 32* 07/04/2015 1456   BUN 6 08/11/2015 0524   BUN 12.2 07/04/2015 1456   CREATININE 0.42* 08/11/2015 0524   CREATININE 0.6 07/04/2015 1456      Component Value Date/Time   CALCIUM 8.6* 08/11/2015 0524   CALCIUM 9.4 07/04/2015 1456   ALKPHOS 86 07/04/2015 1456   ALKPHOS 62 01/23/2014 0511   AST 26 07/04/2015 1456   AST 15 01/23/2014 0511   ALT 21 07/04/2015 1456   ALT 10 01/23/2014 0511   BILITOT 1.03 07/04/2015 1456   BILITOT 0.7 01/23/2014 0511            Impression and Plan:  82 year old woman with the following issues:   1. Rectal cancer presented with hematochezia and status post colonoscopy and EUS.  The care of by EUS criteria was 4 cm in length around 4-5 cm from the anal verge. The staging was T3 N0 M1 with possible liver metastasis. She is status post radiation therapy with oral Xeloda with still residual tumor as evident by the PET scan results.   She is status post surgical resection with a pathology revealed invasive adenocarcinoma through the muscularis propria. There are 2 extramural satellite tumor nodules. 12 lymph nodes were negative for metastatic cancer.  PET CT scan on 09/05/2014 showed progression of disease with disease in the liver and lung nodules.   PET scan in March 2017 showed mild progression on Xeloda but for the most part her disease under reasonable control. She has elected to continue Xeloda for the time being.  Xeloda was held due to her acute illness and C. difficile colitis. The plan to resume this on August 1 to be taken and 1500 mg daily 2 weeks on one week off. Repeat imaging studies will be done in October 2017. Despite her mild progression of disease, Xeloda have been successful in maintaining her quality of life and decreasing the rapid progression of her disease.     2. Hepatic masses. Liver directed therapy can be an option if progression noted on the next imaging study.  3. History of pulmonary embolism: She is on full dose anticoagulation with Xarelto. No bleeding noted or interaction with Xeloda.  4. C. difficile colitis: She is currently on oral vancomycin and she is clinically recovered.  5. Follow-up: Will be to 4 weeks to assess complications of her next cycle of chemotherapy.  [facility], MD 7/17/20179:35 AM

## 2015-09-11 ENCOUNTER — Encounter: Payer: Self-pay | Admitting: Internal Medicine

## 2015-09-11 ENCOUNTER — Ambulatory Visit (INDEPENDENT_AMBULATORY_CARE_PROVIDER_SITE_OTHER): Payer: Medicare Other | Admitting: Internal Medicine

## 2015-09-11 DIAGNOSIS — A0472 Enterocolitis due to Clostridium difficile, not specified as recurrent: Secondary | ICD-10-CM

## 2015-09-11 DIAGNOSIS — A047 Enterocolitis due to Clostridium difficile: Secondary | ICD-10-CM

## 2015-09-11 MED ORDER — VANCOMYCIN HCL 125 MG PO CAPS: 125.0000 mg | ORAL_CAPSULE | Freq: Four times a day (QID) | ORAL | 0 refills | Status: AC

## 2015-09-11 NOTE — Progress Notes (Signed)
[place] for Infectious Disease  Patient Active Problem List   Diagnosis Date Noted  . C. difficile colitis 07/31/2015    Priority: Medium  . Generalized weakness   . Hypokalemia 05/18/2014  . Hypomagnesemia 05/18/2014  . Dysphonia, spasmodic 05/18/2014  . Rectal cancer metastasized to liver ([place]) 05/17/2014  . Lumbar radicular pain 03/15/2014  . Anxiety and depression   . Atrial fibrillation with RVR ([place]) 12/08/2013  . Anemia in neoplastic disease 12/08/2013  . Rash and nonspecific skin eruption 12/04/2013  . Protein-calorie malnutrition, moderate ([place]) 11/29/2013  . Hypothyroidism 11/28/2013  . Rectal cancer s/p LAResectin/colostomy 05/17/2014 10/11/2013  . Ovarian mass, right, s/p robotic excision 05/17/2014 04/14/2012  . Pulmonary embolism ([place]) 01/01/2012  . Gastroesophageal reflux disease 01/01/2012  . Hypertension 01/01/2012  . Dyslipidemia 01/01/2012    Patient's Medications  New Prescriptions   No medications on file  Previous Medications   ACETAMINOPHEN (TYLENOL) 325 MG TABLET    Take 2 tablets (650 mg total) by mouth every 6 (six) hours as needed for mild pain (or Fever >/= 101).   CAPECITABINE (XELODA) 500 MG TABLET    Take 1 tablet in the morning and 2 tablets in the evening after meals for 14 days then 7 days off.   CHOLECALCIFEROL (VITAMIN D) 1000 UNITS TABLET    Take 2,000 Units by mouth daily.   CLOBETASOL CREAM (TEMOVATE) 0.05 %    Apply 1 application topically 2 (two) times daily.   FEEDING SUPPLEMENT (BOOST HIGH PROTEIN) LIQD    Take 1 Container by mouth 3 (three) times daily as needed (appetite).   FEEDING SUPPLEMENT, ENSURE ENLIVE, (ENSURE ENLIVE) LIQD    Take 237 mLs by mouth 3 (three) times daily between meals.   FOLIC ACID (FOLVITE) A999333 MCG TABLET    Take 800 mcg by mouth daily.   GABAPENTIN (NEURONTIN) 100 MG CAPSULE    Take 1 capsule (100 mg total) by mouth 3 (three) times daily.   LEVOTHYROXINE (SYNTHROID, LEVOTHROID) 25 MCG TABLET     Take 25 mcg by mouth See admin instructions. 25 mcg  on Monday, Wednesday, Friday and Sunday.   LEVOTHYROXINE (SYNTHROID, LEVOTHROID) 75 MCG TABLET    Take 1 tablet by mouth See admin instructions. 78mcg on Tuesday, Thursday, Saturday.   MAGNESIUM CHLORIDE (SLOW-MAG) 64 MG TBEC SR TABLET    Take 2 tablets (128 mg total) by mouth daily.   ONDANSETRON (ZOFRAN) 8 MG TABLET    Take 1 tablet (8 mg total) by mouth every 8 (eight) hours as needed for nausea or vomiting.   PEDIALYTE (PEDIALYTE) SOLN    Take 240 mLs by mouth 3 (three) times daily as needed (poor appetite).   PROBIOTIC PRODUCT (RISA-BID PROBIOTIC PO)    Take 1 tablet by mouth 2 (two) times daily.   PROCHLORPERAZINE (COMPAZINE) 10 MG TABLET    Take 1 tablet (10 mg total) by mouth every 6 (six) hours as needed for nausea or vomiting.   RIVAROXABAN (XARELTO) 20 MG TABS TABLET    Take 1 tablet (20 mg total) by mouth daily with supper.   SACCHAROMYCES BOULARDII (FLORASTOR) 250 MG CAPSULE    Take 1 capsule (250 mg total) by mouth 2 (two) times daily.   VITAMIN B-12 (CYANOCOBALAMIN) 1000 MCG TABLET    Place 2,000 mcg under the tongue daily.  Modified Medications   Modified Medication Previous Medication   VANCOMYCIN (VANCOCIN) 125 MG CAPSULE vancomycin (VANCOCIN) 125 MG capsule  Take 1 capsule (125 mg total) by mouth every 6 (six) hours. 500 mg orally twice daily for 7 days, then 125 mg orally twice daily for 7 days, then 125 mg orally once daily for 7 days, then 125 mg orally every other day for 7 days, then 125 mg orally every 3 days for 14 days.  Stop date 09/23/2015    Take 1 capsule (125 mg total) by mouth every 6 (six) hours. 500 mg orally twice daily for 7 days, then 125 mg orally twice daily for 7 days, then 125 mg orally once daily for 7 days, then 125 mg orally every other day for 7 days, then 125 mg orally every 3 days for 14 days.  Stop date 09/23/2015  Discontinued Medications   No medications on file    Subjective: CARLYNN KNOPE is a 82 y.o. female who was diagnosed with C. difficile colitis in May. She received two 10 day courses of oral metronidazole with only partial improvement. She had clinical worsening and was started on oral vancomycin on 07/23/2015 but did not improve leading to admission on 07/31/2015. Her C. difficile toxin screen at that time was negative for C. difficile antigen and toxin. She was having several liters of stool output from her colostomy. She was started on oral vancomycin and I recommended discharge on a slow 6 week taper. She is now doing much better. She is only needing to empty her ostomy bag about 3 times daily and she considers that to be her normal pattern. She is due to complete her vancomycin taper on 09/23/2015. She has not had any fever, abdominal pain, nausea, or vomiting. Her appetite has improved and she has regained much of the weight she lost. She is being treated for rectal adenocarcinoma. Her chemotherapy was held while she was sick. She restarted Xeloda yesterday.  Review of Systems: Review of Systems  Constitutional: Positive for malaise/fatigue. Negative for chills, diaphoresis, fever and weight loss.  HENT: Negative for sore throat.   Respiratory: Negative for cough, sputum production and shortness of breath.   Cardiovascular: Negative for chest pain.  Gastrointestinal: Negative for abdominal pain, diarrhea, heartburn, nausea and vomiting.  Genitourinary: Negative for dysuria.  Musculoskeletal: Negative for joint pain and myalgias.  Skin: Negative for rash.  Neurological: Negative for headaches.    Past Medical History:  Diagnosis Date  . Acute encephalopathy 12/08/2013  . Anxiety   . Arthritis   . Cancer ([place]) 09/29/13   rectal adenocarcinoma  . Clostridium difficile colitis 12/08/2013  . Cor pulmonale, acute ([place]) 01/02/2012  . Depression   . Dysphonia, spasmodic 05/18/2014  . Fall from standing 01/22/2014  . Frequent bowel movements   . GERD  (gastroesophageal reflux disease)   . History of skin cancer    removed from forehead  . Hx of radiation therapy 10/25/13-11/28/13   pelvis, 4500 cGy in 25 sessions  . Hx: UTI (urinary tract infection)   . Hypercholesteremia   . Hypertension    stopped med during chemo /radiation - BP has been stable  . Hypokalemia 11/28/2013  . Hypothyroidism   . Incontinence of urine   . Migraine   . Numbness    "I have no feeling in my left leg" unknown cause  . Ovarian tumor    sees dr Lisbeth Renshaw ob-gyn yearly for evaluation  . Pulmonary embolism ([place]) 01/01/2012  / 11/28/2013  . Rectal cancer ([place]) 09/29/13   invasive adeocarcinoma  . Rectal cancer ([place])   . Rectal  mass   . Rosacea   . Severe sepsis with septic shock ([place]) 11/28/2013  . UTI (lower urinary tract infection) 01/23/2014    Social History  Substance Use Topics  . Smoking status: Never Smoker  . Smokeless tobacco: Never Used  . Alcohol use No    Family History  Problem Relation Age of Onset  . Brain cancer Mother   . Heart attack Father   . Diabetes Sister   . Heart disease Sister     Allergies  Allergen Reactions  . Cataflam [Diclofenac] Other (See Comments)    Unknown; patient is unaware of allergy.  . Cephalosporins Shortness Of Breath  . Erythromycin Shortness Of Breath  . Keflex [Cephalexin] Shortness Of Breath  . Propoxyphene     Unknown rxn  . Codeine Nausea And Vomiting  . Naproxen Other (See Comments)    Unknown; patient is unaware of allergy  . Penicillins Other (See Comments)    Patient states that she CAN take this; unaware of allergy, pt/family is unable to answer penicillin related questions as they do not recall this allergy reaction at all  . Synalgos-Dc [Aspirin-Caff-Dihydrocodeine] Other (See Comments)    Unknown; patient is unaware of allergy  . Ultram [Tramadol] Other (See Comments)    Unknown; patient is unaware of allergy    Objective: Vitals:   09/11/15 1414  Temp: 97.1 F (36.2 C)    TempSrc: Oral  Weight: 115 lb (52.2 kg)   Body mass index is 18.01 kg/m.  Physical Exam  Constitutional: She is oriented to person, place, and time.  Her weight is up 18 pounds since discharge. She is in good spirits. She is accompanied by her nephew.  Eyes: Conjunctivae are normal.  Cardiovascular: Normal rate and regular rhythm.   No murmur heard. Pulmonary/Chest: Breath sounds normal.  Abdominal: Soft. She exhibits no mass. There is no tenderness.  Neurological: She is alert and oriented to person, place, and time.  Skin: No rash noted.  Psychiatric: Mood and affect normal.    Lab Results    Problem List Items Addressed This Visit      Medium   C. difficile colitis    She's had dramatic improvement in her diarrhea on a slow vancomycin taper. She will complete therapy on 09/23/2015. I've told her that the major risk factor for a relapse would be other systemic antibiotic therapy. If she needs to be treated for other infections in the next 12 months I would recommend starting oral vancomycin 125 mg 4 times daily and continue it while she is on antibiotic therapy and for one week after. She can follow-up here as needed.      Relevant Medications   vancomycin (VANCOCIN) 125 MG capsule    Other Visit Diagnoses   None.      Michel Bickers, MD [facility] [phone] pager   [phone] cell 09/11/2015, 2:50 PM

## 2015-09-11 NOTE — Assessment & Plan Note (Signed)
She's had dramatic improvement in her diarrhea on a slow vancomycin taper. She will complete therapy on 09/23/2015. I've told her that the major risk factor for a relapse would be other systemic antibiotic therapy. If she needs to be treated for other infections in the next 12 months I would recommend starting oral vancomycin 125 mg 4 times daily and continue it while she is on antibiotic therapy and for one week after. She can follow-up here as needed.

## 2015-09-17 ENCOUNTER — Inpatient Hospital Stay: Payer: Medicare Other | Admitting: Oncology

## 2015-09-25 ENCOUNTER — Telehealth: Payer: Self-pay

## 2015-09-25 NOTE — Telephone Encounter (Signed)
Nephew called that biologics told him to call. Pt needs her xeloda refill to start next week.

## 2015-09-26 ENCOUNTER — Encounter: Payer: Self-pay | Admitting: *Deleted

## 2015-09-26 ENCOUNTER — Other Ambulatory Visit: Payer: Self-pay | Admitting: *Deleted

## 2015-09-26 DIAGNOSIS — C787 Secondary malignant neoplasm of liver and intrahepatic bile duct: Secondary | ICD-10-CM

## 2015-09-26 DIAGNOSIS — C20 Malignant neoplasm of rectum: Secondary | ICD-10-CM

## 2015-09-26 MED ORDER — CAPECITABINE 500 MG PO TABS: ORAL_TABLET | ORAL | 0 refills | Status: DC

## 2015-10-03 ENCOUNTER — Ambulatory Visit (HOSPITAL_BASED_OUTPATIENT_CLINIC_OR_DEPARTMENT_OTHER): Payer: Medicare Other | Admitting: Oncology

## 2015-10-03 ENCOUNTER — Other Ambulatory Visit (HOSPITAL_BASED_OUTPATIENT_CLINIC_OR_DEPARTMENT_OTHER): Payer: Medicare Other

## 2015-10-03 ENCOUNTER — Telehealth: Payer: Self-pay | Admitting: Oncology

## 2015-10-03 VITALS — BP 140/64 | HR 67 | Temp 97.4°F | Resp 18 | Ht 67.0 in | Wt 115.5 lb

## 2015-10-03 DIAGNOSIS — C20 Malignant neoplasm of rectum: Secondary | ICD-10-CM | POA: Diagnosis not present

## 2015-10-03 DIAGNOSIS — C787 Secondary malignant neoplasm of liver and intrahepatic bile duct: Secondary | ICD-10-CM

## 2015-10-03 DIAGNOSIS — C7800 Secondary malignant neoplasm of unspecified lung: Secondary | ICD-10-CM

## 2015-10-03 DIAGNOSIS — Z86711 Personal history of pulmonary embolism: Secondary | ICD-10-CM

## 2015-10-03 DIAGNOSIS — Z7901 Long term (current) use of anticoagulants: Secondary | ICD-10-CM

## 2015-10-03 LAB — COMPREHENSIVE METABOLIC PANEL
ALT: 16 U/L (ref 0–55)
AST: 28 U/L (ref 5–34)
Albumin: 3.5 g/dL (ref 3.5–5.0)
Alkaline Phosphatase: 207 U/L — ABNORMAL HIGH (ref 40–150)
Anion Gap: 8 mEq/L (ref 3–11)
BUN: 16.3 mg/dL (ref 7.0–26.0)
CALCIUM: 9.9 mg/dL (ref 8.4–10.4)
CHLORIDE: 101 meq/L (ref 98–109)
CO2: 31 meq/L — AB (ref 22–29)
CREATININE: 0.6 mg/dL (ref 0.6–1.1)
EGFR: 81 mL/min/{1.73_m2} — ABNORMAL LOW (ref >90)
Glucose: 107 mg/dl (ref 70–140)
POTASSIUM: 3.9 meq/L (ref 3.5–5.1)
SODIUM: 139 meq/L (ref 136–145)
Total Bilirubin: 0.83 mg/dL (ref 0.20–1.20)
Total Protein: 6.4 g/dL (ref 6.4–8.3)

## 2015-10-03 LAB — CBC WITH DIFFERENTIAL/PLATELET
BASO%: 0.9 % (ref 0.0–2.0)
BASOS ABS: 0 10*3/uL (ref 0.0–0.1)
EOS%: 1.4 % (ref 0.0–7.0)
Eosinophils Absolute: 0.1 10*3/uL (ref 0.0–0.5)
HEMATOCRIT: 35.4 % (ref 34.8–46.6)
HGB: 11.9 g/dL (ref 11.6–15.9)
LYMPH#: 0.3 10*3/uL — AB (ref 0.9–3.3)
LYMPH%: 6.1 % — AB (ref 14.0–49.7)
MCH: 33.9 pg (ref 25.1–34.0)
MCHC: 33.7 g/dL (ref 31.5–36.0)
MCV: 100.5 fL (ref 79.5–101.0)
MONO#: 0.4 10*3/uL (ref 0.1–0.9)
MONO%: 8 % (ref 0.0–14.0)
NEUT#: 4.1 10*3/uL (ref 1.5–6.5)
NEUT%: 83.6 % — AB (ref 38.4–76.8)
Platelets: 146 10*3/uL (ref 145–400)
RBC: 3.52 10*6/uL — AB (ref 3.70–5.45)
RDW: 14.5 % (ref 11.2–14.5)
WBC: 4.9 10*3/uL (ref 3.9–10.3)

## 2015-10-03 NOTE — Progress Notes (Signed)
Hematology and Oncology Follow Up Visit  Bethany [name] [id number] 01/04/1929 82 y.o. 10/03/2015 10:59 AM Bethany [name], MDKim, Bethany Rinks, MD   Principle Diagnosis: 82 year old woman with rectal adenocarcinoma diagnosed in August of 2015. Her staging by EUS his T3 N0. MRI of the liver showed potential hepatic metastasis.  Past therapy:  Radiation therapy concomitantly with oral Xeloda at 500 mg twice a day started on 10/25/2013. Therapy concluded in October 2015. She received total dose to 4320 cGy. Therapy interrupted due to recurrent hospitalizations. She is status post robotic-assisted rectosigmoid low anterior resection done on 05/17/2014. The pathology revealed a T3 N1 disease. PET/CT scan in July 2016 showed progresses metastatic disease in the liver and pulmonary nodules.   Current therapy:  Xeloda 1000 mg daily started in September 2016. She takes it 2 weeks on 1 week off per cycle. She is status post 3 cycles of therapy related to proceed with a third one. The dose was increased to 1500 mg daily starting on 01/04/2015.  Interim History:  Bethany [name] presents today for a followup visit with her son. Since the last visit, she reports feeling very well at this time. She reports that her diarrhea has resolved and no longer taking oral vancomycin. Her appetite is improving and her weight is relatively stable. She is ambulating without any major difficulty using a walker. She denied any falls or syncope. She denied any abdominal pain or any other GI symptoms.  She resumed Xeloda on 09/10/2015 and completed that cycle without new complications. She denied any hand-foot syndrome, GI complaints or oral ulcers. She has already started the following cycle this week.   She has not had any headaches or blurry vision. She does not report any syncope or seizures. She does not report any chest pain, shortness of breath, cough or hemoptysis. She does not report any palpitation, orthopnea or PND. She does not  report any leg edema. She denies abdominal pain, constipation or obstruction. She does not report any frequency urgency or hesitancy. She is not abort any skeletal complaints. Rest of the review of systems unremarkable.     Medications: I have reviewed the patient's current medications.  Current Outpatient Prescriptions  Medication Sig Dispense Refill  . acetaminophen (TYLENOL) 325 MG tablet Take 2 tablets (650 mg total) by mouth every 6 (six) hours as needed for mild pain (or Fever >/= 101).    . capecitabine (XELODA) 500 MG tablet Take 1 tablet in the morning and 2 tablets in the evening after meals for 14 days then 7 days off. 42 tablet 0  . cholecalciferol (VITAMIN D) 1000 UNITS tablet Take 2,000 Units by mouth daily.    . clobetasol cream (TEMOVATE) AB-123456789 % Apply 1 application topically 2 (two) times daily. 30 g 0  . feeding supplement (BOOST HIGH PROTEIN) LIQD Take 1 Container by mouth 3 (three) times daily as needed (appetite).    . feeding supplement, ENSURE ENLIVE, (ENSURE ENLIVE) LIQD Take 237 mLs by mouth 3 (three) times daily between meals. 123XX123 mL 12  . folic acid (FOLVITE) A999333 MCG tablet Take 800 mcg by mouth daily.    Marland Kitchen gabapentin (NEURONTIN) 100 MG capsule Take 1 capsule (100 mg total) by mouth 3 (three) times daily. 90 capsule 4  . levothyroxine (SYNTHROID, LEVOTHROID) 25 MCG tablet Take 25 mcg by mouth See admin instructions. 25 mcg  on Monday, Wednesday, Friday and Sunday.    . levothyroxine (SYNTHROID, LEVOTHROID) 75 MCG tablet Take 1 tablet by mouth See admin  instructions. 46mcg on Tuesday, Thursday, Saturday.    . magnesium chloride (SLOW-MAG) 64 MG TBEC SR tablet Take 2 tablets (128 mg total) by mouth daily. 60 tablet 0  . ondansetron (ZOFRAN) 8 MG tablet Take 1 tablet (8 mg total) by mouth every 8 (eight) hours as needed for nausea or vomiting. 20 tablet 0  . PEDIALYTE (PEDIALYTE) SOLN Take 240 mLs by mouth 3 (three) times daily as needed (poor appetite).    . Probiotic  Product (RISA-BID PROBIOTIC PO) Take 1 tablet by mouth 2 (two) times daily.    . prochlorperazine (COMPAZINE) 10 MG tablet Take 1 tablet (10 mg total) by mouth every 6 (six) hours as needed for nausea or vomiting. 30 tablet 0  . rivaroxaban (XARELTO) 20 MG TABS tablet Take 1 tablet (20 mg total) by mouth daily with supper. 30 tablet 0  . saccharomyces boulardii (FLORASTOR) 250 MG capsule Take 1 capsule (250 mg total) by mouth 2 (two) times daily. 60 capsule 0  . vitamin B-12 (CYANOCOBALAMIN) 1000 MCG tablet Place 2,000 mcg under the tongue daily.     No current facility-administered medications for this visit.      Allergies:  Allergies  Allergen Reactions  . Cataflam [Diclofenac] Other (See Comments)    Unknown; patient is unaware of allergy.  . Cephalosporins Shortness Of Breath  . Erythromycin Shortness Of Breath  . Keflex [Cephalexin] Shortness Of Breath  . Propoxyphene     Unknown rxn  . Codeine Nausea And Vomiting  . Naproxen Other (See Comments)    Unknown; patient is unaware of allergy  . Penicillins Other (See Comments)    Patient states that she CAN take this; unaware of allergy, pt/family is unable to answer penicillin related questions as they do not recall this allergy reaction at all  . Synalgos-Dc [Aspirin-Caff-Dihydrocodeine] Other (See Comments)    Unknown; patient is unaware of allergy  . Ultram [Tramadol] Other (See Comments)    Unknown; patient is unaware of allergy    Past Medical History, Surgical history, Social history, and Family History were reviewed and updated.   Physical Exam: Blood pressure 140/64, pulse 67, temperature 97.4 F (36.3 C), temperature source Oral, resp. rate 18, height 5\' 7"  (1.702 m), weight 115 lb 8 oz (52.4 kg), SpO2 100 %. ECOG: 2 General appearance: Elderly frail woman without distress. Head: Normocephalic, without obvious abnormality  No oral thrush noted. Neck: no adenopathy or thyroid masses. Lymph nodes: Cervical,  supraclavicular, and axillary nodes normal. Heart: Irregular without murmurs, rubs or gallops. Lung:chest clear, no wheezing, rales, normal symmetric air entry.  Abdomen: soft, non-tender, without masses or organomegaly. No shifting dullness or ascites. EXT: Trace edema noted bilaterally. Neurological: No ambulation difficulties. No deficits noted on exam.   Lab Results: Lab Results  Component Value Date   WBC 4.9 10/03/2015   HGB 11.9 10/03/2015   HCT 35.4 10/03/2015   MCV 100.5 10/03/2015   PLT 146 10/03/2015     Chemistry      Component Value Date/Time   NA 139 10/03/2015 1013   K 3.9 10/03/2015 1013   CL 104 08/11/2015 0524   CO2 31 (H) 10/03/2015 1013   BUN 16.3 10/03/2015 1013   CREATININE 0.6 10/03/2015 1013      Component Value Date/Time   CALCIUM 9.9 10/03/2015 1013   ALKPHOS 207 (H) 10/03/2015 1013   AST 28 10/03/2015 1013   ALT 16 10/03/2015 1013   BILITOT 0.83 10/03/2015 1013  Impression and Plan:  82 year old woman with the following issues:   1. Rectal cancer presented with hematochezia and status post colonoscopy and EUS. The care of by EUS criteria was 4 cm in length around 4-5 cm from the anal verge. The staging was T3 N0 M1 with possible liver metastasis. She is status post radiation therapy with oral Xeloda with still residual tumor as evident by the PET scan results.   She is status post surgical resection with a pathology revealed invasive adenocarcinoma through the muscularis propria. There are 2 extramural satellite tumor nodules. 12 lymph nodes were negative for metastatic cancer.  PET CT scan on 09/05/2014 showed progression of disease with disease in the liver and lung nodules.   PET scan in March 2017 showed mild progression on Xeloda but for the most part her disease under reasonable control. She has elected to continue Xeloda for the time being.  She resume Xeloda on 09/10/2015 after interruption because of C. difficile  colitis. The plan is to continue with the same dose and schedule and repeat imaging studies at the end of September 2017.     2. Hepatic masses. Liver directed therapy can be an option if progression noted on the next imaging study.  3. History of pulmonary embolism: She is on full dose anticoagulation with Xarelto. No bleeding or thrombosis episodes noted.  4. C. difficile colitis: She completed vancomycin oral therapy without complications. Diarrhea resolved.  5. Follow-up: Will be to 4 weeks to her CT scan.  [facility], MD 8/24/201710:59 AM

## 2015-10-03 NOTE — Telephone Encounter (Signed)
GAVE PATIENT AVS REPORT AND APPOINTMENTS FOR September. [facility] WILL CALL RE SCANS - PATIENT/SON AWARE.

## 2015-10-09 ENCOUNTER — Other Ambulatory Visit: Payer: Self-pay | Admitting: *Deleted

## 2015-10-09 DIAGNOSIS — C20 Malignant neoplasm of rectum: Secondary | ICD-10-CM

## 2015-10-09 DIAGNOSIS — C787 Secondary malignant neoplasm of liver and intrahepatic bile duct: Secondary | ICD-10-CM

## 2015-10-09 MED ORDER — CAPECITABINE 500 MG PO TABS: ORAL_TABLET | ORAL | 0 refills | Status: DC

## 2015-10-25 ENCOUNTER — Other Ambulatory Visit: Payer: Self-pay | Admitting: *Deleted

## 2015-10-25 DIAGNOSIS — C20 Malignant neoplasm of rectum: Secondary | ICD-10-CM

## 2015-10-25 DIAGNOSIS — C787 Secondary malignant neoplasm of liver and intrahepatic bile duct: Secondary | ICD-10-CM

## 2015-10-25 MED ORDER — CAPECITABINE 500 MG PO TABS: ORAL_TABLET | ORAL | 0 refills | Status: DC

## 2015-11-01 ENCOUNTER — Ambulatory Visit (HOSPITAL_COMMUNITY)
Admission: RE | Admit: 2015-11-01 | Discharge: 2015-11-01 | Disposition: A | Discharge status: Home / Self Care | Payer: Medicare Other | Source: Ambulatory Visit | Attending: Oncology | Admitting: Oncology

## 2015-11-01 ENCOUNTER — Ambulatory Visit (HOSPITAL_BASED_OUTPATIENT_CLINIC_OR_DEPARTMENT_OTHER): Payer: Medicare Other

## 2015-11-01 DIAGNOSIS — C7801 Secondary malignant neoplasm of right lung: Secondary | ICD-10-CM | POA: Diagnosis not present

## 2015-11-01 DIAGNOSIS — C20 Malignant neoplasm of rectum: Secondary | ICD-10-CM

## 2015-11-01 DIAGNOSIS — C7802 Secondary malignant neoplasm of left lung: Secondary | ICD-10-CM | POA: Insufficient documentation

## 2015-11-01 DIAGNOSIS — C787 Secondary malignant neoplasm of liver and intrahepatic bile duct: Secondary | ICD-10-CM | POA: Insufficient documentation

## 2015-11-01 DIAGNOSIS — N200 Calculus of kidney: Secondary | ICD-10-CM | POA: Diagnosis not present

## 2015-11-01 DIAGNOSIS — I700 Atherosclerosis of aorta: Secondary | ICD-10-CM | POA: Insufficient documentation

## 2015-11-01 LAB — CBC WITH DIFFERENTIAL/PLATELET
BASO%: 1 % (ref 0.0–2.0)
BASOS ABS: 0 10*3/uL (ref 0.0–0.1)
EOS%: 1 % (ref 0.0–7.0)
Eosinophils Absolute: 0 10*3/uL (ref 0.0–0.5)
HEMATOCRIT: 37.1 % (ref 34.8–46.6)
HEMOGLOBIN: 12.2 g/dL (ref 11.6–15.9)
LYMPH#: 0.3 10*3/uL — AB (ref 0.9–3.3)
LYMPH%: 7.3 % — ABNORMAL LOW (ref 14.0–49.7)
MCH: 33.2 pg (ref 25.1–34.0)
MCHC: 32.9 g/dL (ref 31.5–36.0)
MCV: 100.9 fL (ref 79.5–101.0)
MONO#: 0.3 10*3/uL (ref 0.1–0.9)
MONO%: 6.5 % (ref 0.0–14.0)
NEUT#: 3.5 10*3/uL (ref 1.5–6.5)
NEUT%: 84.2 % — ABNORMAL HIGH (ref 38.4–76.8)
Platelets: 157 10*3/uL (ref 145–400)
RBC: 3.68 10*6/uL — ABNORMAL LOW (ref 3.70–5.45)
RDW: 16.5 % — AB (ref 11.2–14.5)
WBC: 4.2 10*3/uL (ref 3.9–10.3)

## 2015-11-01 LAB — COMPREHENSIVE METABOLIC PANEL
ALBUMIN: 3.6 g/dL (ref 3.5–5.0)
ALT: 12 U/L (ref 0–55)
AST: 23 U/L (ref 5–34)
Alkaline Phosphatase: 174 U/L — ABNORMAL HIGH (ref 40–150)
Anion Gap: 10 mEq/L (ref 3–11)
BUN: 16.3 mg/dL (ref 7.0–26.0)
CHLORIDE: 100 meq/L (ref 98–109)
CO2: 31 mEq/L — ABNORMAL HIGH (ref 22–29)
CREATININE: 0.6 mg/dL (ref 0.6–1.1)
Calcium: 9.8 mg/dL (ref 8.4–10.4)
EGFR: 82 mL/min/{1.73_m2} — ABNORMAL LOW (ref >90)
GLUCOSE: 101 mg/dL (ref 70–140)
POTASSIUM: 3.4 meq/L — AB (ref 3.5–5.1)
SODIUM: 141 meq/L (ref 136–145)
Total Bilirubin: 1.06 mg/dL (ref 0.20–1.20)
Total Protein: 6.8 g/dL (ref 6.4–8.3)

## 2015-11-01 LAB — CEA (IN HOUSE-CHCC): CEA (CHCC-In House): 88.33 ng/mL — ABNORMAL HIGH (ref 0.00–5.00)

## 2015-11-01 MED ORDER — IOPAMIDOL (ISOVUE-300) INJECTION 61%
100.0000 mL | Freq: Once | INTRAVENOUS | Status: AC | PRN
  Administered 2015-11-01: 100 mL via INTRAVENOUS

## 2015-11-04 LAB — CEA

## 2015-11-05 ENCOUNTER — Telehealth: Payer: Self-pay | Admitting: Oncology

## 2015-11-05 ENCOUNTER — Ambulatory Visit (HOSPITAL_BASED_OUTPATIENT_CLINIC_OR_DEPARTMENT_OTHER): Payer: Medicare Other | Admitting: Oncology

## 2015-11-05 VITALS — BP 142/58 | HR 64 | Temp 97.5°F | Resp 18 | Ht 67.0 in | Wt 116.4 lb

## 2015-11-05 DIAGNOSIS — C20 Malignant neoplasm of rectum: Secondary | ICD-10-CM

## 2015-11-05 DIAGNOSIS — C787 Secondary malignant neoplasm of liver and intrahepatic bile duct: Secondary | ICD-10-CM

## 2015-11-05 DIAGNOSIS — Z86711 Personal history of pulmonary embolism: Secondary | ICD-10-CM

## 2015-11-05 DIAGNOSIS — C7800 Secondary malignant neoplasm of unspecified lung: Secondary | ICD-10-CM | POA: Diagnosis not present

## 2015-11-05 DIAGNOSIS — Z7901 Long term (current) use of anticoagulants: Secondary | ICD-10-CM | POA: Diagnosis not present

## 2015-11-05 LAB — CEA: CEA: 97.4 ng/mL — ABNORMAL HIGH (ref 0.0–4.7)

## 2015-11-05 NOTE — Telephone Encounter (Signed)
GAVE PATIENT AVS REPORT AND APPOINTMENTS FOR October  °

## 2015-11-05 NOTE — Progress Notes (Signed)
Hematology and Oncology Follow Up Visit  Bethany [name] [id number] April 05, 1928 82 y.o. 11/05/2015 10:10 AM Bethany [name], MDKim, [name], MD   Principle Diagnosis: 82 year old woman with rectal adenocarcinoma diagnosed in August of 2015. Her staging by EUS his T3 N0. MRI of the liver showed potential hepatic metastasis.  Past therapy:  Radiation therapy concomitantly with oral Xeloda at 500 mg twice a day started on 10/25/2013. Therapy concluded in October 2015. She received total dose to 4320 cGy. Therapy interrupted due to recurrent hospitalizations. She is status post robotic-assisted rectosigmoid low anterior resection done on 05/17/2014. The pathology revealed a T3 N1 disease. PET/CT scan in July 2016 showed progresses metastatic disease in the liver and pulmonary nodules.   Current therapy:  Xeloda 1000 mg daily started in September 2016. She takes it 2 weeks on 1 week off per cycle. She is status post 3 cycles of therapy related to proceed with a third one. The dose was increased to 1500 mg daily starting on 01/04/2015.  Interim History:  [name] presents today for a followup visit with her son. Since the last visit, she reports no major changes in her health. She denied any GI symptoms or diarrhea. Her C. difficile colitis of resolved. She did have an episode of diarrhea after drinking the contrast for her CT scan. Her appetite is improving and her weight is relatively stable. She is ambulating without any major difficulty using a walker. She denied any falls or syncope. She denied any abdominal pain or any other GI symptoms.  She continues to tolerate Xeloda without any major complications. She denied any complications such as hand-foot syndrome or infusion related issues.   She has not had any headaches or blurry vision. She does not report any syncope or seizures. She does not report any chest pain, shortness of breath, cough or hemoptysis. She does not report any palpitation, orthopnea or  PND. She does not report any leg edema. She denies abdominal pain, constipation or obstruction. She does not report any frequency urgency or hesitancy. She is not abort any skeletal complaints. Rest of the review of systems unremarkable.     Medications: I have reviewed the patient's current medications.  Current Outpatient Prescriptions  Medication Sig Dispense Refill  . acetaminophen (TYLENOL) 325 MG tablet Take 2 tablets (650 mg total) by mouth every 6 (six) hours as needed for mild pain (or Fever >/= 101).    . capecitabine (XELODA) 500 MG tablet Take 1 tablet in the morning and 2 tablets in the evening after meals for 14 days then 7 days off. 42 tablet 0  . cholecalciferol (VITAMIN D) 1000 UNITS tablet Take 2,000 Units by mouth daily.    . clobetasol cream (TEMOVATE) [id number] % Apply 1 application topically 2 (two) times daily. 30 g 0  . feeding supplement (BOOST HIGH PROTEIN) LIQD Take 1 Container by mouth 3 (three) times daily as needed (appetite).    . feeding supplement, ENSURE ENLIVE, (ENSURE ENLIVE) LIQD Take 237 mLs by mouth 3 (three) times daily between meals. 123XX123 mL 12  . folic acid (FOLVITE) A999333 MCG tablet Take 800 mcg by mouth daily.    Marland Kitchen gabapentin (NEURONTIN) 100 MG capsule Take 1 capsule (100 mg total) by mouth 3 (three) times daily. 90 capsule 4  . levothyroxine (SYNTHROID, LEVOTHROID) 25 MCG tablet Take 25 mcg by mouth See admin instructions. 25 mcg  on Monday, Wednesday, Friday and Sunday.    . levothyroxine (SYNTHROID, LEVOTHROID) 75 MCG tablet Take 1  tablet by mouth See admin instructions. 53mcg on Tuesday, Thursday, Saturday.    . magnesium chloride (SLOW-MAG) 64 MG TBEC SR tablet Take 2 tablets (128 mg total) by mouth daily. 60 tablet 0  . ondansetron (ZOFRAN) 8 MG tablet Take 1 tablet (8 mg total) by mouth every 8 (eight) hours as needed for nausea or vomiting. 20 tablet 0  . PEDIALYTE (PEDIALYTE) SOLN Take 240 mLs by mouth 3 (three) times daily as needed (poor appetite).     . Probiotic Product (RISA-BID PROBIOTIC PO) Take 1 tablet by mouth 2 (two) times daily.    . prochlorperazine (COMPAZINE) 10 MG tablet Take 1 tablet (10 mg total) by mouth every 6 (six) hours as needed for nausea or vomiting. 30 tablet 0  . rivaroxaban (XARELTO) 20 MG TABS tablet Take 1 tablet (20 mg total) by mouth daily with supper. 30 tablet 0  . saccharomyces boulardii (FLORASTOR) 250 MG capsule Take 1 capsule (250 mg total) by mouth 2 (two) times daily. 60 capsule 0  . vitamin B-12 (CYANOCOBALAMIN) 1000 MCG tablet Place 2,000 mcg under the tongue daily.     No current facility-administered medications for this visit.      Allergies:  Allergies  Allergen Reactions  . Cataflam [Diclofenac] Other (See Comments)    Unknown; patient is unaware of allergy.  . Cephalosporins Shortness Of Breath  . Erythromycin Shortness Of Breath  . Keflex [Cephalexin] Shortness Of Breath  . Propoxyphene     Unknown rxn  . Codeine Nausea And Vomiting  . Naproxen Other (See Comments)    Unknown; patient is unaware of allergy  . Penicillins Other (See Comments)    Patient states that she CAN take this; unaware of allergy, pt/family is unable to answer penicillin related questions as they do not recall this allergy reaction at all  . Synalgos-Dc [Aspirin-Caff-Dihydrocodeine] Other (See Comments)    Unknown; patient is unaware of allergy  . Ultram [Tramadol] Other (See Comments)    Unknown; patient is unaware of allergy    Past Medical History, Surgical history, Social history, and Family History were reviewed and updated.   Physical Exam: Blood pressure (!) 142/58, pulse 64, temperature 97.5 F (36.4 C), temperature source Oral, resp. rate 18, height 5\' 7"  (1.702 m), weight 116 lb 6.4 oz (52.8 kg), SpO2 100 %. ECOG: 2 General appearance: Alert, awake for a woman without distress. Head: Normocephalic, without obvious abnormality  No oral ulcers or lesions. Neck: no adenopathy or thyroid  masses. Lymph nodes: Cervical, supraclavicular, and axillary nodes normal. Heart: Irregular without murmurs, rubs or gallops. Lung:chest clear, no wheezing, rales, normal symmetric air entry.  Abdomen: soft, non-tender, without masses or organomegaly. No rebound or guarding. EXT: Trace edema noted bilaterally. Neurological: No ambulation difficulties. No deficits noted on exam.   Lab Results: Lab Results  Component Value Date   WBC 4.2 11/01/2015   HGB 12.2 11/01/2015   HCT 37.1 11/01/2015   MCV 100.9 11/01/2015   PLT 157 11/01/2015     Chemistry      Component Value Date/Time   NA 141 11/01/2015 1228   K 3.4 (L) 11/01/2015 1228   CL 104 08/11/2015 0524   CO2 31 (H) 11/01/2015 1228   BUN 16.3 11/01/2015 1228   CREATININE 0.6 11/01/2015 1228      Component Value Date/Time   CALCIUM 9.8 11/01/2015 1228   ALKPHOS 174 (H) 11/01/2015 1228   AST 23 11/01/2015 1228   ALT 12 11/01/2015 1228   BILITOT  1.06 11/01/2015 1228        EXAM: CT CHEST, ABDOMEN, AND PELVIS WITH CONTRAST  TECHNIQUE: Multidetector CT imaging of the chest, abdomen and pelvis was performed following the standard protocol during bolus administration of intravenous contrast.  CONTRAST:  130mL ISOVUE-300 IOPAMIDOL (ISOVUE-300) INJECTION 61%  COMPARISON:  AP CT on 08/06/2015, and PET-CT on 05/01/2015  FINDINGS: CT CHEST FINDINGS  Cardiovascular:  No significant abnormality.  Mediastinum/Lymph Nodes: No masses or pathologically enlarged lymph nodes identified.  Lungs/Pleura: Interval progression of bilateral pulmonary metastases seen compared to previous study. Index lesion in posterior medial right lower lobe measures 2.5 x 1.7 cm on image 98/4 compared to 1.7 x 1.3 cm on 08/06/2015 exam. Index lesion in posterior left lower lobe measures 1.9 x 1.6 cm on image 43/4 compared to 1.3 x 1.0 cm on 08/06/2015 exam. No evidence of pleural effusion.  Musculoskeletal/Soft Tissues: No  suspicious bone lesions or other significant abnormality.  CT ABDOMEN AND PELVIS FINDINGS  Hepatobiliary: Multiple hypovascular liver metastases also show increased in size compared to previous study. Largest metastasis in the central right hepatic lobe measures 8.3 x 6.0 cm on image 58/2 compared to 7.4 x 4.6 cm previously. Index mass in the lateral segment of left lobe measures 6.6 x 6.1 cm on image 59/2 compared to 5.3 x 4.8 cm previously. Portal hepatic veins remain patent. Gallbladder is unremarkable.  Pancreas:  No mass or inflammatory changes.  Spleen:  Within normal limits in size and appearance.  Adrenals/Urinary tract: No adrenal masses identified. Tiny nonobstructive renal calculi are again seen bilaterally. No evidence of ureteral calculi or hydronephrosis. Small renal cysts are again noted bilaterally. Unopacified urinary bladder is unremarkable in appearance.  Stomach/Bowel: No evidence of obstruction, inflammatory process, or abnormal fluid collections. Left lower quadrant colostomy again noted.  Vascular/Lymphatic: No pathologically enlarged lymph nodes identified. No abdominal aortic aneurysm. Aortic atherosclerosis.  Reproductive: Prior hysterectomy noted. Adnexal regions are unremarkable in appearance.  Other:  None.  Musculoskeletal:  No suspicious bone lesions identified.  IMPRESSION: Further mild increase in bilateral pulmonary metastases and diffuse liver metastases since prior study.  Tiny nonobstructive bilateral renal calculi. No evidence of ureteral calculi or hydronephrosis.  Aortic atherosclerosis.     Impression and Plan:  82 year old woman with the following issues:   1. Rectal cancer presented with hematochezia and status post colonoscopy and EUS. The care of by EUS criteria was 4 cm in length around 4-5 cm from the anal verge. The staging was T3 N0 M1 with possible liver metastasis. She is status post radiation  therapy with oral Xeloda with still residual tumor as evident by the PET scan results.   She is status post surgical resection with a pathology revealed invasive adenocarcinoma through the muscularis propria. There are 2 extramural satellite tumor nodules. 12 lymph nodes were negative for metastatic cancer.  PET CT scan on 09/05/2014 showed progression of disease with disease in the liver and lung nodules.   PET scan in March 2017 showed mild progression on Xeloda but for the most part her disease under reasonable control. She has elected to continue Xeloda for the time being.  She resume Xeloda on 09/10/2015 after interruption because of C. difficile colitis.   CT scan on 11/01/2015 was reviewed today and continues to show reasonably stable disease. She does have mild progression but no new lesions noted. This medication has not affected her quality of life and she is willing to continue at this time.   2. Hepatic  masses. Liver directed therapy can be an option in the future.  3. History of pulmonary embolism: She is on full dose anticoagulation with Xarelto. No bleeding or thrombosis episodes noted.  4. C. difficile colitis: She completed vancomycin oral therapy with resolution of her symptoms.  5. Follow-up: Will be to 4 weeks.   SHADAD,FIRAS, MD 9/26/201710:10 AM

## 2015-11-09 ENCOUNTER — Telehealth: Payer: Self-pay | Admitting: Internal Medicine

## 2015-11-09 NOTE — Telephone Encounter (Signed)
Correction: the pharmacy I called the vancomycin in was [facility] on [address] (not [facility]) Patient's son aware

## 2015-11-09 NOTE — Telephone Encounter (Signed)
Contacted by Juleen China, the patient's son regarding return of very watery diarrhea He reports she had a CT scan 8 days ago and had some loose stools after the contrast but stools then returned to normal per ostomy Over the last 24-36 hours she's developed what is now watery diarrhea occurring frequently. She's had some mild abdominal discomfort. No fevers that he is aware of nausea or vomiting Her stools have the very typical smell from prior episodes of C. difficile colitis She sees Dr. Benson Norway with gastroenterology, Dr. Megan Salon with infectious disease and Dr. Alen Blew with oncology  I'm going to refill vancomycin capsules 125 mg 4 times a day 14 days I've instructed that day resume Pedialyte and force fluids to try to maintain hydration If she feels worse or develops fever, worsening abdominal pain, blood in her ostomy or effluent then she needs to go to the ED immediately.  He voiced understanding Preferably would like to document C. difficile recurrence but no way to do so now over the weekend. Will defer to Dr. Benson Norway regarding repeating C. difficile PCR on Monday\  Rx called in verb[facility] to [facility] [address] (they do have capsules in stock)

## 2015-11-11 ENCOUNTER — Telehealth: Payer: Self-pay | Admitting: *Deleted

## 2015-11-11 NOTE — Telephone Encounter (Signed)
Received voice mail from nephew, Juleen China, that patient is being treated for C-Diff with Vancomycin. Does Dr. Alen Blew want her to start her next cycle of Xeloda tomorrow? Return number is [phone].

## 2015-11-12 ENCOUNTER — Telehealth: Payer: Self-pay | Admitting: *Deleted

## 2015-11-12 NOTE — Telephone Encounter (Signed)
Spoke with wallace the nephew, per dr Alen Blew, instructed him to hold xeloda until c-diff resolves.

## 2015-11-12 NOTE — Telephone Encounter (Signed)
Please let him know to hold Xeloda till C-diff is treated.

## 2015-11-22 ENCOUNTER — Other Ambulatory Visit: Payer: Self-pay | Admitting: *Deleted

## 2015-11-22 DIAGNOSIS — C787 Secondary malignant neoplasm of liver and intrahepatic bile duct: Secondary | ICD-10-CM

## 2015-11-22 DIAGNOSIS — C20 Malignant neoplasm of rectum: Secondary | ICD-10-CM

## 2015-11-22 MED ORDER — CAPECITABINE 500 MG PO TABS: ORAL_TABLET | ORAL | 0 refills | Status: DC

## 2015-12-03 ENCOUNTER — Ambulatory Visit (HOSPITAL_BASED_OUTPATIENT_CLINIC_OR_DEPARTMENT_OTHER): Payer: Medicare Other | Admitting: Oncology

## 2015-12-03 ENCOUNTER — Telehealth: Payer: Self-pay | Admitting: Oncology

## 2015-12-03 ENCOUNTER — Other Ambulatory Visit (HOSPITAL_BASED_OUTPATIENT_CLINIC_OR_DEPARTMENT_OTHER): Payer: Medicare Other

## 2015-12-03 VITALS — BP 160/69 | HR 98 | Temp 97.6°F | Resp 16 | Ht 67.0 in | Wt 118.5 lb

## 2015-12-03 DIAGNOSIS — C20 Malignant neoplasm of rectum: Secondary | ICD-10-CM | POA: Diagnosis not present

## 2015-12-03 DIAGNOSIS — C7800 Secondary malignant neoplasm of unspecified lung: Secondary | ICD-10-CM

## 2015-12-03 DIAGNOSIS — C787 Secondary malignant neoplasm of liver and intrahepatic bile duct: Secondary | ICD-10-CM

## 2015-12-03 DIAGNOSIS — Z86711 Personal history of pulmonary embolism: Secondary | ICD-10-CM

## 2015-12-03 DIAGNOSIS — Z7901 Long term (current) use of anticoagulants: Secondary | ICD-10-CM | POA: Diagnosis not present

## 2015-12-03 LAB — COMPREHENSIVE METABOLIC PANEL
ALBUMIN: 3.4 g/dL — AB (ref 3.5–5.0)
ALK PHOS: 242 U/L — AB (ref 40–150)
ALT: 20 U/L (ref 0–55)
AST: 36 U/L — ABNORMAL HIGH (ref 5–34)
Anion Gap: 8 mEq/L (ref 3–11)
BILIRUBIN TOTAL: 0.74 mg/dL (ref 0.20–1.20)
BUN: 18.5 mg/dL (ref 7.0–26.0)
CO2: 31 meq/L — AB (ref 22–29)
CREATININE: 0.6 mg/dL (ref 0.6–1.1)
Calcium: 9.8 mg/dL (ref 8.4–10.4)
Chloride: 100 mEq/L (ref 98–109)
EGFR: 81 mL/min/{1.73_m2} — ABNORMAL LOW (ref >90)
GLUCOSE: 91 mg/dL (ref 70–140)
Potassium: 3.7 mEq/L (ref 3.5–5.1)
SODIUM: 139 meq/L (ref 136–145)
TOTAL PROTEIN: 6.7 g/dL (ref 6.4–8.3)

## 2015-12-03 LAB — CBC WITH DIFFERENTIAL/PLATELET
BASO%: 0.6 % (ref 0.0–2.0)
Basophils Absolute: 0 10*3/uL (ref 0.0–0.1)
EOS ABS: 0.1 10*3/uL (ref 0.0–0.5)
EOS%: 1.8 % (ref 0.0–7.0)
HCT: 35.6 % (ref 34.8–46.6)
HEMOGLOBIN: 11.8 g/dL (ref 11.6–15.9)
LYMPH%: 7.3 % — AB (ref 14.0–49.7)
MCH: 34 pg (ref 25.1–34.0)
MCHC: 33.2 g/dL (ref 31.5–36.0)
MCV: 102.5 fL — ABNORMAL HIGH (ref 79.5–101.0)
MONO#: 0.4 10*3/uL (ref 0.1–0.9)
MONO%: 8 % (ref 0.0–14.0)
NEUT%: 82.3 % — ABNORMAL HIGH (ref 38.4–76.8)
NEUTROS ABS: 4.6 10*3/uL (ref 1.5–6.5)
PLATELETS: 217 10*3/uL (ref 145–400)
RBC: 3.47 10*6/uL — AB (ref 3.70–5.45)
RDW: 18 % — AB (ref 11.2–14.5)
WBC: 5.5 10*3/uL (ref 3.9–10.3)
lymph#: 0.4 10*3/uL — ABNORMAL LOW (ref 0.9–3.3)

## 2015-12-03 NOTE — Telephone Encounter (Signed)
Appointments scheduled per 10/24 LOS. Patient opted to have appointment earlier in the afternoon. AVS report and calendar of future scheduled appointments given to patient.

## 2015-12-03 NOTE — Progress Notes (Signed)
Hematology and Oncology Follow Up Visit  Bethany [name] [id number] [birth year] 82 y.o. 12/03/2015 3:31 PM [name], MDKim, [name], MD   Principle Diagnosis: 82 year old woman with rectal adenocarcinoma diagnosed in August of 2015. Her staging by EUS his T3 N0. MRI of the liver showed potential hepatic metastasis.  Past therapy:  Radiation therapy concomitantly with oral Xeloda at 500 mg twice a day started on 10/25/2013. Therapy concluded in October 2015. She received total dose to 4320 cGy. Therapy interrupted due to recurrent hospitalizations. She is status post robotic-assisted rectosigmoid low anterior resection done on 05/17/2014. The pathology revealed a T3 N1 disease. PET/CT scan in July 2016 showed progresses metastatic disease in the liver and pulmonary nodules.   Current therapy:  Xeloda 1000 mg daily started in September 2016. She takes it 2 weeks on 1 week off per cycle. She is status post 3 cycles of therapy related to proceed with a third one. The dose was increased to 1500 mg daily starting on 01/04/2015.  Interim History:  Bethany [name] presents today for a followup visit with her son. Since the last visit, she developed recurrence of C. difficile was treated with vancomycin. Her therapy concluded last week and she resumed Xeloda without complications. She reports that her diarrhea has resolved and no longer taking oral vancomycin. Her appetite is improving and her weight is relatively stable. She is ambulating without any major difficulty using a walker. She denied any falls or syncope. She is not reporting any abdominal pain or discomfort. She does report a minor increase in her lower extremity edema.  She does not report any new complications related to Xeloda. She does not report any worsening diarrhea or hand-foot syndrome.   She has not had any headaches or blurry vision. She does not report any syncope or seizures. She does not report any chest pain, shortness of breath, cough  or hemoptysis. She does not report any palpitation, orthopnea or PND. She does not report any leg edema. She denies abdominal pain, constipation or obstruction. She does not report any frequency urgency or hesitancy. She is not abort any skeletal complaints. Rest of the review of systems unremarkable.     Medications: I have reviewed the patient's current medications.  Current Outpatient Prescriptions  Medication Sig Dispense Refill  . acetaminophen (TYLENOL) 325 MG tablet Take 2 tablets (650 mg total) by mouth every 6 (six) hours as needed for mild pain (or Fever >/= 101).    . capecitabine (XELODA) 500 MG tablet Take 1 tablet in the morning and 2 tablets in the evening after meals for 14 days then 7 days off. 42 tablet 0  . cholecalciferol (VITAMIN D) 1000 UNITS tablet Take 2,000 Units by mouth daily.    . clobetasol cream (TEMOVATE) AB-123456789 % Apply 1 application topically 2 (two) times daily. 30 g 0  . feeding supplement (BOOST HIGH PROTEIN) LIQD Take 1 Container by mouth 3 (three) times daily as needed (appetite).    . feeding supplement, ENSURE ENLIVE, (ENSURE ENLIVE) LIQD Take 237 mLs by mouth 3 (three) times daily between meals. 123XX123 mL 12  . folic acid (FOLVITE) A999333 MCG tablet Take 800 mcg by mouth daily.    Marland Kitchen gabapentin (NEURONTIN) 100 MG capsule Take 1 capsule (100 mg total) by mouth 3 (three) times daily. 90 capsule 4  . levothyroxine (SYNTHROID, LEVOTHROID) 25 MCG tablet Take 25 mcg by mouth See admin instructions. 25 mcg  on Monday, Wednesday, Friday and Sunday.    . levothyroxine (  SYNTHROID, LEVOTHROID) 75 MCG tablet Take 1 tablet by mouth See admin instructions. 23mcg on Tuesday, Thursday, Saturday.    . magnesium chloride (SLOW-MAG) 64 MG TBEC SR tablet Take 2 tablets (128 mg total) by mouth daily. 60 tablet 0  . ondansetron (ZOFRAN) 8 MG tablet Take 1 tablet (8 mg total) by mouth every 8 (eight) hours as needed for nausea or vomiting. 20 tablet 0  . PEDIALYTE (PEDIALYTE) SOLN Take 240  mLs by mouth 3 (three) times daily as needed (poor appetite).    . Probiotic Product (RISA-BID PROBIOTIC PO) Take 1 tablet by mouth 2 (two) times daily.    . prochlorperazine (COMPAZINE) 10 MG tablet Take 1 tablet (10 mg total) by mouth every 6 (six) hours as needed for nausea or vomiting. 30 tablet 0  . rivaroxaban (XARELTO) 20 MG TABS tablet Take 1 tablet (20 mg total) by mouth daily with supper. 30 tablet 0  . saccharomyces boulardii (FLORASTOR) 250 MG capsule Take 1 capsule (250 mg total) by mouth 2 (two) times daily. 60 capsule 0  . vitamin B-12 (CYANOCOBALAMIN) 1000 MCG tablet Place 2,000 mcg under the tongue daily.     No current facility-administered medications for this visit.      Allergies:  Allergies  Allergen Reactions  . Cataflam [Diclofenac] Other (See Comments)    Unknown; patient is unaware of allergy.  . Cephalosporins Shortness Of Breath  . Erythromycin Shortness Of Breath  . Keflex [Cephalexin] Shortness Of Breath  . Propoxyphene     Unknown rxn  . Codeine Nausea And Vomiting  . Naproxen Other (See Comments)    Unknown; patient is unaware of allergy  . Penicillins Other (See Comments)    Patient states that she CAN take this; unaware of allergy, pt/family is unable to answer penicillin related questions as they do not recall this allergy reaction at all  . Synalgos-Dc [Aspirin-Caff-Dihydrocodeine] Other (See Comments)    Unknown; patient is unaware of allergy  . Ultram [Tramadol] Other (See Comments)    Unknown; patient is unaware of allergy    Past Medical History, Surgical history, Social history, and Family History were reviewed and updated.   Physical Exam: Blood pressure (!) 160/69, pulse 98, temperature 97.6 F (36.4 C), temperature source Oral, resp. rate 16, height 5\' 7"  (1.702 m), weight 118 lb 8 oz (53.8 kg), SpO2 100 %. ECOG: 2 General appearance: Alert, awake woman without distress. Head: Normocephalic, without obvious abnormality  No oral  ulcers or lesions. Neck: no adenopathy or thyroid masses. Lymph nodes: Cervical, supraclavicular, and axillary nodes normal. Heart: Irregular without murmurs, rubs or gallops. Lung:chest clear, no wheezing, rales, normal symmetric air entry.  Abdomen: soft, non-tender, without masses or organomegaly. No bad or guarding. EXT: Trace edema noted bilaterally. Neurological: No deficits noted.   Lab Results: Lab Results  Component Value Date   WBC 5.5 12/03/2015   HGB 11.8 12/03/2015   HCT 35.6 12/03/2015   MCV 102.5 (H) 12/03/2015   PLT 217 12/03/2015     Chemistry      Component Value Date/Time   NA 139 12/03/2015 1443   K 3.7 12/03/2015 1443   CL 104 08/11/2015 0524   CO2 31 (H) 12/03/2015 1443   BUN 18.5 12/03/2015 1443   CREATININE 0.6 12/03/2015 1443      Component Value Date/Time   CALCIUM 9.8 12/03/2015 1443   ALKPHOS 242 (H) 12/03/2015 1443   AST 36 (H) 12/03/2015 1443   ALT 20 12/03/2015 1443  BILITOT 0.74 12/03/2015 1443         Impression and Plan:  82 year old woman with the following issues:   1. Rectal cancer presented with hematochezia and status post colonoscopy and EUS. The care of by EUS criteria was 4 cm in length around 4-5 cm from the anal verge. The staging was T3 N0 M1 with possible liver metastasis. She is status post radiation therapy with oral Xeloda with still residual tumor as evident by the PET scan results.   She is status post surgical resection with a pathology revealed invasive adenocarcinoma through the muscularis propria. There are 2 extramural satellite tumor nodules. 12 lymph nodes were negative for metastatic cancer.  He develops systemic progression of her disease and currently on oral Xeloda. Her last CT scan in September 2017 showed minor progression but for the most part her disease under reasonable control. We have discussed risks and benefits of continuing Xeloda and she opted to continue for the time being.   2. Hepatic  masses. Liver directed therapy can be an option if progression noted on the next imaging study.  3. History of pulmonary embolism: She is on full dose anticoagulation with Xarelto. No bleeding or thrombosis episodes noted.  4. C. difficile colitis: She developed relapse recently and finished another course of vancomycin. She is asymptomatic at this time.  5. Follow-up: Will be to 4 weeks.   Zola Button, MD 10/24/20173:31 PM

## 2015-12-11 ENCOUNTER — Other Ambulatory Visit: Payer: Self-pay | Admitting: *Deleted

## 2015-12-11 DIAGNOSIS — C20 Malignant neoplasm of rectum: Secondary | ICD-10-CM

## 2015-12-11 DIAGNOSIS — C787 Secondary malignant neoplasm of liver and intrahepatic bile duct: Secondary | ICD-10-CM

## 2015-12-11 MED ORDER — CAPECITABINE 500 MG PO TABS: ORAL_TABLET | ORAL | 0 refills | Status: DC

## 2015-12-13 ENCOUNTER — Other Ambulatory Visit: Payer: Self-pay | Admitting: *Deleted

## 2015-12-13 DIAGNOSIS — C20 Malignant neoplasm of rectum: Secondary | ICD-10-CM

## 2015-12-13 DIAGNOSIS — C787 Secondary malignant neoplasm of liver and intrahepatic bile duct: Secondary | ICD-10-CM

## 2015-12-13 MED ORDER — CAPECITABINE 500 MG PO TABS: ORAL_TABLET | ORAL | 0 refills | Status: DC

## 2015-12-31 ENCOUNTER — Other Ambulatory Visit: Payer: Self-pay | Admitting: *Deleted

## 2015-12-31 DIAGNOSIS — C787 Secondary malignant neoplasm of liver and intrahepatic bile duct: Secondary | ICD-10-CM

## 2015-12-31 DIAGNOSIS — C20 Malignant neoplasm of rectum: Secondary | ICD-10-CM

## 2015-12-31 MED ORDER — CAPECITABINE 500 MG PO TABS: ORAL_TABLET | ORAL | 0 refills | Status: DC

## 2016-01-01 ENCOUNTER — Telehealth: Payer: Self-pay | Admitting: Oncology

## 2016-01-01 ENCOUNTER — Ambulatory Visit (HOSPITAL_BASED_OUTPATIENT_CLINIC_OR_DEPARTMENT_OTHER): Payer: Medicare Other | Admitting: Oncology

## 2016-01-01 ENCOUNTER — Other Ambulatory Visit (HOSPITAL_BASED_OUTPATIENT_CLINIC_OR_DEPARTMENT_OTHER): Payer: Medicare Other

## 2016-01-01 VITALS — BP 143/88 | HR 72 | Temp 98.2°F | Resp 18 | Wt 125.4 lb

## 2016-01-01 DIAGNOSIS — C787 Secondary malignant neoplasm of liver and intrahepatic bile duct: Secondary | ICD-10-CM

## 2016-01-01 DIAGNOSIS — Z7901 Long term (current) use of anticoagulants: Secondary | ICD-10-CM | POA: Diagnosis not present

## 2016-01-01 DIAGNOSIS — R609 Edema, unspecified: Secondary | ICD-10-CM | POA: Diagnosis not present

## 2016-01-01 DIAGNOSIS — Z86711 Personal history of pulmonary embolism: Secondary | ICD-10-CM

## 2016-01-01 DIAGNOSIS — C20 Malignant neoplasm of rectum: Secondary | ICD-10-CM

## 2016-01-01 LAB — CBC WITH DIFFERENTIAL/PLATELET
BASO%: 0.5 % (ref 0.0–2.0)
Basophils Absolute: 0 10*3/uL (ref 0.0–0.1)
EOS%: 0.6 % (ref 0.0–7.0)
Eosinophils Absolute: 0 10*3/uL (ref 0.0–0.5)
HEMATOCRIT: 34.7 % — AB (ref 34.8–46.6)
HEMOGLOBIN: 11.5 g/dL — AB (ref 11.6–15.9)
LYMPH#: 0.4 10*3/uL — AB (ref 0.9–3.3)
LYMPH%: 5.7 % — ABNORMAL LOW (ref 14.0–49.7)
MCH: 34.3 pg — ABNORMAL HIGH (ref 25.1–34.0)
MCHC: 33.1 g/dL (ref 31.5–36.0)
MCV: 103.6 fL — ABNORMAL HIGH (ref 79.5–101.0)
MONO#: 0.5 10*3/uL (ref 0.1–0.9)
MONO%: 7.4 % (ref 0.0–14.0)
NEUT%: 85.8 % — ABNORMAL HIGH (ref 38.4–76.8)
NEUTROS ABS: 5.6 10*3/uL (ref 1.5–6.5)
PLATELETS: 214 10*3/uL (ref 145–400)
RBC: 3.35 10*6/uL — AB (ref 3.70–5.45)
RDW: 15.8 % — AB (ref 11.2–14.5)
WBC: 6.5 10*3/uL (ref 3.9–10.3)

## 2016-01-01 LAB — COMPREHENSIVE METABOLIC PANEL
ALT: 16 U/L (ref 0–55)
ANION GAP: 9 meq/L (ref 3–11)
AST: 28 U/L (ref 5–34)
Albumin: 3.4 g/dL — ABNORMAL LOW (ref 3.5–5.0)
Alkaline Phosphatase: 245 U/L — ABNORMAL HIGH (ref 40–150)
BILIRUBIN TOTAL: 0.95 mg/dL (ref 0.20–1.20)
BUN: 20.4 mg/dL (ref 7.0–26.0)
CALCIUM: 9.9 mg/dL (ref 8.4–10.4)
CO2: 29 meq/L (ref 22–29)
CREATININE: 0.6 mg/dL (ref 0.6–1.1)
Chloride: 101 mEq/L (ref 98–109)
EGFR: 83 mL/min/{1.73_m2} — ABNORMAL LOW (ref >90)
Glucose: 96 mg/dl (ref 70–140)
Potassium: 3.6 mEq/L (ref 3.5–5.1)
Sodium: 139 mEq/L (ref 136–145)
TOTAL PROTEIN: 6.7 g/dL (ref 6.4–8.3)

## 2016-01-01 LAB — CEA (IN HOUSE-CHCC): CEA (CHCC-In House): 125.75 ng/mL — ABNORMAL HIGH (ref 0.00–5.00)

## 2016-01-01 NOTE — Progress Notes (Signed)
Hematology and Oncology Follow Up Visit  Bethany [name] [id number] 1928-04-27 82 y.o. 01/01/2016 12:52 PM Bethany [name], MDKim, Bethany Rinks, MD   Principle Diagnosis: 82 year old woman with rectal adenocarcinoma diagnosed in August of 2015. Her staging by EUS his T3 N0. MRI of the liver showed potential hepatic metastasis.  Past therapy:  Radiation therapy concomitantly with oral Xeloda at 500 mg twice a day started on 10/25/2013. Therapy concluded in October 2015. She received total dose to 4320 cGy. Therapy interrupted due to recurrent hospitalizations. She is status post robotic-assisted rectosigmoid low anterior resection done on 05/17/2014. The pathology revealed a T3 N1 disease. PET/CT scan in July 2016 showed progresses metastatic disease in the liver and pulmonary nodules.   Current therapy:  Xeloda 1000 mg daily started in September 2016. She takes it 2 weeks on 1 week off per cycle. She is status post 3 cycles of therapy related to proceed with a third one. The dose was increased to 1500 mg daily starting on 01/04/2015.  Interim History:  Bethany [name] presents today for a followup visit with her son. Since the last visit, she reports feeling better with resolution of her C. difficile. Her appetite has improved and increase her oral intake.She resumed Xeloda without complications. She reports no nausea, vomiting or diarrhea. She denied any hand-foot syndrome.Marland Kitchen Her appetite is improving and her weight is as well. She is ambulating without any major difficulty using a walker. She denied any falls or syncope. She is not reporting any abdominal pain or discomfort.   She does report worsening lower extremity edema and does not take any diuretics. She denied any history of renal or cardiac complications. She tries to keep her feet elevated during the day.   She has not had any headaches or blurry vision. She does not report any syncope or seizures. She does not report any chest pain, shortness of breath,  cough or hemoptysis. She does not report any palpitation, orthopnea or PND. She does not report any leg edema. She denies abdominal pain, constipation or obstruction. She does not report any frequency urgency or hesitancy. She is not abort any skeletal complaints. Rest of the review of systems unremarkable.     Medications: I have reviewed the patient's current medications.  Current Outpatient Prescriptions  Medication Sig Dispense Refill  . acetaminophen (TYLENOL) 325 MG tablet Take 2 tablets (650 mg total) by mouth every 6 (six) hours as needed for mild pain (or Fever >/= 101).    . capecitabine (XELODA) 500 MG tablet Take 1 tablet in the morning and 2 tablets in the evening after meals for 14 days then 7 days off. 42 tablet 0  . cholecalciferol (VITAMIN D) 1000 UNITS tablet Take 2,000 Units by mouth daily.    . clobetasol cream (TEMOVATE) AB-123456789 % Apply 1 application topically 2 (two) times daily. 30 g 0  . feeding supplement (BOOST HIGH PROTEIN) LIQD Take 1 Container by mouth 3 (three) times daily as needed (appetite).    . feeding supplement, ENSURE ENLIVE, (ENSURE ENLIVE) LIQD Take 237 mLs by mouth 3 (three) times daily between meals. 123XX123 mL 12  . folic acid (FOLVITE) A999333 MCG tablet Take 800 mcg by mouth daily.    Marland Kitchen gabapentin (NEURONTIN) 100 MG capsule Take 1 capsule (100 mg total) by mouth 3 (three) times daily. 90 capsule 4  . levothyroxine (SYNTHROID, LEVOTHROID) 25 MCG tablet Take 25 mcg by mouth See admin instructions. 25 mcg  on Monday, Wednesday, Friday and Sunday.    Marland Kitchen  levothyroxine (SYNTHROID, LEVOTHROID) 75 MCG tablet Take 1 tablet by mouth See admin instructions. 20mcg on Tuesday, Thursday, Saturday.    . magnesium chloride (SLOW-MAG) 64 MG TBEC SR tablet Take 2 tablets (128 mg total) by mouth daily. 60 tablet 0  . ondansetron (ZOFRAN) 8 MG tablet Take 1 tablet (8 mg total) by mouth every 8 (eight) hours as needed for nausea or vomiting. 20 tablet 0  . PEDIALYTE (PEDIALYTE) SOLN  Take 240 mLs by mouth 3 (three) times daily as needed (poor appetite).    . Probiotic Product (RISA-BID PROBIOTIC PO) Take 1 tablet by mouth 2 (two) times daily.    . prochlorperazine (COMPAZINE) 10 MG tablet Take 1 tablet (10 mg total) by mouth every 6 (six) hours as needed for nausea or vomiting. 30 tablet 0  . rivaroxaban (XARELTO) 20 MG TABS tablet Take 1 tablet (20 mg total) by mouth daily with supper. 30 tablet 0  . saccharomyces boulardii (FLORASTOR) 250 MG capsule Take 1 capsule (250 mg total) by mouth 2 (two) times daily. 60 capsule 0  . vitamin B-12 (CYANOCOBALAMIN) 1000 MCG tablet Place 2,000 mcg under the tongue daily.     No current facility-administered medications for this visit.      Allergies:  Allergies  Allergen Reactions  . Cataflam [Diclofenac] Other (See Comments)    Unknown; patient is unaware of allergy.  . Cephalosporins Shortness Of Breath  . Erythromycin Shortness Of Breath  . Keflex [Cephalexin] Shortness Of Breath  . Propoxyphene     Unknown rxn  . Codeine Nausea And Vomiting  . Naproxen Other (See Comments)    Unknown; patient is unaware of allergy  . Penicillins Other (See Comments)    Patient states that she CAN take this; unaware of allergy, pt/family is unable to answer penicillin related questions as they do not recall this allergy reaction at all  . Synalgos-Dc [Aspirin-Caff-Dihydrocodeine] Other (See Comments)    Unknown; patient is unaware of allergy  . Ultram [Tramadol] Other (See Comments)    Unknown; patient is unaware of allergy    Past Medical History, Surgical history, Social history, and Family History were reviewed and updated.   Physical Exam: Blood pressure (!) 143/88, pulse 72, temperature 98.2 F (36.8 C), temperature source Oral, resp. rate 18, weight 125 lb 6.4 oz (56.9 kg), SpO2 100 %. ECOG: 2 General appearance: Chronically ill-appearing woman without distress. Head: Normocephalic, without obvious abnormality  No oral  thrush noted. Neck: no adenopathy or thyroid masses. Lymph nodes: Cervical, supraclavicular, and axillary nodes normal. Heart: Irregular without murmurs, rubs or gallops. Lung:chest clear, no wheezing, rales, normal symmetric air entry.  Abdomen: soft, non-tender, without masses or organomegaly. No splenomegaly noted. EXT: 2+ lower extremity edema noted bilaterally. Neurological: No deficits noted.   Lab Results: Lab Results  Component Value Date   WBC 6.5 01/01/2016   HGB 11.5 (L) 01/01/2016   HCT 34.7 (L) 01/01/2016   MCV 103.6 (H) 01/01/2016   PLT 214 01/01/2016     Chemistry      Component Value Date/Time   NA 139 12/03/2015 1443   K 3.7 12/03/2015 1443   CL 104 08/11/2015 0524   CO2 31 (H) 12/03/2015 1443   BUN 18.5 12/03/2015 1443   CREATININE 0.6 12/03/2015 1443      Component Value Date/Time   CALCIUM 9.8 12/03/2015 1443   ALKPHOS 242 (H) 12/03/2015 1443   AST 36 (H) 12/03/2015 1443   ALT 20 12/03/2015 1443   BILITOT 0.74  12/03/2015 1443         Impression and Plan:  82 year old woman with the following issues:   1. Rectal cancer presented with hematochezia and status post colonoscopy and EUS. The care of by EUS criteria was 4 cm in length around 4-5 cm from the anal verge. The staging was T3 N0 M1 with possible liver metastasis. She is status post radiation therapy with oral Xeloda with still residual tumor as evident by the PET scan results.   She is status post surgical resection with a pathology revealed invasive adenocarcinoma through the muscularis propria. There are 2 extramural satellite tumor nodules. 12 lymph nodes were negative for metastatic cancer.  He develops systemic progression of her disease and currently on oral Xeloda. Her last CT scan in September 2017 showed minor progression but for the most part her disease under reasonable control.   The plan is to continue with the same dose and schedule of Xeloda given the lack of alternative  options. She is not a candidate for multiagent systemic chemotherapy. We will repeat imaging studies in January 2018.   2. Hepatic masses. Liver directed therapy can be an option if progression noted on the next imaging study.  3. History of pulmonary embolism: She is on full dose anticoagulation with Xarelto. No bleeding or thrombosis episodes noted.  4. Lower extremity edema: Likely related to poor nutritional status and low albumin. We have discussed different strategies to boost her oral intake of protein as well as strategies to elevate her feet to decrease her edema.  5. Follow-up: Will be to 4 weeks.   Zola Button, MD 11/22/201712:52 PM

## 2016-01-01 NOTE — Telephone Encounter (Signed)
Appointments scheduled per 11/22 LOS. Patient given AVS report and calendars with future scheduled appointments. °

## 2016-01-02 LAB — CEA: CEA1: 131 ng/mL — AB (ref 0.0–4.7)

## 2016-01-22 ENCOUNTER — Other Ambulatory Visit: Payer: Self-pay | Admitting: *Deleted

## 2016-01-22 DIAGNOSIS — C20 Malignant neoplasm of rectum: Secondary | ICD-10-CM

## 2016-01-22 DIAGNOSIS — C787 Secondary malignant neoplasm of liver and intrahepatic bile duct: Secondary | ICD-10-CM

## 2016-01-22 MED ORDER — CAPECITABINE 500 MG PO TABS: ORAL_TABLET | ORAL | 0 refills | Status: DC

## 2016-01-31 ENCOUNTER — Telehealth: Payer: Self-pay | Admitting: Oncology

## 2016-01-31 ENCOUNTER — Ambulatory Visit (HOSPITAL_BASED_OUTPATIENT_CLINIC_OR_DEPARTMENT_OTHER): Payer: Medicare Other | Admitting: Oncology

## 2016-01-31 ENCOUNTER — Other Ambulatory Visit (HOSPITAL_BASED_OUTPATIENT_CLINIC_OR_DEPARTMENT_OTHER): Payer: Medicare Other

## 2016-01-31 VITALS — BP 168/52 | HR 71 | Temp 97.4°F | Resp 17 | Wt 129.3 lb

## 2016-01-31 DIAGNOSIS — Z86711 Personal history of pulmonary embolism: Secondary | ICD-10-CM | POA: Diagnosis not present

## 2016-01-31 DIAGNOSIS — C787 Secondary malignant neoplasm of liver and intrahepatic bile duct: Secondary | ICD-10-CM | POA: Diagnosis not present

## 2016-01-31 DIAGNOSIS — R600 Localized edema: Secondary | ICD-10-CM | POA: Diagnosis not present

## 2016-01-31 DIAGNOSIS — C20 Malignant neoplasm of rectum: Secondary | ICD-10-CM

## 2016-01-31 LAB — CBC WITH DIFFERENTIAL/PLATELET
BASO%: 0.2 % (ref 0.0–2.0)
BASOS ABS: 0 10*3/uL (ref 0.0–0.1)
EOS ABS: 0.1 10*3/uL (ref 0.0–0.5)
EOS%: 0.8 % (ref 0.0–7.0)
HCT: 34.5 % — ABNORMAL LOW (ref 34.8–46.6)
HEMOGLOBIN: 11.4 g/dL — AB (ref 11.6–15.9)
LYMPH%: 7.1 % — AB (ref 14.0–49.7)
MCH: 34.8 pg — AB (ref 25.1–34.0)
MCHC: 33 g/dL (ref 31.5–36.0)
MCV: 105.2 fL — AB (ref 79.5–101.0)
MONO#: 0.5 10*3/uL (ref 0.1–0.9)
MONO%: 8.3 % (ref 0.0–14.0)
NEUT%: 83.6 % — ABNORMAL HIGH (ref 38.4–76.8)
NEUTROS ABS: 5.2 10*3/uL (ref 1.5–6.5)
PLATELETS: 181 10*3/uL (ref 145–400)
RBC: 3.28 10*6/uL — ABNORMAL LOW (ref 3.70–5.45)
RDW: 17.3 % — ABNORMAL HIGH (ref 11.2–14.5)
WBC: 6.2 10*3/uL (ref 3.9–10.3)
lymph#: 0.4 10*3/uL — ABNORMAL LOW (ref 0.9–3.3)

## 2016-01-31 LAB — COMPREHENSIVE METABOLIC PANEL
ALT: 16 U/L (ref 0–55)
AST: 32 U/L (ref 5–34)
Albumin: 3.4 g/dL — ABNORMAL LOW (ref 3.5–5.0)
Alkaline Phosphatase: 214 U/L — ABNORMAL HIGH (ref 40–150)
Anion Gap: 8 mEq/L (ref 3–11)
BUN: 20.4 mg/dL (ref 7.0–26.0)
CHLORIDE: 102 meq/L (ref 98–109)
CO2: 31 meq/L — AB (ref 22–29)
Calcium: 9.5 mg/dL (ref 8.4–10.4)
Creatinine: 0.6 mg/dL (ref 0.6–1.1)
EGFR: 84 mL/min/{1.73_m2} — AB (ref >90)
GLUCOSE: 93 mg/dL (ref 70–140)
POTASSIUM: 3 meq/L — AB (ref 3.5–5.1)
SODIUM: 141 meq/L (ref 136–145)
Total Bilirubin: 0.97 mg/dL (ref 0.20–1.20)
Total Protein: 6.3 g/dL — ABNORMAL LOW (ref 6.4–8.3)

## 2016-01-31 LAB — CEA (IN HOUSE-CHCC): CEA (CHCC-In House): 162.59 ng/mL — ABNORMAL HIGH (ref 0.00–5.00)

## 2016-01-31 NOTE — Telephone Encounter (Signed)
Appointments scheduled per 12/22 LOS. Patient given AVS report and calendars with future scheduled appointments. Patient aware of PET scan to be scheduled.

## 2016-01-31 NOTE — Progress Notes (Signed)
Hematology and Oncology Follow Up Visit  Bethany [name] [id number] 11-Apr-1928 82 y.o. 01/31/2016 3:18 PM Bethany [name], MDKim, Bethany Rinks, MD   Principle Diagnosis: 83 year old woman with rectal adenocarcinoma diagnosed in August of 2015. Her staging by EUS his T3 N0. MRI of the liver showed potential hepatic metastasis.  Past therapy:  Radiation therapy concomitantly with oral Xeloda at 500 mg twice a day started on 10/25/2013. Therapy concluded in October 2015. She received total dose to 4320 cGy. Therapy interrupted due to recurrent hospitalizations. She is status post robotic-assisted rectosigmoid low anterior resection done on 05/17/2014. The pathology revealed a T3 N1 disease. PET/CT scan in July 2016 showed progresses metastatic disease in the liver and pulmonary nodules.   Current therapy:  Xeloda 1000 mg daily started in September 2016. She takes it 2 weeks on 1 week off per cycle. She is status post 3 cycles of therapy related to proceed with a third one. The dose was increased to 1500 mg daily starting on 01/04/2015.  Interim History:  Bethany [name] presents today for a followup visit with her son. Since the last visit, she reports increase in her lower extremity edema and was prescribed Lasix by Dr. Maudie Mercury. Lasix helped with her edema although she continues to have bilateral lower extremity edema. He is currently taking Xeloda without any new complications.   She reports no nausea, vomiting or diarrhea. She denied any hand-foot syndrome. Her appetite is improving and her weight improved. Although her weight has increased related to lower show any swelling. She is ambulating without any major difficulty using a walker. She denied any falls or syncope. She is not reporting any abdominal pain or discomfort. Her quality of life has not dramatically changed at this time.   She has not had any headaches or blurry vision. She does not report any syncope or seizures. She does not report any chest pain,  shortness of breath, cough or hemoptysis. She does not report any palpitation, orthopnea or PND. She denies abdominal pain, constipation or obstruction. She does not report any frequency urgency or hesitancy. She is not abort any skeletal complaints. Rest of the review of systems unremarkable.     Medications: I have reviewed the patient's current medications.  Current Outpatient Prescriptions  Medication Sig Dispense Refill  . acetaminophen (TYLENOL) 325 MG tablet Take 2 tablets (650 mg total) by mouth every 6 (six) hours as needed for mild pain (or Fever >/= 101).    . capecitabine (XELODA) 500 MG tablet Take 1 tablet in the morning and 2 tablets in the evening after meals for 14 days then 7 days off. 42 tablet 0  . cholecalciferol (VITAMIN D) 1000 UNITS tablet Take 2,000 Units by mouth daily.    . clobetasol cream (TEMOVATE) [id number] % Apply 1 application topically 2 (two) times daily. 30 g 0  . feeding supplement (BOOST HIGH PROTEIN) LIQD Take 1 Container by mouth 3 (three) times daily as needed (appetite).    . feeding supplement, ENSURE ENLIVE, (ENSURE ENLIVE) LIQD Take 237 mLs by mouth 3 (three) times daily between meals. 123XX123 mL 12  . folic acid (FOLVITE) A999333 MCG tablet Take 800 mcg by mouth daily.    . furosemide (LASIX) 20 MG tablet Take 1 tablet by mouth daily.  0  . gabapentin (NEURONTIN) 100 MG capsule Take 1 capsule (100 mg total) by mouth 3 (three) times daily. 90 capsule 4  . levothyroxine (SYNTHROID, LEVOTHROID) 25 MCG tablet Take 25 mcg by mouth See admin instructions.  25 mcg  on Monday, Wednesday, Friday and Sunday.    . levothyroxine (SYNTHROID, LEVOTHROID) 75 MCG tablet Take 1 tablet by mouth See admin instructions. 30mcg on Tuesday, Thursday, Saturday.    . magnesium chloride (SLOW-MAG) 64 MG TBEC SR tablet Take 2 tablets (128 mg total) by mouth daily. 60 tablet 0  . ondansetron (ZOFRAN) 8 MG tablet Take 1 tablet (8 mg total) by mouth every 8 (eight) hours as needed for nausea or  vomiting. 20 tablet 0  . PEDIALYTE (PEDIALYTE) SOLN Take 240 mLs by mouth 3 (three) times daily as needed (poor appetite).    . Probiotic Product (RISA-BID PROBIOTIC PO) Take 1 tablet by mouth 2 (two) times daily.    . prochlorperazine (COMPAZINE) 10 MG tablet Take 1 tablet (10 mg total) by mouth every 6 (six) hours as needed for nausea or vomiting. 30 tablet 0  . rivaroxaban (XARELTO) 20 MG TABS tablet Take 1 tablet (20 mg total) by mouth daily with supper. 30 tablet 0  . saccharomyces boulardii (FLORASTOR) 250 MG capsule Take 1 capsule (250 mg total) by mouth 2 (two) times daily. 60 capsule 0  . vitamin B-12 (CYANOCOBALAMIN) 1000 MCG tablet Place 2,000 mcg under the tongue daily.     No current facility-administered medications for this visit.      Allergies:  Allergies  Allergen Reactions  . Cataflam [Diclofenac] Other (See Comments)    Unknown; patient is unaware of allergy.  . Cephalosporins Shortness Of Breath  . Erythromycin Shortness Of Breath  . Keflex [Cephalexin] Shortness Of Breath  . Propoxyphene     Unknown rxn  . Codeine Nausea And Vomiting  . Naproxen Other (See Comments)    Unknown; patient is unaware of allergy  . Penicillins Other (See Comments)    Patient states that she CAN take this; unaware of allergy, pt/family is unable to answer penicillin related questions as they do not recall this allergy reaction at all  . Synalgos-Dc [Aspirin-Caff-Dihydrocodeine] Other (See Comments)    Unknown; patient is unaware of allergy  . Ultram [Tramadol] Other (See Comments)    Unknown; patient is unaware of allergy    Past Medical History, Surgical history, Social history, and Family History were reviewed and updated.   Physical Exam: Blood pressure (!) 168/52, pulse 71, temperature 97.4 F (36.3 C), temperature source Oral, resp. rate 17, weight 129 lb 4.8 oz (58.7 kg), SpO2 100 %. ECOG: 2 General appearance: Frail, elderly woman without distress. Head: Normocephalic,  without obvious abnormality  No oral ulcers or lesions. Neck: no adenopathy or thyroid masses. Lymph nodes: Cervical, supraclavicular, and axillary nodes normal. Heart: Irregular without murmurs, rubs or gallops. Lung:chest clear, no wheezing, rales, normal symmetric air entry.  Abdomen: soft, non-tender, without masses or organomegaly. No rebound or guarding. EXT: 2+ lower extremity edema noted bilaterally. Neurological: No deficits noted.   Lab Results: Lab Results  Component Value Date   WBC 6.2 01/31/2016   HGB 11.4 (L) 01/31/2016   HCT 34.5 (L) 01/31/2016   MCV 105.2 (H) 01/31/2016   PLT 181 01/31/2016     Chemistry      Component Value Date/Time   NA 139 01/01/2016 1208   K 3.6 01/01/2016 1208   CL 104 08/11/2015 0524   CO2 29 01/01/2016 1208   BUN 20.4 01/01/2016 1208   CREATININE 0.6 01/01/2016 1208      Component Value Date/Time   CALCIUM 9.9 01/01/2016 1208   ALKPHOS 245 (H) 01/01/2016 1208   AST  28 01/01/2016 1208   ALT 16 01/01/2016 1208   BILITOT 0.95 01/01/2016 1208         Impression and Plan:  82 year old woman with the following issues:   1. Rectal cancer presented with hematochezia and status post colonoscopy and EUS. The care of by EUS criteria was 4 cm in length around 4-5 cm from the anal verge. The staging was T3 N0 M1 with possible liver metastasis. She is status post radiation therapy with oral Xeloda with still residual tumor as evident by the PET scan results.   She is status post surgical resection with a pathology revealed invasive adenocarcinoma through the muscularis propria. There are 2 extramural satellite tumor nodules. 12 lymph nodes were negative for metastatic cancer.  He develops systemic progression of her disease and currently on oral Xeloda. Her last CT scan in September 2017 showed minor progression but for the most part her disease under reasonable control.   Risks and benefits of continuing this therapy were reviewed, she  is agreeable to continue Xeloda we will repeat imaging studies in January 2018.   2. Hepatic masses. Liver directed therapy can be an option if progression noted on the next imaging study.  3. History of pulmonary embolism: She is on full dose anticoagulation with Xarelto. She denied any recent bleeding complications.  4. Lower extremity edema: Likely related to poor nutritional status and low albumin. We have discussed different strategies to boost her oral intake of protein as well as strategies to elevate her feet to decrease her edema. She is also likely to be back on Lasix per the recommendation of Dr. Maudie Mercury.  5. Follow-up: Will be to 4 weeks.   Zola Button, MD 12/22/20173:18 PM

## 2016-02-12 ENCOUNTER — Other Ambulatory Visit: Payer: Self-pay | Admitting: *Deleted

## 2016-02-12 DIAGNOSIS — C787 Secondary malignant neoplasm of liver and intrahepatic bile duct: Secondary | ICD-10-CM

## 2016-02-12 DIAGNOSIS — C20 Malignant neoplasm of rectum: Secondary | ICD-10-CM

## 2016-02-12 MED ORDER — CAPECITABINE 500 MG PO TABS: ORAL_TABLET | ORAL | 0 refills | Status: DC

## 2016-02-17 ENCOUNTER — Other Ambulatory Visit: Payer: Self-pay | Admitting: *Deleted

## 2016-02-17 DIAGNOSIS — C20 Malignant neoplasm of rectum: Secondary | ICD-10-CM

## 2016-02-18 ENCOUNTER — Ambulatory Visit (HOSPITAL_COMMUNITY)
Admission: RE | Admit: 2016-02-18 | Discharge: 2016-02-18 | Disposition: A | Discharge status: Home / Self Care | Payer: Medicare Other | Source: Ambulatory Visit | Attending: Oncology | Admitting: Oncology

## 2016-02-18 ENCOUNTER — Other Ambulatory Visit (HOSPITAL_BASED_OUTPATIENT_CLINIC_OR_DEPARTMENT_OTHER): Payer: Medicare Other

## 2016-02-18 DIAGNOSIS — I313 Pericardial effusion (noninflammatory): Secondary | ICD-10-CM | POA: Diagnosis not present

## 2016-02-18 DIAGNOSIS — K4090 Unilateral inguinal hernia, without obstruction or gangrene, not specified as recurrent: Secondary | ICD-10-CM | POA: Insufficient documentation

## 2016-02-18 DIAGNOSIS — I517 Cardiomegaly: Secondary | ICD-10-CM | POA: Diagnosis not present

## 2016-02-18 DIAGNOSIS — I700 Atherosclerosis of aorta: Secondary | ICD-10-CM | POA: Insufficient documentation

## 2016-02-18 DIAGNOSIS — C7800 Secondary malignant neoplasm of unspecified lung: Secondary | ICD-10-CM | POA: Diagnosis not present

## 2016-02-18 DIAGNOSIS — R64 Cachexia: Secondary | ICD-10-CM | POA: Insufficient documentation

## 2016-02-18 DIAGNOSIS — C787 Secondary malignant neoplasm of liver and intrahepatic bile duct: Secondary | ICD-10-CM | POA: Insufficient documentation

## 2016-02-18 DIAGNOSIS — C20 Malignant neoplasm of rectum: Secondary | ICD-10-CM

## 2016-02-18 DIAGNOSIS — R188 Other ascites: Secondary | ICD-10-CM | POA: Insufficient documentation

## 2016-02-18 DIAGNOSIS — N200 Calculus of kidney: Secondary | ICD-10-CM | POA: Insufficient documentation

## 2016-02-18 LAB — COMPREHENSIVE METABOLIC PANEL
ALT: 13 U/L (ref 0–55)
ANION GAP: 9 meq/L (ref 3–11)
AST: 28 U/L (ref 5–34)
Albumin: 3.1 g/dL — ABNORMAL LOW (ref 3.5–5.0)
Alkaline Phosphatase: 173 U/L — ABNORMAL HIGH (ref 40–150)
BUN: 21 mg/dL (ref 7.0–26.0)
CALCIUM: 9.4 mg/dL (ref 8.4–10.4)
CHLORIDE: 102 meq/L (ref 98–109)
CO2: 29 meq/L (ref 22–29)
CREATININE: 0.6 mg/dL (ref 0.6–1.1)
EGFR: 81 mL/min/{1.73_m2} — AB (ref >90)
Glucose: 98 mg/dl (ref 70–140)
POTASSIUM: 3.5 meq/L (ref 3.5–5.1)
Sodium: 140 mEq/L (ref 136–145)
Total Bilirubin: 1.03 mg/dL (ref 0.20–1.20)
Total Protein: 5.9 g/dL — ABNORMAL LOW (ref 6.4–8.3)

## 2016-02-18 LAB — CBC WITH DIFFERENTIAL/PLATELET
BASO%: 0.2 % (ref 0.0–2.0)
BASOS ABS: 0 10*3/uL (ref 0.0–0.1)
EOS ABS: 0 10*3/uL (ref 0.0–0.5)
EOS%: 0.3 % (ref 0.0–7.0)
HEMATOCRIT: 32.9 % — AB (ref 34.8–46.6)
HGB: 11 g/dL — ABNORMAL LOW (ref 11.6–15.9)
LYMPH#: 0.3 10*3/uL — AB (ref 0.9–3.3)
LYMPH%: 3.8 % — AB (ref 14.0–49.7)
MCH: 35.5 pg — AB (ref 25.1–34.0)
MCHC: 33.4 g/dL (ref 31.5–36.0)
MCV: 106.1 fL — AB (ref 79.5–101.0)
MONO#: 0.6 10*3/uL (ref 0.1–0.9)
MONO%: 9.6 % (ref 0.0–14.0)
NEUT#: 5.6 10*3/uL (ref 1.5–6.5)
NEUT%: 86.1 % — AB (ref 38.4–76.8)
PLATELETS: 176 10*3/uL (ref 145–400)
RBC: 3.1 10*6/uL — AB (ref 3.70–5.45)
RDW: 17.7 % — ABNORMAL HIGH (ref 11.2–14.5)
WBC: 6.5 10*3/uL (ref 3.9–10.3)

## 2016-02-18 LAB — GLUCOSE, CAPILLARY: Glucose-Capillary: 92 mg/dL (ref 65–99)

## 2016-02-18 MED ORDER — FLUDEOXYGLUCOSE F - 18 (FDG) INJECTION: 6.3000 | Freq: Once | INTRAVENOUS | Status: DC | PRN

## 2016-02-20 ENCOUNTER — Ambulatory Visit (HOSPITAL_BASED_OUTPATIENT_CLINIC_OR_DEPARTMENT_OTHER): Payer: Medicare Other | Admitting: Oncology

## 2016-02-20 ENCOUNTER — Telehealth: Payer: Self-pay | Admitting: Oncology

## 2016-02-20 VITALS — BP 158/73 | HR 81 | Temp 97.6°F | Resp 18 | Ht 67.0 in | Wt 137.9 lb

## 2016-02-20 DIAGNOSIS — Z7189 Other specified counseling: Secondary | ICD-10-CM | POA: Insufficient documentation

## 2016-02-20 DIAGNOSIS — R600 Localized edema: Secondary | ICD-10-CM

## 2016-02-20 DIAGNOSIS — Z86711 Personal history of pulmonary embolism: Secondary | ICD-10-CM

## 2016-02-20 DIAGNOSIS — C20 Malignant neoplasm of rectum: Secondary | ICD-10-CM

## 2016-02-20 DIAGNOSIS — C787 Secondary malignant neoplasm of liver and intrahepatic bile duct: Secondary | ICD-10-CM

## 2016-02-20 DIAGNOSIS — Z7901 Long term (current) use of anticoagulants: Secondary | ICD-10-CM

## 2016-02-20 NOTE — Progress Notes (Signed)
Hematology and Oncology Follow Up Visit  Bethany [name] [id number] 03-May-1928 84 y.o. 02/20/2016 10:14 AM Bethany [name], MDKim, Bethany Rinks, MD   Principle Diagnosis: 83 year old woman with rectal adenocarcinoma diagnosed in August of 2015. Her staging by EUS his T3 N0. MRI of the liver showed potential hepatic metastasis.  Past therapy:  Radiation therapy concomitantly with oral Xeloda at 500 mg twice a day started on 10/25/2013. Therapy concluded in October 2015. She received total dose to 4320 cGy. Therapy interrupted due to recurrent hospitalizations. She is status post robotic-assisted rectosigmoid low anterior resection done on 05/17/2014. The pathology revealed a T3 N1 disease. PET/CT scan in July 2016 showed progresses metastatic disease in the liver and pulmonary nodules.   Current therapy:  Xeloda 1000 mg daily started in September 2016. She takes it 2 weeks on 1 week off per cycle. She is status post 3 cycles of therapy related to proceed with a third one. The dose was increased to 1500 mg daily starting on 01/04/2015.  Interim History:  Bethany [name] presents today for a followup visit with her son. Since the last visit, she reports slow decline in her overall whole health. Her lower extremity edema and anasarca is increasing. Despite her attempts to increase her nutritional intake her protein continues to decline. She tried nutritional supplements and cause her diarrhea. Her mobility is still limited using a walker. She did have one fall out of her chair and she could not get up on her own.    She continues to take Xeloda without any major complications. She reports no nausea, vomiting or diarrhea. She denied any hand-foot syndrome. She denied any recent hospitalizations or illnesses.   She has not had any headaches or blurry vision. She does not report any syncope or seizures. She does not report any chest pain, shortness of breath, cough or hemoptysis. She does not report any palpitation,  orthopnea or PND. She denies abdominal pain, constipation or obstruction. She does not report any frequency urgency or hesitancy. She is not abort any skeletal complaints. Rest of the review of systems unremarkable.     Medications: I have reviewed the patient's current medications.  Current Outpatient Prescriptions  Medication Sig Dispense Refill  . acetaminophen (TYLENOL) 325 MG tablet Take 2 tablets (650 mg total) by mouth every 6 (six) hours as needed for mild pain (or Fever >/= 101).    . capecitabine (XELODA) 500 MG tablet Take 1 tablet in the morning and 2 tablets in the evening after meals for 14 days then 7 days off. 42 tablet 0  . cholecalciferol (VITAMIN D) 1000 UNITS tablet Take 2,000 Units by mouth daily.    . clobetasol cream (TEMOVATE) [id number] % Apply 1 application topically 2 (two) times daily. 30 g 0  . feeding supplement (BOOST HIGH PROTEIN) LIQD Take 1 Container by mouth 3 (three) times daily as needed (appetite).    . feeding supplement, ENSURE ENLIVE, (ENSURE ENLIVE) LIQD Take 237 mLs by mouth 3 (three) times daily between meals. 123XX123 mL 12  . folic acid (FOLVITE) A999333 MCG tablet Take 800 mcg by mouth daily.    Marland Kitchen gabapentin (NEURONTIN) 100 MG capsule Take 1 capsule (100 mg total) by mouth 3 (three) times daily. 90 capsule 4  . levothyroxine (SYNTHROID, LEVOTHROID) 25 MCG tablet Take 25 mcg by mouth See admin instructions. 25 mcg  on Monday, Wednesday, Friday and Sunday.    . levothyroxine (SYNTHROID, LEVOTHROID) 75 MCG tablet Take 1 tablet by mouth See admin instructions.  63mcg on Tuesday, Thursday, Saturday.    . magnesium chloride (SLOW-MAG) 64 MG TBEC SR tablet Take 2 tablets (128 mg total) by mouth daily. 60 tablet 0  . ondansetron (ZOFRAN) 8 MG tablet Take 1 tablet (8 mg total) by mouth every 8 (eight) hours as needed for nausea or vomiting. 20 tablet 0  . PEDIALYTE (PEDIALYTE) SOLN Take 240 mLs by mouth 3 (three) times daily as needed (poor appetite).    . Probiotic Product  (RISA-BID PROBIOTIC PO) Take 1 tablet by mouth 2 (two) times daily.    . prochlorperazine (COMPAZINE) 10 MG tablet Take 1 tablet (10 mg total) by mouth every 6 (six) hours as needed for nausea or vomiting. 30 tablet 0  . rivaroxaban (XARELTO) 20 MG TABS tablet Take 1 tablet (20 mg total) by mouth daily with supper. 30 tablet 0  . saccharomyces boulardii (FLORASTOR) 250 MG capsule Take 1 capsule (250 mg total) by mouth 2 (two) times daily. 60 capsule 0  . vitamin B-12 (CYANOCOBALAMIN) 1000 MCG tablet Place 2,000 mcg under the tongue daily.     No current facility-administered medications for this visit.    Facility-Administered Medications Ordered in Other Visits  Medication Dose Route Frequency Provider Last Rate Last Dose  . fludeoxyglucose F - 18 (FDG) injection 6.3 millicurie  6.3 millicurie Intravenous Once PRN Sherryl Barters, MD         Allergies:  Allergies  Allergen Reactions  . Cataflam [Diclofenac] Other (See Comments)    Unknown; patient is unaware of allergy.  . Cephalosporins Shortness Of Breath  . Erythromycin Shortness Of Breath  . Keflex [Cephalexin] Shortness Of Breath  . Propoxyphene     Unknown rxn  . Codeine Nausea And Vomiting  . Naproxen Other (See Comments)    Unknown; patient is unaware of allergy  . Penicillins Other (See Comments)    Patient states that she CAN take this; unaware of allergy, pt/family is unable to answer penicillin related questions as they do not recall this allergy reaction at all  . Synalgos-Dc [Aspirin-Caff-Dihydrocodeine] Other (See Comments)    Unknown; patient is unaware of allergy  . Ultram [Tramadol] Other (See Comments)    Unknown; patient is unaware of allergy    Past Medical History, Surgical history, Social history, and Family History were reviewed and updated.   Physical Exam: Blood pressure (!) 158/73, pulse 81, temperature 97.6 F (36.4 C), temperature source Oral, resp. rate 18, height 5\' 7"  (1.702 m), weight 137 lb  14.4 oz (62.6 kg), SpO2 100 %. ECOG: 2 General appearance:Alert, awake woman chronically ill-appearing. Very frail. Head: Normocephalic, without obvious abnormality  No oral ulcers or lesions. Neck: no adenopathy or thyroid masses. Lymph nodes: Cervical, supraclavicular, and axillary nodes normal. Heart: Irregular without murmurs, rubs or gallops. Lung:chest clear, no wheezing, rales, normal symmetric air entry.  Abdomen: soft, non-tender, without masses or organomegaly. No shifting dullness or ascites. EXT: 2+ lower extremity edema noted bilaterally. Neurological: No deficits noted.   Lab Results: Lab Results  Component Value Date   WBC 6.5 02/18/2016   HGB 11.0 (L) 02/18/2016   HCT 32.9 (L) 02/18/2016   MCV 106.1 (H) 02/18/2016   PLT 176 02/18/2016     Chemistry      Component Value Date/Time   NA 140 02/18/2016 1146   K 3.5 02/18/2016 1146   CL 104 08/11/2015 0524   CO2 29 02/18/2016 1146   BUN 21.0 02/18/2016 1146   CREATININE 0.6 02/18/2016 1146  Component Value Date/Time   CALCIUM 9.4 02/18/2016 1146   ALKPHOS 173 (H) 02/18/2016 1146   AST 28 02/18/2016 1146   ALT 13 02/18/2016 1146   BILITOT 1.03 02/18/2016 1146      EXAM: NUCLEAR MEDICINE PET SKULL BASE TO THIGH  TECHNIQUE: 6.3 mCi F-18 FDG was injected intravenously. Full-ring PET imaging was performed from the skull base to thigh after the radiotracer. CT data was obtained and used for attenuation correction and anatomic localization.  FASTING BLOOD GLUCOSE:  Value: 92 mg/dl  COMPARISON:  04/11/2015  FINDINGS: NECK  No hypermetabolic lymph nodes in the neck.  CHEST  Numerous new and enlarging bilateral hypermetabolic pulmonary nodules and masses. A representative 3.5 by 2.8 cm right lower lobe mass has maximum standard uptake value of 11.8 measured on image 43/8, and previously measured 1.2 by 0.9 cm with maximum SUV of 5.6 new small right pleural effusion nonspecific for  malignant involvement. No well-defined hypermetabolic adenopathy in the chest.  Mild cardiomegaly. Small pericardial effusion. Low-density blood pool.  ABDOMEN/PELVIS  Numerous new and enlarging centrally necrotic metastatic lesions in the liver. These are especially confluent in the right hepatic lobe, however by way of illustration a lateral segment left hepatic lobe the centrally necrotic lesion with hypermetabolic activity measuring 8.9 by 6.5 cm has a maximum standard uptake value of 13.1, this lesion previously measured 4.1 by 3.9 cm with a maximum SUV of 17.4. A new hypermetabolic lesion in the lateral segment left hepatic lobe has a maximum standard uptake value of 11.5. Most of the previously seen lesions have considerably enlarged.  Left lower quadrant colostomy.  Prior AP resection.  There is new ascites especially in the perihepatic space and pelvis, and new mesenteric edema. Small amount of hypermetabolic activity along the original region of the anus, with postoperative findings in this vicinity. Small focal areas of metabolic activity along the bowel, making it difficult to completely exclude peritoneal carcinomatosis, although overt peritoneal caking of tumor is not shown.  Bilateral nonobstructive nephrolithiasis. Aortoiliac atherosclerotic vascular disease. Indirect left inguinal hernia contains a hydrocele.  There is diffuse mesenteric and subcutaneous edema along with generalized cachexia.  SKELETON  No focal hypermetabolic activity to suggest skeletal metastasis.  IMPRESSION: 1. Numerous new and enlarging pulmonary and hepatic metastatic lesions compatible with advancing malignancy. 2. New ascites along with mesenteric and subcutaneous edema compatible with third spacing of fluid. I do not see definite peritoneal implants of tumor although small implants might be difficult to detect given the background bowel activity. 3. Other imaging  findings of potential clinical significance: Mild cardiomegaly. Small pericardial effusion. Low-density blood pool suggests anemia. Bilateral nonobstructive nephrolithiasis. Aortoiliac atherosclerotic vascular disease. Indirect left inguinal hernia contains a hydrocele. Cachexia.     Impression and Plan:  83 year old woman with the following issues:   1. Rectal cancer presented with hematochezia and status post colonoscopy and EUS. The care of by EUS criteria was 4 cm in length around 4-5 cm from the anal verge. The staging was T3 N0 M1 with possible liver metastasis. She is status post radiation therapy with oral Xeloda with still residual tumor as evident by the PET scan results.   She is status post surgical resection with a pathology revealed invasive adenocarcinoma through the muscularis propria. There are 2 extramural satellite tumor nodules. 12 lymph nodes were negative for metastatic cancer.  He develops systemic progression of her disease and currently on oral Xeloda. Since September 2016.  PET CT scan obtained on 02/18/2016 showed  progression of disease.  She is not a candidate for any additional therapy at this time. Xeloda have palliated her symptoms reasonably well up until this time. I feel that she is approaching end-stage status and likely will require hospice in the near future. Risks and benefits of continuing Xeloda were reviewed today and she agrees to continue for the time being. She understands that we will discontinue Xeloda if she is debilitated from her malignancy. For the time being we'll continue to monitor closely regarding this issue.   2. Hepatic masses. These have grown in size and she is not a candidate for liver directed therapy at this time.  3. History of pulmonary embolism: She is on full dose anticoagulation with Xarelto. She denied any recent bleeding complications.  4. Lower extremity edema: Likely related to poor nutritional status and low  albumin. I agree with reinstituting Lasix per Dr. Julianne Rice discretion.  5. Prognosis: Very poor with limited life expectancy. I have discussed with her the role of hospice at this time and end-of-life discussion. She will consider that for the time being we'll like to continue aggressive therapy.  6. Follow-up: Will be to 4 weeks.   [facility], MD 1/11/201810:14 AM

## 2016-02-20 NOTE — Telephone Encounter (Signed)
Reviewed future appt and gave avs °

## 2016-03-03 ENCOUNTER — Other Ambulatory Visit: Payer: Self-pay | Admitting: *Deleted

## 2016-03-03 DIAGNOSIS — C20 Malignant neoplasm of rectum: Secondary | ICD-10-CM

## 2016-03-03 DIAGNOSIS — C787 Secondary malignant neoplasm of liver and intrahepatic bile duct: Secondary | ICD-10-CM

## 2016-03-03 MED ORDER — CAPECITABINE 500 MG PO TABS: ORAL_TABLET | ORAL | 0 refills | Status: DC

## 2016-03-17 ENCOUNTER — Telehealth: Payer: Self-pay | Admitting: Oncology

## 2016-03-17 ENCOUNTER — Other Ambulatory Visit (HOSPITAL_BASED_OUTPATIENT_CLINIC_OR_DEPARTMENT_OTHER): Payer: Medicare Other

## 2016-03-17 ENCOUNTER — Ambulatory Visit (HOSPITAL_BASED_OUTPATIENT_CLINIC_OR_DEPARTMENT_OTHER): Payer: Medicare Other | Admitting: Oncology

## 2016-03-17 VITALS — BP 132/43 | HR 80 | Temp 98.0°F | Resp 17 | Ht 67.0 in | Wt 143.3 lb

## 2016-03-17 DIAGNOSIS — Z7901 Long term (current) use of anticoagulants: Secondary | ICD-10-CM | POA: Diagnosis not present

## 2016-03-17 DIAGNOSIS — R609 Edema, unspecified: Secondary | ICD-10-CM | POA: Diagnosis not present

## 2016-03-17 DIAGNOSIS — C20 Malignant neoplasm of rectum: Secondary | ICD-10-CM

## 2016-03-17 DIAGNOSIS — C787 Secondary malignant neoplasm of liver and intrahepatic bile duct: Secondary | ICD-10-CM | POA: Diagnosis not present

## 2016-03-17 DIAGNOSIS — Z86711 Personal history of pulmonary embolism: Secondary | ICD-10-CM

## 2016-03-17 LAB — CBC WITH DIFFERENTIAL/PLATELET
BASO%: 0.5 % (ref 0.0–2.0)
BASOS ABS: 0 10*3/uL (ref 0.0–0.1)
EOS ABS: 0 10*3/uL (ref 0.0–0.5)
EOS%: 0.1 % (ref 0.0–7.0)
HEMATOCRIT: 31.7 % — AB (ref 34.8–46.6)
HEMOGLOBIN: 10.7 g/dL — AB (ref 11.6–15.9)
LYMPH#: 0.2 10*3/uL — AB (ref 0.9–3.3)
LYMPH%: 2.8 % — ABNORMAL LOW (ref 14.0–49.7)
MCH: 36.2 pg — AB (ref 25.1–34.0)
MCHC: 33.6 g/dL (ref 31.5–36.0)
MCV: 107.6 fL — AB (ref 79.5–101.0)
MONO#: 0.7 10*3/uL (ref 0.1–0.9)
MONO%: 10.3 % (ref 0.0–14.0)
NEUT%: 86.3 % — ABNORMAL HIGH (ref 38.4–76.8)
NEUTROS ABS: 5.9 10*3/uL (ref 1.5–6.5)
Platelets: 138 10*3/uL — ABNORMAL LOW (ref 145–400)
RBC: 2.94 10*6/uL — ABNORMAL LOW (ref 3.70–5.45)
RDW: 18.4 % — AB (ref 11.2–14.5)
WBC: 6.8 10*3/uL (ref 3.9–10.3)

## 2016-03-17 LAB — CEA (IN HOUSE-CHCC): CEA (CHCC-IN HOUSE): 174.99 ng/mL — AB (ref 0.00–5.00)

## 2016-03-17 LAB — COMPREHENSIVE METABOLIC PANEL
ALBUMIN: 2.8 g/dL — AB (ref 3.5–5.0)
ALK PHOS: 242 U/L — AB (ref 40–150)
ALT: 15 U/L (ref 0–55)
AST: 36 U/L — AB (ref 5–34)
Anion Gap: 8 mEq/L (ref 3–11)
BILIRUBIN TOTAL: 1.14 mg/dL (ref 0.20–1.20)
BUN: 17.7 mg/dL (ref 7.0–26.0)
CALCIUM: 9 mg/dL (ref 8.4–10.4)
CO2: 31 mEq/L — ABNORMAL HIGH (ref 22–29)
Chloride: 99 mEq/L (ref 98–109)
Creatinine: 0.6 mg/dL (ref 0.6–1.1)
EGFR: 83 mL/min/{1.73_m2} — ABNORMAL LOW (ref >90)
GLUCOSE: 92 mg/dL (ref 70–140)
POTASSIUM: 3.5 meq/L (ref 3.5–5.1)
SODIUM: 139 meq/L (ref 136–145)
TOTAL PROTEIN: 5.5 g/dL — AB (ref 6.4–8.3)

## 2016-03-17 NOTE — Progress Notes (Signed)
Hospice referral made, with [facility]. Dr Alen Blew to be the attending. hospice physicians may do symptom management and have DNR signed, per patient and families' request. Educational information on hospice given to wallace, patient's nephew and care giver.

## 2016-03-17 NOTE — Telephone Encounter (Signed)
Appointments scheduled per 03/17/16 los. Patient was given a copy of the AVS report and appointment schedule,per 03/17/16 los. °

## 2016-03-17 NOTE — Progress Notes (Signed)
Hematology and Oncology Follow Up Visit  Bethany [name] [id number] 1928/11/01 83 y.o. 03/17/2016 10:31 AM Bethany [name], MDKim, Bethany Rinks, MD   Principle Diagnosis: 83 year old woman with rectal adenocarcinoma diagnosed in August of 2015. Her staging by EUS his T3 N0. MRI of the liver showed potential hepatic metastasis.  Past therapy:  Radiation therapy concomitantly with oral Xeloda at 500 mg twice a day started on 10/25/2013. Therapy concluded in October 2015. She received total dose to 4320 cGy. Therapy interrupted due to recurrent hospitalizations. She is status post robotic-assisted rectosigmoid low anterior resection done on 05/17/2014. The pathology revealed a T3 N1 disease. PET/CT scan in July 2016 showed progresses metastatic disease in the liver and pulmonary nodules.   Current therapy:  Xeloda 1000 mg daily started in September 2016. She takes it 2 weeks on 1 week off per cycle. She is status post 3 cycles of therapy related to proceed with a third one. The dose was increased to 1500 mg daily starting on 01/04/2015. Therapy to be discontinued in February 2018 due to progression of disease.  Supportive care and hospice enrollment is in the immediate future.  Interim History:  Bethany [name] presents today for a followup visit with her son. Since the last visit, she continues to report further decline in her health. Her performance status is limited in her mobility is declining. She is spending more time sitting in a chair and sleeping. Her lower extremity edema becoming more problematic. Her appetite is poor but not reporting any increased pain.   She continues to take Xeloda without any major complications. She reports no nausea, vomiting or diarrhea. She denied any hand-foot syndrome. She denied any recent hospitalizations or other complications.   She has not had any headaches or blurry vision. She does not report any syncope or seizures. She does not report any chest pain, shortness of breath,  cough or hemoptysis. She does not report any palpitation, orthopnea or PND. She denies abdominal pain, constipation or obstruction. She does not report any frequency urgency or hesitancy. She is not abort any skeletal complaints. Rest of the review of systems unremarkable.     Medications: I have reviewed the patient's current medications.  Current Outpatient Prescriptions  Medication Sig Dispense Refill  . acetaminophen (TYLENOL) 325 MG tablet Take 2 tablets (650 mg total) by mouth every 6 (six) hours as needed for mild pain (or Fever >/= 101).    . capecitabine (XELODA) 500 MG tablet Take 1 tablet in the morning and 2 tablets in the evening after meals for 14 days then 7 days off. 42 tablet 0  . cholecalciferol (VITAMIN D) 1000 UNITS tablet Take 2,000 Units by mouth daily.    . clobetasol cream (TEMOVATE) [id number] % Apply 1 application topically 2 (two) times daily. 30 g 0  . feeding supplement (BOOST HIGH PROTEIN) LIQD Take 1 Container by mouth 3 (three) times daily as needed (appetite).    . feeding supplement, ENSURE ENLIVE, (ENSURE ENLIVE) LIQD Take 237 mLs by mouth 3 (three) times daily between meals. 123XX123 mL 12  . folic acid (FOLVITE) [id number] MCG tablet Take 800 mcg by mouth daily.    Marland Kitchen gabapentin (NEURONTIN) 100 MG capsule Take 1 capsule (100 mg total) by mouth 3 (three) times daily. 90 capsule 4  . levothyroxine (SYNTHROID, LEVOTHROID) 25 MCG tablet Take 25 mcg by mouth See admin instructions. 25 mcg  on Monday, Wednesday, Friday and Sunday.    . levothyroxine (SYNTHROID, LEVOTHROID) 75 MCG tablet Take 1  tablet by mouth See admin instructions. 42mcg on Tuesday, Thursday, Saturday.    . magnesium chloride (SLOW-MAG) 64 MG TBEC SR tablet Take 2 tablets (128 mg total) by mouth daily. 60 tablet 0  . ondansetron (ZOFRAN) 8 MG tablet Take 1 tablet (8 mg total) by mouth every 8 (eight) hours as needed for nausea or vomiting. 20 tablet 0  . PEDIALYTE (PEDIALYTE) SOLN Take 240 mLs by mouth 3 (three) times  daily as needed (poor appetite).    . Probiotic Product (RISA-BID PROBIOTIC PO) Take 1 tablet by mouth 2 (two) times daily.    . prochlorperazine (COMPAZINE) 10 MG tablet Take 1 tablet (10 mg total) by mouth every 6 (six) hours as needed for nausea or vomiting. 30 tablet 0  . rivaroxaban (XARELTO) 20 MG TABS tablet Take 1 tablet (20 mg total) by mouth daily with supper. 30 tablet 0  . saccharomyces boulardii (FLORASTOR) 250 MG capsule Take 1 capsule (250 mg total) by mouth 2 (two) times daily. 60 capsule 0  . vancomycin (VANCOCIN) 125 MG capsule TK 1 C PO QID FOR 14 DAYS  0  . vitamin B-12 (CYANOCOBALAMIN) 1000 MCG tablet Place 2,000 mcg under the tongue daily.     No current facility-administered medications for this visit.      Allergies:  Allergies  Allergen Reactions  . Cataflam [Diclofenac] Other (See Comments)    Unknown; patient is unaware of allergy.  . Cephalosporins Shortness Of Breath  . Erythromycin Shortness Of Breath  . Keflex [Cephalexin] Shortness Of Breath  . Propoxyphene     Unknown rxn  . Codeine Nausea And Vomiting  . Naproxen Other (See Comments)    Unknown; patient is unaware of allergy  . Penicillins Other (See Comments)    Patient states that she CAN take this; unaware of allergy, pt/family is unable to answer penicillin related questions as they do not recall this allergy reaction at all  . Synalgos-Dc [Aspirin-Caff-Dihydrocodeine] Other (See Comments)    Unknown; patient is unaware of allergy  . Ultram [Tramadol] Other (See Comments)    Unknown; patient is unaware of allergy    Past Medical History, Surgical history, Social history, and Family History were reviewed and updated.   Physical Exam: Blood pressure (!) 132/43, pulse 80, temperature 98 F (36.7 C), temperature source Oral, resp. rate 17, height 5\' 7"  (1.702 m), weight 143 lb 4.8 oz (65 kg), SpO2 99 %. ECOG: 2 General appearance: Frail woman appeared without distress.  Head: Normocephalic,  without obvious abnormality  No oral thrush noted.  Neck: no adenopathy or thyroid masses. Lymph nodes: Cervical, supraclavicular, and axillary nodes normal. Heart: Irregular without murmurs, rubs or gallops. Lung:chest clear, no wheezing, rales, normal symmetric air entry.  Abdomen: soft, non-tender, without masses or organomegaly. No rebound or guarding. EXT: 3+ lower extremity edema noted bilaterally. Neurological: No deficits noted.   Lab Results: Lab Results  Component Value Date   WBC 6.8 03/17/2016   HGB 10.7 (L) 03/17/2016   HCT 31.7 (L) 03/17/2016   MCV 107.6 (H) 03/17/2016   PLT 138 (L) 03/17/2016     Chemistry      Component Value Date/Time   NA 140 02/18/2016 1146   K 3.5 02/18/2016 1146   CL 104 08/11/2015 0524   CO2 29 02/18/2016 1146   BUN 21.0 02/18/2016 1146   CREATININE 0.6 02/18/2016 1146      Component Value Date/Time   CALCIUM 9.4 02/18/2016 1146   ALKPHOS 173 (H) 02/18/2016 1146  AST 28 02/18/2016 1146   ALT 13 02/18/2016 1146   BILITOT 1.03 02/18/2016 1146        IMPRESSION: 1. Numerous new and enlarging pulmonary and hepatic metastatic lesions compatible with advancing malignancy. 2. New ascites along with mesenteric and subcutaneous edema compatible with third spacing of fluid. I do not see definite peritoneal implants of tumor although small implants might be difficult to detect given the background bowel activity. 3. Other imaging findings of potential clinical significance: Mild cardiomegaly. Small pericardial effusion. Low-density blood pool suggests anemia. Bilateral nonobstructive nephrolithiasis. Aortoiliac atherosclerotic vascular disease. Indirect left inguinal hernia contains a hydrocele. Cachexia.     Impression and Plan:  83 year old woman with the following issues:   1. Rectal cancer presented with hematochezia and status post colonoscopy and EUS. The care of by EUS criteria was 4 cm in length around 4-5 cm from  the anal verge. The staging was T3 N0 M1 with possible liver metastasis. She is status post radiation therapy with oral Xeloda with still residual tumor as evident by the PET scan results.   She is status post surgical resection with a pathology revealed invasive adenocarcinoma through the muscularis propria. There are 2 extramural satellite tumor nodules. 12 lymph nodes were negative for metastatic cancer.  He develops systemic progression of her disease and currently on oral Xeloda. Since September 2016.  PET CT scan obtained on 02/18/2016 showed progression of disease.  The PET scan was reviewed again and discussed with the patient and her son. She is clearly progressing and declining clinically. Given her poor performance status and advanced disease I have recommended hospice enrollment. I recommended discontinuation of Xeloda and continue with supportive care only.   2. Hepatic masses. These have grown in size and she is not a candidate for liver directed therapy at this time.  3. History of pulmonary embolism: She is on full dose anticoagulation with Xarelto. She denied any recent bleeding complications.  4. Lower extremity edema: Likely related to poor nutritional status and low albumin.  5. Prognosis: Very poor with limited life expectancy. She qualifies for hospice and will have them involved in the immediate future. I anticipated life expectancy of less than 6 months given her poor nutritional status, poor performance status and progression of her cancer.  6. Follow-up: Will be to 4 weeks or as needed depending on her overall decline.  [facility], MD 2/6/201810:31 AM

## 2016-03-17 NOTE — Addendum Note (Signed)
Addended by: Randolm Idol on: 03/17/2016 10:48 AM   Modules accepted: Orders

## 2016-03-30 ENCOUNTER — Other Ambulatory Visit: Payer: Self-pay | Admitting: *Deleted

## 2016-03-30 MED ORDER — SACCHAROMYCES BOULARDII 250 MG PO CAPS: 250.0000 mg | ORAL_CAPSULE | Freq: Two times a day (BID) | ORAL | 2 refills | Status: DC

## 2016-04-11 ENCOUNTER — Emergency Department (HOSPITAL_COMMUNITY)

## 2016-04-11 ENCOUNTER — Inpatient Hospital Stay (HOSPITAL_COMMUNITY)

## 2016-04-11 ENCOUNTER — Encounter (HOSPITAL_COMMUNITY): Payer: Self-pay | Admitting: Emergency Medicine

## 2016-04-11 ENCOUNTER — Inpatient Hospital Stay (HOSPITAL_COMMUNITY)
Admission: EM | Admit: 2016-04-11 | Discharge: 2016-04-13 | DRG: 100 | Disposition: A | Discharge status: Hospice | Attending: Family Medicine | Admitting: Family Medicine

## 2016-04-11 DIAGNOSIS — F418 Other specified anxiety disorders: Secondary | ICD-10-CM | POA: Diagnosis not present

## 2016-04-11 DIAGNOSIS — Z85528 Personal history of other malignant neoplasm of kidney: Secondary | ICD-10-CM

## 2016-04-11 DIAGNOSIS — F419 Anxiety disorder, unspecified: Secondary | ICD-10-CM | POA: Diagnosis present

## 2016-04-11 DIAGNOSIS — G40901 Epilepsy, unspecified, not intractable, with status epilepticus: Secondary | ICD-10-CM

## 2016-04-11 DIAGNOSIS — G40911 Epilepsy, unspecified, intractable, with status epilepticus: Secondary | ICD-10-CM | POA: Diagnosis present

## 2016-04-11 DIAGNOSIS — F329 Major depressive disorder, single episode, unspecified: Secondary | ICD-10-CM | POA: Diagnosis present

## 2016-04-11 DIAGNOSIS — Z90710 Acquired absence of both cervix and uterus: Secondary | ICD-10-CM

## 2016-04-11 DIAGNOSIS — Z66 Do not resuscitate: Secondary | ICD-10-CM | POA: Diagnosis present

## 2016-04-11 DIAGNOSIS — Z87440 Personal history of urinary (tract) infections: Secondary | ICD-10-CM

## 2016-04-11 DIAGNOSIS — G40211 Localization-related (focal) (partial) symptomatic epilepsy and epileptic syndromes with complex partial seizures, intractable, with status epilepticus: Secondary | ICD-10-CM

## 2016-04-11 DIAGNOSIS — I480 Paroxysmal atrial fibrillation: Secondary | ICD-10-CM | POA: Diagnosis not present

## 2016-04-11 DIAGNOSIS — C7800 Secondary malignant neoplasm of unspecified lung: Secondary | ICD-10-CM

## 2016-04-11 DIAGNOSIS — Z885 Allergy status to narcotic agent status: Secondary | ICD-10-CM

## 2016-04-11 DIAGNOSIS — C20 Malignant neoplasm of rectum: Secondary | ICD-10-CM

## 2016-04-11 DIAGNOSIS — R32 Unspecified urinary incontinence: Secondary | ICD-10-CM | POA: Diagnosis present

## 2016-04-11 DIAGNOSIS — D630 Anemia in neoplastic disease: Secondary | ICD-10-CM | POA: Diagnosis not present

## 2016-04-11 DIAGNOSIS — Z833 Family history of diabetes mellitus: Secondary | ICD-10-CM

## 2016-04-11 DIAGNOSIS — L8990 Pressure ulcer of unspecified site, unspecified stage: Secondary | ICD-10-CM | POA: Insufficient documentation

## 2016-04-11 DIAGNOSIS — Z7901 Long term (current) use of anticoagulants: Secondary | ICD-10-CM

## 2016-04-11 DIAGNOSIS — Z808 Family history of malignant neoplasm of other organs or systems: Secondary | ICD-10-CM

## 2016-04-11 DIAGNOSIS — G9349 Other encephalopathy: Secondary | ICD-10-CM | POA: Diagnosis present

## 2016-04-11 DIAGNOSIS — R29810 Facial weakness: Secondary | ICD-10-CM | POA: Diagnosis present

## 2016-04-11 DIAGNOSIS — G40201 Localization-related (focal) (partial) symptomatic epilepsy and epileptic syndromes with complex partial seizures, not intractable, with status epilepticus: Secondary | ICD-10-CM | POA: Diagnosis present

## 2016-04-11 DIAGNOSIS — L719 Rosacea, unspecified: Secondary | ICD-10-CM | POA: Diagnosis present

## 2016-04-11 DIAGNOSIS — Z515 Encounter for palliative care: Secondary | ICD-10-CM | POA: Diagnosis not present

## 2016-04-11 DIAGNOSIS — Z923 Personal history of irradiation: Secondary | ICD-10-CM

## 2016-04-11 DIAGNOSIS — I10 Essential (primary) hypertension: Secondary | ICD-10-CM | POA: Diagnosis present

## 2016-04-11 DIAGNOSIS — Z880 Allergy status to penicillin: Secondary | ICD-10-CM

## 2016-04-11 DIAGNOSIS — R251 Tremor, unspecified: Secondary | ICD-10-CM | POA: Diagnosis present

## 2016-04-11 DIAGNOSIS — F32A Depression, unspecified: Secondary | ICD-10-CM | POA: Diagnosis present

## 2016-04-11 DIAGNOSIS — Z85828 Personal history of other malignant neoplasm of skin: Secondary | ICD-10-CM

## 2016-04-11 DIAGNOSIS — K219 Gastro-esophageal reflux disease without esophagitis: Secondary | ICD-10-CM | POA: Diagnosis present

## 2016-04-11 DIAGNOSIS — Z79899 Other long term (current) drug therapy: Secondary | ICD-10-CM

## 2016-04-11 DIAGNOSIS — Z86711 Personal history of pulmonary embolism: Secondary | ICD-10-CM | POA: Diagnosis present

## 2016-04-11 DIAGNOSIS — D689 Coagulation defect, unspecified: Secondary | ICD-10-CM

## 2016-04-11 DIAGNOSIS — Z888 Allergy status to other drugs, medicaments and biological substances status: Secondary | ICD-10-CM

## 2016-04-11 DIAGNOSIS — E7800 Pure hypercholesterolemia, unspecified: Secondary | ICD-10-CM | POA: Diagnosis present

## 2016-04-11 DIAGNOSIS — G40111 Localization-related (focal) (partial) symptomatic epilepsy and epileptic syndromes with simple partial seizures, intractable, with status epilepticus: Secondary | ICD-10-CM

## 2016-04-11 DIAGNOSIS — G40301 Generalized idiopathic epilepsy and epileptic syndromes, not intractable, with status epilepticus: Secondary | ICD-10-CM

## 2016-04-11 DIAGNOSIS — Z8249 Family history of ischemic heart disease and other diseases of the circulatory system: Secondary | ICD-10-CM

## 2016-04-11 DIAGNOSIS — R609 Edema, unspecified: Secondary | ICD-10-CM

## 2016-04-11 DIAGNOSIS — Z881 Allergy status to other antibiotic agents status: Secondary | ICD-10-CM

## 2016-04-11 DIAGNOSIS — C787 Secondary malignant neoplasm of liver and intrahepatic bile duct: Secondary | ICD-10-CM

## 2016-04-11 DIAGNOSIS — R748 Abnormal levels of other serum enzymes: Secondary | ICD-10-CM

## 2016-04-11 DIAGNOSIS — Z886 Allergy status to analgesic agent status: Secondary | ICD-10-CM

## 2016-04-11 DIAGNOSIS — E039 Hypothyroidism, unspecified: Secondary | ICD-10-CM | POA: Diagnosis not present

## 2016-04-11 DIAGNOSIS — Z8673 Personal history of transient ischemic attack (TIA), and cerebral infarction without residual deficits: Secondary | ICD-10-CM

## 2016-04-11 DIAGNOSIS — I4891 Unspecified atrial fibrillation: Secondary | ICD-10-CM | POA: Diagnosis present

## 2016-04-11 DIAGNOSIS — D539 Nutritional anemia, unspecified: Secondary | ICD-10-CM

## 2016-04-11 LAB — CBC
HCT: 32.7 % — ABNORMAL LOW (ref 36.0–46.0)
Hemoglobin: 10.4 g/dL — ABNORMAL LOW (ref 12.0–15.0)
MCH: 33.4 pg (ref 26.0–34.0)
MCHC: 31.8 g/dL (ref 30.0–36.0)
MCV: 105.1 fL — ABNORMAL HIGH (ref 78.0–100.0)
PLATELETS: 222 10*3/uL (ref 150–400)
RBC: 3.11 MIL/uL — ABNORMAL LOW (ref 3.87–5.11)
RDW: 14.2 % (ref 11.5–15.5)
WBC: 9.5 10*3/uL (ref 4.0–10.5)

## 2016-04-11 LAB — COMPREHENSIVE METABOLIC PANEL
ALK PHOS: 265 U/L — AB (ref 38–126)
ALT: 16 U/L (ref 14–54)
ANION GAP: 11 (ref 5–15)
AST: 35 U/L (ref 15–41)
Albumin: 2.6 g/dL — ABNORMAL LOW (ref 3.5–5.0)
BUN: 14 mg/dL (ref 6–20)
CALCIUM: 9.1 mg/dL (ref 8.9–10.3)
CO2: 27 mmol/L (ref 22–32)
Chloride: 99 mmol/L — ABNORMAL LOW (ref 101–111)
Creatinine, Ser: 0.75 mg/dL (ref 0.44–1.00)
GFR calc non Af Amer: >60 mL/min (ref >60)
Glucose, Bld: 114 mg/dL — ABNORMAL HIGH (ref 65–99)
POTASSIUM: 3.9 mmol/L (ref 3.5–5.1)
SODIUM: 137 mmol/L (ref 135–145)
Total Bilirubin: 0.7 mg/dL (ref 0.3–1.2)
Total Protein: 5.7 g/dL — ABNORMAL LOW (ref 6.5–8.1)

## 2016-04-11 LAB — APTT: aPTT: 47 seconds — ABNORMAL HIGH (ref 24–36)

## 2016-04-11 LAB — MAGNESIUM: MAGNESIUM: 1.5 mg/dL — AB (ref 1.7–2.4)

## 2016-04-11 LAB — I-STAT CHEM 8, ED
BUN: 16 mg/dL (ref 6–20)
CHLORIDE: 98 mmol/L — AB (ref 101–111)
Calcium, Ion: 1.1 mmol/L — ABNORMAL LOW (ref 1.15–1.40)
Creatinine, Ser: 0.7 mg/dL (ref 0.44–1.00)
Glucose, Bld: 112 mg/dL — ABNORMAL HIGH (ref 65–99)
HEMATOCRIT: 30 % — AB (ref 36.0–46.0)
HEMOGLOBIN: 10.2 g/dL — AB (ref 12.0–15.0)
POTASSIUM: 3.9 mmol/L (ref 3.5–5.1)
SODIUM: 136 mmol/L (ref 135–145)
TCO2: 28 mmol/L (ref 0–100)

## 2016-04-11 LAB — DIFFERENTIAL
BASOS PCT: 0 %
Basophils Absolute: 0 10*3/uL (ref 0.0–0.1)
EOS ABS: 0.1 10*3/uL (ref 0.0–0.7)
EOS PCT: 1 %
LYMPHS PCT: 6 %
Lymphs Abs: 0.5 10*3/uL — ABNORMAL LOW (ref 0.7–4.0)
MONO ABS: 0.8 10*3/uL (ref 0.1–1.0)
Monocytes Relative: 9 %
Neutro Abs: 8 10*3/uL — ABNORMAL HIGH (ref 1.7–7.7)
Neutrophils Relative %: 84 %

## 2016-04-11 LAB — PROTIME-INR
INR: 3.5
PROTHROMBIN TIME: 36 s — AB (ref 11.4–15.2)

## 2016-04-11 LAB — CBG MONITORING, ED: GLUCOSE-CAPILLARY: 106 mg/dL — AB (ref 65–99)

## 2016-04-11 LAB — MRSA PCR SCREENING: MRSA by PCR: NEGATIVE

## 2016-04-11 LAB — GLUCOSE, CAPILLARY: GLUCOSE-CAPILLARY: 77 mg/dL (ref 65–99)

## 2016-04-11 MED ORDER — MAGNESIUM SULFATE 2 GM/50ML IV SOLN
2.0000 g | Freq: Once | INTRAVENOUS | Status: AC
  Administered 2016-04-11: 2 g via INTRAVENOUS
  Filled 2016-04-11: qty 50

## 2016-04-11 MED ORDER — FOLIC ACID 1 MG PO TABS: 500.0000 ug | ORAL_TABLET | Freq: Every day | ORAL | Status: DC

## 2016-04-11 MED ORDER — GABAPENTIN 100 MG PO CAPS: 100.0000 mg | ORAL_CAPSULE | Freq: Three times a day (TID) | ORAL | Status: DC

## 2016-04-11 MED ORDER — VITAMIN D 1000 UNITS PO TABS: 2000.0000 [IU] | ORAL_TABLET | Freq: Every day | ORAL | Status: DC

## 2016-04-11 MED ORDER — ONDANSETRON HCL 4 MG/2ML IJ SOLN: 4.0000 mg | Freq: Four times a day (QID) | INTRAMUSCULAR | Status: DC | PRN

## 2016-04-11 MED ORDER — RIVAROXABAN 20 MG PO TABS: 20.0000 mg | ORAL_TABLET | Freq: Every day | ORAL | Status: DC

## 2016-04-11 MED ORDER — ACETAMINOPHEN 325 MG PO TABS: 650.0000 mg | ORAL_TABLET | Freq: Four times a day (QID) | ORAL | Status: DC | PRN

## 2016-04-11 MED ORDER — PEDIALYTE PO SOLN: 240.0000 mL | Freq: Three times a day (TID) | ORAL | Status: DC | PRN

## 2016-04-11 MED ORDER — VITAMIN B-12 1000 MCG PO TABS: 1000.0000 ug | ORAL_TABLET | Freq: Every day | ORAL | Status: DC

## 2016-04-11 MED ORDER — MAGNESIUM CHLORIDE 64 MG PO TBEC: 2.0000 | DELAYED_RELEASE_TABLET | Freq: Every day | ORAL | Status: DC

## 2016-04-11 MED ORDER — LORAZEPAM 2 MG/ML IJ SOLN
0.5000 mg | INTRAMUSCULAR | Status: DC | PRN
  Administered 2016-04-12 (×2): 0.5 mg via INTRAVENOUS
  Filled 2016-04-11 (×2): qty 1

## 2016-04-11 MED ORDER — MAGNESIUM SULFATE 2 GM/50ML IV SOLN: 2.0000 g | Freq: Once | INTRAVENOUS | Status: DC

## 2016-04-11 MED ORDER — LORAZEPAM 2 MG/ML IJ SOLN
1.0000 mg | Freq: Once | INTRAMUSCULAR | Status: AC
  Administered 2016-04-11: 1 mg via INTRAVENOUS
  Filled 2016-04-11: qty 1

## 2016-04-11 MED ORDER — SODIUM CHLORIDE 0.9 % IV SOLN
500.0000 mg | Freq: Two times a day (BID) | INTRAVENOUS | Status: DC
  Administered 2016-04-11 – 2016-04-13 (×4): 500 mg via INTRAVENOUS
  Filled 2016-04-11 (×5): qty 5

## 2016-04-11 MED ORDER — LEVOTHYROXINE SODIUM 75 MCG PO TABS: 75.0000 ug | ORAL_TABLET | ORAL | Status: DC

## 2016-04-11 MED ORDER — SODIUM CHLORIDE 0.9 % IV SOLN
1000.0000 mg | Freq: Once | INTRAVENOUS | Status: AC
  Administered 2016-04-11: 1000 mg via INTRAVENOUS
  Filled 2016-04-11: qty 10

## 2016-04-11 MED ORDER — FENTANYL CITRATE (PF) 100 MCG/2ML IJ SOLN
12.5000 ug | INTRAMUSCULAR | Status: DC | PRN
  Administered 2016-04-11 – 2016-04-13 (×5): 25 ug via INTRAVENOUS
  Filled 2016-04-11 (×5): qty 2

## 2016-04-11 MED ORDER — VALPROATE SODIUM 500 MG/5ML IV SOLN
1000.0000 mg | Freq: Once | INTRAVENOUS | Status: AC
  Administered 2016-04-11: 1000 mg via INTRAVENOUS
  Filled 2016-04-11: qty 10

## 2016-04-11 MED ORDER — ONDANSETRON HCL 4 MG PO TABS: 4.0000 mg | ORAL_TABLET | Freq: Four times a day (QID) | ORAL | Status: DC | PRN

## 2016-04-11 MED ORDER — LEVOTHYROXINE SODIUM 25 MCG PO TABS: 25.0000 ug | ORAL_TABLET | ORAL | Status: DC

## 2016-04-11 MED ORDER — LORAZEPAM 2 MG/ML IJ SOLN: 2.0000 mg | INTRAMUSCULAR | Status: DC | PRN

## 2016-04-11 MED ORDER — LORAZEPAM 2 MG/ML IJ SOLN
2.0000 mg | Freq: Once | INTRAMUSCULAR | Status: AC
  Administered 2016-04-11: 2 mg via INTRAVENOUS
  Filled 2016-04-11: qty 1

## 2016-04-11 MED ORDER — SODIUM CHLORIDE 0.9 % IV SOLN
INTRAVENOUS | Status: AC
  Administered 2016-04-11: 06:00:00 via INTRAVENOUS

## 2016-04-11 MED ORDER — SODIUM CHLORIDE 0.9% FLUSH
3.0000 mL | Freq: Two times a day (BID) | INTRAVENOUS | Status: DC
  Administered 2016-04-11: 3 mL via INTRAVENOUS
  Administered 2016-04-11: 6 mL via INTRAVENOUS

## 2016-04-11 MED ORDER — ORAL CARE MOUTH RINSE
15.0000 mL | Freq: Two times a day (BID) | OROMUCOSAL | Status: DC
  Administered 2016-04-11 – 2016-04-13 (×4): 15 mL via OROMUCOSAL

## 2016-04-11 MED ORDER — SACCHAROMYCES BOULARDII 250 MG PO CAPS: 250.0000 mg | ORAL_CAPSULE | Freq: Two times a day (BID) | ORAL | Status: DC

## 2016-04-11 MED ORDER — ACETAMINOPHEN 650 MG RE SUPP: 650.0000 mg | Freq: Four times a day (QID) | RECTAL | Status: DC | PRN

## 2016-04-11 MED ORDER — LEVOTHYROXINE SODIUM 75 MCG PO TABS: 75.0000 ug | ORAL_TABLET | Freq: Every day | ORAL | Status: DC

## 2016-04-11 NOTE — Progress Notes (Signed)
EEG completed, results pending. 

## 2016-04-11 NOTE — Progress Notes (Signed)
Bethany [name] came to ED via [facility] with Left side facial droop and Left side weakness. On arrival pt noted to have Left arm jerking and AMS, no facial droop noted. LKW 1900. Code stroke cancelled, admitted to neurology for new onset seizures. Pt started on Keppra 1000 mg gtt and given ativan 2 mg IVP then transported to CT

## 2016-04-11 NOTE — Progress Notes (Signed)
Pt evaluated earlier this am by my associate. Neurology currently working up and managing main problem.  Will plan on reassessing next am.  Gen: pt resting, in nad CV: no cyanosis Pulm: equal chest rise  Judee Hennick, Linward Foster

## 2016-04-11 NOTE — Progress Notes (Signed)
[facility] Liaison:  RN  Spoke with Charlynn Court, PMT, she advised she would like hospice to speak with nephew in regards to [place] and switching patient to comfort care.  I called nephew and he advised that he would "talk to the doctor" and that he wanted to meet with me tomorrow.  I advised I would be able to meet with him in the morning to discuss whether he is wanting further treatments or if he wants patient on comfort care only with no additional tests, etc.    If you have any questions or concerns, please feel free to contact me at [phone] or  Hospice at [phone] after 5pm.   Thank you,  Edyth Gunnels, RN, [facility] Liaison  All hospital liaison's are now on [place].

## 2016-04-11 NOTE — Procedures (Signed)
History: 83 year old female with new onset seizures  Sedation: None  Technique: This is a 21 channel routine scalp EEG performed at the bedside with bipolar and monopolar montages arranged in accordance to the international 10/20 system of electrode placement. One channel was dedicated to EKG recording.    Background: The background consists of intermixed alpha and beta activities. There is a well defined posterior dominant rhythm of 9 Hz that attenuates with eye opening. This is better seen on the left than right. There is also mild generalized irregular theta activity even during maximal wakefulness. Sleep is recorded with spindles on the right having a lower frequency than the left.   Photic stimulation: Physiologic driving is not performed  EEG Abnormalities: 1) asymmetric PDR 2) asymmetric sleep spindles  Clinical Interpretation: This EEG is suggestive of a right hemispheric cerebral dysfunction which is nonspecific in etiology. There was no seizure recorded nor definite seizure predisposition.  Roland Rack, MD [facility] [phone]  If 7pm- 7am, please page neurology on call as listed in [place].

## 2016-04-11 NOTE — Consult Note (Addendum)
NEURO HOSPITALIST CONSULT NOTE   Requestig physician: Dr. Myna Hidalgo  Reason for Consult: Left upper extremity jerking and AMS  History obtained from:  EMS  HPI:                                                                                                                                          Bethany [name] is an 83 y.o. female who was in her [place] on Friday evening when at about 7 PM her nephew first noticed a slight tremor of her LUE. She was also "not acting like herself". This progressed to a left gaze preference and evolution of the focal tremor to jerking movements which were noted by her nephew at about midnight. The jerking movements were noted in conjunction with an inability to open her hand or to move her left arm. Her left eye was also shut and she had an unusual mouth expression. EMS was called. On arrival she had an obvious left facial droop. She received 2.5 mg Versed which reduced but did not eliminate the LUE jerking.   The nephew further clarifies that she has been complaining of left sided weakness and incoordination for a "few days". She has had trouble gripping things with her left hand or using a fork with her left hand.   The patient lives at home with her nephew and is a hospice patient with a history of colon cancer and metastasis to the liver. At baseline she ambulates with a walker.   Her PMHx also includes PE, atrial fibrillation with RVR and renal cancer. She had a TIA a few years ago per her nephew. Home medications include Xarelto.   Past Medical History:  Diagnosis Date  . Acute encephalopathy 12/08/2013  . Anxiety   . Arthritis   . Cancer ([place]) 09/29/13   rectal adenocarcinoma  . Clostridium difficile colitis 12/08/2013  . Cor pulmonale, acute ([place]) 01/02/2012  . Depression   . Dysphonia, spasmodic 05/18/2014  . Fall from standing 01/22/2014  . Frequent bowel movements   . GERD (gastroesophageal reflux disease)   . History of skin cancer     removed from forehead  . Hx of radiation therapy 10/25/13-11/28/13   pelvis, 4500 cGy in 25 sessions  . Hx: UTI (urinary tract infection)   . Hypercholesteremia   . Hypertension    stopped med during chemo /radiation - BP has been stable  . Hypokalemia 11/28/2013  . Hypothyroidism   . Incontinence of urine   . Migraine   . Numbness    "I have no feeling in my left leg" unknown cause  . Ovarian tumor    sees dr Lisbeth Renshaw ob-gyn yearly for evaluation  . Pulmonary embolism ([place]) 01/01/2012  / 11/28/2013  . Rectal cancer ([place]) 09/29/13  invasive adeocarcinoma  . Rectal cancer ([place])   . Rectal mass   . Rosacea   . Severe sepsis with septic shock ([place]) 11/28/2013  . UTI (lower urinary tract infection) 01/23/2014    Past Surgical History:  Procedure Laterality Date  . ABDOMINAL HYSTERECTOMY     years ago, prolapsed uterus  . CATARACT EXTRACTION Bilateral   . COLONOSCOPY N/A 09/22/2013   Procedure: COLONOSCOPY;  Surgeon: Beryle Beams, MD;  Location: [facility];  Service: Endoscopy;  Laterality: N/A;  . EUS N/A 09/29/2013   Procedure: LOWER ENDOSCOPIC ULTRASOUND (EUS);  Surgeon: Beryle Beams, MD;  Location: [facility];  Service: Endoscopy;  Laterality: N/A;  . FLEXIBLE SIGMOIDOSCOPY N/A 08/10/2015   Procedure: FLEXIBLE SIGMOIDOSCOPY;  Surgeon: Carol Ada, MD;  Location: [facility];  Service: Endoscopy;  Laterality: N/A;  . OVARIAN CYST SURGERY  yrs ago    Family History  Problem Relation Age of Onset  . Brain cancer Mother   . Heart attack Father   . Diabetes Sister   . Heart disease Sister    Social History:  reports that she has never smoked. She has never used smokeless tobacco. She reports that she does not drink alcohol or use drugs.  Allergies  Allergen Reactions  . Cataflam [Diclofenac] Other (See Comments)    Unknown; patient is unaware of allergy.  . Cephalosporins Shortness Of Breath  . Erythromycin Shortness Of Breath  . Keflex [Cephalexin] Shortness Of  Breath  . Propoxyphene     Unknown rxn  . Codeine Nausea And Vomiting  . Naproxen Other (See Comments)    Unknown; patient is unaware of allergy  . Penicillins Other (See Comments)    Patient states that she CAN take this; unaware of allergy, pt/family is unable to answer penicillin related questions as they do not recall this allergy reaction at all  . Synalgos-Dc [Aspirin-Caff-Dihydrocodeine] Other (See Comments)    Unknown; patient is unaware of allergy  . Ultram [Tramadol] Other (See Comments)    Unknown; patient is unaware of allergy    MEDICATIONS:                                                                                                                     Prior to Admission:  Prescriptions Prior to Admission  Medication Sig Dispense Refill Last Dose  . acetaminophen (TYLENOL) 325 MG tablet Take 2 tablets (650 mg total) by mouth every 6 (six) hours as needed for mild pain (or Fever >/= 101).   unk  . cholecalciferol (VITAMIN D) 1000 UNITS tablet Take 2,000 Units by mouth daily.   04/10/2016 at Unknown time  . folic acid (FOLVITE) A999333 MCG tablet Take 800 mcg by mouth daily.   04/10/2016 at Unknown time  . gabapentin (NEURONTIN) 100 MG capsule Take 1 capsule (100 mg total) by mouth 3 (three) times daily. 90 capsule 4 04/10/2016 at Unknown time  . levothyroxine (SYNTHROID, LEVOTHROID) 25 MCG tablet Take 25 mcg by mouth See admin instructions. 25  mcg  on Monday, Wednesday, Friday and Sunday.   04/10/2016 at Unknown time  . levothyroxine (SYNTHROID, LEVOTHROID) 75 MCG tablet Take 1 tablet by mouth See admin instructions. 27mcg on Tuesday, Thursday, Saturday.   04/09/2016  . magnesium chloride (SLOW-MAG) 64 MG TBEC SR tablet Take 2 tablets (128 mg total) by mouth daily. 60 tablet 0 04/10/2016 at Unknown time  . ondansetron (ZOFRAN) 8 MG tablet Take 1 tablet (8 mg total) by mouth every 8 (eight) hours as needed for nausea or vomiting. 20 tablet 0 unk  . PEDIALYTE (PEDIALYTE) SOLN Take 240 mLs by  mouth 3 (three) times daily as needed (poor appetite).   unk  . Probiotic Product (RISA-BID PROBIOTIC PO) Take 1 tablet by mouth 2 (two) times daily.   04/10/2016 at Unknown time  . prochlorperazine (COMPAZINE) 10 MG tablet Take 1 tablet (10 mg total) by mouth every 6 (six) hours as needed for nausea or vomiting. 30 tablet 0 unk  . rivaroxaban (XARELTO) 20 MG TABS tablet Take 1 tablet (20 mg total) by mouth daily with supper. 30 tablet 0 04/10/2016 at Unknown time  . saccharomyces boulardii (FLORASTOR) 250 MG capsule Take 1 capsule (250 mg total) by mouth 2 (two) times daily. 60 capsule 2 04/10/2016 at Unknown time  . vitamin B-12 (CYANOCOBALAMIN) 1000 MCG tablet Place 2,000 mcg under the tongue daily.   04/10/2016 at Unknown time   Scheduled: . [START ON 04/12/2016] cholecalciferol  2,000 Units Oral Daily  . [START ON A999333 folic acid  [id number] mcg Oral Daily  . [START ON 04/12/2016] gabapentin  100 mg Oral TID  . [START ON 04/12/2016] levothyroxine  25 mcg Oral Once per day on Sun Mon Wed Fri  . [START ON 04/22/2016] levothyroxine  75 mcg Oral Once per day on Tue Thu Sat  . [START ON 04/12/2016] magnesium chloride  2 tablet Oral Daily  . magnesium sulfate 1 - 4 g bolus IVPB  2 g Intravenous Once  . [START ON 04/12/2016] rivaroxaban  20 mg Oral Q supper  . [START ON 04/12/2016] saccharomyces boulardii  250 mg Oral BID  . sodium chloride flush  3 mL Intravenous Q12H  . [START ON 04/12/2016] vitamin B-12  1,000 mcg Oral Daily    ROS:                                                                                                                                       Unable to obtain due to AMS.   BP 131/67 (BP Location: Right Arm)   Pulse 84   Temp 97.6 F (36.4 C) (Axillary)   Resp (!) 28   Wt 63.5 kg (139 lb 15.9 oz)   SpO2 100%   BMI 21.93 kg/m   General Examination:  General -  diffusely cachectic. HEENT-   Normocephalic/atraumatic.  Lungs- Gross upper airway sounds noted Extremities- Noncyanotic. Prominent lower extremity pitting edema noted.   Neurological Examination Mental Status: Obtunded with eyes open. Not responding to commands. Nonverbal.  Cranial Nerves:  II: No blink to threat bilaterally. PERRL.  III,IV, VI: Ptosis not present, Eyes conjugately deviated slightly to left. V,VII: Mouth held open without gross asymmetry. Diminished reactivity to tactile stimuli.  VIII: Does not respond to questions IX,X: Intact cough reflex. XI:  Spasmodic jerking of anterior neck XII: Does not follow command for tongue extension. Motor/Sensory:  LUE with semirhythmic jerking at about 2 Hz; also involves the left shoulder girdle and neck on the left and anteriorly at midline. LUE is held flexed and adducted. Does not move LUE to command or to noxious. Tone in LUE is increased.  RUE: Moves slightly to noxious. Tone mildly increased.  Bilateral lower extremities: Slight withdrawal to light noxious bilaterally. Does not move to command. Tone mildly increased.  Deep Tendon Reflexes: Hypoactive in the context of increased tone and lower extremity edema. Toes mute.  Cerebellar/Gait: Unable to assess.   Lab Results: Basic Metabolic Panel: No results for input(s): NA, K, CL, CO2, GLUCOSE, BUN, CREATININE, CALCIUM, MG, PHOS in the last 168 hours.  Liver Function Tests: No results for input(s): AST, ALT, ALKPHOS, BILITOT, PROT, ALBUMIN in the last 168 hours. No results for input(s): LIPASE, AMYLASE in the last 168 hours. No results for input(s): AMMONIA in the last 168 hours.  CBC: No results for input(s): WBC, NEUTROABS, HGB, HCT, MCV, PLT in the last 168 hours.  Cardiac Enzymes: No results for input(s): CKTOTAL, CKMB, CKMBINDEX, TROPONINI in the last 168 hours.  Lipid Panel: No results for input(s): CHOL, TRIG, HDL, CHOLHDL, VLDL, LDLCALC in the last 168 hours.  CBG: No results for input(s):  GLUCAP in the last 168 hours.  Microbiology: Results for orders placed or performed during the hospital encounter of 07/31/15  Culture, blood (x 2)     Status: None   Collection Time: 07/31/15  4:10 PM  Result Value Ref Range Status   Specimen Description BLOOD RIGHT WRIST  Final   Special Requests BOTTLES DRAWN AEROBIC ONLY 5 CC  Final   Culture   Final    NO GROWTH 5 DAYS Performed at [facility]    Report Status 08/05/2015 FINAL  Final  Culture, blood (x 2)     Status: None   Collection Time: 07/31/15  4:15 PM  Result Value Ref Range Status   Specimen Description BLOOD RIGHT HAND  Final   Special Requests BOTTLES DRAWN AEROBIC AND ANAEROBIC 5 CC  Final   Culture   Final    NO GROWTH 5 DAYS Performed at [facility]    Report Status 08/05/2015 FINAL  Final  Gastrointestinal Panel by PCR , Stool     Status: None   Collection Time: 07/31/15 10:30 PM  Result Value Ref Range Status   Campylobacter species NOT DETECTED NOT DETECTED Final   Plesimonas shigelloides NOT DETECTED NOT DETECTED Final   Salmonella species NOT DETECTED NOT DETECTED Final   Yersinia enterocolitica NOT DETECTED NOT DETECTED Final   Vibrio species NOT DETECTED NOT DETECTED Final   Vibrio cholerae NOT DETECTED NOT DETECTED Final   Enteroaggregative E coli (EAEC) NOT DETECTED NOT DETECTED Final   Enteropathogenic E coli (EPEC) NOT DETECTED NOT DETECTED Final   Enterotoxigenic E coli (ETEC) NOT DETECTED NOT DETECTED Final  Shiga like toxin producing E coli (STEC) NOT DETECTED NOT DETECTED Final   E. coli O157 NOT DETECTED NOT DETECTED Final   Shigella/Enteroinvasive E coli (EIEC) NOT DETECTED NOT DETECTED Final   Cryptosporidium NOT DETECTED NOT DETECTED Final   Cyclospora cayetanensis NOT DETECTED NOT DETECTED Final   Entamoeba histolytica NOT DETECTED NOT DETECTED Final   Giardia lamblia NOT DETECTED NOT DETECTED Final   Adenovirus F40/41 NOT DETECTED NOT DETECTED Final   Astrovirus NOT  DETECTED NOT DETECTED Final   Norovirus GI/GII NOT DETECTED NOT DETECTED Final   Rotavirus A NOT DETECTED NOT DETECTED Final   Sapovirus (I, II, IV, and V) NOT DETECTED NOT DETECTED Final  C difficile quick scan w PCR reflex     Status: None   Collection Time: 07/31/15 10:30 PM  Result Value Ref Range Status   C Diff antigen NEGATIVE NEGATIVE Final   C Diff toxin NEGATIVE NEGATIVE Final   C Diff interpretation Negative for toxigenic C. difficile  Final    Coagulation Studies: No results for input(s): LABPROT, INR in the last 72 hours.  Imaging: No results found.  Assessment: 83 year old female hospice patient with new onset LUE jerking and AMS 1. Clinical picture most consistent with new onset seizure. 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 May be secondary to new metastatic lesion.  2. Atrial fibrillation, on Xarelto.  3. CT head reveals diffuse leukoaraiosis, now significantly more prominent than on prior CT head from December 2015.  4. Keppra load has diminished the amplitude of her LUE jerking, without complete resolution.  5. Rectal adenocarcinoma with liver metastases.   Recommendations: 1. Ativan 2 mg IV x 1 now and PRN seizures.  2. Keppra 1000 mg IV x 1 has been administered. Continue with scheduled Keppra at 500 mg IV BID.  3. Load Depakote 20 mg/kg IV. 4. Continue Xarelto.  5. Cardiac telemetry.  6. Regarding possible additional anticonvulsants or increasing the dose of Keppra and Depakote, will need to base decisions upon her clinical examinations and EEG results. Will not be able to use propofol or Versed for sedation and/or establishment of burst suppression as she is DNR and her nephew does not wish her to be intubated.  7. Hospice patient.  8. EEG.  9. Seizure and falls risk precautions. 10. MRI brain when stable.   Electronically signed: Dr. Kerney Elbe 04/11/2016, 1:35 AM

## 2016-04-11 NOTE — ED Triage Notes (Signed)
Originally paged out as a code stroke but this was cancelled upon arrival to the ED by Dr. Cheral Marker, Neurologist. Per family pt was acting not herself at suppertime tonight, around 7pm and at midnight was noted to have left gaze and tremor on the left side.  EMS stated pt had obvious facial droop on the left upon their arrival.  Pt is a hospice pt with history of colon cancer with metastasis to the liver. Pt lives with her nephew.  Facial droop appears to have resolved but left-sided tremor remains.

## 2016-04-11 NOTE — ED Provider Notes (Signed)
[name] DEPT Provider Note   CSN: [id number] Arrival date & time: 04/11/16  0135  By signing my name below, I, [name], attest that this documentation has been prepared under the direction and in the presence of Delora Fuel, MD. Electronically Signed: [name], Scribe. 04/11/16. 2:06 AM.  History   Chief Complaint Chief Complaint  Patient presents with  . Seizures   LEVEL 5 CAVEAT DUE TO MENTAL STATUS   HPI Comments: [name] is a 83 y.o. female with a history of a pulmonary embolism, atrial fibrillation with RVR, and renal cancer, who presents to the Emergency Department via EMS, complaining of sudden-onset left sided weakness that began a few days ago. Per son, the patient has been having stroke-like symptoms. Per son, the patient was unable to grip correctly with left hand or bring a fork to her mouth with left hand. Patient woke up at [place] and was unable to un-grip her left hand or move her left arm, left eye shut, and making unusual mouth expression. Patient ambulates with a walker. Per son, she had a TIA a few years ago. Patient is unresponsive.   The history is provided by the patient. No language interpreter was used.    Past Medical History:  Diagnosis Date  . Acute encephalopathy 12/08/2013  . Anxiety   . Arthritis   . Cancer ([place]) 09/29/13   rectal adenocarcinoma  . Clostridium difficile colitis 12/08/2013  . Cor pulmonale, acute ([place]) 01/02/2012  . Depression   . Dysphonia, spasmodic 05/18/2014  . Fall from standing 01/22/2014  . Frequent bowel movements   . GERD (gastroesophageal reflux disease)   . History of skin cancer    removed from forehead  . Hx of radiation therapy 10/25/13-11/28/13   pelvis, 4500 cGy in 25 sessions  . Hx: UTI (urinary tract infection)   . Hypercholesteremia   . Hypertension    stopped med during chemo /radiation - BP has been stable  . Hypokalemia 11/28/2013  . Hypothyroidism   . Incontinence of urine   . Migraine     . Numbness    "I have no feeling in my left leg" unknown cause  . Ovarian tumor    sees dr Lisbeth Renshaw ob-gyn yearly for evaluation  . Pulmonary embolism ([place]) 01/01/2012  / 11/28/2013  . Rectal cancer ([place]) 09/29/13   invasive adeocarcinoma  . Rectal cancer ([place])   . Rectal mass   . Rosacea   . Severe sepsis with septic shock ([place]) 11/28/2013  . UTI (lower urinary tract infection) 01/23/2014    Patient Active Problem List   Diagnosis Date Noted  . Goals of care, counseling/discussion 02/20/2016  . C. difficile colitis 07/31/2015  . Generalized weakness   . Hypokalemia 05/18/2014  . Hypomagnesemia 05/18/2014  . Dysphonia, spasmodic 05/18/2014  . Rectal cancer metastasized to liver ([place]) 05/17/2014  . Lumbar radicular pain 03/15/2014  . Anxiety and depression   . Atrial fibrillation with RVR ([place]) 12/08/2013  . Anemia in neoplastic disease 12/08/2013  . Rash and nonspecific skin eruption 12/04/2013  . Protein-calorie malnutrition, moderate ([place]) 11/29/2013  . Hypothyroidism 11/28/2013  . Rectal cancer s/p LAResectin/colostomy 05/17/2014 10/11/2013  . Ovarian mass, right, s/p robotic excision 05/17/2014 04/14/2012  . Pulmonary embolism ([place]) 01/01/2012  . Gastroesophageal reflux disease 01/01/2012  . Hypertension 01/01/2012  . Dyslipidemia 01/01/2012    Past Surgical History:  Procedure Laterality Date  . ABDOMINAL HYSTERECTOMY     years ago, prolapsed uterus  . CATARACT EXTRACTION Bilateral   .  COLONOSCOPY N/A 09/22/2013   Procedure: COLONOSCOPY;  Surgeon: Beryle Beams, MD;  Location: [facility];  Service: Endoscopy;  Laterality: N/A;  . EUS N/A 09/29/2013   Procedure: LOWER ENDOSCOPIC ULTRASOUND (EUS);  Surgeon: Beryle Beams, MD;  Location: [facility];  Service: Endoscopy;  Laterality: N/A;  . FLEXIBLE SIGMOIDOSCOPY N/A 08/10/2015   Procedure: FLEXIBLE SIGMOIDOSCOPY;  Surgeon: Carol [place], MD;  Location: [facility];  Service: Endoscopy;  Laterality: N/A;  . OVARIAN CYST  SURGERY  yrs ago    OB History    No data available       Home Medications    Prior to Admission medications   Medication Sig Start Date End Date Taking? Authorizing Provider  acetaminophen (TYLENOL) 325 MG tablet Take 2 tablets (650 mg total) by mouth every 6 (six) hours as needed for mild pain (or Fever >/= 101). 01/25/14   Delfina Redwood, MD  cholecalciferol (VITAMIN D) 1000 UNITS tablet Take 2,000 Units by mouth daily.    Historical Provider, MD  clobetasol cream (TEMOVATE) [id number] % Apply 1 application topically 2 (two) times daily. 04/02/15   Wyatt Portela, MD  feeding supplement (BOOST HIGH PROTEIN) LIQD Take 1 Container by mouth 3 (three) times daily as needed (appetite).    Historical Provider, MD  feeding supplement, ENSURE ENLIVE, (ENSURE ENLIVE) LIQD Take 237 mLs by mouth 3 (three) times daily between meals. 08/12/15   Nishant Dhungel, MD  folic acid (FOLVITE) [id number] MCG tablet Take 800 mcg by mouth daily.    Historical Provider, MD  gabapentin (NEURONTIN) 100 MG capsule Take 1 capsule (100 mg total) by mouth 3 (three) times daily. 03/08/15   Melvenia Beam, MD  levothyroxine (SYNTHROID, LEVOTHROID) 25 MCG tablet Take 25 mcg by mouth See admin instructions. 25 mcg  on Monday, Wednesday, Friday and Sunday.    Historical Provider, MD  levothyroxine (SYNTHROID, LEVOTHROID) 75 MCG tablet Take 1 tablet by mouth See admin instructions. 30mcg on Tuesday, Thursday, Saturday. 02/22/14   Historical Provider, MD  magnesium chloride (SLOW-MAG) 64 MG TBEC SR tablet Take 2 tablets (128 mg total) by mouth daily. 08/12/15   Nishant Dhungel, MD  ondansetron (ZOFRAN) 8 MG tablet Take 1 tablet (8 mg total) by mouth every 8 (eight) hours as needed for nausea or vomiting. 06/03/15   Maryanna Shape, NP  PEDIALYTE (PEDIALYTE) SOLN Take 240 mLs by mouth 3 (three) times daily as needed (poor appetite).    Historical Provider, MD  Probiotic Product (RISA-BID PROBIOTIC PO) Take 1 tablet by mouth 2 (two) times  daily. 11/22/14   Historical Provider, MD  prochlorperazine (COMPAZINE) 10 MG tablet Take 1 tablet (10 mg total) by mouth every 6 (six) hours as needed for nausea or vomiting. 06/03/15   Maryanna Shape, NP  rivaroxaban (XARELTO) 20 MG TABS tablet Take 1 tablet (20 mg total) by mouth daily with supper. 12/21/13   Maryann Mikhail, DO  saccharomyces boulardii (FLORASTOR) 250 MG capsule Take 1 capsule (250 mg total) by mouth 2 (two) times daily. 03/30/16   Wyatt Portela, MD  vancomycin (VANCOCIN) 125 MG capsule TK 1 C PO QID FOR 14 DAYS 03/13/16   Historical Provider, MD  vitamin B-12 (CYANOCOBALAMIN) 1000 MCG tablet Place 2,000 mcg under the tongue daily.    Historical Provider, MD    Family History Family History  Problem Relation Age of Onset  . Brain cancer Mother   . Heart attack Father   . Diabetes Sister   .  Heart disease Sister     Social History Social History  Substance Use Topics  . Smoking status: Never Smoker  . Smokeless tobacco: Never Used  . Alcohol use No     Allergies   Cataflam [diclofenac]; Cephalosporins; Erythromycin; Keflex [cephalexin]; Propoxyphene; Codeine; Naproxen; Penicillins; Synalgos-dc [aspirin-caff-dihydrocodeine]; and Ultram [tramadol]   Review of Systems Review of Systems  Unable to perform ROS: Mental status change     Physical Exam Updated Vital Signs There were no vitals taken for this visit.  Physical Exam  Constitutional: She is oriented to person, place, and time. She appears well-developed and well-nourished.  Cachetic and ill appearing.  HENT:  Head: Normocephalic and atraumatic.  Eyes: EOM are normal. Pupils are equal, round, and reactive to light.  Neck: Normal range of motion. Neck supple. No JVD present.  Cardiovascular: Normal rate, regular rhythm and normal heart sounds.   No murmur heard. Pulmonary/Chest: Effort normal and breath sounds normal. She has no wheezes. She has no rales. She exhibits no tenderness.  Abdominal:  Soft. Bowel sounds are normal. She exhibits no distension and no mass. There is no tenderness.  Colostomy in place.   Musculoskeletal: Normal range of motion. She exhibits edema.  2-3+ pretibial edema extending up into the thigh. No presacral edema.   Lymphadenopathy:    She has no cervical adenopathy.  Neurological: She is alert and oriented to person, place, and time. No cranial nerve deficit. She exhibits normal muscle tone. Coordination normal.  Focal seizures ongoing involving the left side of the body. Eyes deviate to the left. Poorly responsive to verbal stimuli. No spontaneous speech.   Skin: Skin is warm and dry. No rash noted.  Nursing note and vitals reviewed.    ED Treatments / Results   COORDINATION OF CARE: 2:06 AM Discussed treatment plan with pt at bedside and pt agreed to plan, which includes seizure medication.   Labs (all labs ordered are listed, but only abnormal results are displayed) Labs Reviewed  PROTIME-INR - Abnormal; Notable for the following:       Result Value   Prothrombin Time 36.0 (*)    All other components within normal limits  APTT - Abnormal; Notable for the following:    aPTT 47 (*)    All other components within normal limits  CBC - Abnormal; Notable for the following:    RBC 3.11 (*)    Hemoglobin 10.4 (*)    HCT 32.7 (*)    MCV 105.1 (*)    All other components within normal limits  DIFFERENTIAL - Abnormal; Notable for the following:    Neutro Abs 8.0 (*)    Lymphs Abs 0.5 (*)    All other components within normal limits  COMPREHENSIVE METABOLIC PANEL - Abnormal; Notable for the following:    Chloride 99 (*)    Glucose, Bld 114 (*)    Total Protein 5.7 (*)    Albumin 2.6 (*)    Alkaline Phosphatase 265 (*)    All other components within normal limits  MAGNESIUM - Abnormal; Notable for the following:    Magnesium 1.5 (*)    All other components within normal limits  CBG MONITORING, ED - Abnormal; Notable for the following:     Glucose-Capillary 106 (*)    All other components within normal limits  I-STAT CHEM 8, ED - Abnormal; Notable for the following:    Chloride 98 (*)    Glucose, Bld 112 (*)    Calcium, Ion 1.10 (*)  Hemoglobin 10.2 (*)    HCT 30.0 (*)    All other components within normal limits  I-STAT TROPOININ, ED    EKG  EKG Interpretation  Date/Time:  Saturday April 11 2016 01:53:54 EST Ventricular Rate:  101 PR Interval:    QRS Duration: 84 QT Interval:  353 QTC Calculation: 458 R Axis:   79 Text Interpretation:  Sinus tachycardia Atrial premature complexes Otherwise within normal limits When compared with ECG of 08/10/2015, No significant change was found Confirmed by [facility]  MD, Glennie Bose ([id number]) on 04/11/2016 2:06:54 AM       Radiology Ct Head Code Stroke W/o Cm  Result Date: 04/11/2016 CLINICAL DATA:  Code stroke.  Left-sided weakness EXAM: CT HEAD WITHOUT CONTRAST TECHNIQUE: Contiguous axial images were obtained from the base of the skull through the vertex without intravenous contrast. COMPARISON:  Head CT 01/22/2014 FINDINGS: Brain: There is extensive confluent hypoattenuation within the periventricular, deep and subcortical white matter. Gray-white differentiation is maintained. The basal ganglia and insular ribbons are preserved. There is no acute hemorrhage. No midline shift or mass effect. The white matter findings have progressed substantially compared to the study of 01/22/2014. Vascular: No hyperdense vessel or unexpected calcification. Skull: Normal. Negative for fracture or focal lesion. Sinuses/Orbits: No acute finding. Other: None. ASPECTS [facility] Stroke Program Early CT Score) - Ganglionic level infarction (caudate, lentiform nuclei, internal capsule, insula, M1-M3 cortex): 7 - Supraganglionic infarction (M4-M6 cortex): 3 Total score (0-10 with 10 being normal): 10 IMPRESSION: 1. No acute intracranial hemorrhage. 2. Confluent bilateral white matter hypoattenuation without CT evidence  for acute cortical infarct. The degree of white matter disease has progressed compared to the prior study. While this is favored to indicate chronic microvascular ischemia, PRES may have the same appearance. 3. ASPECTS is 10. These results were called by telephone at the time of interpretation on 04/11/2016 at 2:09 am to Dr. Kerney Elbe, who verbally acknowledged these results. Electronically Signed   By: Ulyses Jarred M.D.   On: 04/11/2016 02:10    Procedures Procedures (including critical care time) CRITICAL CARE Performed by: [id number] Total critical care time: 60 minutes Critical care time was exclusive of separately billable procedures and treating other patients. Critical care was necessary to treat or prevent imminent or life-threatening deterioration. Critical care was time spent personally by me on the following activities: development of treatment plan with patient and/or surrogate as well as nursing, discussions with consultants, evaluation of patient's response to treatment, examination of patient, obtaining history from patient or surrogate, ordering and performing treatments and interventions, ordering and review of laboratory studies, ordering and review of radiographic studies, pulse oximetry and re-evaluation of patient's condition.  Medications Ordered in ED Medications  LORazepam (ATIVAN) injection 1 mg (not administered)  levETIRAcetam (KEPPRA) 1,000 mg in sodium chloride 0.9 % 100 mL IVPB (1,000 mg Intravenous New Bag/Given 04/11/16 0151)  LORazepam (ATIVAN) injection 2 mg (2 mg Intravenous Given 04/11/16 0147)     Initial Impression / Assessment and Plan / ED Course  I have reviewed the triage vital signs and the nursing notes.  Pertinent labs & imaging results that were available during my care of the patient were reviewed by me and considered in my medical decision making (see chart for details).  Focal seizure. Old records are reviewed, and she is DO NOT RESUSCITATE  based on progressive metastatic disease from primary rectal cancer. She is under hospice care. She was brought in by ambulance as a code stroke, but kosher but  was canceled because of ongoing seizure activity. She had been seen by Dr. Cheral Marker, neuro-hospitalist, who gave her lorazepam 2 mg. In spite of this, she continued to seize. She is given additional lorazepam and levetiracetam but continued to have seizure activity. She was maintaining adequate oxygen saturations, but was not responsive. Nasal trumpet was inserted. Family members present who reaffirmed DO NOT RESUSCITATE status and patient's desire not to be on life support measures such as ventilators. She is given valproic acid, and seizure activity has stopped although she is still somnolent. This could be part of a postictal state, or related to the benzodiazepines she received. CT of head showed no acute process and no evidence of metastatic disease. Magnesium level is come back low, but I doubt this is actually contributing to her seizure. She is given magnesium replacement. Case is discussed with Dr. Myna Hidalgo of [facility] hospitalists who agrees to admit the patient.  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 The recorded information has been reviewed and is accurate.       Delora Fuel, MD [id number]

## 2016-04-11 NOTE — Progress Notes (Signed)
[facility]03-[facility]  - GIP RN visit at 0900a.m.  This is a covered and related GIP admission from 04/11/16 to [facility] diagnosis of Rectal cancer (C20) with liver mets (C78.7), lung mets (C78.0) and peritoneal mets (C78.6) per Dr. Karie Georges.  Patient is DNR.  Patient's nephew had noticed patient having AMS, L sided jerking, and patient couldn't stand.  Lestine Mount, nephew, called [facility] and talked with RN, who agreed that he needed to initiate EMS.   EMS transported patient to [facility] Emergency Department.   Patient was subsequently admitted to room [facility] 3M03-01.  Visited patient in room, no other family members at bedside.  I spoke with staff RN at bedside.   Patient currently has nasal trumpet to protect airway.  Patient was resting quietly and in NAD.   Per RN, patient does have some skin breakdown upon arriving with Stage I sacral wound, and venous stasis wounds to bilateral feet.  Patient also has bilateral lower extremity edema.  Noone from patient's family in the room with patient at this time.  Staff RN advised that nephew "went home to get some rest".   Patient received levETIRAcetam (Keppra) 1000mg  in Sodium Chloride 0.9% 171mL IVPP, Dose 1000mg  via IV; lorazepam (Ativan) injection 1mg , Dose 1mg  via IV; lorazepam (Ativan) injection 2mg , Dose 2mg  via IV and Magnesium sulfate IVPB 2g 49mL, Dose 2g via IV.  Continuous infusion:  0.9% sodium chloride infusion, Rate 75 mL/hr.  Per staff RN, patient is anticipated to get MRI and EEG later today.  Updated medication list and transfrer summary placed in chart.    [facility] will continue to follow daily and anticipate discharge needs.  Please feel free to contact me with any hospice related questions or concerns.  Thank you,  Edyth Gunnels, RN, [facility] Liaison [phone]  All hospital liaison's are now on [place].

## 2016-04-11 NOTE — H&P (Signed)
History and Physical    Bethany [name] [id number] DOB: Jun 05, 1928 DOA: 04/11/2016  PCP: Bethany Gravel, MD   Patient coming from: Home  Chief Complaint: Left arm tremor and contractions, unresponsiveness  HPI: Bethany [name] is a 83 y.o. female with medical history significant for paroxysmal atrial fibrillation, history of PE, hypertension, hypothyroidism, depression, anxiety, and rectal cancer with metastases to the liver and lung now under hospice care, presenting to the emergency department with a few days of left sided tremors and involuntary contractions, now worsened acutely in accompanied by unresponsiveness. Patient lives with her nephew who provides the history. Patient had been under treatment for rectal cancer which has metastasized to the liver and lungs, but unfortunately her disease has continued to progress and she has recently been enrolled in hospice. Her nephew notes that over the past couple days, she has had a tremor involving the left arm and seems to have involuntary spasms and contractions of the left hand and arm. He is also noticed abnormal movements in the left face and notes that she has been closing her left eye tightly. The symptoms had been intermittent over the past few days, but tonight at dinner, she was unable to feed herself as usual because of left arm jerking. She was then noted to have a fixed leftward gaze and she stopped responding. She had not exhibited similar symptoms previously.  ED Course: Upon arrival to the ED, patient is found to be saturating adequately on room air, and with vitals stable. Code stroke had been activated initially, but was canceled on arrival as the patient was noted to be seizing. Her EKG feature to sinus tachycardia with PACs and noncontrast head CT was negative for acute hemorrhage, but notable for confluent bilateral white matter hypoattenuation without evidence for acute cortical infarct, but demonstrating progression compared to prior studies  and favored to represent chronic microvascular ischemia. Chemistry panel features elevations in alkaline phosphatase and a low albumin. CBC is notable for a stable macrocytic anemia with hemoglobin 10.4. INR is elevated to 3.50, higher than priors. Patient was treated with 1 g of Keppra in the ED and 1 mg of Ativan. She continued to seize and was given a 2 mg IV push of Ativan. Depakote was also ordered and was started at that time. Clinical seizure was apparently aborted with the second dose of Ativan. There was concern for the patient's ability to protect her airway and the ED physician discussed endotracheal intubation with the patient's nephew. Patient's nephew notes that [name] had made her end-of-life wishes clear and does not want to be intubated or undergo cardiopulmonary resuscitation. Her wishes were respected. She has remained hemodynamically stable in the ED and is maintaining O2 saturations at this time, but remains unresponsive. She will be admitted to the stepdown unit for ongoing evaluation and management of apparent status epilepticus.  Review of Systems:  All other systems reviewed and apart from HPI, are negative.  Past Medical History:  Diagnosis Date  . Acute encephalopathy 12/08/2013  . Anxiety   . Arthritis   . Cancer ([place]) 09/29/13   rectal adenocarcinoma  . Clostridium difficile colitis 12/08/2013  . Cor pulmonale, acute ([place]) 01/02/2012  . Depression   . Dysphonia, spasmodic 05/18/2014  . Fall from standing 01/22/2014  . Frequent bowel movements   . GERD (gastroesophageal reflux disease)   . History of skin cancer    removed from forehead  . Hx of radiation therapy 10/25/13-11/28/13   pelvis, 4500 cGy  in 25 sessions  . Hx: UTI (urinary tract infection)   . Hypercholesteremia   . Hypertension    stopped med during chemo /radiation - BP has been stable  . Hypokalemia 11/28/2013  . Hypothyroidism   . Incontinence of urine   . Migraine   . Numbness    "I have no  feeling in my left leg" unknown cause  . Ovarian tumor    sees dr Bethany [name] ob-gyn yearly for evaluation  . Pulmonary embolism ([place]) 01/01/2012  / 11/28/2013  . Rectal cancer ([place]) 09/29/13   invasive adeocarcinoma  . Rectal cancer ([place])   . Rectal mass   . Rosacea   . Severe sepsis with septic shock ([place]) 11/28/2013  . UTI (lower urinary tract infection) 01/23/2014    Past Surgical History:  Procedure Laterality Date  . ABDOMINAL HYSTERECTOMY     years ago, prolapsed uterus  . CATARACT EXTRACTION Bilateral   . COLONOSCOPY N/A 09/22/2013   Procedure: COLONOSCOPY;  Surgeon: Beryle Beams, MD;  Location: [facility];  Service: Endoscopy;  Laterality: N/A;  . EUS N/A 09/29/2013   Procedure: LOWER ENDOSCOPIC ULTRASOUND (EUS);  Surgeon: Beryle Beams, MD;  Location: [facility];  Service: Endoscopy;  Laterality: N/A;  . FLEXIBLE SIGMOIDOSCOPY N/A 08/10/2015   Procedure: FLEXIBLE SIGMOIDOSCOPY;  Surgeon: Carol Ada, MD;  Location: [facility];  Service: Endoscopy;  Laterality: N/A;  . OVARIAN CYST SURGERY  yrs ago     reports that she has never smoked. She has never used smokeless tobacco. She reports that she does not drink alcohol or use drugs.  Allergies  Allergen Reactions  . Cataflam [Diclofenac] Other (See Comments)    Unknown; patient is unaware of allergy.  . Cephalosporins Shortness Of Breath  . Erythromycin Shortness Of Breath  . Keflex [Cephalexin] Shortness Of Breath  . Propoxyphene     Unknown rxn  . Codeine Nausea And Vomiting  . Naproxen Other (See Comments)    Unknown; patient is unaware of allergy  . Penicillins Other (See Comments)    Patient states that she CAN take this; unaware of allergy, pt/family is unable to answer penicillin related questions as they do not recall this allergy reaction at all  . Synalgos-Dc [Aspirin-Caff-Dihydrocodeine] Other (See Comments)    Unknown; patient is unaware of allergy  . Ultram [Tramadol] Other (See Comments)    Unknown;  patient is unaware of allergy    Family History  Problem Relation Age of Onset  . Brain cancer Mother   . Heart attack Father   . Diabetes Sister   . Heart disease Sister      Prior to Admission medications   Medication Sig Start Date End Date Taking? Authorizing Provider  acetaminophen (TYLENOL) 325 MG tablet Take 2 tablets (650 mg total) by mouth every 6 (six) hours as needed for mild pain (or Fever >/= 101). 01/25/14  Yes Delfina Redwood, MD  cholecalciferol (VITAMIN D) 1000 UNITS tablet Take 2,000 Units by mouth daily.   Yes Historical Provider, MD  folic acid (FOLVITE) A999333 MCG tablet Take 800 mcg by mouth daily.   Yes Historical Provider, MD  gabapentin (NEURONTIN) 100 MG capsule Take 1 capsule (100 mg total) by mouth 3 (three) times daily. 03/08/15  Yes Melvenia Beam, MD  levothyroxine (SYNTHROID, LEVOTHROID) 25 MCG tablet Take 25 mcg by mouth See admin instructions. 25 mcg  on Monday, Wednesday, Friday and Sunday.   Yes Historical Provider, MD  levothyroxine (SYNTHROID, LEVOTHROID) 75 MCG tablet  Take 1 tablet by mouth See admin instructions. 43mcg on Tuesday, Thursday, Saturday. 02/22/14  Yes Historical Provider, MD  magnesium chloride (SLOW-MAG) 64 MG TBEC SR tablet Take 2 tablets (128 mg total) by mouth daily. 08/12/15  Yes Nishant Dhungel, MD  ondansetron (ZOFRAN) 8 MG tablet Take 1 tablet (8 mg total) by mouth every 8 (eight) hours as needed for nausea or vomiting. 06/03/15  Yes Maryanna Shape, NP  PEDIALYTE (PEDIALYTE) SOLN Take 240 mLs by mouth 3 (three) times daily as needed (poor appetite).   Yes Historical Provider, MD  Probiotic Product (RISA-BID PROBIOTIC PO) Take 1 tablet by mouth 2 (two) times daily. 11/22/14  Yes Historical Provider, MD  prochlorperazine (COMPAZINE) 10 MG tablet Take 1 tablet (10 mg total) by mouth every 6 (six) hours as needed for nausea or vomiting. 06/03/15  Yes Maryanna Shape, NP  rivaroxaban (XARELTO) 20 MG TABS tablet Take 1 tablet (20 mg total)  by mouth daily with supper. 12/21/13  Yes Maryann Mikhail, DO  saccharomyces boulardii (FLORASTOR) 250 MG capsule Take 1 capsule (250 mg total) by mouth 2 (two) times daily. 03/30/16  Yes Wyatt Portela, MD  vitamin B-12 (CYANOCOBALAMIN) 1000 MCG tablet Place 2,000 mcg under the tongue daily.   Yes Historical Provider, MD    Physical Exam: Vitals:   04/11/16 0200 04/11/16 0215  BP: 122/73 124/71  Pulse: 108 86  Resp: 21 26  SpO2: 94% 94%      Constitutional: Obtunded, cachectic, no pallor or diaphoresis Eyes: PERTLA, lids and conjunctivae normal ENMT: Mucous membranes are dry. Posterior pharynx clear of any exudate or lesions.   Neck: normal, supple, no masses, no thyromegaly Respiratory: clear to auscultation bilaterally, no wheezing, no crackles. No accessory muscle use.  Cardiovascular: S1 & S2 heard, regular rate and rhythm. BLE edema to thighs.   Abdomen: No distension, no tenderness, no masses palpated. Bowel sounds normal.  Musculoskeletal: no clubbing / cyanosis. No joint deformity upper and lower extremities.   Skin: no significant rashes, lesions, ulcers. Warm, dry, well-perfused. Neurologic: No gross facial asymmetry. PERRL. Minimal response to noxious stimuli.   Psychiatric: Difficult to assess secondary to clinical condition.     Labs on Admission: I have personally reviewed following labs and imaging studies  CBC:  Recent Labs Lab 04/11/16 0128 04/11/16 0135  WBC 9.5  --   NEUTROABS 8.0*  --   HGB 10.4* 10.2*  HCT 32.7* 30.0*  MCV 105.1*  --   PLT 222  --    Basic Metabolic Panel:  Recent Labs Lab 04/11/16 0128 04/11/16 0135 04/11/16 0204  NA 137 136  --   K 3.9 3.9  --   CL 99* 98*  --   CO2 27  --   --   GLUCOSE 114* 112*  --   BUN 14 16  --   CREATININE 0.75 0.70  --   CALCIUM 9.1  --   --   MG  --   --  1.5*   GFR: CrCl cannot be calculated (Unknown ideal weight.). Liver Function Tests:  Recent Labs Lab 04/11/16 0128  AST 35  ALT  16  ALKPHOS 265*  BILITOT 0.7  PROT 5.7*  ALBUMIN 2.6*   No results for input(s): LIPASE, AMYLASE in the last 168 hours. No results for input(s): AMMONIA in the last 168 hours. Coagulation Profile:  Recent Labs Lab 04/11/16 0128  INR 3.50   Cardiac Enzymes: No results for input(s): CKTOTAL, CKMB, CKMBINDEX, TROPONINI in  the last 168 hours. BNP (last 3 results) No results for input(s): PROBNP in the last 8760 hours. HbA1C: No results for input(s): HGBA1C in the last 72 hours. CBG:  Recent Labs Lab 04/11/16 0152  GLUCAP 106*   Lipid Profile: No results for input(s): CHOL, HDL, LDLCALC, TRIG, CHOLHDL, LDLDIRECT in the last 72 hours. Thyroid Function Tests: No results for input(s): TSH, T4TOTAL, FREET4, T3FREE, THYROIDAB in the last 72 hours. Anemia Panel: No results for input(s): VITAMINB12, FOLATE, FERRITIN, TIBC, IRON, RETICCTPCT in the last 72 hours. Urine analysis:    Component Value Date/Time   COLORURINE YELLOW 07/31/2015 [place] 07/31/2015 1456   LABSPEC 1.020 07/31/2015 1456   PHURINE 5.5 07/31/2015 1456   GLUCOSEU NEGATIVE 07/31/2015 1456   HGBUR SMALL (A) 07/31/2015 1456   BILIRUBINUR SMALL (A) 07/31/2015 1456   KETONESUR NEGATIVE 07/31/2015 1456   PROTEINUR NEGATIVE 07/31/2015 1456   UROBILINOGEN 0.2 01/22/2014 1930   NITRITE NEGATIVE 07/31/2015 1456   LEUKOCYTESUR SMALL (A) 07/31/2015 1456   Sepsis Labs: @LABRCNTIP (procalcitonin:4,lacticidven:4) )No results found for this or any previous visit (from the past 240 hour(s)).   Radiological Exams on Admission: Ct Head Code Stroke W/o Cm  Result Date: 04/11/2016 CLINICAL DATA:  Code stroke.  Left-sided weakness EXAM: CT HEAD WITHOUT CONTRAST TECHNIQUE: Contiguous axial images were obtained from the base of the skull through the vertex without intravenous contrast. COMPARISON:  Head CT 01/22/2014 FINDINGS: Brain: There is extensive confluent hypoattenuation within the periventricular, deep  and subcortical white matter. Gray-white differentiation is maintained. The basal ganglia and insular ribbons are preserved. There is no acute hemorrhage. No midline shift or mass effect. The white matter findings have progressed substantially compared to the study of 01/22/2014. Vascular: No hyperdense vessel or unexpected calcification. Skull: Normal. Negative for fracture or focal lesion. Sinuses/Orbits: No acute finding. Other: None. ASPECTS [facility] Stroke Program Early CT Score) - Ganglionic level infarction (caudate, lentiform nuclei, internal capsule, insula, M1-M3 cortex): 7 - Supraganglionic infarction (M4-M6 cortex): 3 Total score (0-10 with 10 being normal): 10 IMPRESSION: 1. No acute intracranial hemorrhage. 2. Confluent bilateral white matter hypoattenuation without CT evidence for acute cortical infarct. The degree of white matter disease has progressed compared to the prior study. While this is favored to indicate chronic microvascular ischemia, PRES may have the same appearance. 3. ASPECTS is 10. These results were called by telephone at the time of interpretation on 04/11/2016 at 2:09 am to Dr. Kerney Elbe, who verbally acknowledged these results. Electronically Signed   By: Ulyses Jarred M.D.   On: 04/11/2016 02:10    EKG: Independently reviewed. Sinus tachycardia, PAC's   Assessment/Plan  1. Complex partial status epilepticus  - Presents in apparent status epilepticus; no prior szr hx  - Neurology is consulting and much appreciated  - She was treated with Ativan 2 mg IVP, loaded with Keppra 1 g IV, and Depakote 20 mg/kg IV prior to cessation of clinical seizure activity  - Plan to monitor on telemetry, obtain EEG and MRI, continue Keppra 500 mg IV BID  - Continue seizure precautions, follow-up on additional neuro recs   2. Rectal cancer with metastases   - Unfortunately, her disease has continue to progress despite treatment and she has recently been enrolled in hospice  -  Palliative care consultation requested    3. Atrial fibrillation  - In and out of A fib in ED  - CHADS-VASc is at least 27 (age x2, gender)  - She is anticoagulated  with Xarelto; not on rate- or rhythm-control agent - Monitoring on telemetry   4. Macrocytic anemia  - Hgb is stable on admission at 10.4 with no s/s of bleeding  - Continue B12 supplements as tolerated    5. Anxiety and depression  - Unable to assess on admission d/t clinical condition   6. Hypothyroidism  - Synthroid can be resumed when she is appropriate for a diet    7. Coagulopathy  - INR is 3.50 on admission; possibly secondary to liver mets, Xarelto  - No s/s of bleeding, will monitor     DVT prophylaxis: Xarelto  Code Status: DNR  Family Communication: Nephew updated at bedside Disposition Plan: Admit to SDU Consults called: Neurology Admission status: Inpatient    Vianne Bulls, MD [facility] Pager [phone]  If 7PM-7AM, please contact night-coverage [email or web address] Password [facility]  04/11/2016, 3:48 AM

## 2016-04-11 NOTE — Consult Note (Signed)
Consultation Note Date: 04/11/2016   Patient Name: Bethany [name]  DOB: 1929/01/12  MRN: [id number]  Age / Sex: 83 y.o., female  PCP: [name], MD Referring Physician: Velvet Bathe, MD  Reason for Consultation: Establishing goals of care, Non pain symptom management, Pain control, Psychosocial/spiritual support and Terminal Care  HPI/Patient Profile: 83 y.o. female  with past medical history of Atrial fibrillation, history of PE, hypertension, hypothyroidism, depression, anxiety, rectal cancer with metastatic disease to liver and lung now under the care of hospice and palliative care of [facility] admitted on 04/11/2016 with new onset of left-sided tremors and involuntary contractions. Per nephew who is her caregiver states it dramatically worsened over the past several days and ultimately she developed a fixed leftward gaze, and stopped responding. She was given Keppra in the emergency room. Question if patient is having seizures. Hospice and palliative care of [facility] where that she is in the hospital..   Clinical Assessment and Goals of Care: Patient now is minimally responsive. Her feet and knees are heavily mottled. Respirations are irregular and very shallow. There is been no further jerking, abnormal movements noted since starting on anticonvulsants. She appears to have labored breathing when you attempt to lower the head of the bed.  HCPOA which is her nephew, Lestine Mount with whom she resides    SUMMARY OF RECOMMENDATIONS   DO NOT RESUSCITATE DO NOT INTUBATE No PEG tube artificial feeding Cancel MRI Transfer out of ICU to 6 N. for palliative and comfort care Continue anticonvulsants via IV route Code Status/Advance Care Planning:  DNR    Symptom Management:   Dyspnea: Continue fentanyl 12.5-25 g every 2 hours as needed. Monitor for need for scheduled dosing  Pain: Continue fentanyl as  needed every 2 hours  Seizure: Continue Keppra IV as well as Ativan 2 mg for breakthrough signs and symptoms of abnormal movements  Palliative Prophylaxis:   Aspiration, Delirium Protocol, Eye Care, Frequent Pain Assessment, Oral Care and Turn Reposition  Additional Recommendations (Limitations, Scope, Preferences):  Minimize Medications, Initiate Comfort Feeding, No Artificial Feeding, No Blood Transfusions, No Chemotherapy, No Diagnostics, No Glucose Monitoring, No Hemodialysis, No IV Antibiotics, No Lab Draws, No Radiation, No Surgical Procedures and No Tracheostomy  Psycho-social/Spiritual:   Desire for further Chaplaincy support:no  Additional Recommendations: Grief/Bereavement Support  Prognosis:   Hours - Days  Discharge Planning: Anticipated Hospital Death      Primary Diagnoses: Present on Admission: . Rectal cancer metastasized to lung ([place]) . History of pulmonary embolus (PE) . Hypothyroidism . Hypertension . Anxiety and depression . Atrial fibrillation ([place]) . Complex partial status epilepticus ([place]) . Hypomagnesemia . Anemia in neoplastic disease . Coagulopathy ([place]) . Status epilepticus ([place])   I have reviewed the medical record, interviewed the patient and family, and examined the patient. The following aspects are pertinent.  Past Medical History:  Diagnosis Date  . Acute encephalopathy 12/08/2013  . Anxiety   . Arthritis   . Cancer ([place]) 09/29/13   rectal adenocarcinoma  . Clostridium difficile colitis  12/08/2013  . Cor pulmonale, acute ([place]) 01/02/2012  . Depression   . Dysphonia, spasmodic 05/18/2014  . Fall from standing 01/22/2014  . Frequent bowel movements   . GERD (gastroesophageal reflux disease)   . History of skin cancer    removed from forehead  . Hx of radiation therapy 10/25/13-11/28/13   pelvis, 4500 cGy in 25 sessions  . Hx: UTI (urinary tract infection)   . Hypercholesteremia   . Hypertension    stopped med during chemo  /radiation - BP has been stable  . Hypokalemia 11/28/2013  . Hypothyroidism   . Incontinence of urine   . Migraine   . Numbness    "I have no feeling in my left leg" unknown cause  . Ovarian tumor    sees dr Lisbeth Renshaw ob-gyn yearly for evaluation  . Pulmonary embolism ([place]) 01/01/2012  / 11/28/2013  . Rectal cancer ([place]) 09/29/13   invasive adeocarcinoma  . Rectal cancer ([place])   . Rectal mass   . Rosacea   . Severe sepsis with septic shock ([place]) 11/28/2013  . UTI (lower urinary tract infection) 01/23/2014   Social History   Social History  . Marital status: Single    Spouse name: N/A  . Number of children: 0  . Years of education: [employer]   Occupational History  . Retired Other   Social History Main Topics  . Smoking status: Never Smoker  . Smokeless tobacco: Never Used  . Alcohol use No  . Drug use: No  . Sexual activity: Not Asked   Other Topics Concern  . None   Social History Narrative   Patient lives at home with her nephew.   Caffeine Use: 8oz daily   Family History  Problem Relation Age of Onset  . Brain cancer Mother   . Heart attack Father   . Diabetes Sister   . Heart disease Sister    Scheduled Meds: . levETIRAcetam  500 mg Intravenous Q12H  . mouth rinse  15 mL Mouth Rinse BID  . [START ON 04/12/2016] rivaroxaban  20 mg Oral Q supper  . sodium chloride flush  3 mL Intravenous Q12H   Continuous Infusions: PRN Meds:.fentaNYL (SUBLIMAZE) injection, LORazepam, LORazepam, ondansetron **OR** ondansetron (ZOFRAN) IV Medications Prior to Admission:  Prior to Admission medications   Medication Sig Start Date End Date Taking? Authorizing Provider  acetaminophen (TYLENOL) 325 MG tablet Take 2 tablets (650 mg total) by mouth every 6 (six) hours as needed for mild pain (or Fever >/= 101). 01/25/14  Yes Delfina Redwood, MD  cholecalciferol (VITAMIN D) 1000 UNITS tablet Take 2,000 Units by mouth daily.   Yes Historical Provider, MD  folic acid (FOLVITE) [id number]  MCG tablet Take 800 mcg by mouth daily.   Yes Historical Provider, MD  gabapentin (NEURONTIN) 100 MG capsule Take 1 capsule (100 mg total) by mouth 3 (three) times daily. 03/08/15  Yes Melvenia Beam, MD  levothyroxine (SYNTHROID, LEVOTHROID) 25 MCG tablet Take 25 mcg by mouth See admin instructions. 25 mcg  on Monday, Wednesday, Friday and Sunday.   Yes Historical Provider, MD  levothyroxine (SYNTHROID, LEVOTHROID) 75 MCG tablet Take 1 tablet by mouth See admin instructions. 15mcg on Tuesday, Thursday, Saturday. 02/22/14  Yes Historical Provider, MD  magnesium chloride (SLOW-MAG) 64 MG TBEC SR tablet Take 2 tablets (128 mg total) by mouth daily. 08/12/15  Yes Nishant Dhungel, MD  ondansetron (ZOFRAN) 8 MG tablet Take 1 tablet (8 mg total) by mouth every 8 (eight) hours  as needed for nausea or vomiting. 06/03/15  Yes Maryanna Shape, NP  PEDIALYTE (PEDIALYTE) SOLN Take 240 mLs by mouth 3 (three) times daily as needed (poor appetite).   Yes Historical Provider, MD  Probiotic Product (RISA-BID PROBIOTIC PO) Take 1 tablet by mouth 2 (two) times daily. 11/22/14  Yes Historical Provider, MD  prochlorperazine (COMPAZINE) 10 MG tablet Take 1 tablet (10 mg total) by mouth every 6 (six) hours as needed for nausea or vomiting. 06/03/15  Yes Maryanna Shape, NP  rivaroxaban (XARELTO) 20 MG TABS tablet Take 1 tablet (20 mg total) by mouth daily with supper. 12/21/13  Yes Maryann Mikhail, DO  saccharomyces boulardii (FLORASTOR) 250 MG capsule Take 1 capsule (250 mg total) by mouth 2 (two) times daily. 03/30/16  Yes Wyatt Portela, MD  vitamin B-12 (CYANOCOBALAMIN) 1000 MCG tablet Place 2,000 mcg under the tongue daily.   Yes Historical Provider, MD   Allergies  Allergen Reactions  . Cataflam [Diclofenac] Other (See Comments)    Unknown; patient is unaware of allergy.  . Cephalosporins Shortness Of Breath  . Erythromycin Shortness Of Breath  . Keflex [Cephalexin] Shortness Of Breath  . Propoxyphene     Unknown  rxn  . Codeine Nausea And Vomiting  . Naproxen Other (See Comments)    Unknown; patient is unaware of allergy  . Penicillins Other (See Comments)    Patient states that she CAN take this; unaware of allergy, pt/family is unable to answer penicillin related questions as they do not recall this allergy reaction at all  . Synalgos-Dc [Aspirin-Caff-Dihydrocodeine] Other (See Comments)    Unknown; patient is unaware of allergy  . Ultram [Tramadol] Other (See Comments)    Unknown; patient is unaware of allergy   Review of Systems  Unable to perform ROS: Patient unresponsive    Physical Exam  Constitutional:  Cachectic, frail, acutely ill appearing 83 year old female  Pulmonary/Chest:  Respirations shallow, irregular  Genitourinary:  Genitourinary Comments: Foley  Neurological:  Minimally responsive; briefly opens her eyes to touch  Skin:  Cool; extensive mottling to feet and knees  Psychiatric:  Minimally responsive; briefly opens her eyes to touch  Nursing note and vitals reviewed.   Vital Signs: BP 129/65 (BP Location: Right Arm)   Pulse (!) 44   Temp 97.4 F (36.3 C) (Axillary)   Resp 20   Ht 5\' 5"  (1.651 m)   Wt 63.5 kg (139 lb 15.9 oz)   SpO2 100%   BMI 23.30 kg/m  Pain Assessment: Faces       SpO2: SpO2: 100 % O2 Device:SpO2: 100 % O2 Flow Rate: .   IO: Intake/output summary:  Intake/Output Summary (Last 24 hours) at 04/11/16 1712 Last data filed at 04/11/16 1557  Gross per 24 hour  Intake           906.75 ml  Output                0 ml  Net           906.75 ml    LBM: Last BM Date: 04/11/16 Baseline Weight: Weight: 63.5 kg (139 lb 15.9 oz) Most recent weight: Weight: 63.5 kg (139 lb 15.9 oz)     Palliative Assessment/Data:   Flowsheet Rows   Flowsheet Row Most Recent Value  Intake Tab  Referral Department  Hospitalist  Unit at Time of Referral  ICU  Palliative Care Primary Diagnosis  Cancer  Date Notified  04/11/16  Palliative Care Type  New  Palliative care  Reason for referral  Pain, Non-pain Symptom, Psychosocial or Spiritual support  Date of Admission  04/11/16  Date first seen by Palliative Care  04/11/16  # of days Palliative referral response time  0 Day(s)  # of days IP prior to Palliative referral  0  Clinical Assessment  Palliative Performance Scale Score  20%  Pain Max last 24 hours  Not able to report  Pain Min Last 24 hours  Not able to report  Dyspnea Max Last 24 Hours  Not able to report  Dyspnea Min Last 24 hours  Not able to report  Nausea Max Last 24 Hours  Not able to report  Nausea Min Last 24 Hours  Not able to report  Anxiety Max Last 24 Hours  Not able to report  Other Max Last 24 Hours  Not able to report  Psychosocial & Spiritual Assessment  Palliative Care Outcomes  Patient/Family meeting held?  Yes  Who was at the meeting?  nephew, caregiver  Palliative Care Outcomes  Improved pain interventions, Clarified goals of care, Improved non-pain symptom therapy, Provided end of life care assistance, Provided psychosocial or spiritual support, Changed to focus on comfort  Patient/Family wishes: Interventions discontinued/not started   Mechanical Ventilation, BiPAP, Hemodialysis, Transfusion, Vasopressors, Trach, NIPPV, Tube feedings/TPN, Transfer out of ICU, PEG  Palliative Care follow-up planned  Yes, Facility      Time In: 1600 Time Out: 1715 Time Total: 75 min Greater than 50%  of this time was spent counseling and coordinating care related to the above assessment and plan. Spoke with Dr. Wendee Beavers regarding patient's case as well as Dr. Leonel Ramsay with neurology. Both are in agreement to focus on comfort and transfer out of ICU. I also updated hospice and palliative care of [facility]. Dr. Leonel Ramsay did call patient's nephew who agreed with the plan to transfer to 6 N. full comfort care  Signed by: Dory Horn, NP   Please contact Palliative Medicine Team phone at [phone] for questions and  concerns.  For individual provider: See Shea Evans

## 2016-04-11 NOTE — Progress Notes (Addendum)
EEG without ongoing seizure. I discussed with nephew options moving forward. I described that with her already being on hospice, goals of treatment needed to be clarified. I explained what comfort only measures would mean(e.g. No testing, no IV fluids, etc.) but that we could continue giving seizure medications.   He agreed that he would favor comfort only measures at this time and therefore I discussed with Palliative care who will put orders for transfer from the ICU.  Please call if neurology can be of any further assistance. Neurology to sign off.   Roland Rack, MD [facility] [phone]  If 7pm- 7am, please page neurology on call as listed in [place].

## 2016-04-11 NOTE — Progress Notes (Signed)
Subjective: No further seizures, remains encephalopathic.   Family is not at the bedside.   Exam: Vitals:   04/11/16 1141 04/11/16 1200  BP:  125/64  Pulse:  63  Resp:  20  Temp: (!) 96.6 F (35.9 C)    Gen: In bed, NAD Resp: non-labored breathing, no acute distress Abd: soft, nt  Neuro: MS: openes eyes, mumbles.  [id number], blinks to threat bilaterally, fixates and trackes.  Motor: withdraws to nox stim in all 4 ext.  Sensory:as above.   Impression: 83 yo F with end stage cancer resenting with recurrent seizures. Treating seizures as a comfort measure, but will not progress to burst suppression as per Dr. Yvetta Coder conversation with son. If EEG shows continued seizure, may need to discuss other options for palliation. Unable to tolerate MRI, would not continue to attempt.   Recommendations: 1) EEG 2) Keppra 500 mg twice a day  Roland Rack, MD [facility] [phone]  If 7pm- 7am, please page neurology on call as listed in [place].

## 2016-04-12 DIAGNOSIS — Z515 Encounter for palliative care: Secondary | ICD-10-CM

## 2016-04-12 MED ORDER — GLYCOPYRROLATE 0.2 MG/ML IJ SOLN
0.4000 mg | INTRAMUSCULAR | Status: DC | PRN
  Administered 2016-04-12 – 2016-04-13 (×3): 0.4 mg via INTRAVENOUS
  Filled 2016-04-12 (×5): qty 2

## 2016-04-12 MED ORDER — FENTANYL CITRATE (PF) 100 MCG/2ML IJ SOLN
12.5000 ug | Freq: Four times a day (QID) | INTRAMUSCULAR | Status: DC
  Administered 2016-04-12 – 2016-04-13 (×5): 12.5 ug via INTRAVENOUS
  Filled 2016-04-12 (×5): qty 2

## 2016-04-12 MED ORDER — LORAZEPAM 2 MG/ML IJ SOLN
0.5000 mg | Freq: Four times a day (QID) | INTRAMUSCULAR | Status: DC
  Administered 2016-04-12 – 2016-04-13 (×4): 0.5 mg via INTRAVENOUS
  Filled 2016-04-12 (×5): qty 1

## 2016-04-12 MED ORDER — RIVAROXABAN 15 MG PO TABS: 15.0000 mg | ORAL_TABLET | Freq: Every day | ORAL | Status: DC

## 2016-04-12 NOTE — Progress Notes (Signed)
CSW received referral for residential hospice placement.  CSW visited pt room. No family at bedside. Pt unable to participate in assessment.   CSW contacted pt nephew, Lestine Mount via telephone and left message.  Per palliative care NP, pt is a pt of [facility] and palliative care NP was able to speak with pt nephew earlier today and pt nephew expressed interest in [facility] given pt is a pt of [facility].  CSW notified [employer], Amy. No bed availability today, but pt meets eligibility criteria to transition to [facility] when bed available.  Weekday CSW to follow up with [facility] and pt nephew on Monday.  Alison Murray, MSW, LCSW Clinical Social Worker Weekend coverage [phone]

## 2016-04-12 NOTE — Progress Notes (Addendum)
Daily Progress Note   Patient Name: Bethany [name]       Date: 04/12/2016 DOB: [birth year]  Age: 83 y.o. MRN#: [id number] Attending Physician: Velvet Bathe, MD Primary Care Physician: [name], MD Admit Date: 04/11/2016  Reason for Consultation/Follow-up: Inpatient hospice referral, Non pain symptom management and Pain control  Subjective: Pt appears in distress. Not in resp distress but do see intermittent jerking of LUE. No other abnormal movements noted. She is unable to sound words which is an acute change per her nephew/caregiver.m She is moaning.  Length of Stay: 1  Current Medications: Scheduled Meds:  . fentaNYL (SUBLIMAZE) injection  12.5 mcg Intravenous Q6H  . levETIRAcetam  500 mg Intravenous Q12H  . LORazepam  0.5 mg Intravenous Q6H  . mouth rinse  15 mL Mouth Rinse BID  . rivaroxaban  20 mg Oral Q supper  . sodium chloride flush  3 mL Intravenous Q12H    Continuous Infusions:   PRN Meds: fentaNYL (SUBLIMAZE) injection, glycopyrrolate, LORazepam, LORazepam, ondansetron **OR** ondansetron (ZOFRAN) IV  Physical Exam  Constitutional:  Cachetic acutely ill female. Appears in distress, moaning  HENT:  Head: Normocephalic and atraumatic.  Pulmonary/Chest:  shallow  Neurological:  Her eyes are open she is non-verbal Left arm is jerking but no other involuntary movements noted   Skin:  cool  Psychiatric:  Appears distressed  Nursing note and vitals reviewed.           Vital Signs: BP (!) 143/65 (BP Location: Right Arm)   Pulse (!) 148   Temp 97.7 F (36.5 C) (Oral)   Resp (!) 24   Ht 5\' 5"  (1.651 m)   Wt 65.3 kg (143 lb 15.4 oz)   SpO2 100%   BMI 23.96 kg/m  SpO2: SpO2: 100 % O2 Device: O2 Device: Not Delivered O2 Flow Rate:    Intake/output summary:    Intake/Output Summary (Last 24 hours) at 04/12/16 0935 Last data filed at 04/12/16 0407  Gross per 24 hour  Intake            615.5 ml  Output             1150 ml  Net           -534.5 ml   LBM: Last BM Date: 04/11/16 Baseline Weight: Weight: 63.5 kg (  139 lb 15.9 oz) Most recent weight: Weight: 65.3 kg (143 lb 15.4 oz)       Palliative Assessment/Data:    Flowsheet Rows   Flowsheet Row Most Recent Value  Intake Tab  Referral Department  Hospitalist  Unit at Time of Referral  ICU  Palliative Care Primary Diagnosis  Cancer  Date Notified  04/11/16  Palliative Care Type  New Palliative care  Reason for referral  Pain, Non-pain Symptom, Psychosocial or Spiritual support  Date of Admission  04/11/16  Date first seen by Palliative Care  04/11/16  # of days Palliative referral response time  0 Day(s)  # of days IP prior to Palliative referral  0  Clinical Assessment  Palliative Performance Scale Score  20%  Pain Max last 24 hours  Not able to report  Pain Min Last 24 hours  Not able to report  Dyspnea Max Last 24 Hours  Not able to report  Dyspnea Min Last 24 hours  Not able to report  Nausea Max Last 24 Hours  Not able to report  Nausea Min Last 24 Hours  Not able to report  Anxiety Max Last 24 Hours  Not able to report  Other Max Last 24 Hours  Not able to report  Psychosocial & Spiritual Assessment  Palliative Care Outcomes  Patient/Family meeting held?  Yes  Who was at the meeting?  nephew, caregiver  Palliative Care Outcomes  Improved pain interventions, Clarified goals of care, Improved non-pain symptom therapy, Provided end of life care assistance, Provided psychosocial or spiritual support, Changed to focus on comfort  Patient/Family wishes: Interventions discontinued/not started   Mechanical Ventilation, BiPAP, Hemodialysis, Transfusion, Vasopressors, Trach, NIPPV, Tube feedings/TPN, Transfer out of ICU, PEG  Palliative Care follow-up planned  Yes, Facility       Patient Active Problem List   Diagnosis Date Noted  . Complex partial status epilepticus ([place]) 04/11/2016  . Coagulopathy ([place]) 04/11/2016  . Status epilepticus ([place]) 04/11/2016  . Pressure injury of skin 04/11/2016  . Goals of care, counseling/discussion 02/20/2016  . C. difficile colitis 07/31/2015  . Generalized weakness   . Hypokalemia 05/18/2014  . Hypomagnesemia 05/18/2014  . Dysphonia, spasmodic 05/18/2014  . Rectal cancer metastasized to lung ([place]) 05/17/2014  . Lumbar radicular pain 03/15/2014  . Anxiety and depression   . Atrial fibrillation ([place]) 12/08/2013  . Anemia in neoplastic disease 12/08/2013  . Macrocytic anemia 12/04/2013  . Rash and nonspecific skin eruption 12/04/2013  . Protein-calorie malnutrition, moderate ([place]) 11/29/2013  . Hypothyroidism 11/28/2013  . Rectal cancer s/p LAResectin/colostomy 05/17/2014 10/11/2013  . Ovarian mass, right, s/p robotic excision 05/17/2014 04/14/2012  . History of pulmonary embolus (PE) 01/01/2012  . Gastroesophageal reflux disease 01/01/2012  . Hypertension 01/01/2012  . Dyslipidemia 01/01/2012    Palliative Care Assessment & Plan   Patient Profile: 83 y.o. female  with past medical history of Atrial fibrillation, history of PE, hypertension, hypothyroidism, depression, anxiety, rectal cancer with metastatic disease to liver and lung now under the care of hospice and palliative care of [facility] admitted on 04/11/2016 with new onset of left-sided tremors and involuntary contractions. Per nephew who is her caregiver states it dramatically worsened over the past several days and ultimately she developed a fixed leftward gaze, and stopped responding. She was given Keppra in the emergency room. Question if patient is having seizures. Hospice and palliative care of [facility] where that she is in the hospital..    Assessment: Spoke with nephew. He is interested in  [employer]. I did share with him that she appears to be at EOL  and he concurred  Recommendations/Plan:  Pain: Cont with fentanyl for now 2/2 morphine as a listed allergy ( n/v). Will schedule dosing  Anxiety: Will schedule ativan  Involuntary movements: Cont keppra as sz prophylaxis and will schedule ativan  Secretions: Will add robinul PRN. Monitor for need for scheduled dosing.  [facility] notified of family's desire to transfer to [facility]. They hope to have a bed by tomorrow. SW consult placed. Offered choice when speaking to pt's nephew as to in-patient options  Goals of Care and Additional Recommendations:  Limitations on Scope of Treatment: Full Comfort Care  Code Status:    Code Status Orders        Start     Ordered   04/11/16 0345  Do not attempt resuscitation (DNR)  Continuous    Question Answer Comment  In the event of cardiac or respiratory ARREST Do not call a "code blue"   In the event of cardiac or respiratory ARREST Do not perform Intubation, CPR, defibrillation or ACLS   In the event of cardiac or respiratory ARREST Use medication by any route, position, wound care, and other measures to relive pain and suffering. May use oxygen, suction and manual treatment of airway obstruction as needed for comfort.      04/11/16 0348    Code Status History    Date Active Date Inactive Code Status Order ID Comments User Context   04/11/2016  3:25 AM 04/11/2016  3:48 AM DNR [id number]  Vianne Bulls, MD ED   07/31/2015  3:07 PM 08/12/2015  7:00 PM DNR [id number]  Ivor Costa, MD ED   05/17/2014  2:55 PM 05/23/2014  1:21 PM Full Code [id number]  Michael Boston, MD Inpatient   01/23/2014  2:29 AM 01/25/2014  9:33 PM DNR [id number]  Jeryl Columbia, NP Inpatient   01/23/2014 12:46 AM 01/23/2014  2:29 AM Full Code [id number]  Rise Patience, MD Inpatient   01/15/2014  4:19 PM 01/23/2014 12:46 AM DNR [id number]  Hennie Duos, MD Outpatient   11/29/2013  8:04 AM 12/01/2013  8:12 PM DNR [id number]  Cristal Ford, DO Inpatient   11/28/2013 10:38 PM  11/29/2013  8:04 AM Full Code [id number]  Theodis Blaze, MD ED   01/01/2012  7:59 PM 01/03/2012 11:55 PM Full Code [id number]  Lasandra Beech, RN Inpatient    Advance Directive Documentation   Flowsheet Row Most Recent Value  Type of Advance Directive  Out of facility DNR (pink MOST or yellow form)  Pre-existing out of facility DNR order (yellow form or pink MOST form)  No data  "MOST" Form in Place?  No data       Prognosis:   < 2 weeks in the setting of metastatic rectal adenocarcinoma with mets to liver and lung with acute changes as reflected by new onset of sz/involuntary movements and inability to speak  Discharge Planning:  Hospice facility  Care plan was discussed with [facility] and SW  Thank you for allowing the Palliative Medicine Team to assist in the care of this patient.   Time In: 0900 Time Out: 0940 Total Time 40 min Prolonged Time Billed  no       Greater than 50%  of this time was spent counseling and coordinating care related to the above assessment and plan.  Dory Horn, NP  Please contact Palliative Medicine Team phone at [phone] for questions and  concerns.

## 2016-04-12 NOTE — Progress Notes (Signed)
Pt looks comfortable in nad. Agree with poor prognosis and that patient is at EOL care. Will transition to [facility] (residential hospice) once bed available.  Social worker aware.  Palliative managing case currently  Velvet Bathe  Will reassess next am.

## 2016-04-12 NOTE — Progress Notes (Signed)
[facility] Liaison:  RN  Spoke with Romona Curls, NP, PMT, and Hughes Better, CSW who asked for [facility] for patient.  No beds available today.  CSW aware.  I have put through referral and patient is eligible per Dr. Karie Georges and Levada Dy, RN.  Asked nephew if we could do paperwork today and he advised "would rather do tomorrow".   Lestine Mount will be here tomorrow to do paperwork at 1100am.  Will update CSW on bed availability in the morning.    Thank you for the referral.  Edyth Gunnels, RN, [facility] Liaison [phone]  All hospital liaison's are now on [place].

## 2016-04-12 NOTE — Progress Notes (Signed)
[facility] Liaison: GIP visit  This is a covered and related GIP admission from 04/11/16 to [facility] diagnosis of Rectal cancer (C20) with liver mets (C78.7), lung mets (C78.0) and peritoneal mets (C78.6) per Dr. Karie Georges.  Patient is DNR.  Patient's nephew had noticed patient having AMS, L sided jerking, and patient couldn't stand.  Lestine Mount, nephew, called [facility] and talked with RN, who agreed that he needed to initiate EMS.   EMS transported patient to [facility] Emergency Department.   Patient was subsequently admitted to room [facility] 3M03-01, then moved to 6N17-01.  Visited patient in room, with nephew, Lestine Mount at bedside.  Romona Curls, NP had already talked with nephew about transferring patient to [facility].  He agrees that patient should be placed there.  Patient is non-arousable at this time and appears in NAD.   Patient is resting comfortably.   Per Bullard, NP, patient is full comfort care and no additional tests and such while in the hospital.    Patient receiving fentaNYL (SUBLIMAZE) inj 12.5 mcg, Dose: 12.5 mcg Q6H via IV; levETIRAcetam (KEPPRA) 500 mg in sodium chloride 0.9% 100 mL IVPB, Dose 500 mg Q12H via IV; LORazepam (ATIVAN) inj 0.5 mg, Dose 0.5 mg Q6H via IV. There are no continuous medications at this time.  Patient received PRN medications this morning:  fentaNYL (SUBLIMAZE) inj 12.5-25 mcg, Dose: 2mcg via IV given at 0103am and 0925am; LORazepam (ATIVAN) inj 0.5 mg, Dose 0.5 mg via IV given at 0103am and 0925am.   [facility] will continue to follow daily and anticipate discharge needs.  Thank you,  Edyth Gunnels, RN, [facility] Liaison [phone]  All hospital liaison's are now on [place].

## 2016-04-13 LAB — POCT I-STAT TROPONIN I: Troponin i, poc: 0 ng/mL (ref 0.00–0.08)

## 2016-04-13 MED ORDER — FENTANYL CITRATE (PF) 100 MCG/2ML IJ SOLN: 12.5000 ug | Freq: Four times a day (QID) | INTRAMUSCULAR | 0 refills | Status: AC

## 2016-04-13 MED ORDER — SODIUM CHLORIDE 0.9 % IV SOLN: 500.0000 mg | Freq: Two times a day (BID) | INTRAVENOUS | Status: AC

## 2016-04-13 MED ORDER — LORAZEPAM 2 MG/ML IJ SOLN: 0.5000 mg | INTRAMUSCULAR | 0 refills | Status: AC | PRN

## 2016-04-13 NOTE — Progress Notes (Signed)
Pt discharged to [facility] via [place].  Pt on RA.  Pt resting well only responding to pain.  IV to Lt FA removed prior to transportation.  Report called and given to [facility].  Nephew at bedside and aware of transport.

## 2016-04-13 NOTE — Progress Notes (Signed)
Nutrition Brief Note  Chart reviewed. Pt now transitioning to comfort care.  No further nutrition interventions warranted at this time.  Please re-consult as needed.   Shaniqwa Horsman A. Tahir Blank, RD, LDN, CDE Pager: [phone] After hours Pager: [phone]  

## 2016-04-13 NOTE — Consult Note (Signed)
           [facility] CM Primary Care Navigator  04/13/2016  Bethany [name] January 20, 1929 [id number]   Seen patient at the bedside to identify possible discharge needs but her eyes are closed and unable to participate with conversation.  Her over all condition had rapidly deteriorated and is now currently transitioned to hospice for comfort care measures per MD notes.  Discharge plan is for patient to transfer to [facility] today.   For questions, please contact:  Dannielle Huh, BSN, RN- [facility] Primary Care Navigator  Telephone: [phone] [place]

## 2016-04-13 NOTE — Discharge Summary (Signed)
Physician Discharge Summary  Bethany [name] [id number] DOB: 10/02/1928 DOA: 04/11/2016  PCP: [name], MD  Admit date: 04/11/2016 Discharge date: 04/13/2016  Time spent: > 35 minutes  Recommendations for Outpatient Follow-up:  1. Please continue comfort care measures   Discharge Diagnoses:  Principal Problem:   Complex partial status epilepticus ([place]) Active Problems:   History of pulmonary embolus (PE)   Hypertension   Hypothyroidism   Atrial fibrillation (HCC)   Anemia in neoplastic disease   Anxiety and depression   Rectal cancer metastasized to lung ([place])   Hypomagnesemia   Coagulopathy ([place])   Status epilepticus ([place])   Pressure injury of skin   Palliative care encounter   Discharge Condition: guarded  Diet recommendation: comfort feeds  Filed Weights   04/11/16 0500 04/11/16 0800 04/11/16 2328  Weight: 63.5 kg (139 lb 15.9 oz) 63.5 kg (139 lb 15.9 oz) 65.3 kg (143 lb 15.4 oz)    History of present illness:  83 y.o. female with medical history significant for paroxysmal atrial fibrillation, history of PE, hypertension, hypothyroidism, depression, anxiety, and rectal cancer with metastases to the liver and lung now under hospice care, presenting to the emergency department with a few days of left sided tremors and involuntary contractions. Diagnosed with new onset seizures.  Hospital Course:  1. Complex partial status epilepticus  - started on Keppra in house. Overall condition has deteriorated And patient is currently in a be transitioned to hospice for comfort care measures  2. Rectal cancer with metastases   - Continue hospice services. Patient transitioning to residential hospice  3. Atrial fibrillation  -Plan is for comfort care measures  Procedures:  None  Consultations:  Neurology  Discharge Exam: Vitals:   04/12/16 2023 04/13/16 0518  BP: 139/67 127/78  Pulse: (!) 102 99  Resp: (!) 26 (!) 26  Temp: 98.5 F (36.9 C) 98.5 F (36.9 C)     General: Patient resting comfortably, in no acute distress Cardiovascular: No cyanosis Respiratory: No wheezes, equal chest rise  Discharge Instructions   Discharge Instructions    Call MD for:  persistant nausea and vomiting    Complete by:  As directed    Call MD for:  severe uncontrolled pain    Complete by:  As directed    Diet - low sodium heart healthy    Complete by:  As directed    Discharge instructions    Complete by:  As directed    Continue comfort care measures   Increase activity slowly    Complete by:  As directed      Current Discharge Medication List    START taking these medications   Details  fentaNYL (SUBLIMAZE) 100 MCG/2ML injection Inject 0.25 mLs (12.5 mcg total) into the vein every 6 (six) hours. Qty: 2 mL, Refills: 0    levETIRAcetam 500 mg in sodium chloride 0.9 % 100 mL Inject 500 mg into the vein every 12 (twelve) hours.    LORazepam (ATIVAN) 2 MG/ML injection Inject 0.25 mLs (0.5 mg total) into the vein every 4 (four) hours as needed for anxiety. Qty: 1 mL, Refills: 0      STOP taking these medications     acetaminophen (TYLENOL) 325 MG tablet      cholecalciferol (VITAMIN D) 1000 UNITS tablet      folic acid (FOLVITE) [id number] MCG tablet      gabapentin (NEURONTIN) 100 MG capsule      levothyroxine (SYNTHROID, LEVOTHROID) 25 MCG tablet  levothyroxine (SYNTHROID, LEVOTHROID) 75 MCG tablet      magnesium chloride (SLOW-MAG) 64 MG TBEC SR tablet      ondansetron (ZOFRAN) 8 MG tablet      PEDIALYTE (PEDIALYTE) SOLN      Probiotic Product (RISA-BID PROBIOTIC PO)      prochlorperazine (COMPAZINE) 10 MG tablet      rivaroxaban (XARELTO) 20 MG TABS tablet      saccharomyces boulardii (FLORASTOR) 250 MG capsule      vitamin B-12 (CYANOCOBALAMIN) 1000 MCG tablet        Allergies  Allergen Reactions  . Cataflam [Diclofenac] Other (See Comments)    Unknown; patient is unaware of allergy.  . Cephalosporins Shortness Of Breath   . Erythromycin Shortness Of Breath  . Keflex [Cephalexin] Shortness Of Breath  . Propoxyphene     Unknown rxn  . Codeine Nausea And Vomiting  . Naproxen Other (See Comments)    Unknown; patient is unaware of allergy  . Penicillins Other (See Comments)    Patient states that she CAN take this; unaware of allergy, pt/family is unable to answer penicillin related questions as they do not recall this allergy reaction at all  . Synalgos-Dc [Aspirin-Caff-Dihydrocodeine] Other (See Comments)    Unknown; patient is unaware of allergy  . Ultram [Tramadol] Other (See Comments)    Unknown; patient is unaware of allergy      The results of significant diagnostics from this hospitalization (including imaging, microbiology, ancillary and laboratory) are listed below for reference.    Significant Diagnostic Studies: Ct Head Code Stroke W/o Cm  Result Date: 04/11/2016 CLINICAL DATA:  Code stroke.  Left-sided weakness EXAM: CT HEAD WITHOUT CONTRAST TECHNIQUE: Contiguous axial images were obtained from the base of the skull through the vertex without intravenous contrast. COMPARISON:  Head CT 01/22/2014 FINDINGS: Brain: There is extensive confluent hypoattenuation within the periventricular, deep and subcortical white matter. Gray-white differentiation is maintained. The basal ganglia and insular ribbons are preserved. There is no acute hemorrhage. No midline shift or mass effect. The white matter findings have progressed substantially compared to the study of 01/22/2014. Vascular: No hyperdense vessel or unexpected calcification. Skull: Normal. Negative for fracture or focal lesion. Sinuses/Orbits: No acute finding. Other: None. ASPECTS [facility] Stroke Program Early CT Score) - Ganglionic level infarction (caudate, lentiform nuclei, internal capsule, insula, M1-M3 cortex): 7 - Supraganglionic infarction (M4-M6 cortex): 3 Total score (0-10 with 10 being normal): 10 IMPRESSION: 1. No acute intracranial  hemorrhage. 2. Confluent bilateral white matter hypoattenuation without CT evidence for acute cortical infarct. The degree of white matter disease has progressed compared to the prior study. While this is favored to indicate chronic microvascular ischemia, PRES may have the same appearance. 3. ASPECTS is 10. These results were called by telephone at the time of interpretation on 04/11/2016 at 2:09 am to Dr. Kerney Elbe, who verbally acknowledged these results. Electronically Signed   By: Ulyses Jarred M.D.   On: 04/11/2016 02:10    Microbiology: Recent Results (from the past 240 hour(s))  MRSA PCR Screening     Status: None   Collection Time: 04/11/16  5:03 AM  Result Value Ref Range Status   MRSA by PCR NEGATIVE NEGATIVE Final    Comment:        The GeneXpert MRSA Assay (FDA approved for NASAL specimens only), is one component of a comprehensive MRSA colonization surveillance program. It is not intended to diagnose MRSA infection nor to guide or monitor treatment for MRSA infections.  Labs: Basic Metabolic Panel:  Recent Labs Lab 04/11/16 0128 04/11/16 0135 04/11/16 0204  NA 137 136  --   K 3.9 3.9  --   CL 99* 98*  --   CO2 27  --   --   GLUCOSE 114* 112*  --   BUN 14 16  --   CREATININE 0.75 0.70  --   CALCIUM 9.1  --   --   MG  --   --  1.5*   Liver Function Tests:  Recent Labs Lab 04/11/16 0128  AST 35  ALT 16  ALKPHOS 265*  BILITOT 0.7  PROT 5.7*  ALBUMIN 2.6*   No results for input(s): LIPASE, AMYLASE in the last 168 hours. No results for input(s): AMMONIA in the last 168 hours. CBC:  Recent Labs Lab 04/11/16 0128 04/11/16 0135  WBC 9.5  --   NEUTROABS 8.0*  --   HGB 10.4* 10.2*  HCT 32.7* 30.0*  MCV 105.1*  --   PLT 222  --    Cardiac Enzymes: No results for input(s): CKTOTAL, CKMB, CKMBINDEX, TROPONINI in the last 168 hours. BNP: BNP (last 3 results) No results for input(s): BNP in the last 8760 hours.  ProBNP (last 3 results) No  results for input(s): PROBNP in the last 8760 hours.  CBG:  Recent Labs Lab 04/11/16 0152 04/11/16 0750  GLUCAP 106* 77    Signed:  Velvet Bathe MD.  [facility] 04/13/2016, 12:12 PM

## 2016-04-13 NOTE — Progress Notes (Signed)
Home Care SW and RN made visit to Patient's room.  Nephew was present during visit.  He shared how Patient came to the hospital and her rapid decline.  SW discussed [employer] and let him know there was a bed today.  SW completed the financial assessment with him and provided a copy to him.  SW will follow up with Patient tomorrow once she gets to [facility].  Cecilio Asper, LCSW [facility] [phone]

## 2016-04-13 NOTE — Clinical Social Work Note (Signed)
CSW rec'd call from [facility] to inform they will be accepting pt and nephew, Lestine Mount, already completed paperwork. CSW was asked by MD if pt can go to facility on IV Keppra. CSW informed MD that, per Stacy, IV to be d/c. Pt is ready for discharge today and will go to [facility]. Nephew at bedside. Nephew is aware and agreeable to discharge plan. CSW faxed d/c summary to [facility] and rec'd report number from [place]. Report provided to RN and put in treatment team sticky note. Transportation arranged with [facility] for 3:00pm pick up. CSW is signing off as no further needs identified.  Oretha Ellis, Latanya Presser, [place] Social Worker  [phone]

## 2016-04-13 NOTE — Progress Notes (Signed)
[facility] Liaison Note:  Plan is for patient to transfer to [facility] today. Spoke to CSW, [place] to discuss plan.  Please fax discharge summary to [phone] when complete.  RN please call report to [facility] at [phone].  Thank you, Freddi Starr RN, [facility] Liaison [phone]

## 2016-04-15 ENCOUNTER — Telehealth: Payer: Self-pay | Admitting: Oncology

## 2016-04-15 ENCOUNTER — Other Ambulatory Visit: Payer: Medicare Other

## 2016-04-15 ENCOUNTER — Ambulatory Visit: Payer: Medicare Other | Admitting: Oncology

## 2016-04-15 NOTE — Telephone Encounter (Signed)
Patient's nephew called to inform center that the patient passed. Appointments for 3/7 canceled, nurse notified.

## 2016-04-17 ENCOUNTER — Telehealth: Payer: Self-pay | Admitting: Oncology

## 2016-04-17 NOTE — Telephone Encounter (Signed)
Received message from switchboard 3/7 ([place]) re patient's nephew calling in to inform Office that patient has passes away. Per [employer] message sent to FS/desk nurse. Patient chart updated.

## 2016-05-10 DEATH — deceased
